# Patient Record
Sex: Female | Born: 1950
Health system: Southern US, Community
[De-identification: ages and names within clinical notes are randomized; demographics above are authoritative.]

## PROBLEM LIST (undated history)

## (undated) DIAGNOSIS — M170 Bilateral primary osteoarthritis of knee: Secondary | ICD-10-CM

## (undated) DIAGNOSIS — N200 Calculus of kidney: Secondary | ICD-10-CM

## (undated) DIAGNOSIS — Z8719 Personal history of other diseases of the digestive system: Secondary | ICD-10-CM

## (undated) DIAGNOSIS — F32A Depression, unspecified: Secondary | ICD-10-CM

## (undated) DIAGNOSIS — Z800 Family history of malignant neoplasm of digestive organs: Secondary | ICD-10-CM

## (undated) DIAGNOSIS — M1990 Unspecified osteoarthritis, unspecified site: Secondary | ICD-10-CM

## (undated) DIAGNOSIS — E785 Hyperlipidemia, unspecified: Secondary | ICD-10-CM

## (undated) DIAGNOSIS — M8580 Other specified disorders of bone density and structure, unspecified site: Secondary | ICD-10-CM

## (undated) DIAGNOSIS — M5136 Other intervertebral disc degeneration, lumbar region: Secondary | ICD-10-CM

## (undated) DIAGNOSIS — J309 Allergic rhinitis, unspecified: Secondary | ICD-10-CM

## (undated) DIAGNOSIS — Z8711 Personal history of peptic ulcer disease: Secondary | ICD-10-CM

## (undated) DIAGNOSIS — F329 Major depressive disorder, single episode, unspecified: Secondary | ICD-10-CM

## (undated) DIAGNOSIS — M5030 Other cervical disc degeneration, unspecified cervical region: Secondary | ICD-10-CM

## (undated) DIAGNOSIS — R7303 Prediabetes: Secondary | ICD-10-CM

## (undated) DIAGNOSIS — R569 Unspecified convulsions: Secondary | ICD-10-CM

## (undated) DIAGNOSIS — E559 Vitamin D deficiency, unspecified: Secondary | ICD-10-CM

## (undated) DIAGNOSIS — T7840XA Allergy, unspecified, initial encounter: Secondary | ICD-10-CM

## (undated) DIAGNOSIS — D126 Benign neoplasm of colon, unspecified: Secondary | ICD-10-CM

## (undated) DIAGNOSIS — Z808 Family history of malignant neoplasm of other organs or systems: Secondary | ICD-10-CM

## (undated) DIAGNOSIS — G43909 Migraine, unspecified, not intractable, without status migrainosus: Secondary | ICD-10-CM

## (undated) DIAGNOSIS — K3532 Acute appendicitis with perforation and localized peritonitis, without abscess: Secondary | ICD-10-CM

## (undated) DIAGNOSIS — F17200 Nicotine dependence, unspecified, uncomplicated: Secondary | ICD-10-CM

## (undated) DIAGNOSIS — K219 Gastro-esophageal reflux disease without esophagitis: Secondary | ICD-10-CM

## (undated) DIAGNOSIS — M51369 Other intervertebral disc degeneration, lumbar region without mention of lumbar back pain or lower extremity pain: Secondary | ICD-10-CM

## (undated) DIAGNOSIS — C3490 Malignant neoplasm of unspecified part of unspecified bronchus or lung: Secondary | ICD-10-CM

## (undated) DIAGNOSIS — I2510 Atherosclerotic heart disease of native coronary artery without angina pectoris: Secondary | ICD-10-CM

## (undated) DIAGNOSIS — J449 Chronic obstructive pulmonary disease, unspecified: Secondary | ICD-10-CM

## (undated) DIAGNOSIS — J189 Pneumonia, unspecified organism: Secondary | ICD-10-CM

## (undated) DIAGNOSIS — D649 Anemia, unspecified: Secondary | ICD-10-CM

## (undated) DIAGNOSIS — Z8489 Family history of other specified conditions: Secondary | ICD-10-CM

## (undated) DIAGNOSIS — J42 Unspecified chronic bronchitis: Secondary | ICD-10-CM

## (undated) DIAGNOSIS — Z803 Family history of malignant neoplasm of breast: Secondary | ICD-10-CM

## (undated) HISTORY — DX: Allergy, unspecified, initial encounter: T78.40XA

## (undated) HISTORY — PX: POLYPECTOMY: SHX149

## (undated) HISTORY — DX: Major depressive disorder, single episode, unspecified: F32.9

## (undated) HISTORY — DX: Allergic rhinitis, unspecified: J30.9

## (undated) HISTORY — DX: Benign neoplasm of colon, unspecified: D12.6

## (undated) HISTORY — DX: Family history of malignant neoplasm of breast: Z80.3

## (undated) HISTORY — DX: Hyperlipidemia, unspecified: E78.5

## (undated) HISTORY — DX: Unspecified osteoarthritis, unspecified site: M19.90

## (undated) HISTORY — DX: Family history of malignant neoplasm of other organs or systems: Z80.8

## (undated) HISTORY — DX: Anemia, unspecified: D64.9

## (undated) HISTORY — DX: Other cervical disc degeneration, unspecified cervical region: M50.30

## (undated) HISTORY — DX: Acute appendicitis with perforation and localized peritonitis, without abscess: K35.32

## (undated) HISTORY — PX: COLONOSCOPY: SHX174

## (undated) HISTORY — DX: Family history of malignant neoplasm of digestive organs: Z80.0

## (undated) HISTORY — DX: Other intervertebral disc degeneration, lumbar region without mention of lumbar back pain or lower extremity pain: M51.369

## (undated) HISTORY — DX: Chronic obstructive pulmonary disease, unspecified: J44.9

## (undated) HISTORY — DX: Other specified disorders of bone density and structure, unspecified site: M85.80

## (undated) HISTORY — DX: Nicotine dependence, unspecified, uncomplicated: F17.200

## (undated) HISTORY — DX: Vitamin D deficiency, unspecified: E55.9

## (undated) HISTORY — DX: Depression, unspecified: F32.A

## (undated) HISTORY — DX: Other intervertebral disc degeneration, lumbar region: M51.36

## (undated) HISTORY — DX: Prediabetes: R73.03

## (undated) HISTORY — PX: APPENDECTOMY: SHX54

---

## 1976-07-28 HISTORY — PX: VAGINAL HYSTERECTOMY: SUR661

## 1997-07-28 HISTORY — PX: SALPINGOOPHORECTOMY: SHX82

## 1999-05-07 ENCOUNTER — Other Ambulatory Visit: Admission: RE | Admit: 1999-05-07 | Discharge: 1999-05-07 | Payer: Self-pay | Admitting: *Deleted

## 2000-05-25 ENCOUNTER — Other Ambulatory Visit: Admission: RE | Admit: 2000-05-25 | Discharge: 2000-05-25 | Payer: Self-pay | Admitting: *Deleted

## 2001-05-31 ENCOUNTER — Other Ambulatory Visit: Admission: RE | Admit: 2001-05-31 | Discharge: 2001-05-31 | Payer: Self-pay | Admitting: *Deleted

## 2001-07-28 HISTORY — PX: SHOULDER ARTHROSCOPY: SHX128

## 2002-06-07 ENCOUNTER — Other Ambulatory Visit: Admission: RE | Admit: 2002-06-07 | Discharge: 2002-06-07 | Payer: Self-pay | Admitting: *Deleted

## 2003-06-06 ENCOUNTER — Other Ambulatory Visit: Admission: RE | Admit: 2003-06-06 | Discharge: 2003-06-06 | Payer: Self-pay | Admitting: *Deleted

## 2004-07-08 ENCOUNTER — Other Ambulatory Visit: Admission: RE | Admit: 2004-07-08 | Discharge: 2004-07-08 | Payer: Self-pay | Admitting: *Deleted

## 2004-11-05 ENCOUNTER — Ambulatory Visit: Payer: Self-pay | Admitting: Gastroenterology

## 2004-11-06 ENCOUNTER — Ambulatory Visit: Payer: Self-pay | Admitting: Gastroenterology

## 2005-08-20 ENCOUNTER — Other Ambulatory Visit: Admission: RE | Admit: 2005-08-20 | Discharge: 2005-08-20 | Payer: Self-pay | Admitting: *Deleted

## 2006-09-01 ENCOUNTER — Other Ambulatory Visit: Admission: RE | Admit: 2006-09-01 | Discharge: 2006-09-01 | Payer: Self-pay | Admitting: *Deleted

## 2007-11-01 ENCOUNTER — Ambulatory Visit (HOSPITAL_COMMUNITY): Admission: RE | Admit: 2007-11-01 | Discharge: 2007-11-01 | Payer: Self-pay | Admitting: Internal Medicine

## 2007-12-07 ENCOUNTER — Other Ambulatory Visit: Admission: RE | Admit: 2007-12-07 | Discharge: 2007-12-07 | Payer: Self-pay | Admitting: Gynecology

## 2008-10-31 ENCOUNTER — Encounter: Admission: RE | Admit: 2008-10-31 | Discharge: 2008-10-31 | Payer: Self-pay | Admitting: Internal Medicine

## 2009-10-03 ENCOUNTER — Encounter (INDEPENDENT_AMBULATORY_CARE_PROVIDER_SITE_OTHER): Payer: Self-pay | Admitting: *Deleted

## 2009-10-26 HISTORY — PX: TENDON REPAIR: SHX5111

## 2010-03-27 ENCOUNTER — Ambulatory Visit (HOSPITAL_COMMUNITY): Admission: RE | Admit: 2010-03-27 | Discharge: 2010-03-27 | Payer: Self-pay | Admitting: Internal Medicine

## 2010-06-26 ENCOUNTER — Ambulatory Visit (HOSPITAL_COMMUNITY)
Admission: RE | Admit: 2010-06-26 | Discharge: 2010-06-26 | Payer: Self-pay | Source: Home / Self Care | Admitting: Internal Medicine

## 2010-07-28 HISTORY — PX: SHOULDER ARTHROSCOPY W/ ROTATOR CUFF REPAIR: SHX2400

## 2010-08-27 NOTE — Letter (Signed)
Summary: Colonoscopy Date Change Letter  [facility]  [address] [place], [place] [address]   Phone: [phone]  Fax: [phone]      October 03, 2009 MRN: [id number]   [facility] [address] [place], [place]  [address]   Dear Ms. Raffel,   Previously you were recommended to have a repeat colonoscopy around this time. Your chart was recently reviewed by Dr. Sheryn Bison of [facility]. Follow up colonoscopy is now recommended in __. This revised recommendation is based on current, nationally recognized guidelines for colorectal cancer screening and polyp surveillance. These guidelines are endorsed by the American Cancer Society, [employer] on Colorectal Cancer as well as numerous other major medical organizations.  Please understand that our recommendation assumes that you do not have any new symptoms such as bleeding, a change in bowel habits, anemia, or significant abdominal discomfort. If you do have any concerning GI symptoms or want to discuss the guideline recommendations, please call to arrange an office visit at your earliest convenience. Otherwise we will keep you in our reminder system and contact you 1-2 months prior to the date listed above to schedule your next colonoscopy.  Thank you,   Vania Rea. Jarold Motto, M.D.  [facility] [phone]

## 2010-08-27 NOTE — Letter (Signed)
Summary: Colonoscopy Date Change Letter  [facility]  [address] [place], [place] [address]   Phone: [phone]  Fax: [phone]      October 03, 2009 MRN: [id number]   [facility] [address] [place], [place]  [address]   Dear [name],   Previously you were recommended to have a repeat colonoscopy around this time. Your chart was recently reviewed by Dr. Jarold Motto of [facility]. Follow up colonoscopy is now recommended in April 2016. This revised recommendation is based on current, nationally recognized guidelines for colorectal cancer screening and polyp surveillance. These guidelines are endorsed by the American Cancer Society, [employer] on Colorectal Cancer as well as numerous other major medical organizations.  Please understand that our recommendation assumes that you do not have any new symptoms such as bleeding, a change in bowel habits, anemia, or significant abdominal discomfort. If you do have any concerning GI symptoms or want to discuss the guideline recommendations, please call to arrange an office visit at your earliest convenience. Otherwise we will keep you in our reminder system and contact you 1-2 months prior to the date listed above to schedule your next colonoscopy.  Thank you,   Karen Barrera. Jarold Motto, M.D.  [facility] [phone]

## 2011-04-29 ENCOUNTER — Other Ambulatory Visit: Payer: Self-pay | Admitting: Physician Assistant

## 2012-04-20 ENCOUNTER — Ambulatory Visit (HOSPITAL_COMMUNITY)
Admission: RE | Admit: 2012-04-20 | Discharge: 2012-04-20 | Disposition: A | Discharge status: Home / Self Care | Payer: 59 | Source: Ambulatory Visit | Attending: Internal Medicine | Admitting: Internal Medicine

## 2012-04-20 ENCOUNTER — Other Ambulatory Visit (HOSPITAL_COMMUNITY): Payer: Self-pay | Admitting: Internal Medicine

## 2012-04-20 DIAGNOSIS — R059 Cough, unspecified: Secondary | ICD-10-CM

## 2012-04-20 DIAGNOSIS — R0989 Other specified symptoms and signs involving the circulatory and respiratory systems: Secondary | ICD-10-CM | POA: Insufficient documentation

## 2012-04-20 DIAGNOSIS — R05 Cough: Secondary | ICD-10-CM

## 2012-04-21 ENCOUNTER — Telehealth: Payer: Self-pay | Admitting: Gastroenterology

## 2012-04-21 NOTE — Telephone Encounter (Signed)
This call was about her husband, pt Karen Barrera, [phone].

## 2012-05-03 ENCOUNTER — Telehealth: Payer: Self-pay | Admitting: Gastroenterology

## 2012-05-03 NOTE — Telephone Encounter (Signed)
Patient had colonoscopy in 11/06/2004 had multiple hyperplastic plastic. She does have a family history of colon cancer, but it was in a great aunt and great uncle.  She has family history or colon polyps but not sure what type. Please review colon and path in your folder and advise proper recall date.

## 2012-05-05 NOTE — Telephone Encounter (Signed)
Colon now is in order

## 2012-05-05 NOTE — Telephone Encounter (Signed)
Please schedule colon per Dr. Jarold Motto.

## 2012-05-10 ENCOUNTER — Encounter: Payer: Self-pay | Admitting: Gastroenterology

## 2012-05-11 ENCOUNTER — Encounter: Payer: Self-pay | Admitting: Gastroenterology

## 2012-06-14 ENCOUNTER — Encounter: Payer: Self-pay | Admitting: Gastroenterology

## 2012-06-14 ENCOUNTER — Ambulatory Visit (AMBULATORY_SURGERY_CENTER): Payer: 59 | Admitting: *Deleted

## 2012-06-14 VITALS — Ht 64.75 in | Wt 159.0 lb

## 2012-06-14 DIAGNOSIS — Z1211 Encounter for screening for malignant neoplasm of colon: Secondary | ICD-10-CM

## 2012-06-14 MED ORDER — MOVIPREP 100 G PO SOLR: ORAL | Status: DC

## 2012-06-28 ENCOUNTER — Encounter: Payer: Self-pay | Admitting: Gastroenterology

## 2012-06-28 ENCOUNTER — Ambulatory Visit (AMBULATORY_SURGERY_CENTER): Payer: 59 | Admitting: Gastroenterology

## 2012-06-28 VITALS — BP 108/58 | HR 65 | Temp 97.0°F | Resp 17 | Ht 64.0 in | Wt 159.0 lb

## 2012-06-28 DIAGNOSIS — D126 Benign neoplasm of colon, unspecified: Secondary | ICD-10-CM

## 2012-06-28 DIAGNOSIS — Z1211 Encounter for screening for malignant neoplasm of colon: Secondary | ICD-10-CM

## 2012-06-28 DIAGNOSIS — Z800 Family history of malignant neoplasm of digestive organs: Secondary | ICD-10-CM

## 2012-06-28 LAB — HM COLONOSCOPY

## 2012-06-28 MED ORDER — SODIUM CHLORIDE 0.9 % IV SOLN: 500.0000 mL | INTRAVENOUS | Status: DC

## 2012-06-28 NOTE — Patient Instructions (Addendum)
Multiple polyps found and removed.  Sent to pathology.    YOU HAD AN ENDOSCOPIC PROCEDURE TODAY AT THE [facility]: Refer to the procedure report that was given to you for any specific questions about what was found during the examination.  If the procedure report does not answer your questions, please call your gastroenterologist to clarify.  If you requested that your care partner not be given the details of your procedure findings, then the procedure report has been included in a sealed envelope for you to review at your convenience later.  YOU SHOULD EXPECT: Some feelings of bloating in the abdomen. Passage of more gas than usual.  Walking can help get rid of the air that was put into your GI tract during the procedure and reduce the bloating. If you had a lower endoscopy (such as a colonoscopy or flexible sigmoidoscopy) you may notice spotting of blood in your stool or on the toilet paper. If you underwent a bowel prep for your procedure, then you may not have a normal bowel movement for a few days.  DIET: Your first meal following the procedure should be a light meal and then it is ok to progress to your normal diet.  A half-sandwich or bowl of soup is an example of a good first meal.  Heavy or fried foods are harder to digest and may make you feel nauseous or bloated.  Likewise meals heavy in dairy and vegetables can cause extra gas to form and this can also increase the bloating.  Drink plenty of fluids but you should avoid alcoholic beverages for 24 hours.  ACTIVITY: Your care partner should take you home directly after the procedure.  You should plan to take it easy, moving slowly for the rest of the day.  You can resume normal activity the day after the procedure however you should NOT DRIVE or use heavy machinery for 24 hours (because of the sedation medicines used during the test).    SYMPTOMS TO REPORT IMMEDIATELY: A gastroenterologist can be reached at any hour.  During normal  business hours, 8:30 AM to 5:00 PM Monday through Friday, call [phone].  After hours and on weekends, please call the GI answering service at [phone] who will take a message and have the physician on call contact you.   Following lower endoscopy (colonoscopy or flexible sigmoidoscopy):  Excessive amounts of blood in the stool  Significant tenderness or worsening of abdominal pains  Swelling of the abdomen that is new, acute  Fever of 100F or higher   FOLLOW UP: If any biopsies were taken you will be contacted by phone or by letter within the next 1-3 weeks.  Call your gastroenterologist if you have not heard about the biopsies in 3 weeks.  Our staff will call the home number listed on your records the next business day following your procedure to check on you and address any questions or concerns that you may have at that time regarding the information given to you following your procedure. This is a courtesy call and so if there is no answer at the home number and we have not heard from you through the emergency physician on call, we will assume that you have returned to your regular daily activities without incident.  SIGNATURES/CONFIDENTIALITY: You and/or your care partner have signed paperwork which will be entered into your electronic medical record.  These signatures attest to the fact that that the information above on your After Visit Summary has  been reviewed and is understood.  Full responsibility of the confidentiality of this discharge information lies with you and/or your care-partner.  

## 2012-06-28 NOTE — Op Note (Signed)
[facility] [address] [place], [address]   COLONOSCOPY PROCEDURE REPORT  PATIENT: Shareka, Casale  MR#: [id number] BIRTHDATE: 1950-11-12 , 60  yrs. old GENDER: Female ENDOSCOPIST: Mardella Layman, MD, [facility] REFERRED BY: PROCEDURE DATE:  06/28/2012 PROCEDURE:   Colonoscopy with snare polypectomy ASA CLASS:   Class II INDICATIONS:Patient's family history of colon cancer, distant relatives. MEDICATIONS: propofol (Diprivan) 250mg  IV  DESCRIPTION OF PROCEDURE:   After the risks and benefits and of the procedure were explained, informed consent was obtained.  A digital rectal exam revealed no abnormalities of the rectum.    The LB [id number]  endoscope was introduced through the anus and advanced to the cecum, which was identified by both the appendix and ileocecal valve .  The quality of the prep was adequate, using MoviPrep .  The instrument was then slowly withdrawn as the colon was fully examined.     COLON FINDINGS: Multiple smooth sessile polyps ranging between 3-2mm in size were found throughout the entire examined colon.  A polypectomy was performed using snare cautery.  The resection was complete and the polyp tissue was completely retrieved.   The colon mucosa was otherwise normal.Prominant perianal papillae noted in retroflexed view.           The scope was then withdrawn from the patient and the procedure completed.  COMPLICATIONS: There were no complications. ENDOSCOPIC IMPRESSION: 1.   Multiple sessile polyps ranging between 3-38mm in size were found throughout the entire examined colon; polypectomy was performed using snare cautery 2.   The colon mucosa was otherwise normal  RECOMMENDATIONS: If the polyp(s) removed today are proven to be adenomatous (pre-cancerous) polyps, you will need a colonoscopy in 3 years. Otherwise you should continue to follow colorectal cancer screening guidelines for "routine risk" patients with a  colonoscopy in 10 years.  You will receive a letter within 1-2 weeks with the results of your biopsy as well as final recommendations.  Please call my office if you have not received a letter after 3 weeks.   REPEAT EXAM:  cc:  _______________________________ eSignedMardella Layman, MD, [facility] 06/28/2012 10:32 AM     PATIENT NAME:  Khylah, Kendra MR#: [id number]

## 2012-06-28 NOTE — Progress Notes (Signed)
Patient did not experience any of the following events: a burn prior to discharge; a fall within the facility; wrong site/side/patient/procedure/implant event; or a hospital transfer or hospital admission upon discharge from the facility. (G8907) Patient did not have preoperative order for IV antibiotic SSI prophylaxis. (G8918)  

## 2012-06-29 ENCOUNTER — Telehealth: Payer: Self-pay | Admitting: *Deleted

## 2012-06-29 NOTE — Telephone Encounter (Signed)
Message left

## 2012-07-02 ENCOUNTER — Encounter: Payer: Self-pay | Admitting: Gastroenterology

## 2012-07-05 LAB — HM MAMMOGRAPHY: HM Mammogram: NORMAL

## 2013-03-30 ENCOUNTER — Other Ambulatory Visit: Payer: Self-pay | Admitting: Physician Assistant

## 2013-06-15 ENCOUNTER — Other Ambulatory Visit: Payer: Self-pay

## 2013-06-15 DIAGNOSIS — R0602 Shortness of breath: Secondary | ICD-10-CM

## 2013-06-15 MED ORDER — ALBUTEROL SULFATE HFA 108 (90 BASE) MCG/ACT IN AERS: 2.0000 | INHALATION_SPRAY | RESPIRATORY_TRACT | Status: DC | PRN

## 2013-06-16 ENCOUNTER — Other Ambulatory Visit: Payer: 59

## 2013-06-16 ENCOUNTER — Ambulatory Visit (INDEPENDENT_AMBULATORY_CARE_PROVIDER_SITE_OTHER): Payer: 59 | Admitting: Internal Medicine

## 2013-06-16 ENCOUNTER — Encounter: Payer: Self-pay | Admitting: Internal Medicine

## 2013-06-16 ENCOUNTER — Ambulatory Visit (INDEPENDENT_AMBULATORY_CARE_PROVIDER_SITE_OTHER)
Admission: RE | Admit: 2013-06-16 | Discharge: 2013-06-16 | Disposition: A | Discharge status: Home / Self Care | Payer: 59 | Source: Ambulatory Visit | Attending: Internal Medicine | Admitting: Internal Medicine

## 2013-06-16 VITALS — BP 126/76 | HR 86 | Ht 64.25 in | Wt 167.2 lb

## 2013-06-16 DIAGNOSIS — R05 Cough: Secondary | ICD-10-CM

## 2013-06-16 DIAGNOSIS — R053 Chronic cough: Secondary | ICD-10-CM

## 2013-06-16 DIAGNOSIS — J302 Other seasonal allergic rhinitis: Secondary | ICD-10-CM

## 2013-06-16 DIAGNOSIS — R059 Cough, unspecified: Secondary | ICD-10-CM

## 2013-06-16 DIAGNOSIS — J3089 Other allergic rhinitis: Secondary | ICD-10-CM

## 2013-06-16 DIAGNOSIS — J309 Allergic rhinitis, unspecified: Secondary | ICD-10-CM

## 2013-06-16 DIAGNOSIS — J209 Acute bronchitis, unspecified: Secondary | ICD-10-CM

## 2013-06-16 NOTE — Progress Notes (Signed)
06/16/13- 43 yoF smoker referred courtesy of Dr McKeown/  Loree Fee, PA for chronic allergies.  Pt c/o congestion, intermittent sometimes prod cough with clear/yellow mucus. She describes "allergy" as rhinitis and chest congestion each year around Christmas and Easter( winter and spring). History of seasonal allergic rhinitis with itching and some sneezing each spring and fall. Denies history of asthma. Needed to see her primary physician 4 times in September. Eyes have been watering, nose running, intermittent cough sometimes productive of white or yellow sputum. Occasional mild wheeze. History of deviated septum repair.  She says when she has tried quitting smoking before she gets mood changes and weight gain. Now averaging one half pack per day. History of pneumonia with pneumonia vaccine twice. Rescue inhaler has been some help if acutely ill. She denies reflux symptoms. Family history of uncle and grand daughter with asthma. She is married, retired from an office job with some travel. CXR 04/20/12 IMPRESSION:  Stable exam. No active cardiopulmonary disease.  Original Report Authenticated By: Danae Orleans, M.D.   Prior to Admission medications   Medication Sig Start Date End Date Taking? Authorizing Provider  albuterol (PROAIR HFA) 108 (90 BASE) MCG/ACT inhaler Inhale 2 puffs into the lungs every 4 (four) hours as needed for wheezing or shortness of breath. 06/15/13  Yes Loree Fee, MD  B Complex-C (SUPER B COMPLEX PO) Take by mouth daily.   Yes Historical Provider, MD  Calcium Carbonate-Vit D-Min (GNP CALCIUM 1200 PO) Take by mouth daily.   Yes Historical Provider, MD  Cholecalciferol (VITAMIN D-3) 5000 UNITS TABS Take by mouth daily.   Yes Historical Provider, MD  co-enzyme Q-10 30 MG capsule Take 30 mg by mouth daily. Takes 200 mg daily   Yes Historical Provider, MD  estradiol (VIVELLE-DOT) 0.1 MG/24HR Place 1 patch onto the skin.   Yes Historical Provider, MD  Magnesium 300 MG CAPS  Take by mouth daily.   Yes Historical Provider, MD  montelukast (SINGULAIR) 10 MG tablet Take 10 mg by mouth at bedtime.   Yes Historical Provider, MD  rosuvastatin (CRESTOR) 5 MG tablet Take 5 mg by mouth daily.   Yes Historical Provider, MD   Past Medical History  Diagnosis Date  . Allergy   . Arthritis   . Depression   . Hyperlipidemia   . Osteopenia    Past Surgical History  Procedure Laterality Date  . Shoulder surgery  2012    left; rotator cuff  . Elbow surgery  10/2009    left/torn tendon  . Shoulder surgery  2003    right; removal spurs  . Oophorectomy  1999    right  . Partial vaginal hysterectomy  1978   Family History  Problem Relation Age of Onset  . Stomach cancer Neg Hx   . Colon cancer Other 60  . Colon cancer Other 60   History   Social History  . Marital Status: Married    Spouse Name: N/A    Number of Children: N/A  . Years of Education: N/A   Occupational History  . Not on file.   Social History Main Topics  . Smoking status: Current Every Day Smoker -- 0.50 packs/day for 40 years    Types: Cigarettes  . Smokeless tobacco: Never Used     Comment: Pt quit smoking during pregnancies, also 3 yrs ago for 6 mos.  . Alcohol Use: No  . Drug Use: No  . Sexual Activity: Not on file   Other Topics Concern  .  Not on file   Social History Narrative  . No narrative on file   ROS-see HPI Constitutional:   No-   weight loss, night sweats, fevers, chills, fatigue, lassitude. HEENT:   No-  headaches, difficulty swallowing, tooth/dental problems, sore throat,       +sneezing,no- itching, ear ache, nasal congestion, post nasal drip,  CV:  No-   chest pain, orthopnea, PND, swelling in lower extremities, anasarca,  dizziness, palpitations Resp: No-   shortness of breath with exertion or at rest.              +productive cough,  +non-productive cough,  No- coughing up of blood.              No-   change in color of mucus.  No- wheezing.   Skin: No-   rash  or lesions. GI:  No-   heartburn, indigestion, abdominal pain, nausea, vomiting, diarrhea,                 change in bowel habits, loss of appetite GU: No-   dysuria, change in color of urine, no urgency or frequency.  No- flank pain. MS:  No-   joint pain or swelling.  No- decreased range of motion.  No- back pain. Neuro-     nothing unusual Psych:  No- change in mood or affect. No depression or anxiety.  No memory loss.  OBJ- Physical Exam General- Alert, Oriented, Affect-appropriate, Distress- none acute Skin- rash-none, lesions- none, excoriation- none Lymphadenopathy- none Head- atraumatic            Eyes- Gross vision intact, PERRLA, conjunctivae and secretions clear            Ears- Hearing, canals-normal            Nose- Clear, no-Septal dev, mucus, polyps, erosion, perforation             Throat- Mallampati II , mucosa clear , drainage- none, tonsils- atrophic Neck- flexible , trachea midline, no stridor , thyroid nl, carotid no bruit Chest - symmetrical excursion , unlabored           Heart/CV- RRR , no murmur , no gallop  , no rub, nl s1 s2                           - JVD- none , edema- none, stasis changes- none, varices- none           Lung- clear to P&A, wheeze- none, cough- none , dullness-none, rub- none           Chest wall-  Abd- tender-no, distended-no, bowel sounds-present, HSM- no Br/ Gen/ Rectal- Not done, not indicated Extrem- cyanosis- none, clubbing, none, atrophy- none, strength- nl Neuro- grossly intact to observation

## 2013-06-16 NOTE — Patient Instructions (Signed)
Order- CXR                                Dx chronic cough, tobacco user  Order- schedule PFT, 6 MWT  Order- Lab- Allergy Profile     Please get serious about stopping smoking

## 2013-06-17 LAB — ALLERGY FULL PROFILE
Allergen, D pternoyssinus,d7: 0.69 kU/L — ABNORMAL HIGH
Allergen,Goose feathers, e70: <0.1 kU/L
Alternaria Alternata: <0.1 kU/L
Aspergillus fumigatus, m3: <0.1 kU/L
Bahia Grass: <0.1 kU/L
Bermuda Grass: 0.54 kU/L — ABNORMAL HIGH
Box Elder IgE: <0.1 kU/L
Candida Albicans: <0.1 kU/L
Cat Dander: <0.1 kU/L
Common Ragweed: 0.1 kU/L — ABNORMAL HIGH
Curvularia lunata: <0.1 kU/L
D. farinae: 0.64 kU/L — ABNORMAL HIGH
Dog Dander: 0.23 kU/L — ABNORMAL HIGH
Elm IgE: <0.1 kU/L
Fescue: <0.1 kU/L
G005 Rye, Perennial: <0.1 kU/L
G009 Red Top: <0.1 kU/L
Goldenrod: <0.1 kU/L
Helminthosporium halodes: <0.1 kU/L
House Dust Hollister: 0.25 kU/L — ABNORMAL HIGH
IgE (Immunoglobulin E), Serum: 41.7 IU/mL (ref 0.0–180.0)
Lamb's Quarters: <0.1 kU/L
Oak: <0.1 kU/L
Plantain: <0.1 kU/L
Stemphylium Botryosum: <0.1 kU/L
Sycamore Tree: <0.1 kU/L
Timothy Grass: <0.1 kU/L

## 2013-07-03 DIAGNOSIS — J3089 Other allergic rhinitis: Secondary | ICD-10-CM | POA: Insufficient documentation

## 2013-07-03 DIAGNOSIS — J302 Other seasonal allergic rhinitis: Secondary | ICD-10-CM | POA: Insufficient documentation

## 2013-07-03 NOTE — Assessment & Plan Note (Signed)
Plan-allergy profile 

## 2013-07-03 NOTE — Assessment & Plan Note (Addendum)
Pattern sounds like recurrent acute bronchitis and rhinitis. There may be a mixture of infections and allergy triggers. Ongoing smoking  is clearly an aggravating factor. Plan-chest x-ray, PFT, allergy profile

## 2013-07-07 ENCOUNTER — Encounter: Payer: Self-pay | Admitting: Internal Medicine

## 2013-07-11 ENCOUNTER — Ambulatory Visit (INDEPENDENT_AMBULATORY_CARE_PROVIDER_SITE_OTHER): Payer: 59 | Admitting: Emergency Medicine

## 2013-07-11 ENCOUNTER — Encounter: Payer: Self-pay | Admitting: Emergency Medicine

## 2013-07-11 VITALS — BP 114/80 | HR 68 | Temp 98.2°F | Resp 16 | Ht 64.75 in | Wt 166.0 lb

## 2013-07-11 DIAGNOSIS — R7309 Other abnormal glucose: Secondary | ICD-10-CM | POA: Insufficient documentation

## 2013-07-11 DIAGNOSIS — T7840XA Allergy, unspecified, initial encounter: Secondary | ICD-10-CM | POA: Insufficient documentation

## 2013-07-11 DIAGNOSIS — F32A Depression, unspecified: Secondary | ICD-10-CM | POA: Insufficient documentation

## 2013-07-11 DIAGNOSIS — T7840XS Allergy, unspecified, sequela: Secondary | ICD-10-CM

## 2013-07-11 DIAGNOSIS — E785 Hyperlipidemia, unspecified: Secondary | ICD-10-CM | POA: Insufficient documentation

## 2013-07-11 DIAGNOSIS — E559 Vitamin D deficiency, unspecified: Secondary | ICD-10-CM | POA: Insufficient documentation

## 2013-07-11 DIAGNOSIS — F17200 Nicotine dependence, unspecified, uncomplicated: Secondary | ICD-10-CM

## 2013-07-11 DIAGNOSIS — Z87891 Personal history of nicotine dependence: Secondary | ICD-10-CM | POA: Insufficient documentation

## 2013-07-11 DIAGNOSIS — D649 Anemia, unspecified: Secondary | ICD-10-CM | POA: Insufficient documentation

## 2013-07-11 DIAGNOSIS — F329 Major depressive disorder, single episode, unspecified: Secondary | ICD-10-CM | POA: Insufficient documentation

## 2013-07-11 DIAGNOSIS — E782 Mixed hyperlipidemia: Secondary | ICD-10-CM

## 2013-07-11 DIAGNOSIS — IMO0002 Reserved for concepts with insufficient information to code with codable children: Secondary | ICD-10-CM | POA: Insufficient documentation

## 2013-07-11 DIAGNOSIS — R7303 Prediabetes: Secondary | ICD-10-CM

## 2013-07-11 LAB — CBC WITH DIFFERENTIAL/PLATELET
Basophils Absolute: 0.1 10*3/uL (ref 0.0–0.1)
Basophils Relative: 1 % (ref 0–1)
Eosinophils Absolute: 0.1 10*3/uL (ref 0.0–0.7)
Eosinophils Relative: 2 % (ref 0–5)
HCT: 42.4 % (ref 36.0–46.0)
Hemoglobin: 14.3 g/dL (ref 12.0–15.0)
Lymphocytes Relative: 29 % (ref 12–46)
Lymphs Abs: 2.1 10*3/uL (ref 0.7–4.0)
MCH: 30.4 pg (ref 26.0–34.0)
MCHC: 33.7 g/dL (ref 30.0–36.0)
MCV: 90.2 fL (ref 78.0–100.0)
Monocytes Absolute: 0.5 10*3/uL (ref 0.1–1.0)
Monocytes Relative: 7 % (ref 3–12)
Neutro Abs: 4.3 10*3/uL (ref 1.7–7.7)
Neutrophils Relative %: 61 % (ref 43–77)
Platelets: 222 10*3/uL (ref 150–400)
RBC: 4.7 MIL/uL (ref 3.87–5.11)
RDW: 14.2 % (ref 11.5–15.5)
WBC: 7 10*3/uL (ref 4.0–10.5)

## 2013-07-11 LAB — HEPATIC FUNCTION PANEL
ALT: 17 U/L (ref 0–35)
AST: 19 U/L (ref 0–37)
Albumin: 4.9 g/dL (ref 3.5–5.2)
Alkaline Phosphatase: 66 U/L (ref 39–117)
Bilirubin, Direct: 0.1 mg/dL (ref 0.0–0.3)
Indirect Bilirubin: 0.3 mg/dL (ref 0.0–0.9)
Total Bilirubin: 0.4 mg/dL (ref 0.3–1.2)
Total Protein: 7.2 g/dL (ref 6.0–8.3)

## 2013-07-11 LAB — LIPID PANEL
Cholesterol: 217 mg/dL — ABNORMAL HIGH (ref 0–200)
HDL: 84 mg/dL (ref >39)
LDL Cholesterol: 112 mg/dL — ABNORMAL HIGH (ref 0–99)
Total CHOL/HDL Ratio: 2.6 Ratio
Triglycerides: 103 mg/dL (ref <150)
VLDL: 21 mg/dL (ref 0–40)

## 2013-07-11 LAB — BASIC METABOLIC PANEL WITH GFR
BUN: 15 mg/dL (ref 6–23)
CO2: 25 mEq/L (ref 19–32)
Calcium: 9.6 mg/dL (ref 8.4–10.5)
Chloride: 104 mEq/L (ref 96–112)
Creat: 0.78 mg/dL (ref 0.50–1.10)
GFR, Est African American: >89 mL/min
GFR, Est Non African American: 82 mL/min
Glucose, Bld: 90 mg/dL (ref 70–99)
Potassium: 4.1 mEq/L (ref 3.5–5.3)
Sodium: 138 mEq/L (ref 135–145)

## 2013-07-11 LAB — HEMOGLOBIN A1C
Hgb A1c MFr Bld: 5.9 % — ABNORMAL HIGH (ref <5.7)
Mean Plasma Glucose: 123 mg/dL — ABNORMAL HIGH (ref <117)

## 2013-07-11 NOTE — Progress Notes (Signed)
Subjective:    Patient ID: Karen Barrera, female    DOB: 09/15/1950, 62 y.o.   MRN: [id number]  HPI Comments: 62 yo female presents for 3 month F/U for Cholesterol, Pre-Dm. She has not started exercising AD due to work schedule but notes retires again next week and will restart. She is not eating as healthy. She plans on restarting low fat diet after holidays. sHE HAS GAINED 5 # SINCE LAST OV. LAST LABS T 172 TG 179 H 74 L 62 A1C 6.2  She is being worked up by Dr Maple Hudson for recurrent allergy flares/ sinusitis and chronic cough with Tobacco HX. She notes some days she smokes less than others. She has not tried the helpful tips that have been given to her at past visits.  Hyperlipidemia  Anemia    Current Outpatient Prescriptions on File Prior to Visit  Medication Sig Dispense Refill  . albuterol (PROAIR HFA) 108 (90 BASE) MCG/ACT inhaler Inhale 2 puffs into the lungs every 4 (four) hours as needed for wheezing or shortness of breath.  8.5 g  1  . aspirin 81 MG tablet Take 81 mg by mouth daily.      . B Complex-C (SUPER B COMPLEX PO) Take by mouth daily.      . Calcium Carbonate-Vit D-Min (GNP CALCIUM 1200 PO) Take by mouth daily.      . Cholecalciferol (VITAMIN D-3) 5000 UNITS TABS Take by mouth daily.      Marland Kitchen co-enzyme Q-10 30 MG capsule Take 30 mg by mouth daily. Takes 200 mg daily      . estradiol (VIVELLE-DOT) 0.1 MG/24HR Place 1 patch onto the skin.      . Magnesium 300 MG CAPS Take by mouth daily.      . montelukast (SINGULAIR) 10 MG tablet Take 10 mg by mouth at bedtime.      . rosuvastatin (CRESTOR) 5 MG tablet Take 5 mg by mouth daily.      . Azelastine-Fluticasone (DYMISTA NA) Place into the nose.      . levocetirizine (XYZAL) 5 MG tablet Take 5 mg by mouth every evening.      . meloxicam (MOBIC) 15 MG tablet Take 15 mg by mouth daily.       No current facility-administered medications on file prior to visit.   ALLERGIES Avelox and Chantix  Past Medical History   Diagnosis Date  . Allergy   . Arthritis   . Depression   . Hyperlipidemia   . Osteopenia   . Anemia   . Pre-diabetes   . Vitamin D deficiency   . Tobacco dependence   . Colon polyp   . Allergic rhinitis   . DDD (degenerative disc disease)   . DDD (degenerative disc disease), cervical   . DDD (degenerative disc disease), lumbar      Review of Systems  HENT: Positive for postnasal drip.   Respiratory: Positive for cough.   All other systems reviewed and are negative.    BP 114/80  Pulse 68  Temp(Src) 98.2 F (36.8 C) (Temporal)  Resp 16  Wt 166 lb (75.297 kg)     Objective:   Physical Exam  Nursing note and vitals reviewed. Constitutional: She is oriented to person, place, and time. She appears well-developed and well-nourished. No distress.  HENT:  Head: Normocephalic and atraumatic.  Right Ear: External ear normal.  Left Ear: External ear normal.  Nose: Nose normal.  Mouth/Throat: Oropharynx is clear and moist.  Eyes: Conjunctivae and  EOM are normal.  Neck: Normal range of motion. Neck supple. No JVD present. No thyromegaly present.  Cardiovascular: Normal rate, regular rhythm, normal heart sounds and intact distal pulses.   Pulmonary/Chest: Effort normal.  Distant breath sounds bilaterally  Abdominal: Soft. She exhibits no distension and no mass. There is no tenderness. There is no rebound and no guarding.  Musculoskeletal: Normal range of motion. She exhibits no edema and no tenderness.  Lymphadenopathy:    She has no cervical adenopathy.  Neurological: She is alert and oriented to person, place, and time. No cranial nerve deficit.  Skin: Skin is warm and dry. No rash noted. No erythema. No pallor.  Psychiatric: She has a normal mood and affect. Her behavior is normal. Judgment and thought content normal.          Assessment & Plan:  1. Cholesterol /PreDM  F/u- Recheck Labs, Needs wt loss, better diet, increase cardio. 2. Recurrent Sinus infections/  Allergies- Continue workup with Dr Maple Hudson has Pulm testing scheduled 08/01/13

## 2013-07-11 NOTE — Patient Instructions (Signed)

## 2013-07-12 LAB — INSULIN, FASTING: Insulin fasting, serum: 13 u[IU]/mL (ref 3–28)

## 2013-07-13 ENCOUNTER — Telehealth: Payer: Self-pay | Admitting: *Deleted

## 2013-07-13 MED ORDER — ROSUVASTATIN CALCIUM 10 MG PO TABS: 10.0000 mg | ORAL_TABLET | Freq: Every day | ORAL | Status: DC

## 2013-07-13 NOTE — Telephone Encounter (Signed)
Message copied by Nicholaus Corolla A on Wed Jul 13, 2013  4:05 PM ------      Message from: [place], [place] R      Created: Wed Jul 13, 2013  6:34 AM       Cholesterol, A1c elevated need better/ healthier diet, cardio and weight loss. Continue vitamins and prescriptions same. If does not improve by next OV may need more medicine. Needs 3 month OV recheck       ------

## 2013-07-13 NOTE — Telephone Encounter (Signed)
Spoke with patient about recent lab results and instructions. 

## 2013-08-01 ENCOUNTER — Ambulatory Visit (INDEPENDENT_AMBULATORY_CARE_PROVIDER_SITE_OTHER): Payer: 59 | Admitting: Internal Medicine

## 2013-08-01 ENCOUNTER — Encounter: Payer: Self-pay | Admitting: Internal Medicine

## 2013-08-01 VITALS — BP 122/78 | HR 108 | Ht 64.25 in | Wt 170.0 lb

## 2013-08-01 DIAGNOSIS — J302 Other seasonal allergic rhinitis: Secondary | ICD-10-CM

## 2013-08-01 DIAGNOSIS — J209 Acute bronchitis, unspecified: Secondary | ICD-10-CM

## 2013-08-01 DIAGNOSIS — J3089 Other allergic rhinitis: Secondary | ICD-10-CM

## 2013-08-01 DIAGNOSIS — F17200 Nicotine dependence, unspecified, uncomplicated: Secondary | ICD-10-CM

## 2013-08-01 DIAGNOSIS — J309 Allergic rhinitis, unspecified: Secondary | ICD-10-CM

## 2013-08-01 NOTE — Patient Instructions (Signed)
Order- reschedule PFT and 6 MWT    Dx bronchitis  Ok to try with singulair- take it a week, skip it a week. See if you can tell any difference  We will watch how you do with this spring pollen season. Ok to take an otc antihistamine if needed, like claritin, allegra, zyrtec

## 2013-08-01 NOTE — Progress Notes (Signed)
06/16/13- 63 yoF smoker referred courtesy of Dr McKeown/  Kelby Aline, PA for chronic allergies.  Pt c/o congestion, intermittent sometimes prod cough with clear/yellow mucus. She describes "allergy" as rhinitis and chest congestion each year around Christmas and Easter( winter and spring). History of seasonal allergic rhinitis with itching and some sneezing each spring and fall. Denies history of asthma. Needed to see her primary physician 4 times in September. Eyes have been watering, nose running, intermittent cough sometimes productive of white or yellow sputum. Occasional mild wheeze. History of deviated septum repair.  She says when she has tried quitting smoking before she gets mood changes and weight gain. Now averaging one half pack per day. History of pneumonia with pneumonia vaccine twice. Rescue inhaler has been some help if acutely ill. She denies reflux symptoms. Family history of uncle and grand daughter with asthma. She is married, retired from an office job with some travel. CXR 04/20/12 IMPRESSION:  Stable exam. No active cardiopulmonary disease.  Original Report Authenticated By: Marlaine Hind, M.D.   08/01/13- 63 yo F smoker followed for allergic rhinitis, recurrent acute bronchitis, tobacco use. FOLLOWS FOR: has not had her 6MW test yet; review labs and cxr with patient; slight wheezing at times. Occasional wheeze lying down. Nasal airway has been clear. Expects spring and fall exacerbations Allergy Profile 06/16/13 Total IgE 41.7   Pos dust mite, dog, [place] grass, ragweed CXR 06/16/13 IMPRESSION:  No active cardiopulmonary disease.  Electronically Signed  By: Kathreen Devoid  On: 06/16/2013 16:17  ROS-see HPI Constitutional:   No-   weight loss, night sweats, fevers, chills, fatigue, lassitude. HEENT:   No-  headaches, difficulty swallowing, tooth/dental problems, sore throat,       +sneezing,no- itching, ear ache, nasal congestion, post nasal drip,  CV:  No-   chest  pain, orthopnea, PND, swelling in lower extremities, anasarca,  dizziness, palpitations Resp: No-   shortness of breath with exertion or at rest.              +productive cough,  No-non-productive cough,  No- coughing up of blood.              No-   change in color of mucus.  No- wheezing Skin: No-   rash or lesions. GI:  No-   heartburn, indigestion, abdominal pain, nausea, vomiting,  GU:  MS:  No-   joint pain or swelling.   Neuro-     nothing unusual Psych:  No- change in mood or affect. No depression or anxiety.  No memory loss.  OBJ- Physical Exam General- Alert, Oriented, Affect-appropriate, Distress- none acute.+odor of tobacco Skin- rash-none, lesions- none, excoriation- none Lymphadenopathy- none Head- atraumatic            Eyes- Gross vision intact, PERRLA, conjunctivae and secretions clear            Ears- Hearing, canals-normal            Nose- Clear, no-Septal dev, mucus, polyps, erosion, perforation             Throat- Mallampati II , mucosa clear , drainage- none, tonsils- atrophic Neck- flexible , trachea midline, no stridor , thyroid nl, carotid no bruit Chest - symmetrical excursion , unlabored           Heart/CV- RRR , no murmur , no gallop  , no rub, nl s1 s2                           -  JVD- none , edema- none, stasis changes- none, varices- none           Lung- clear to P&A, wheeze- none, cough- none , dullness-none, rub- none           Chest wall-  Abd-  Br/ Gen/ Rectal- Not done, not indicated Extrem- cyanosis- none, clubbing, none, atrophy- none, strength- nl Neuro- grossly intact to observation

## 2013-08-21 ENCOUNTER — Encounter: Payer: Self-pay | Admitting: Internal Medicine

## 2013-08-21 NOTE — Assessment & Plan Note (Signed)
Karen Barrera to continue Singulair and OTC antihistamine

## 2013-08-21 NOTE — Assessment & Plan Note (Signed)
Smoking cessation discussed 

## 2013-08-23 ENCOUNTER — Encounter: Payer: Self-pay | Admitting: Podiatry

## 2013-08-23 ENCOUNTER — Telehealth: Payer: Self-pay | Admitting: *Deleted

## 2013-08-23 ENCOUNTER — Ambulatory Visit (INDEPENDENT_AMBULATORY_CARE_PROVIDER_SITE_OTHER): Payer: 59 | Admitting: Podiatry

## 2013-08-23 VITALS — BP 120/70 | HR 76 | Resp 17 | Ht 64.0 in | Wt 170.0 lb

## 2013-08-23 DIAGNOSIS — L600 Ingrowing nail: Secondary | ICD-10-CM

## 2013-08-23 MED ORDER — NEOMYCIN-POLYMYXIN-HC 3.5-10000-1 OT SOLN: OTIC | Status: DC

## 2013-08-23 NOTE — Telephone Encounter (Signed)
Pt states the [facility] is out of Cortisporin drops, please call rx to [facility]  [place].  I told pt, [facility] would [facility].

## 2013-08-23 NOTE — Progress Notes (Signed)
Pt states the left 1st toenail has been painful for 6 days at the medial toenail border, but the right is tender also.  Left 1st toenail is encurvated and painful. She denies fever chills nausea vomiting muscle aches and pains.  Objective: Pulses remain palpable to the left foot. She is a sharp incurvated nail margin along the tibial border of the hallux left. There is mild erythema no granulation tissue no drainage or odor.  Assessment: Pain in limb secondary to ingrown nail paronychia hallux left sharp incurvated nail margin.  Plan: Chemical matrixectomy was performed today without complications. She will start soaking on a twice a day basis and Betadine and water. Both oral and written home-going instructions were provided today as well as a prescription for Cortisporin Otic I will followup with her in one week.

## 2013-08-23 NOTE — Progress Notes (Signed)
   Subjective:    Patient ID: Karen Barrera, female    DOB: 1950/08/09, 63 y.o.   MRN: [id number]  HPI    Review of Systems  All other systems reviewed and are negative.       Objective:   Physical Exam        Assessment & Plan:

## 2013-08-23 NOTE — Patient Instructions (Signed)

## 2013-08-23 NOTE — Telephone Encounter (Signed)
Entered in error

## 2013-08-30 ENCOUNTER — Ambulatory Visit (INDEPENDENT_AMBULATORY_CARE_PROVIDER_SITE_OTHER): Payer: 59 | Admitting: Podiatry

## 2013-08-30 ENCOUNTER — Encounter: Payer: Self-pay | Admitting: Podiatry

## 2013-08-30 VITALS — BP 102/62 | HR 82 | Resp 16

## 2013-08-30 DIAGNOSIS — Z9889 Other specified postprocedural states: Secondary | ICD-10-CM

## 2013-08-30 NOTE — Progress Notes (Signed)
She presents today for followup matrixectomy tibial border hallux left. She denies fever chills nausea vomiting muscle aches or pains states she's been soaking in Betadine warm water.  Objective. There is no erythema edema cellulitis drainage or odor.  Assessment: Well-healing surgical toe left.  Plan: Discontinue Betadine start with Epsom salts and water soaks covered in the day and leave open at night continued Cortisporin Otic. Continue to soak on a regular basis until no erythema no pain no drainage and a Band-Aid

## 2013-09-02 ENCOUNTER — Ambulatory Visit (INDEPENDENT_AMBULATORY_CARE_PROVIDER_SITE_OTHER): Payer: 59 | Admitting: Emergency Medicine

## 2013-09-02 ENCOUNTER — Encounter: Payer: Self-pay | Admitting: Emergency Medicine

## 2013-09-02 VITALS — BP 112/70 | HR 68 | Temp 98.2°F | Resp 18 | Ht 64.75 in | Wt 163.0 lb

## 2013-09-02 DIAGNOSIS — R05 Cough: Secondary | ICD-10-CM

## 2013-09-02 DIAGNOSIS — E782 Mixed hyperlipidemia: Secondary | ICD-10-CM

## 2013-09-02 DIAGNOSIS — R059 Cough, unspecified: Secondary | ICD-10-CM

## 2013-09-02 DIAGNOSIS — R6889 Other general symptoms and signs: Secondary | ICD-10-CM

## 2013-09-02 DIAGNOSIS — R7309 Other abnormal glucose: Secondary | ICD-10-CM

## 2013-09-02 LAB — BASIC METABOLIC PANEL WITH GFR
BUN: 14 mg/dL (ref 6–23)
CO2: 25 mEq/L (ref 19–32)
Calcium: 9.8 mg/dL (ref 8.4–10.5)
Chloride: 102 mEq/L (ref 96–112)
Creat: 0.79 mg/dL (ref 0.50–1.10)
GFR, Est African American: >89 mL/min
GFR, Est Non African American: 80 mL/min
Glucose, Bld: 94 mg/dL (ref 70–99)
Potassium: 4.2 mEq/L (ref 3.5–5.3)
Sodium: 137 mEq/L (ref 135–145)

## 2013-09-02 LAB — CBC WITH DIFFERENTIAL/PLATELET
Basophils Absolute: 0.1 10*3/uL (ref 0.0–0.1)
Basophils Relative: 1 % (ref 0–1)
Eosinophils Absolute: 0.2 10*3/uL (ref 0.0–0.7)
Eosinophils Relative: 2 % (ref 0–5)
HCT: 42.6 % (ref 36.0–46.0)
Hemoglobin: 14.8 g/dL (ref 12.0–15.0)
Lymphocytes Relative: 23 % (ref 12–46)
Lymphs Abs: 1.9 10*3/uL (ref 0.7–4.0)
MCH: 30.2 pg (ref 26.0–34.0)
MCHC: 34.7 g/dL (ref 30.0–36.0)
MCV: 86.9 fL (ref 78.0–100.0)
Monocytes Absolute: 0.7 10*3/uL (ref 0.1–1.0)
Monocytes Relative: 9 % (ref 3–12)
Neutro Abs: 5.7 10*3/uL (ref 1.7–7.7)
Neutrophils Relative %: 65 % (ref 43–77)
Platelets: 210 10*3/uL (ref 150–400)
RBC: 4.9 MIL/uL (ref 3.87–5.11)
RDW: 13.8 % (ref 11.5–15.5)
WBC: 8.6 10*3/uL (ref 4.0–10.5)

## 2013-09-02 LAB — HEPATIC FUNCTION PANEL
ALT: 14 U/L (ref 0–35)
AST: 17 U/L (ref 0–37)
Albumin: 4.8 g/dL (ref 3.5–5.2)
Alkaline Phosphatase: 70 U/L (ref 39–117)
Bilirubin, Direct: 0.1 mg/dL (ref 0.0–0.3)
Indirect Bilirubin: 0.4 mg/dL (ref 0.2–1.2)
Total Bilirubin: 0.5 mg/dL (ref 0.2–1.2)
Total Protein: 7.3 g/dL (ref 6.0–8.3)

## 2013-09-02 LAB — LIPID PANEL
Cholesterol: 205 mg/dL — ABNORMAL HIGH (ref 0–200)
HDL: 74 mg/dL (ref >39)
LDL Cholesterol: 109 mg/dL — ABNORMAL HIGH (ref 0–99)
Total CHOL/HDL Ratio: 2.8 Ratio
Triglycerides: 111 mg/dL (ref <150)
VLDL: 22 mg/dL (ref 0–40)

## 2013-09-02 LAB — HEMOGLOBIN A1C
Hgb A1c MFr Bld: 6.2 % — ABNORMAL HIGH (ref <5.7)
Mean Plasma Glucose: 131 mg/dL — ABNORMAL HIGH (ref <117)

## 2013-09-02 MED ORDER — MOXIFLOXACIN HCL 400 MG PO TABS: 400.0000 mg | ORAL_TABLET | Freq: Every day | ORAL | Status: AC

## 2013-09-02 MED ORDER — EPINEPHRINE 0.3 MG/0.3ML IJ SOAJ: 0.3000 mg | Freq: Once | INTRAMUSCULAR | Status: DC

## 2013-09-02 MED ORDER — CEFTRIAXONE SODIUM 1 G IJ SOLR
1.0000 g | Freq: Once | INTRAMUSCULAR | Status: AC
Start: 2013-09-02 — End: 2013-09-02
  Administered 2013-09-02: 1 g via INTRAMUSCULAR

## 2013-09-02 NOTE — Progress Notes (Signed)
Subjective:    Patient ID: Karen Barrera, female    DOB: 09-17-1950, 63 y.o.   MRN: [id number]  HPI Comments: 63 YO female presents for 3 month F/U for Cholesterol, Pre-Dm, D. deficient LAST LABS T 172 TG 179 L 62 A1C 6.2 LAST TUESDAY CORTISONE INJECTION BOTH KNEES. She has not been exercising or working routinely with knee pain. She is not eating healthy, nor has she d/c Tobacco AD.   She has been sick x 4 days with increasing symptoms. She has taken Mucinex/ OTC ALLERGY/ Nasal saline. She notes nasal production with sinus pressure. She thinks cough production is from sinus drainage. She has not been using NEB but occasionally uses inhalers when she feels she needs them.SHe has f/u pulmonology soon for further lung eval.  Cough  Sinusitis Associated symptoms include congestion, coughing and sinus pressure.   Current Outpatient Prescriptions on File Prior to Visit  Medication Sig Dispense Refill  . albuterol (PROAIR HFA) 108 (90 BASE) MCG/ACT inhaler Inhale 2 puffs into the lungs every 4 (four) hours as needed for wheezing or shortness of breath.  8.5 g  1  . aspirin 81 MG tablet Take 81 mg by mouth daily.      . B Complex-C (SUPER B COMPLEX PO) Take by mouth daily.      . Calcium Carbonate-Vit D-Min (GNP CALCIUM 1200 PO) Take by mouth daily.      . Cholecalciferol (VITAMIN D-3) 5000 UNITS TABS Take by mouth daily.      Marland Kitchen co-enzyme Q-10 30 MG capsule Take 30 mg by mouth daily. Takes 200 mg daily      . estradiol (VIVELLE-DOT) 0.1 MG/24HR Place 1 patch onto the skin.      . Magnesium 300 MG CAPS Take by mouth daily.      . montelukast (SINGULAIR) 10 MG tablet Take 10 mg by mouth at bedtime.      Marland Kitchen neomycin-polymyxin-hydrocortisone (CORTISPORIN) otic solution Apply one to two drops to toe after soaking twice daily.  10 mL  0  . rosuvastatin (CRESTOR) 10 MG tablet Take 1 tablet (10 mg total) by mouth daily. Patient takes 1/2  90 tablet  1  . Azelastine-Fluticasone (DYMISTA NA) Place into  the nose.       No current facility-administered medications on file prior to visit.   Allergies  Allergen Reactions  . Avelox [Moxifloxacin Hcl In Nacl] Other (See Comments)    aches  . Chantix [Varenicline]    Past Medical History  Diagnosis Date  . Allergy   . Arthritis   . Depression   . Hyperlipidemia   . Osteopenia   . Anemia   . Pre-diabetes   . Vitamin D deficiency   . Tobacco dependence   . Colon polyp   . Allergic rhinitis   . DDD (degenerative disc disease)   . DDD (degenerative disc disease), cervical   . DDD (degenerative disc disease), lumbar       Review of Systems  HENT: Positive for congestion and sinus pressure.   Respiratory: Positive for cough.   Musculoskeletal: Positive for arthralgias.  All other systems reviewed and are negative.  BP 112/70  Pulse 68  Temp(Src) 98.2 F (36.8 C) (Temporal)  Resp 18  Ht 5' 4.75" (1.645 m)  Wt 163 lb (73.936 kg)  BMI 27.32 kg/m2      Objective:   Physical Exam  Nursing note and vitals reviewed. Constitutional: She is oriented to person, place, and time. She appears well-developed  and well-nourished. No distress.  HENT:  Head: Normocephalic and atraumatic.  Right Ear: External ear normal.  Left Ear: External ear normal.  Nose: Nose normal.  Mouth/Throat: Oropharynx is clear and moist. No oropharyngeal exudate.  Frontal tenderness Maxillary tenderness Cloudy TM's bilaterally Mild post pharynx erythema  Eyes: Conjunctivae and EOM are normal.  Neck: Normal range of motion. Neck supple. No JVD present. No thyromegaly present.  Cardiovascular: Normal rate, regular rhythm, normal heart sounds and intact distal pulses.   Pulmonary/Chest: Effort normal.  Congested cough  Abdominal: Soft. Bowel sounds are normal. She exhibits no distension and no mass. There is no tenderness. There is no rebound and no guarding.  Musculoskeletal: Normal range of motion. She exhibits no edema and no tenderness.   Lymphadenopathy:    She has no cervical adenopathy.  Neurological: She is alert and oriented to person, place, and time. No cranial nerve deficit.  Skin: Skin is warm and dry. No rash noted. No erythema. No pallor.  Psychiatric: She has a normal mood and affect. Her behavior is normal. Judgment and thought content normal.          Assessment & Plan:  1.  3 month F/U for Cholesterol, Pre-Dm, D. Deficient. Needs healthy diet, cardio QD and obtain healthy weight. Check Labs, Check BP if >130/80 call office 2. Vaseline in each nostril/ Mucinex/ ROcephin 1 gm She wants to try Avelox if no relief with Rocephin despite previous concern with ACHES. Continue rest of RX AD,  Restart Allegra, Mucinex QD until d/c Tobacco, keep Pulm F/u

## 2013-09-02 NOTE — Patient Instructions (Addendum)
Cough, Adult  A cough is a reflex. It helps you clear your throat and airways. A cough can help heal your body. A cough can last 2 or 3 weeks (acute) or may last more than 8 weeks (chronic). Some common causes of a cough can include an infection, allergy, or a cold. HOME CARE  Only take medicine as told by your doctor.  If given, take your medicines (antibiotics) as told. Finish them even if you start to feel better.  Use a cold steam vaporizer or humidier in your home. This can help loosen thick spit (secretions).  Sleep so you are almost sitting up (semi-upright). Use pillows to do this. This helps reduce coughing.  Rest as needed.  Stop smoking if you smoke. GET HELP RIGHT AWAY IF:  You have yellowish-white fluid (pus) in your thick spit.  Your cough gets worse.  Your medicine does not reduce coughing, and you are losing sleep.  You cough up blood.  You have trouble breathing.  Your pain gets worse and medicine does not help.  You have a fever. MAKE SURE YOU:   Understand these instructions.  Will watch your condition.  Will get help right away if you are not doing well or get worse. Document Released: 03/27/2011 Document Revised: 10/06/2011 Document Reviewed: 03/27/2011 [employer] Patient Information 2014 [employer]. Allergic Rhinitis Allergic rhinitis is when the mucous membranes in the nose respond to allergens. Allergens are particles in the air that cause your body to have an allergic reaction. This causes you to release allergic antibodies. Through a chain of events, these eventually cause you to release histamine into the blood stream. Although meant to protect the body, it is this release of histamine that causes your discomfort, such as frequent sneezing, congestion, and an itchy, runny nose.  CAUSES  Seasonal allergic rhinitis (hay fever) is caused by pollen allergens that may come from grasses, trees, and weeds. Year-round allergic rhinitis (perennial  allergic rhinitis) is caused by allergens such as house dust mites, pet dander, and mold spores.  SYMPTOMS   Nasal stuffiness (congestion).  Itchy, runny nose with sneezing and tearing of the eyes. DIAGNOSIS  Your health care provider can help you determine the allergen or allergens that trigger your symptoms. If you and your health care provider are unable to determine the allergen, skin or blood testing may be used. TREATMENT  Allergic Rhinitis does not have a cure, but it can be controlled by:  Medicines and allergy shots (immunotherapy).  Avoiding the allergen. Hay fever may often be treated with antihistamines in pill or nasal spray forms. Antihistamines block the effects of histamine. There are over-the-counter medicines that may help with nasal congestion and swelling around the eyes. Check with your health care provider before taking or giving this medicine.  If avoiding the allergen or the medicine prescribed do not work, there are many new medicines your health care provider can prescribe. Stronger medicine may be used if initial measures are ineffective. Desensitizing injections can be used if medicine and avoidance does not work. Desensitization is when a patient is given ongoing shots until the body becomes less sensitive to the allergen. Make sure you follow up with your health care provider if problems continue. HOME CARE INSTRUCTIONS It is not possible to completely avoid allergens, but you can reduce your symptoms by taking steps to limit your exposure to them. It helps to know exactly what you are allergic to so that you can avoid your specific triggers. SEEK MEDICAL   CARE IF:   You have a fever.  You develop a cough that does not stop easily (persistent).  You have shortness of breath.  You start wheezing.  Symptoms interfere with normal daily activities. Document Released: 04/08/2001 Document Revised: 05/04/2013 Document Reviewed: 03/21/2013 [employer] Patient  Information 2014 [employer].  

## 2013-09-03 LAB — INSULIN, FASTING: Insulin fasting, serum: 11 u[IU]/mL (ref 3–28)

## 2013-10-31 ENCOUNTER — Ambulatory Visit (INDEPENDENT_AMBULATORY_CARE_PROVIDER_SITE_OTHER): Payer: 59 | Admitting: Internal Medicine

## 2013-10-31 ENCOUNTER — Encounter: Payer: Self-pay | Admitting: Internal Medicine

## 2013-10-31 VITALS — BP 100/64 | HR 84 | Ht 64.0 in | Wt 167.0 lb

## 2013-10-31 DIAGNOSIS — R0609 Other forms of dyspnea: Secondary | ICD-10-CM

## 2013-10-31 DIAGNOSIS — J209 Acute bronchitis, unspecified: Secondary | ICD-10-CM

## 2013-10-31 DIAGNOSIS — R0989 Other specified symptoms and signs involving the circulatory and respiratory systems: Secondary | ICD-10-CM

## 2013-10-31 DIAGNOSIS — J3089 Other allergic rhinitis: Secondary | ICD-10-CM

## 2013-10-31 DIAGNOSIS — J302 Other seasonal allergic rhinitis: Secondary | ICD-10-CM

## 2013-10-31 DIAGNOSIS — J309 Allergic rhinitis, unspecified: Secondary | ICD-10-CM

## 2013-10-31 DIAGNOSIS — F17200 Nicotine dependence, unspecified, uncomplicated: Secondary | ICD-10-CM

## 2013-10-31 DIAGNOSIS — R0600 Dyspnea, unspecified: Secondary | ICD-10-CM

## 2013-10-31 LAB — PULMONARY FUNCTION TEST
DL/VA % pred: 76 %
DL/VA: 3.67 ml/min/mmHg/L
DLCO unc % pred: 86 %
DLCO unc: 21.05 ml/min/mmHg
FEF 25-75 Post: 2.9 L/sec
FEF 25-75 Pre: 2.46 L/sec
FEF2575-%Change-Post: 18 %
FEF2575-%Pred-Post: 129 %
FEF2575-%Pred-Pre: 109 %
FEV1-%Change-Post: 3 %
FEV1-%Pred-Post: 123 %
FEV1-%Pred-Pre: 119 %
FEV1-Post: 3.08 L
FEV1-Pre: 2.97 L
FEV1FVC-%Change-Post: 3 %
FEV1FVC-%Pred-Pre: 97 %
FEV6-%Change-Post: 0 %
FEV6-%Pred-Post: 125 %
FEV6-%Pred-Pre: 124 %
FEV6-Post: 3.9 L
FEV6-Pre: 3.89 L
FEV6FVC-%Change-Post: 0 %
FEV6FVC-%Pred-Post: 103 %
FEV6FVC-%Pred-Pre: 103 %
FVC-%Change-Post: 0 %
FVC-%Pred-Post: 120 %
FVC-%Pred-Pre: 120 %
FVC-Post: 3.91 L
FVC-Pre: 3.91 L
Post FEV1/FVC ratio: 79 %
Post FEV6/FVC ratio: 100 %
Pre FEV1/FVC ratio: 76 %
Pre FEV6/FVC Ratio: 99 %
RV % pred: 87 %
RV: 1.77 L
TLC % pred: 113 %
TLC: 5.72 L

## 2013-10-31 NOTE — Progress Notes (Signed)
PFT done today. Kimmy Parish, CMA  

## 2013-10-31 NOTE — Patient Instructions (Signed)
Please try hard to stop the smoking, before it messes up those good lungs!  Sample Dymista nasal spray

## 2013-10-31 NOTE — Progress Notes (Signed)
06/16/13- 79 yoF smoker referred courtesy of Dr McKeown/  Kelby Aline, PA for chronic allergies.  Pt c/o congestion, intermittent sometimes prod cough with clear/yellow mucus. She describes "allergy" as rhinitis and chest congestion each year around Christmas and Easter( winter and spring). History of seasonal allergic rhinitis with itching and some sneezing each spring and fall. Denies history of asthma. Needed to see her primary physician 4 times in September. Eyes have been watering, nose running, intermittent cough sometimes productive of white or yellow sputum. Occasional mild wheeze. History of deviated septum repair.  She says when she has tried quitting smoking before she gets mood changes and weight gain. Now averaging one half pack per day. History of pneumonia with pneumonia vaccine twice. Rescue inhaler has been some help if acutely ill. She denies reflux symptoms. Family history of uncle and grand daughter with asthma. She is married, retired from an office job with some travel. CXR 04/20/12 IMPRESSION:  Stable exam. No active cardiopulmonary disease.  Original Report Authenticated By: Marlaine Hind, M.D.   08/01/13- 52 yo F smoker followed for allergic rhinitis, recurrent acute bronchitis, tobacco use. FOLLOWS FOR: has not had her 6MW test yet; review labs and cxr with patient; slight wheezing at times. Occasional wheeze lying down. Nasal airway has been clear. Expects spring and fall exacerbations Allergy Profile 06/16/13 Total IgE 41.7   Pos dust mite, dog, [place] grass, ragweed CXR 06/16/13 IMPRESSION:  No active cardiopulmonary disease.  Electronically Signed  By: Kathreen Devoid  On: 06/16/2013 16:17  10/31/13- 66 yo F smoker followed for allergic rhinitis, recurrent acute bronchitis, tobacco use FOLLOWS FOR: Breathing is unchanged since last OV. Having some issues with allergies due to the pollen. Blames pollen for nasal congestion, mild bitemporal headache and dry throat with  stuffy nose and postnasal drip. PFT 10/31/2013-within normal limits with no response to bronchodilator. FVC 3.91/120%, FEV1 3.08/123%, FEV1/FEC 0.79, FEF 25-75% 2.90/129%. TLC 113%, DLCO 86%. 6 minute walk test 10/31/2013-94%, 97%, 97%, 480 m. Normal oxygenation.  ROS-see HPI Constitutional:   No-   weight loss, night sweats, fevers, chills, fatigue, lassitude. HEENT:   +headaches, difficulty swallowing, tooth/dental problems, sore throat,       +sneezing,no- itching, ear ache, +nasal congestion, +post nasal drip,  CV:  No-   chest pain, orthopnea, PND, swelling in lower extremities, anasarca,  dizziness, palpitations Resp: No-   shortness of breath with exertion or at rest.              +productive cough,  No-non-productive cough,  No- coughing up of blood.              No-   change in color of mucus.  No- wheezing Skin: No-   rash or lesions. GI:  No-   heartburn, indigestion, abdominal pain, nausea, vomiting,  GU:  MS:  No-   joint pain or swelling.   Neuro-     nothing unusual Psych:  No- change in mood or affect. No depression or anxiety.  No memory loss.  OBJ- Physical Exam General- Alert, Oriented, Affect-appropriate, Distress- none acute.+odor of tobacco Skin- rash-none, lesions- none, excoriation- none Lymphadenopathy- none Head- atraumatic            Eyes- Gross vision intact, PERRLA, conjunctivae and secretions clear            Ears- Hearing, canals-normal            Nose- Clear, no-Septal dev, mucus, polyps, erosion, perforation  Throat- Mallampati II , mucosa clear , drainage- none, tonsils- atrophic Neck- flexible , trachea midline, no stridor , thyroid nl, carotid no bruit Chest - symmetrical excursion , unlabored           Heart/CV- RRR , no murmur , no gallop  , no rub, nl s1 s2                           - JVD- none , edema- none, stasis changes- none, varices- none           Lung- clear to P&A, wheeze- none, cough- none , dullness-none, rub- none            Chest wall-  Abd-  Br/ Gen/ Rectal- Not done, not indicated Extrem- cyanosis- none, clubbing, none, atrophy- none, strength- nl Neuro- grossly intact to observation

## 2013-11-27 NOTE — Assessment & Plan Note (Signed)
Exacerbation of seasonal rhinitis Plan-sample Dymista

## 2013-11-27 NOTE — Assessment & Plan Note (Signed)
counseled

## 2013-11-27 NOTE — Progress Notes (Signed)
Documentation for 6 minute walk test 

## 2013-11-27 NOTE — Assessment & Plan Note (Addendum)
PFT 10/31/2013-within normal limits with no response to bronchodilator. FVC 3.91/120%, FEV1 3.08/123%, FEV1/FEC 0.79, FEF 25-75% 2.90/129%. TLC 113%, DLCO 86%. 6 minute walk test 10/31/2013-94%, 97%, 97%, 480 m. Normal oxygenation. Currently resolved

## 2013-12-12 ENCOUNTER — Encounter: Payer: Self-pay | Admitting: Emergency Medicine

## 2013-12-12 ENCOUNTER — Ambulatory Visit (INDEPENDENT_AMBULATORY_CARE_PROVIDER_SITE_OTHER): Payer: 59 | Admitting: Emergency Medicine

## 2013-12-12 VITALS — BP 104/62 | HR 74 | Temp 98.0°F | Resp 18 | Ht 64.75 in | Wt 163.0 lb

## 2013-12-12 DIAGNOSIS — Z79899 Other long term (current) drug therapy: Secondary | ICD-10-CM

## 2013-12-12 DIAGNOSIS — R7309 Other abnormal glucose: Secondary | ICD-10-CM

## 2013-12-12 DIAGNOSIS — E782 Mixed hyperlipidemia: Secondary | ICD-10-CM

## 2013-12-12 LAB — CBC WITH DIFFERENTIAL/PLATELET
Basophils Absolute: 0.1 10*3/uL (ref 0.0–0.1)
Basophils Relative: 1 % (ref 0–1)
Eosinophils Absolute: 0.1 10*3/uL (ref 0.0–0.7)
Eosinophils Relative: 2 % (ref 0–5)
HCT: 42.5 % (ref 36.0–46.0)
Hemoglobin: 14.5 g/dL (ref 12.0–15.0)
Lymphocytes Relative: 27 % (ref 12–46)
Lymphs Abs: 1.9 10*3/uL (ref 0.7–4.0)
MCH: 30.5 pg (ref 26.0–34.0)
MCHC: 34.1 g/dL (ref 30.0–36.0)
MCV: 89.3 fL (ref 78.0–100.0)
Monocytes Absolute: 0.5 10*3/uL (ref 0.1–1.0)
Monocytes Relative: 7 % (ref 3–12)
Neutro Abs: 4.4 10*3/uL (ref 1.7–7.7)
Neutrophils Relative %: 63 % (ref 43–77)
Platelets: 222 10*3/uL (ref 150–400)
RBC: 4.76 MIL/uL (ref 3.87–5.11)
RDW: 13.7 % (ref 11.5–15.5)
WBC: 7 10*3/uL (ref 4.0–10.5)

## 2013-12-12 LAB — HEMOGLOBIN A1C
Hgb A1c MFr Bld: 6.1 % — ABNORMAL HIGH (ref <5.7)
Mean Plasma Glucose: 128 mg/dL — ABNORMAL HIGH (ref <117)

## 2013-12-12 NOTE — Progress Notes (Signed)
Subjective:    Patient ID: Karen Barrera, female    DOB: 08/16/1950, 63 y.o.   MRN: [id number]  HPI Comments: 63 yo WF cholesterol/ A1c f/u. She has not been exercising due to knee problems but will restart this week. She is several weeks post gel injections and has seen improvement and feels she can exercise again. She has not been eating as healthy as she would like but plans to improve diet as well. She has not d/c tobacco AD. She notes she will be having sinus surgery next week with Dr [name] for recurrent sinusitis.  CHOL         205   09/02/2013 HDL           74   09/02/2013 LDLCALC      109   09/02/2013 TRIG         111   09/02/2013 CHOLHDL      2.8   09/02/2013 ALT           14   09/02/2013 AST           17   09/02/2013 ALKPHOS       70   09/02/2013 BILITOT      0.5   09/02/2013 HGBA1C      6.2   09/02/2013  CREATININE     0.79   09/02/2013 BUN              14   09/02/2013 NA              137   09/02/2013 K               4.2   09/02/2013 CL              102   09/02/2013 CO2              25   09/02/2013 WBC      8.6   09/02/2013 HGB     14.8   09/02/2013 HCT     42.6   09/02/2013 MCV     86.9   09/02/2013 PLT      210   09/02/2013    Hyperlipidemia      Medication List       This list is accurate as of: 12/12/13 12:48 PM.  Always use your most recent med list.               albuterol 108 (90 BASE) MCG/ACT inhaler  Commonly known as:  PROAIR HFA  Inhale 2 puffs into the lungs every 4 (four) hours as needed for wheezing or shortness of breath.     aspirin 81 MG tablet  Take 81 mg by mouth daily.     co-enzyme Q-10 30 MG capsule  Take 30 mg by mouth daily. Takes 200 mg daily     EPINEPHrine 0.3 mg/0.3 mL Soaj injection  Commonly known as:  EPI-PEN  Inject 0.3 mLs (0.3 mg total) into the muscle once.     GNP CALCIUM 1200 PO  Take by mouth daily.     Magnesium 300 MG Caps  Take by mouth daily.     montelukast 10 MG tablet  Commonly known as:  SINGULAIR  Take 10 mg by mouth at bedtime.      rosuvastatin 10 MG tablet  Commonly known as:  CRESTOR  Take 5 mg by mouth daily.     SUPER B COMPLEX PO  Take by mouth daily.  Vitamin D-3 5000 UNITS Tabs  Take by mouth daily.       Allergies  Allergen Reactions  . Avelox [Moxifloxacin Hcl In Nacl] Other (See Comments)    aches  . Chantix [Varenicline]    Past Medical History  Diagnosis Date  . Allergy   . Arthritis   . Depression   . Hyperlipidemia   . Osteopenia   . Anemia   . Pre-diabetes   . Vitamin D deficiency   . Tobacco dependence   . Colon polyp   . Allergic rhinitis   . DDD (degenerative disc disease)   . DDD (degenerative disc disease), cervical   . DDD (degenerative disc disease), lumbar      Review of Systems  All other systems reviewed and are negative.  BP 104/62  Pulse 74  Temp(Src) 98 F (36.7 C) (Temporal)  Resp 18  Ht 5' 4.75" (1.645 m)  Wt 163 lb (73.936 kg)  BMI 27.32 kg/m2     Objective:   Physical Exam  Nursing note and vitals reviewed. Constitutional: She is oriented to person, place, and time. She appears well-developed and well-nourished. No distress.  HENT:  Head: Normocephalic and atraumatic.  Right Ear: External ear normal.  Left Ear: External ear normal.  Nose: Nose normal.  Mouth/Throat: Oropharynx is clear and moist.  Eyes: Conjunctivae and EOM are normal.  Neck: Normal range of motion. Neck supple. No JVD present. No thyromegaly present.  Cardiovascular: Normal rate, regular rhythm, normal heart sounds and intact distal pulses.   Pulmonary/Chest: Effort normal and breath sounds normal.  Abdominal: Soft. Bowel sounds are normal. She exhibits no distension. There is no tenderness.  Musculoskeletal: Normal range of motion. She exhibits no edema and no tenderness.  Lymphadenopathy:    She has no cervical adenopathy.  Neurological: She is alert and oriented to person, place, and time. No cranial nerve deficit.  Skin: Skin is warm and dry. No rash noted. No  erythema. No pallor.  Psychiatric: She has a normal mood and affect. Her behavior is normal. Judgment and thought content normal.          Assessment & Plan:  1. Elevated A1c/ Cholesterol- recheck labs, Need to eat healthier and exercise AD.  2. Tobacco Dep- advised cessation techniques and need for d/c to decrease Risk, especially with upcoming surgery

## 2013-12-13 LAB — BASIC METABOLIC PANEL WITH GFR
BUN: 11 mg/dL (ref 6–23)
CO2: 22 mEq/L (ref 19–32)
Calcium: 9.7 mg/dL (ref 8.4–10.5)
Chloride: 102 mEq/L (ref 96–112)
Creat: 0.72 mg/dL (ref 0.50–1.10)
GFR, Est African American: >89 mL/min
GFR, Est Non African American: >89 mL/min
Glucose, Bld: 93 mg/dL (ref 70–99)
Potassium: 3.9 mEq/L (ref 3.5–5.3)
Sodium: 138 mEq/L (ref 135–145)

## 2013-12-13 LAB — HEPATIC FUNCTION PANEL
ALT: 13 U/L (ref 0–35)
AST: 19 U/L (ref 0–37)
Albumin: 4.8 g/dL (ref 3.5–5.2)
Alkaline Phosphatase: 58 U/L (ref 39–117)
Bilirubin, Direct: 0.1 mg/dL (ref 0.0–0.3)
Indirect Bilirubin: 0.3 mg/dL (ref 0.2–1.2)
Total Bilirubin: 0.4 mg/dL (ref 0.2–1.2)
Total Protein: 7.2 g/dL (ref 6.0–8.3)

## 2013-12-13 LAB — LIPID PANEL
Cholesterol: 193 mg/dL (ref 0–200)
HDL: 78 mg/dL (ref >39)
LDL Cholesterol: 97 mg/dL (ref 0–99)
Total CHOL/HDL Ratio: 2.5 Ratio
Triglycerides: 89 mg/dL (ref <150)
VLDL: 18 mg/dL (ref 0–40)

## 2013-12-13 LAB — INSULIN, FASTING: Insulin fasting, serum: 11 u[IU]/mL (ref 3–28)

## 2013-12-14 ENCOUNTER — Ambulatory Visit: Payer: Self-pay | Admitting: Emergency Medicine

## 2013-12-26 HISTORY — PX: SINUS EXPLORATION: SHX5214

## 2014-01-13 ENCOUNTER — Other Ambulatory Visit: Payer: Self-pay | Admitting: Emergency Medicine

## 2014-02-03 ENCOUNTER — Other Ambulatory Visit: Payer: Self-pay | Admitting: Otolaryngology

## 2014-04-25 ENCOUNTER — Encounter: Payer: Self-pay | Admitting: Emergency Medicine

## 2014-05-03 ENCOUNTER — Encounter: Payer: Self-pay | Admitting: Physician Assistant

## 2014-05-09 ENCOUNTER — Encounter: Payer: Self-pay | Admitting: Emergency Medicine

## 2014-05-12 ENCOUNTER — Encounter: Payer: Self-pay | Admitting: Emergency Medicine

## 2014-05-12 ENCOUNTER — Ambulatory Visit (INDEPENDENT_AMBULATORY_CARE_PROVIDER_SITE_OTHER): Payer: 59 | Admitting: Emergency Medicine

## 2014-05-12 VITALS — BP 106/70 | HR 60 | Temp 98.0°F | Resp 16 | Wt 163.0 lb

## 2014-05-12 DIAGNOSIS — E782 Mixed hyperlipidemia: Secondary | ICD-10-CM

## 2014-05-12 DIAGNOSIS — I10 Essential (primary) hypertension: Secondary | ICD-10-CM

## 2014-05-12 DIAGNOSIS — Z0000 Encounter for general adult medical examination without abnormal findings: Secondary | ICD-10-CM

## 2014-05-12 DIAGNOSIS — E559 Vitamin D deficiency, unspecified: Secondary | ICD-10-CM

## 2014-05-12 DIAGNOSIS — Z1212 Encounter for screening for malignant neoplasm of rectum: Secondary | ICD-10-CM

## 2014-05-12 DIAGNOSIS — R739 Hyperglycemia, unspecified: Secondary | ICD-10-CM

## 2014-05-12 DIAGNOSIS — Z23 Encounter for immunization: Secondary | ICD-10-CM

## 2014-05-12 DIAGNOSIS — E538 Deficiency of other specified B group vitamins: Secondary | ICD-10-CM

## 2014-05-12 DIAGNOSIS — R5381 Other malaise: Secondary | ICD-10-CM

## 2014-05-12 LAB — CBC WITH DIFFERENTIAL/PLATELET
Basophils Absolute: 0.1 10*3/uL (ref 0.0–0.1)
Basophils Relative: 1 % (ref 0–1)
Eosinophils Absolute: 0.1 10*3/uL (ref 0.0–0.7)
Eosinophils Relative: 2 % (ref 0–5)
HCT: 41.9 % (ref 36.0–46.0)
Hemoglobin: 14.1 g/dL (ref 12.0–15.0)
Lymphocytes Relative: 24 % (ref 12–46)
Lymphs Abs: 1.6 10*3/uL (ref 0.7–4.0)
MCH: 30.2 pg (ref 26.0–34.0)
MCHC: 33.7 g/dL (ref 30.0–36.0)
MCV: 89.7 fL (ref 78.0–100.0)
Monocytes Absolute: 0.5 10*3/uL (ref 0.1–1.0)
Monocytes Relative: 8 % (ref 3–12)
Neutro Abs: 4.4 10*3/uL (ref 1.7–7.7)
Neutrophils Relative %: 65 % (ref 43–77)
Platelets: 198 10*3/uL (ref 150–400)
RBC: 4.67 MIL/uL (ref 3.87–5.11)
RDW: 13.8 % (ref 11.5–15.5)
WBC: 6.7 10*3/uL (ref 4.0–10.5)

## 2014-05-12 LAB — HEMOGLOBIN A1C
Hgb A1c MFr Bld: 6.1 % — ABNORMAL HIGH
Mean Plasma Glucose: 128 mg/dL — ABNORMAL HIGH

## 2014-05-12 NOTE — Patient Instructions (Signed)
H1N1 Influenza (swine flu) Vaccine injection What is this medicine? H1N1 INFLUENZA (SWINE FLU) VACCINE (H1N1 in floo EN zuh (swahyn floo) vak SEEN) is a vaccine to protect from an infection with the pandemic H1N1 flu, also known as the swine flu. The vaccine only helps protect you against this one strain of the flu. This vaccine does not help to the reduce the risk of getting other types of flu. You may also need to get the seasonal influenza virus vaccine. This medicine may be used for other purposes; ask your health care provider or pharmacist if you have questions. COMMON BRAND NAME(S): Influenza A (H1N1) 2009 Monovalent Vaccine What should I tell my health care provider before I take this medicine? They need to know if you have any of these conditions: -Guillain-Barre syndrome -immune system problems -an unusual or allergic reaction to influenza vaccine, eggs, neomycin, polymyxin, other medicines, foods, dyes or preservatives -pregnant or trying to get pregnant -breast-feeding How should I use this medicine? This vaccine is for injection into a muscle. It is given by a health care professional. A copy of Vaccine Information Statements will be given before each vaccination. Read this sheet carefully each time. The sheet may change frequently. Talk to your pediatrician regarding the use of this medicine in children. Special care may be needed. While this drug may be prescribed for children as young as 6 months for selected conditions, precautions do apply. Overdosage: If you think you've taken too much of this medicine contact a poison control center or emergency room at once. Overdosage: If you think you have taken too much of this medicine contact a poison control center or emergency room at once. NOTE: This medicine is only for you. Do not share this medicine with others. What if I miss a dose? If needed, keep appointments for follow-up (booster) doses as directed. It is important not to  miss your dose. Call your doctor or health care professional if you are unable to keep an appointment. What may interact with this medicine? -anakinra -medicines for organ transplant -medicines to treat cancer -other vaccines -rilonacept -steroid medicines like prednisone or cortisone -tumor necrosis factor (TNF) modifiers like adalimumab, etanercept, infliximab, golimumab, or certolizumab This list may not describe all possible interactions. Give your health care provider a list of all the medicines, herbs, non-prescription drugs, or dietary supplements you use. Also tell them if you smoke, drink alcohol, or use illegal drugs. Some items may interact with your medicine. What should I watch for while using this medicine? Report any side effects to your doctor right away. This vaccine lowers your risk of getting the pandemic H1N1 flu. You can get a milder H1N1 flu infection if you are around others with this flu. This flu vaccine will not protect against colds or other illnesses including other flu viruses. You may also need the seasonal influenza vaccine. What side effects may I notice from receiving this medicine? Side effects that you should report to your doctor or health care professional as soon as possible: -allergic reactions like skin rash, itching or hives, swelling of the face, lips, or tongue -breathing problems -muscle weakness -unusual drooping or paralysis of face Side effects that usually do not require medical attention (Report these to your doctor or health care professional if they continue or are bothersome.): -chills -cough -headache -muscle aches and pains -runny or stuffy nose -sore throat -stomach upset -tiredness This list may not describe all possible side effects. Call your doctor for medical advice about  side effects. You may report side effects to FDA at 1-800-FDA-1088. Where should I keep my medicine? This vaccine is only given in a clinic, pharmacy,  doctor's office, or other health care setting and will not be stored at home. NOTE: This sheet is a summary. It may not cover all possible information. If you have questions about this medicine, talk to your doctor, pharmacist, or health care provider.  2015, Elsevier/Gold Standard. (2008-06-13 16:49:51)

## 2014-05-12 NOTE — Progress Notes (Deleted)
Patient ID: Karen Barrera, female   DOB: 1951/03/16, 63 y.o.   MRN: [id number]

## 2014-05-12 NOTE — Progress Notes (Signed)
Subjective:    Patient ID: Karen Barrera, female    DOB: March 30, 1951, 63 y.o.   MRN: [id number]  HPI Comments: 63 yo WF CPE and presents for 3 month F/U for Cholesterol, Pre-Dm, D. Deficient. She has been exercising more routinely. She is trying to eat better. She has been a little stressed lately with family issues but seems to be managing ok. She is continuing to try to decrease tobacco and has completely retired from work again.  She had sinus surgery in June and has not had sinus infection since surgery. She had Synavisc in knees in April which has worked well but has repeat injection soon.   WBC             7.0   12/12/2013 HGB            14.5   12/12/2013 HCT            42.5   12/12/2013 PLT             222   12/12/2013 GLUCOSE          93   12/12/2013 CHOL            193   12/12/2013 TRIG             89   12/12/2013 HDL              78   12/12/2013 LDLCALC          97   12/12/2013 ALT              13   12/12/2013 AST              19   12/12/2013 NA              138   12/12/2013 K               3.9   12/12/2013 CL              102   12/12/2013 CREATININE     0.72   12/12/2013 BUN              11   12/12/2013 CO2              22   12/12/2013 HGBA1C          6.1   12/12/2013   Hyperlipidemia Pertinent negatives include no chest pain or shortness of breath.   Current Outpatient Prescriptions on File Prior to Visit  Medication Sig Dispense Refill  . aspirin 81 MG tablet Take 81 mg by mouth daily.      . B Complex-C (SUPER B COMPLEX PO) Take by mouth daily.      . Calcium Carbonate-Vit D-Min (GNP CALCIUM 1200 PO) Take by mouth daily.      . Cholecalciferol (VITAMIN D-3) 5000 UNITS TABS Take by mouth daily.      Marland Kitchen co-enzyme Q-10 30 MG capsule Take 30 mg by mouth daily. Takes 200 mg daily      . EPINEPHrine (EPI-PEN) 0.3 mg/0.3 mL SOAJ injection Inject 0.3 mLs (0.3 mg total) into the muscle once.  2 Device  2  . Magnesium 300 MG CAPS Take by mouth daily.      . montelukast (SINGULAIR) 10 MG  tablet Take 10 mg by mouth at bedtime.      Marland Kitchen PROAIR HFA 108 (90 BASE) MCG/ACT inhaler INHALE 2 PUFFS INTO THE LUNGS EVERY 4 HOURS AS  NEEDED FOR WHEEZING OR SHORTNESS OF BREATH.  8.5 each  1  . rosuvastatin (CRESTOR) 10 MG tablet Take 5 mg by mouth daily.       No current facility-administered medications on file prior to visit.   Allergies  Allergen Reactions  . Avelox [Moxifloxacin Hcl In Nacl] Other (See Comments)    aches  . Chantix [Varenicline]    Past Medical History  Diagnosis Date  . Allergy   . Arthritis   . Depression   . Hyperlipidemia   . Osteopenia   . Anemia   . Pre-diabetes   . Vitamin D deficiency   . Tobacco dependence   . Colon polyp   . Allergic rhinitis   . DDD (degenerative disc disease)   . DDD (degenerative disc disease), cervical   . DDD (degenerative disc disease), lumbar    Past Surgical History  Procedure Laterality Date  . Shoulder surgery  2012    left; rotator cuff  . Elbow surgery  10/2009    left/torn tendon  . Shoulder surgery  2003    right; removal spurs  . Oophorectomy  1999    right  . Partial vaginal hysterectomy  1978  . Sinus exploration  12/2013    [name]   History   Social History  . Marital Status: Married    Spouse Name: N/A    Number of Children: N/A  . Years of Education: N/A   Social History Main Topics  . Smoking status: Current Every Day Smoker -- 0.50 packs/day for 40 years    Types: Cigarettes  . Smokeless tobacco: Never Used     Comment: Pt quit smoking during pregnancies, also 3 yrs ago for 6 mos.  . Alcohol Use: No  . Drug Use: No  . Sexual Activity: None   Other Topics Concern  . None   Social History Narrative  . None   Family History  Problem Relation Age of Onset  . Stomach cancer Neg Hx   . Colon cancer Other 16  . Colon cancer Other 65  . Diabetes Mother   . Cancer Mother     skin  . Seizures Sister   . Cancer Maternal Aunt     breast  . Cancer Paternal Aunt     breast  . Cancer  Paternal Uncle     colon  . Stroke Maternal Grandmother   . Hyperlipidemia Maternal Grandfather   . Cancer Paternal Grandmother     breast  . Arthritis Paternal Grandmother     MAINTENANCE: Colonoscopy:2013 Polyp x 5 due 2016 Mammo:2015 WNL BMD:02/13/14 Stable Osteopenia Pap/ Pelvic:2015 @ Gyn WNL JIR:6789 WNL Dentist:Q 6 month  IMMUNIZATIONS: Tdap:02/2010 Pneumovax:2012 Zostavax:2013 Influenza:2015  Patient Care Team: [name], MD as PCP - General (Internal Medicine) Lavonna Monarch, MD as Consulting Physician (Dermatology) Troy Sine, MD as Consulting Physician (Cardiology) Thornell Sartorius, MD as Consulting Physician (Otolaryngology) Izora Gala, MD as Consulting Physician (Otolaryngology) Sable Feil, MD as Consulting Physician (Gastroenterology) Adam Phenix, MD as Consulting Physician (Gynecology) Jabier Mutton, [facility]) Luretha Rued, (Dentist)    Review of Systems  Respiratory: Negative for shortness of breath.   Cardiovascular: Negative for chest pain.  All other systems reviewed and are negative.  BP 106/70  Pulse 60  Temp(Src) 98 F (36.7 C) (Temporal)  Resp 16  Wt 163 lb (73.936 kg)     Objective:   Physical Exam  Nursing note and vitals reviewed. Constitutional: She is oriented to person, place, and time.  She appears well-developed and well-nourished. No distress.  HENT:  Head: Normocephalic and atraumatic.  Right Ear: External ear normal.  Left Ear: External ear normal.  Nose: Nose normal.  Mouth/Throat: Oropharynx is clear and moist.  Eyes: Conjunctivae and EOM are normal. Pupils are equal, round, and reactive to light. Right eye exhibits no discharge. Left eye exhibits no discharge. No scleral icterus.  Neck: Normal range of motion. Neck supple. No JVD present. No tracheal deviation present. No thyromegaly present.  Cardiovascular: Normal rate, regular rhythm, normal heart sounds and intact distal pulses.   Pulmonary/Chest: Effort normal and  breath sounds normal.  Abdominal: Soft. Bowel sounds are normal. She exhibits no distension and no mass. There is no tenderness. There is no rebound and no guarding.  Genitourinary:  Def gyn  Musculoskeletal: Normal range of motion. She exhibits no edema and no tenderness.  Lymphadenopathy:    She has no cervical adenopathy.  Neurological: She is alert and oriented to person, place, and time. She has normal reflexes. No cranial nerve deficit. She exhibits normal muscle tone. Coordination normal.  Skin: Skin is warm and dry. No rash noted. No erythema. No pallor.  Psychiatric: She has a normal mood and affect. Her behavior is normal. Judgment and thought content normal.     AORTA SCAN WNL EKG NSCSPT     Assessment & Plan:  1. CPE- Update screening labs/ History/ Immunizations/ Testing as needed. Advised healthy diet, QD exercise, increase H20 and continue RX/ Vitamins AD.   2. 3 month F/U for Cholesterol, Pre-Dm, D. Deficient. Needs healthy diet, cardio QD and obtain healthy weight. Check Labs, Check BP if >130/80 call office

## 2014-05-13 LAB — HEPATIC FUNCTION PANEL
ALT: 14 U/L (ref 0–35)
AST: 15 U/L (ref 0–37)
Albumin: 4.4 g/dL (ref 3.5–5.2)
Alkaline Phosphatase: 69 U/L (ref 39–117)
Bilirubin, Direct: 0.1 mg/dL (ref 0.0–0.3)
Indirect Bilirubin: 0.3 mg/dL (ref 0.2–1.2)
Total Bilirubin: 0.4 mg/dL (ref 0.2–1.2)
Total Protein: 6.7 g/dL (ref 6.0–8.3)

## 2014-05-13 LAB — URINALYSIS, ROUTINE W REFLEX MICROSCOPIC
Bilirubin Urine: NEGATIVE
Glucose, UA: NEGATIVE mg/dL
Hgb urine dipstick: NEGATIVE
Ketones, ur: NEGATIVE mg/dL
Leukocytes, UA: NEGATIVE
Nitrite: NEGATIVE
Protein, ur: NEGATIVE mg/dL
Specific Gravity, Urine: 1.007 (ref 1.005–1.030)
Urobilinogen, UA: 0.2 mg/dL (ref 0.0–1.0)
pH: 6 (ref 5.0–8.0)

## 2014-05-13 LAB — BASIC METABOLIC PANEL WITH GFR
BUN: 12 mg/dL (ref 6–23)
CO2: 26 mEq/L (ref 19–32)
Calcium: 9.5 mg/dL (ref 8.4–10.5)
Chloride: 102 mEq/L (ref 96–112)
Creat: 0.73 mg/dL (ref 0.50–1.10)
GFR, Est African American: >89 mL/min
GFR, Est Non African American: 89 mL/min
Glucose, Bld: 93 mg/dL (ref 70–99)
Potassium: 4.4 mEq/L (ref 3.5–5.3)
Sodium: 138 mEq/L (ref 135–145)

## 2014-05-13 LAB — MAGNESIUM: Magnesium: 2.1 mg/dL (ref 1.5–2.5)

## 2014-05-13 LAB — LIPID PANEL
Cholesterol: 151 mg/dL (ref 0–200)
HDL: 77 mg/dL (ref >39)
LDL Cholesterol: 58 mg/dL (ref 0–99)
Total CHOL/HDL Ratio: 2 Ratio
Triglycerides: 82 mg/dL (ref <150)
VLDL: 16 mg/dL (ref 0–40)

## 2014-05-13 LAB — INSULIN, FASTING: Insulin fasting, serum: 11.6 u[IU]/mL (ref 2.0–19.6)

## 2014-05-13 LAB — VITAMIN D 25 HYDROXY (VIT D DEFICIENCY, FRACTURES): Vit D, 25-Hydroxy: 65 ng/mL (ref 30–89)

## 2014-05-13 LAB — VITAMIN B12: Vitamin B-12: 639 pg/mL (ref 211–911)

## 2014-05-13 LAB — TSH: TSH: 1.634 u[IU]/mL (ref 0.350–4.500)

## 2014-05-15 ENCOUNTER — Encounter: Payer: Self-pay | Admitting: Emergency Medicine

## 2014-06-15 ENCOUNTER — Ambulatory Visit (INDEPENDENT_AMBULATORY_CARE_PROVIDER_SITE_OTHER): Payer: 59 | Admitting: Emergency Medicine

## 2014-06-15 ENCOUNTER — Encounter: Payer: Self-pay | Admitting: Emergency Medicine

## 2014-06-15 VITALS — BP 136/78 | HR 66 | Temp 98.0°F | Resp 16 | Ht 64.75 in | Wt 163.0 lb

## 2014-06-15 DIAGNOSIS — F17200 Nicotine dependence, unspecified, uncomplicated: Secondary | ICD-10-CM

## 2014-06-15 DIAGNOSIS — J320 Chronic maxillary sinusitis: Secondary | ICD-10-CM

## 2014-06-15 DIAGNOSIS — F411 Generalized anxiety disorder: Secondary | ICD-10-CM

## 2014-06-15 MED ORDER — PREDNISONE 10 MG PO TABS: ORAL_TABLET | ORAL | Status: DC

## 2014-06-15 MED ORDER — ALBUTEROL SULFATE HFA 108 (90 BASE) MCG/ACT IN AERS: INHALATION_SPRAY | RESPIRATORY_TRACT | Status: DC

## 2014-06-15 MED ORDER — DOXYCYCLINE HYCLATE 100 MG PO TABS: 100.0000 mg | ORAL_TABLET | Freq: Two times a day (BID) | ORAL | Status: DC

## 2014-06-15 MED ORDER — BUPROPION HCL ER (XL) 150 MG PO TB24: 150.0000 mg | ORAL_TABLET | Freq: Two times a day (BID) | ORAL | Status: DC

## 2014-06-15 NOTE — Patient Instructions (Signed)
Do not start wellbutrin until steroids/ Doxy are completed. Smoking Cessation, Tips for Success If you are ready to quit smoking, congratulations! You have chosen to help yourself be healthier. Cigarettes bring nicotine, tar, carbon monoxide, and other irritants into your body. Your lungs, heart, and blood vessels will be able to work better without these poisons. There are many different ways to quit smoking. Nicotine gum, nicotine patches, a nicotine inhaler, or nicotine nasal spray can help with physical craving. Hypnosis, support groups, and medicines help break the habit of smoking. WHAT THINGS CAN I DO TO MAKE QUITTING EASIER?  Here are some tips to help you quit for good:  Pick a date when you will quit smoking completely. Tell all of your friends and family about your plan to quit on that date.  Do not try to slowly cut down on the number of cigarettes you are smoking. Pick a quit date and quit smoking completely starting on that day.  Throw away all cigarettes.   Clean and remove all ashtrays from your home, work, and car.  On a card, write down your reasons for quitting. Carry the card with you and read it when you get the urge to smoke.  Cleanse your body of nicotine. Drink enough water and fluids to keep your urine clear or pale yellow. Do this after quitting to flush the nicotine from your body.  Learn to predict your moods. Do not let a bad situation be your excuse to have a cigarette. Some situations in your life might tempt you into wanting a cigarette.  Never have "just one" cigarette. It leads to wanting another and another. Remind yourself of your decision to quit.  Change habits associated with smoking. If you smoked while driving or when feeling stressed, try other activities to replace smoking. Stand up when drinking your coffee. Brush your teeth after eating. Sit in a different chair when you read the paper. Avoid alcohol while trying to quit, and try to drink fewer  caffeinated beverages. Alcohol and caffeine may urge you to smoke.  Avoid foods and drinks that can trigger a desire to smoke, such as sugary or spicy foods and alcohol.  Ask people who smoke not to smoke around you.  Have something planned to do right after eating or having a cup of coffee. For example, plan to take a walk or exercise.  Try a relaxation exercise to calm you down and decrease your stress. Remember, you may be tense and nervous for the first 2 weeks after you quit, but this will pass.  Find new activities to keep your hands busy. Play with a pen, coin, or rubber band. Doodle or draw things on paper.  Brush your teeth right after eating. This will help cut down on the craving for the taste of tobacco after meals. You can also try mouthwash.   Use oral substitutes in place of cigarettes. Try using lemon drops, carrots, cinnamon sticks, or chewing gum. Keep them handy so they are available when you have the urge to smoke.  When you have the urge to smoke, try deep breathing.  Designate your home as a nonsmoking area.  If you are a heavy smoker, ask your health care provider about a prescription for nicotine chewing gum. It can ease your withdrawal from nicotine.  Reward yourself. Set aside the cigarette money you save and buy yourself something nice.  Look for support from others. Join a support group or smoking cessation program. Ask someone at home  or at work to help you with your plan to quit smoking.  Always ask yourself, "Do I need this cigarette or is this just a reflex?" Tell yourself, "Today, I choose not to smoke," or "I do not want to smoke." You are reminding yourself of your decision to quit.  Do not replace cigarette smoking with electronic cigarettes (commonly called e-cigarettes). The safety of e-cigarettes is unknown, and some may contain harmful chemicals.  If you relapse, do not give up! Plan ahead and think about what you will do the next time you get  the urge to smoke. HOW WILL I FEEL WHEN I QUIT SMOKING? You may have symptoms of withdrawal because your body is used to nicotine (the addictive substance in cigarettes). You may crave cigarettes, be irritable, feel very hungry, cough often, get headaches, or have difficulty concentrating. The withdrawal symptoms are only temporary. They are strongest when you first quit but will go away within 10-14 days. When withdrawal symptoms occur, stay in control. Think about your reasons for quitting. Remind yourself that these are signs that your body is healing and getting used to being without cigarettes. Remember that withdrawal symptoms are easier to treat than the major diseases that smoking can cause.  Even after the withdrawal is over, expect periodic urges to smoke. However, these cravings are generally short lived and will go away whether you smoke or not. Do not smoke! WHAT RESOURCES ARE AVAILABLE TO HELP ME QUIT SMOKING? Your health care provider can direct you to community resources or hospitals for support, which may include:  Group support.  Education.  Hypnosis.  Therapy. Document Released: 04/11/2004 Document Revised: 11/28/2013 Document Reviewed: 12/30/2012 [facility] Patient Information 2015 [place], [place]. This information is not intended to replace advice given to you by your health care provider. Make sure you discuss any questions you have with your health care provider.

## 2014-06-15 NOTE — Progress Notes (Addendum)
Subjective:    Patient ID: Karen Barrera, female    DOB: [birth year], 63 y.o.   MRN: [id number]  HPI Comments: 63 yo WF has been having increased congestion over the last 2 weeks. She has been using inhaler more and has yellow green production. She is mildly SOB. She is still smoking.  She is concerned with increased stress and decreased memory. She wants to quit smoking. She is also concerned she may need ADD medicines.   Cough Associated symptoms include shortness of breath.  Shortness of Breath     Medication List       This list is accurate as of: 06/15/14  9:07 AM.  Always use your most recent med list.               aspirin 81 MG tablet  Take 81 mg by mouth daily.     co-enzyme Q-10 30 MG capsule  Take 30 mg by mouth daily. Takes 200 mg daily     EPINEPHrine 0.3 mg/0.3 mL Soaj injection  Commonly known as:  EPI-PEN  Inject 0.3 mLs (0.3 mg total) into the muscle once.     GNP CALCIUM 1200 PO  Take by mouth daily.     Magnesium 300 MG Caps  Take by mouth daily.     montelukast 10 MG tablet  Commonly known as:  SINGULAIR  Take 10 mg by mouth at bedtime.     PROAIR HFA 108 (90 BASE) MCG/ACT inhaler  Generic drug:  albuterol  INHALE 2 PUFFS INTO THE LUNGS EVERY 4 HOURS AS NEEDED FOR WHEEZING OR SHORTNESS OF BREATH.     rosuvastatin 10 MG tablet  Commonly known as:  CRESTOR  Take 5 mg by mouth daily.     SUPER B COMPLEX PO  Take by mouth daily.     Vitamin D-3 5000 UNITS Tabs  Take by mouth daily.       Allergies  Allergen Reactions  . Avelox [Moxifloxacin Hcl In Nacl] Other (See Comments)    aches  . Chantix [Varenicline]     Past Medical History  Diagnosis Date  . Allergy   . Arthritis   . Depression   . Hyperlipidemia   . Osteopenia   . Anemia   . Pre-diabetes   . Vitamin D deficiency   . Tobacco dependence   . Colon polyp   . Allergic rhinitis   . DDD (degenerative disc disease)   . DDD (degenerative disc disease), cervical   .  DDD (degenerative disc disease), lumbar       Review of Systems  Constitutional: Positive for fatigue.  HENT: Positive for congestion.   Respiratory: Positive for cough and shortness of breath.   Psychiatric/Behavioral: The patient is nervous/anxious.   All other systems reviewed and are negative. BP 136/78 mmHg  Pulse 66  Temp(Src) 98 F (36.7 C) (Temporal)  Resp 16  Ht 5' 4.75" (1.645 m)  Wt 163 lb (73.936 kg)  BMI 27.32 kg/m2  SpO2 98%      Objective:   Physical Exam  Constitutional: She is oriented to person, place, and time. She appears well-developed and well-nourished.  HENT:  Head: Normocephalic and atraumatic.  Right Ear: External ear normal.  Left Ear: External ear normal.  Nose: Nose normal.  Mouth/Throat: Oropharynx is clear and moist.  Eyes: Conjunctivae and EOM are normal.  Neck: Normal range of motion.  Cardiovascular: Normal rate, regular rhythm, normal heart sounds and intact distal pulses.   Pulmonary/Chest: Effort  normal.  Wheeze, ? rhonchi  Musculoskeletal: Normal range of motion.  Lymphadenopathy:    She has no cervical adenopathy.  Neurological: She is alert and oriented to person, place, and time.  Skin: Skin is warm and dry. No rash noted.  Psychiatric: She has a normal mood and affect. Judgment normal.  Nursing note and vitals reviewed.         Assessment & Plan:  1. Anxiety/ Tobacco dependence/ ? ADD vs anxiety- Start Wellbutrin 150 mg QD x 1 week then increase to BID,w/c if SX increase or ER, recommend counseling if symptoms continue. Discussed need for close monitoring with new RX and do not start until PRED/ DOXY completed.  2. Sinusitis/ ? Early pneumonia vs bronchitis- Take medicines AD, increase H2o and restart Mucinex.  Patient called back after visit saying she had stomach cramps and diarhea, advised probably viral- bland diet/ add probiotics increase clear fluids. ER if symptoms increase.

## 2014-07-05 ENCOUNTER — Other Ambulatory Visit (INDEPENDENT_AMBULATORY_CARE_PROVIDER_SITE_OTHER): Payer: 59

## 2014-07-05 DIAGNOSIS — Z1212 Encounter for screening for malignant neoplasm of rectum: Secondary | ICD-10-CM

## 2014-07-05 DIAGNOSIS — Z0000 Encounter for general adult medical examination without abnormal findings: Secondary | ICD-10-CM

## 2014-07-05 LAB — POC HEMOCCULT BLD/STL (HOME/3-CARD/SCREEN)
Card #2 Fecal Occult Blod, POC: NEGATIVE
Card #3 Fecal Occult Blood, POC: NEGATIVE
Fecal Occult Blood, POC: NEGATIVE

## 2014-07-31 ENCOUNTER — Other Ambulatory Visit: Payer: Self-pay | Admitting: *Deleted

## 2014-07-31 MED ORDER — MONTELUKAST SODIUM 10 MG PO TABS
10.0000 mg | ORAL_TABLET | Freq: Every day | ORAL | Status: DC
Start: 2014-07-31 — End: 2014-11-21

## 2014-10-07 ENCOUNTER — Other Ambulatory Visit: Payer: Self-pay | Admitting: Emergency Medicine

## 2014-10-30 ENCOUNTER — Ambulatory Visit (INDEPENDENT_AMBULATORY_CARE_PROVIDER_SITE_OTHER): Payer: 59 | Admitting: Physician Assistant

## 2014-10-30 ENCOUNTER — Other Ambulatory Visit (INDEPENDENT_AMBULATORY_CARE_PROVIDER_SITE_OTHER): Payer: 59

## 2014-10-30 ENCOUNTER — Encounter: Payer: Self-pay | Admitting: Physician Assistant

## 2014-10-30 ENCOUNTER — Telehealth: Payer: Self-pay | Admitting: Gastroenterology

## 2014-10-30 VITALS — BP 100/66 | HR 80 | Temp 99.1°F | Ht 64.0 in | Wt 164.0 lb

## 2014-10-30 DIAGNOSIS — R1032 Left lower quadrant pain: Secondary | ICD-10-CM

## 2014-10-30 LAB — CBC WITH DIFFERENTIAL/PLATELET
Basophils Absolute: 0.1 10*3/uL (ref 0.0–0.1)
Basophils Relative: 0.8 % (ref 0.0–3.0)
Eosinophils Absolute: 0.1 10*3/uL (ref 0.0–0.7)
Eosinophils Relative: 2 % (ref 0.0–5.0)
HCT: 38.2 % (ref 36.0–46.0)
Hemoglobin: 13.1 g/dL (ref 12.0–15.0)
Lymphocytes Relative: 19.7 % (ref 12.0–46.0)
Lymphs Abs: 1.3 10*3/uL (ref 0.7–4.0)
MCHC: 34.4 g/dL (ref 30.0–36.0)
MCV: 88.4 fl (ref 78.0–100.0)
Monocytes Absolute: 0.5 10*3/uL (ref 0.1–1.0)
Monocytes Relative: 7.9 % (ref 3.0–12.0)
Neutro Abs: 4.7 10*3/uL (ref 1.4–7.7)
Neutrophils Relative %: 69.6 % (ref 43.0–77.0)
Platelets: 241 10*3/uL (ref 150.0–400.0)
RBC: 4.32 Mil/uL (ref 3.87–5.11)
RDW: 13.7 % (ref 11.5–15.5)
WBC: 6.8 10*3/uL (ref 4.0–10.5)

## 2014-10-30 LAB — BASIC METABOLIC PANEL
BUN: 13 mg/dL (ref 6–23)
CO2: 30 mEq/L (ref 19–32)
Calcium: 9.4 mg/dL (ref 8.4–10.5)
Chloride: 100 mEq/L (ref 96–112)
Creatinine, Ser: 0.74 mg/dL (ref 0.40–1.20)
GFR: 84.18 mL/min (ref >60.00)
Glucose, Bld: 138 mg/dL — ABNORMAL HIGH (ref 70–99)
Potassium: 3.2 mEq/L — ABNORMAL LOW (ref 3.5–5.1)
Sodium: 137 mEq/L (ref 135–145)

## 2014-10-30 LAB — SEDIMENTATION RATE: Sed Rate: 66 mm/hr — ABNORMAL HIGH (ref 0–22)

## 2014-10-30 MED ORDER — AMOXICILLIN-POT CLAVULANATE 600-42.9 MG/5ML PO SUSR: ORAL | Status: DC

## 2014-10-30 NOTE — Progress Notes (Signed)
Patient ID: Karen Barrera, female   DOB: 02/05/1951, 64 y.o.   MRN: [id number]     History of Present Illness: Karen Barrera is a 64 year old female formerly followed by Dr. Sharlett Iles. She underwent a colonoscopy in December 2013 at which time multiple sessile polyps ranging between 3-5 mm in size were found throughout the entire colon, polypectomy was performed using snare cautery. She was advised to have surveillance in 3 years which would be December 2016. She has been feeling well and moving her bowels on a daily basis until last week when she became constipated. She had a bowel movement on Tuesday and on Wednesday began to develop left lower quadrant abdominal pain. The pain persisted on Thursday. Friday she used to enemas and 3 stool softeners and had an explosive bowel movement Saturday morning followed by 2 other small watery bowel movements. Sunday she had several bowel movements of small nuggets. She has no bright red blood per rectum or melena. She continues to have left lower quadrant and suprapubic pain that she states is a 7 or 8  on a scale of 1-10. She has been having chills but no fever. She has no nausea or vomiting. There is no radiation of the pain. The pain is not exacerbated with movement. She has no urinary symptoms.   Past Medical History  Diagnosis Date  . Allergy   . Arthritis   . Depression   . Hyperlipidemia   . Osteopenia   . Anemia   . Pre-diabetes   . Vitamin D deficiency   . Tobacco dependence   . Colon polyp   . Allergic rhinitis   . DDD (degenerative disc disease)   . DDD (degenerative disc disease), cervical   . DDD (degenerative disc disease), lumbar     Past Surgical History  Procedure Laterality Date  . Shoulder surgery  2012    left; rotator cuff  . Elbow surgery  10/2009    left/torn tendon  . Shoulder surgery  2003    right; removal spurs  . Oophorectomy  1999    right  . Partial vaginal hysterectomy  1978  . Sinus exploration  12/2013   [name]   Family History  Problem Relation Age of Onset  . Stomach cancer Neg Hx   . Colon cancer Other 27  . Colon cancer Other 46  . Diabetes Mother   . Cancer Mother     skin  . Seizures Sister   . Cancer Maternal Aunt     breast  . Cancer Paternal Aunt     breast  . Cancer Paternal Uncle     colon  . Stroke Maternal Grandmother   . Hyperlipidemia Maternal Grandfather   . Cancer Paternal Grandmother     breast  . Arthritis Paternal Grandmother    History  Substance Use Topics  . Smoking status: Current Every Day Smoker -- 0.50 packs/day for 40 years    Types: Cigarettes  . Smokeless tobacco: Never Used     Comment: Pt quit smoking during pregnancies, also 3 yrs ago for 6 mos.  . Alcohol Use: No   Current Outpatient Prescriptions  Medication Sig Dispense Refill  . albuterol (PROAIR HFA) 108 (90 BASE) MCG/ACT inhaler INHALE 2 PUFFS INTO THE LUNGS EVERY 4 HOURS AS NEEDED FOR WHEEZING OR SHORTNESS OF BREATH. 18 g 1  . aspirin 81 MG tablet Take 81 mg by mouth daily.    . B Complex-C (SUPER B COMPLEX PO) Take by mouth daily.    Marland Kitchen  buPROPion (WELLBUTRIN XL) 150 MG 24 hr tablet Take 1 tablet (150 mg total) by mouth 2 (two) times daily. Start by taking 1 tablet daily x 1 week. Then increase to twice daily. 60 tablet 2  . Cholecalciferol (VITAMIN D-3) 5000 UNITS TABS Take by mouth daily.    Marland Kitchen co-enzyme Q-10 30 MG capsule Take 30 mg by mouth daily. Takes 200 mg daily    . CRESTOR 10 MG tablet TAKE 1 TABLET BY MOUTH EVERY DAY 30 tablet 0  . diphenhydrAMINE (BENADRYL) 25 MG tablet Take 25 mg by mouth every 6 (six) hours as needed.    Marland Kitchen EPINEPHrine (EPI-PEN) 0.3 mg/0.3 mL SOAJ injection Inject 0.3 mLs (0.3 mg total) into the muscle once. 2 Device 2  . Magnesium 300 MG CAPS Take by mouth daily.    . Melatonin 5 MG CAPS Take 1 capsule by mouth.    . montelukast (SINGULAIR) 10 MG tablet Take 1 tablet (10 mg total) by mouth at bedtime. 90 tablet 1  . Probiotic Product (PROBIOTIC DAILY  PO) Take 1 tablet by mouth daily.    Marland Kitchen amoxicillin-clavulanate (AUGMENTIN ES-600) 600-42.9 MG/5ML suspension Take 7.83ml twice a day for 7 days 125 mL 0   No current facility-administered medications for this visit.   Allergies  Allergen Reactions  . Avelox [Moxifloxacin Hcl In Nacl] Other (See Comments)    aches  . Chantix [Varenicline]       Review of Systems: Gen: Denies any fever, chills, sweats, anorexia, fatigue, weakness, malaise, weight loss, and sleep disorder CV: Denies chest pain, angina, palpitations, syncope, orthopnea, PND, peripheral edema, and claudication. Resp: Denies dyspnea at rest, dyspnea with exercise, cough, sputum, wheezing, coughing up blood, and pleurisy. GI: Denies vomiting blood, jaundice, and fecal incontinence.   Denies dysphagia or odynophagia. GU : Denies urinary burning, blood in urine, urinary frequency, urinary hesitancy, nocturnal urination, and urinary incontinence. MS: Denies joint pain, limitation of movement, and swelling, stiffness, low back pain, extremity pain. Denies muscle weakness, cramps, atrophy.  Derm: Denies rash, itching, dry skin, hives, moles, warts, or unhealing ulcers.  Psych: Denies depression, anxiety, memory loss, suicidal ideation, hallucinations, paranoia, and confusion. Heme: Denies bruising, bleeding, and enlarged lymph nodes. Neuro:  Denies any headaches, dizziness, paresthesia Endo:  Denies any problems with DM, thyroid, adrenal    Endoscopies: See history of present illness   Physical Exam: General: Pleasant, well developed ,female in no acute distress Head: Normocephalic and atraumatic Eyes:  sclerae anicteric, conjunctiva pink  Ears: Normal auditory acuity Lungs: Clear throughout to auscultation Heart: Regular rate and rhythm Abdomen: Soft, non distended, tender LLQ and suprapubic area with guarding but nio rebound, no CVAT,No masses, no hepatomegaly. Normal bowel sounds Musculoskeletal: Symmetrical with no  gross deformities  Extremities: No edema  Neurological: Alert oriented x 4, grossly nonfocal Psychological:  Alert and cooperative. Normal mood and affect  Assessment and Recommendations:  64 year old female with a five-day history of left lower quadrant and suprapubic pain that started during an episode of constipation here for evaluation. Patient has a low-grade fever and rates her pain for 7 or 8 on a scale of 1-10. She is noted to be tender on exam. He CBC will be obtained as well he CT of the abdomen and pelvis to evaluate for diverticulitis. She has been started on Augmentin suspension 600/42.9 per 5 ML 7.5 ML twice a day for 7 days. To adhere to a low residue diet. She should contact us in 2-3 days if she has  had no improvement in her symptoms.       Karen Barrera, Deloris Ping 10/30/2014,

## 2014-10-30 NOTE — Patient Instructions (Signed)
Your physician has requested that you go to the basement for the following lab work before leaving today:  CBC, BMET, ESR    You have been scheduled for a CT scan of the abdomen and pelvis at [facility] ([address]---this is in the same building as [facility]).   You are scheduled on 10/31/2014 at 9:45am. You should arrive 15 minutes prior to your appointment time for registration. Please follow the written instructions below on the day of your exam:  WARNING: IF YOU ARE ALLERGIC TO IODINE/X-RAY DYE, PLEASE NOTIFY RADIOLOGY IMMEDIATELY AT [phone]! YOU WILL BE GIVEN A 13 HOUR PREMEDICATION PREP.  1) Do not eat or drink anything after 5:45am (4 hours prior to your test) 2) You have been given 2 bottles of oral contrast to drink. The solution may taste better if refrigerated, but do NOT add ice or any other liquid to this solution. Shake well before drinking.    Drink 1 bottle of contrast @ 7:45am (2 hours prior to your exam)  Drink 1 bottle of contrast @ 8:45am (1 hour prior to your exam)  You may take any medications as prescribed with a small amount of water except for the following: Metformin, Glucophage, Glucovance, Avandamet, Riomet, Fortamet, Actoplus Met, Janumet, Glumetza or Metaglip. The above medications must be held the day of the exam AND 48 hours after the exam.  The purpose of you drinking the oral contrast is to aid in the visualization of your intestinal tract. The contrast solution may cause some diarrhea. Before your exam is started, you will be given a small amount of fluid to drink. Depending on your  individual set of symptoms, you may also receive an intravenous injection of x-ray contrast/dye. Plan on being at [facility] for 30 minutes or long, depending on the type of exam you are having performed.  If you have any questions regarding your exam or if you need to reschedule, you may call the CT department at [phone] between the  hours of 8:00 am and 5:00 pm, Monday-Friday.   We have sent the following medications to your pharmacy for you to pick up at your convenience:  Augmentin  You have been given a copy of a low residue diet  Take 2 tablespoons of Milk of Magnesia at bedtime tonight.   ________________________________________________________________________

## 2014-10-30 NOTE — Telephone Encounter (Signed)
Spoke with patient and she states she started having abdominal pain and cramping last Thursday. She was constipated. On Friday, she did 2 enemas and 2 stool softeners. She had a bowel movement Saturday AM. She continues to have abdominal pain and cramping. States it feels like my stomach is falling out when I stand. Scheduled with Cecille Rubin Hvozdovic, PA-C today at 2:45 PM.

## 2014-10-31 ENCOUNTER — Inpatient Hospital Stay: Admission: RE | Admit: 2014-10-31 | Payer: 59 | Source: Ambulatory Visit

## 2014-10-31 ENCOUNTER — Inpatient Hospital Stay (HOSPITAL_COMMUNITY)
Admission: EM | Admit: 2014-10-31 | Discharge: 2014-11-03 | DRG: 340 | Disposition: A | Discharge status: Home / Self Care | Payer: 59 | Attending: Surgery | Admitting: Surgery

## 2014-10-31 ENCOUNTER — Encounter (HOSPITAL_COMMUNITY): Admission: EM | Disposition: A | Discharge status: Home / Self Care | Payer: Self-pay | Source: Home / Self Care

## 2014-10-31 ENCOUNTER — Telehealth: Payer: Self-pay | Admitting: *Deleted

## 2014-10-31 ENCOUNTER — Encounter (HOSPITAL_COMMUNITY): Payer: Self-pay | Admitting: Emergency Medicine

## 2014-10-31 ENCOUNTER — Emergency Department (HOSPITAL_COMMUNITY): Payer: 59

## 2014-10-31 ENCOUNTER — Other Ambulatory Visit (HOSPITAL_COMMUNITY): Payer: Self-pay

## 2014-10-31 ENCOUNTER — Emergency Department (HOSPITAL_COMMUNITY): Payer: 59 | Admitting: Certified Registered"

## 2014-10-31 ENCOUNTER — Ambulatory Visit (INDEPENDENT_AMBULATORY_CARE_PROVIDER_SITE_OTHER)
Admission: RE | Admit: 2014-10-31 | Discharge: 2014-10-31 | Disposition: A | Discharge status: Home / Self Care | Payer: 59 | Source: Ambulatory Visit | Attending: Physician Assistant | Admitting: Physician Assistant

## 2014-10-31 DIAGNOSIS — Z90710 Acquired absence of both cervix and uterus: Secondary | ICD-10-CM | POA: Diagnosis not present

## 2014-10-31 DIAGNOSIS — F17210 Nicotine dependence, cigarettes, uncomplicated: Secondary | ICD-10-CM | POA: Diagnosis present

## 2014-10-31 DIAGNOSIS — R1032 Left lower quadrant pain: Secondary | ICD-10-CM

## 2014-10-31 DIAGNOSIS — R109 Unspecified abdominal pain: Secondary | ICD-10-CM | POA: Diagnosis present

## 2014-10-31 DIAGNOSIS — K353 Acute appendicitis with localized peritonitis: Principal | ICD-10-CM | POA: Diagnosis present

## 2014-10-31 DIAGNOSIS — Z800 Family history of malignant neoplasm of digestive organs: Secondary | ICD-10-CM | POA: Diagnosis not present

## 2014-10-31 DIAGNOSIS — Z888 Allergy status to other drugs, medicaments and biological substances status: Secondary | ICD-10-CM

## 2014-10-31 DIAGNOSIS — Z823 Family history of stroke: Secondary | ICD-10-CM | POA: Diagnosis not present

## 2014-10-31 DIAGNOSIS — Z833 Family history of diabetes mellitus: Secondary | ICD-10-CM | POA: Diagnosis not present

## 2014-10-31 DIAGNOSIS — Z79899 Other long term (current) drug therapy: Secondary | ICD-10-CM

## 2014-10-31 DIAGNOSIS — K3532 Acute appendicitis with perforation and localized peritonitis, without abscess: Secondary | ICD-10-CM | POA: Diagnosis present

## 2014-10-31 DIAGNOSIS — K3580 Unspecified acute appendicitis: Secondary | ICD-10-CM | POA: Diagnosis present

## 2014-10-31 DIAGNOSIS — Z7982 Long term (current) use of aspirin: Secondary | ICD-10-CM | POA: Diagnosis not present

## 2014-10-31 DIAGNOSIS — E785 Hyperlipidemia, unspecified: Secondary | ICD-10-CM | POA: Diagnosis present

## 2014-10-31 DIAGNOSIS — Z01818 Encounter for other preprocedural examination: Secondary | ICD-10-CM

## 2014-10-31 DIAGNOSIS — M8580 Other specified disorders of bone density and structure, unspecified site: Secondary | ICD-10-CM | POA: Diagnosis present

## 2014-10-31 DIAGNOSIS — R630 Anorexia: Secondary | ICD-10-CM | POA: Diagnosis present

## 2014-10-31 DIAGNOSIS — Z86010 Personal history of colonic polyps: Secondary | ICD-10-CM | POA: Diagnosis not present

## 2014-10-31 HISTORY — DX: Pneumonia, unspecified organism: J18.9

## 2014-10-31 HISTORY — DX: Acute appendicitis with perforation, localized peritonitis, and gangrene, without abscess: K35.32

## 2014-10-31 HISTORY — DX: Unspecified convulsions: R56.9

## 2014-10-31 HISTORY — DX: Personal history of other diseases of the digestive system: Z87.19

## 2014-10-31 HISTORY — DX: Family history of other specified conditions: Z84.89

## 2014-10-31 HISTORY — DX: Unspecified chronic bronchitis: J42

## 2014-10-31 HISTORY — DX: Personal history of peptic ulcer disease: Z87.11

## 2014-10-31 HISTORY — DX: Calculus of kidney: N20.0

## 2014-10-31 HISTORY — DX: Bilateral primary osteoarthritis of knee: M17.0

## 2014-10-31 HISTORY — DX: Migraine, unspecified, not intractable, without status migrainosus: G43.909

## 2014-10-31 HISTORY — PX: LAPAROSCOPIC APPENDECTOMY: SHX408

## 2014-10-31 HISTORY — DX: Gastro-esophageal reflux disease without esophagitis: K21.9

## 2014-10-31 LAB — BASIC METABOLIC PANEL
Anion gap: 10 (ref 5–15)
BUN: 8 mg/dL (ref 6–23)
CO2: 25 mmol/L (ref 19–32)
Calcium: 8.4 mg/dL (ref 8.4–10.5)
Chloride: 104 mmol/L (ref 96–112)
Creatinine, Ser: 0.63 mg/dL (ref 0.50–1.10)
GFR calc Af Amer: >90 mL/min (ref >90)
GFR calc non Af Amer: >90 mL/min (ref >90)
Glucose, Bld: 99 mg/dL (ref 70–99)
Potassium: 3.8 mmol/L (ref 3.5–5.1)
Sodium: 139 mmol/L (ref 135–145)

## 2014-10-31 LAB — CBC WITH DIFFERENTIAL/PLATELET
Basophils Absolute: 0 10*3/uL (ref 0.0–0.1)
Basophils Relative: 0 % (ref 0–1)
Eosinophils Absolute: 0.1 10*3/uL (ref 0.0–0.7)
Eosinophils Relative: 2 % (ref 0–5)
HCT: 36.9 % (ref 36.0–46.0)
Hemoglobin: 12.3 g/dL (ref 12.0–15.0)
Lymphocytes Relative: 16 % (ref 12–46)
Lymphs Abs: 0.9 10*3/uL (ref 0.7–4.0)
MCH: 29.6 pg (ref 26.0–34.0)
MCHC: 33.3 g/dL (ref 30.0–36.0)
MCV: 88.7 fL (ref 78.0–100.0)
Monocytes Absolute: 0.3 10*3/uL (ref 0.1–1.0)
Monocytes Relative: 6 % (ref 3–12)
Neutro Abs: 4.2 10*3/uL (ref 1.7–7.7)
Neutrophils Relative %: 76 % (ref 43–77)
Platelets: 208 10*3/uL (ref 150–400)
RBC: 4.16 MIL/uL (ref 3.87–5.11)
RDW: 13.3 % (ref 11.5–15.5)
WBC: 5.6 10*3/uL (ref 4.0–10.5)

## 2014-10-31 LAB — URINALYSIS, ROUTINE W REFLEX MICROSCOPIC
Bilirubin Urine: NEGATIVE
Glucose, UA: NEGATIVE mg/dL
Hgb urine dipstick: NEGATIVE
Ketones, ur: NEGATIVE mg/dL
Leukocytes, UA: NEGATIVE
Nitrite: NEGATIVE
Protein, ur: NEGATIVE mg/dL
Specific Gravity, Urine: 1.014 (ref 1.005–1.030)
Urobilinogen, UA: 0.2 mg/dL (ref 0.0–1.0)
pH: 7 (ref 5.0–8.0)

## 2014-10-31 SURGERY — APPENDECTOMY, LAPAROSCOPIC
Anesthesia: General | Site: Abdomen

## 2014-10-31 MED ORDER — ONDANSETRON HCL 4 MG/2ML IJ SOLN
INTRAMUSCULAR | Status: DC | PRN
  Administered 2014-10-31: 4 mg via INTRAVENOUS

## 2014-10-31 MED ORDER — SUCCINYLCHOLINE CHLORIDE 20 MG/ML IJ SOLN
INTRAMUSCULAR | Status: DC | PRN
  Administered 2014-10-31: 100 mg via INTRAVENOUS

## 2014-10-31 MED ORDER — HYDROMORPHONE HCL 1 MG/ML IJ SOLN
0.2500 mg | INTRAMUSCULAR | Status: DC | PRN
  Administered 2014-10-31 (×2): 0.5 mg via INTRAVENOUS

## 2014-10-31 MED ORDER — 0.9 % SODIUM CHLORIDE (POUR BTL) OPTIME
TOPICAL | Status: DC | PRN
  Administered 2014-10-31: 1000 mL

## 2014-10-31 MED ORDER — ARTIFICIAL TEARS OP OINT
TOPICAL_OINTMENT | OPHTHALMIC | Status: DC | PRN
  Administered 2014-10-31: 1 via OPHTHALMIC

## 2014-10-31 MED ORDER — ONDANSETRON HCL 4 MG/2ML IJ SOLN
4.0000 mg | Freq: Once | INTRAMUSCULAR | Status: AC
  Administered 2014-10-31: 4 mg via INTRAVENOUS
  Filled 2014-10-31: qty 2

## 2014-10-31 MED ORDER — SODIUM CHLORIDE 0.9 % IV BOLUS (SEPSIS)
1000.0000 mL | Freq: Once | INTRAVENOUS | Status: AC
  Administered 2014-10-31: 1000 mL via INTRAVENOUS

## 2014-10-31 MED ORDER — ONDANSETRON HCL 4 MG PO TABS
4.0000 mg | ORAL_TABLET | Freq: Four times a day (QID) | ORAL | Status: DC | PRN
  Administered 2014-11-01 – 2014-11-02 (×2): 4 mg via ORAL
  Filled 2014-10-31 (×2): qty 1

## 2014-10-31 MED ORDER — IBUPROFEN 600 MG PO TABS: 600.0000 mg | ORAL_TABLET | Freq: Three times a day (TID) | ORAL | Status: DC | PRN

## 2014-10-31 MED ORDER — FENTANYL CITRATE 0.05 MG/ML IJ SOLN
INTRAMUSCULAR | Status: DC | PRN
  Administered 2014-10-31 (×3): 50 ug via INTRAVENOUS
  Administered 2014-10-31: 100 ug via INTRAVENOUS

## 2014-10-31 MED ORDER — FENTANYL CITRATE 0.05 MG/ML IJ SOLN
INTRAMUSCULAR | Status: AC
  Filled 2014-10-31: qty 5

## 2014-10-31 MED ORDER — BUPIVACAINE HCL (PF) 0.25 % IJ SOLN
INTRAMUSCULAR | Status: AC
  Filled 2014-10-31: qty 30

## 2014-10-31 MED ORDER — MIDAZOLAM HCL 5 MG/5ML IJ SOLN
INTRAMUSCULAR | Status: DC | PRN
  Administered 2014-10-31: 2 mg via INTRAVENOUS

## 2014-10-31 MED ORDER — MIDAZOLAM HCL 2 MG/2ML IJ SOLN
INTRAMUSCULAR | Status: AC
  Filled 2014-10-31: qty 2

## 2014-10-31 MED ORDER — ROCURONIUM BROMIDE 100 MG/10ML IV SOLN
INTRAVENOUS | Status: DC | PRN
  Administered 2014-10-31: 10 mg via INTRAVENOUS
  Administered 2014-10-31: 5 mg via INTRAVENOUS
  Administered 2014-10-31: 10 mg via INTRAVENOUS
  Administered 2014-10-31: 5 mg via INTRAVENOUS

## 2014-10-31 MED ORDER — PROPOFOL 10 MG/ML IV BOLUS
INTRAVENOUS | Status: DC | PRN
  Administered 2014-10-31: 40 mg via INTRAVENOUS
  Administered 2014-10-31: 160 mg via INTRAVENOUS

## 2014-10-31 MED ORDER — SODIUM CHLORIDE 0.9 % IR SOLN
Status: DC | PRN
  Administered 2014-10-31 (×2): 1000 mL

## 2014-10-31 MED ORDER — ENOXAPARIN SODIUM 40 MG/0.4ML ~~LOC~~ SOLN
40.0000 mg | SUBCUTANEOUS | Status: DC
  Administered 2014-11-01 – 2014-11-02 (×2): 40 mg via SUBCUTANEOUS
  Filled 2014-10-31 (×2): qty 0.4

## 2014-10-31 MED ORDER — ONDANSETRON HCL 4 MG/2ML IJ SOLN
4.0000 mg | Freq: Four times a day (QID) | INTRAMUSCULAR | Status: DC | PRN
  Administered 2014-11-02: 4 mg via INTRAVENOUS
  Filled 2014-10-31: qty 2

## 2014-10-31 MED ORDER — METRONIDAZOLE IN NACL 5-0.79 MG/ML-% IV SOLN
500.0000 mg | Freq: Three times a day (TID) | INTRAVENOUS | Status: DC
  Administered 2014-10-31 – 2014-11-02 (×5): 500 mg via INTRAVENOUS
  Filled 2014-10-31 (×8): qty 100

## 2014-10-31 MED ORDER — MORPHINE SULFATE 4 MG/ML IJ SOLN
4.0000 mg | Freq: Once | INTRAMUSCULAR | Status: AC
  Administered 2014-10-31: 4 mg via INTRAVENOUS
  Filled 2014-10-31: qty 1

## 2014-10-31 MED ORDER — ONDANSETRON HCL 4 MG/2ML IJ SOLN
INTRAMUSCULAR | Status: AC
  Filled 2014-10-31: qty 2

## 2014-10-31 MED ORDER — MORPHINE SULFATE 2 MG/ML IJ SOLN: 1.0000 mg | INTRAMUSCULAR | Status: DC | PRN

## 2014-10-31 MED ORDER — DEXTROSE 5 % IV SOLN
2.0000 g | INTRAVENOUS | Status: DC
  Administered 2014-10-31 – 2014-11-01 (×2): 2 g via INTRAVENOUS
  Filled 2014-10-31 (×3): qty 2

## 2014-10-31 MED ORDER — PROMETHAZINE HCL 25 MG/ML IJ SOLN: 6.2500 mg | INTRAMUSCULAR | Status: DC | PRN

## 2014-10-31 MED ORDER — IOHEXOL 300 MG/ML  SOLN
100.0000 mL | Freq: Once | INTRAMUSCULAR | Status: AC | PRN
  Administered 2014-10-31: 100 mL via INTRAVENOUS

## 2014-10-31 MED ORDER — KCL IN DEXTROSE-NACL 20-5-0.45 MEQ/L-%-% IV SOLN
INTRAVENOUS | Status: DC
  Administered 2014-10-31: 20:00:00 via INTRAVENOUS
  Filled 2014-10-31 (×4): qty 1000

## 2014-10-31 MED ORDER — GLYCOPYRROLATE 0.2 MG/ML IJ SOLN
INTRAMUSCULAR | Status: DC | PRN
  Administered 2014-10-31: 0.4 mg via INTRAVENOUS

## 2014-10-31 MED ORDER — OXYCODONE-ACETAMINOPHEN 5-325 MG PO TABS
1.0000 | ORAL_TABLET | ORAL | Status: DC | PRN
  Administered 2014-10-31 – 2014-11-02 (×6): 2 via ORAL
  Administered 2014-11-02: 1 via ORAL
  Administered 2014-11-02: 2 via ORAL
  Filled 2014-10-31 (×8): qty 2

## 2014-10-31 MED ORDER — HYDROMORPHONE HCL 1 MG/ML IJ SOLN
INTRAMUSCULAR | Status: AC
  Filled 2014-10-31: qty 1

## 2014-10-31 MED ORDER — NEOSTIGMINE METHYLSULFATE 10 MG/10ML IV SOLN
INTRAVENOUS | Status: DC | PRN
  Administered 2014-10-31: 3 mg via INTRAVENOUS

## 2014-10-31 MED ORDER — LACTATED RINGERS IV SOLN
INTRAVENOUS | Status: DC | PRN
  Administered 2014-10-31 (×2): via INTRAVENOUS

## 2014-10-31 MED ORDER — SCOPOLAMINE 1 MG/3DAYS TD PT72
MEDICATED_PATCH | TRANSDERMAL | Status: DC | PRN
  Administered 2014-10-31: 1 via TRANSDERMAL

## 2014-10-31 MED ORDER — LIDOCAINE HCL (CARDIAC) 20 MG/ML IV SOLN
INTRAVENOUS | Status: DC | PRN
  Administered 2014-10-31: 60 mg via INTRAVENOUS

## 2014-10-31 MED ORDER — BUPIVACAINE HCL (PF) 0.25 % IJ SOLN
INTRAMUSCULAR | Status: DC | PRN
  Administered 2014-10-31: 30 mL

## 2014-10-31 MED ORDER — LACTATED RINGERS IV SOLN
INTRAVENOUS | Status: DC
  Administered 2014-10-31: 14:00:00 via INTRAVENOUS

## 2014-10-31 SURGICAL SUPPLY — 49 items
APPLIER CLIP ROT 10 11.4 M/L (STAPLE)
BLADE SURG 15 STRL LF DISP TIS (BLADE) ×1 IMPLANT
BLADE SURG 15 STRL SS (BLADE) ×1
BLADE SURG ROTATE 9660 (MISCELLANEOUS) IMPLANT
CANISTER SUCTION 2500CC (MISCELLANEOUS) ×2 IMPLANT
CHLORAPREP W/TINT 26ML (MISCELLANEOUS) ×2 IMPLANT
CLIP APPLIE ROT 10 11.4 M/L (STAPLE) IMPLANT
COVER SURGICAL LIGHT HANDLE (MISCELLANEOUS) ×2 IMPLANT
CUTTER FLEX LINEAR 45M (STAPLE) ×2 IMPLANT
DRAPE LAPAROSCOPIC ABDOMINAL (DRAPES) ×2 IMPLANT
ELECT REM PT RETURN 9FT ADLT (ELECTROSURGICAL) ×2
ELECTRODE REM PT RTRN 9FT ADLT (ELECTROSURGICAL) ×1 IMPLANT
ENDOLOOP SUT PDS II  0 18 (SUTURE)
ENDOLOOP SUT PDS II 0 18 (SUTURE) IMPLANT
GLOVE BIOGEL PI IND STRL 6.5 (GLOVE) ×1 IMPLANT
GLOVE BIOGEL PI IND STRL 8 (GLOVE) ×2 IMPLANT
GLOVE BIOGEL PI INDICATOR 6.5 (GLOVE) ×1
GLOVE BIOGEL PI INDICATOR 8 (GLOVE) ×2
GLOVE SURG SIGNA 7.5 PF LTX (GLOVE) ×2 IMPLANT
GLOVE SURG SS PI 6.5 STRL IVOR (GLOVE) ×2 IMPLANT
GLOVE SURG SS PI 7.0 STRL IVOR (GLOVE) ×2 IMPLANT
GOWN STRL REUS W/ TWL LRG LVL3 (GOWN DISPOSABLE) ×2 IMPLANT
GOWN STRL REUS W/ TWL XL LVL3 (GOWN DISPOSABLE) ×1 IMPLANT
GOWN STRL REUS W/TWL LRG LVL3 (GOWN DISPOSABLE) ×2
GOWN STRL REUS W/TWL XL LVL3 (GOWN DISPOSABLE) ×1
KIT BASIN OR (CUSTOM PROCEDURE TRAY) ×2 IMPLANT
KIT ROOM TURNOVER OR (KITS) ×2 IMPLANT
LIQUID BAND (GAUZE/BANDAGES/DRESSINGS) ×2 IMPLANT
NS IRRIG 1000ML POUR BTL (IV SOLUTION) ×2 IMPLANT
PAD ARMBOARD 7.5X6 YLW CONV (MISCELLANEOUS) ×4 IMPLANT
POUCH SPECIMEN RETRIEVAL 10MM (ENDOMECHANICALS) ×2 IMPLANT
RELOAD 45 VASCULAR/THIN (ENDOMECHANICALS) IMPLANT
RELOAD STAPLE TA45 3.5 REG BLU (ENDOMECHANICALS) ×2 IMPLANT
SCALPEL HARMONIC ACE (MISCELLANEOUS) ×2 IMPLANT
SET IRRIG TUBING LAPAROSCOPIC (IRRIGATION / IRRIGATOR) ×2 IMPLANT
SLEEVE ENDOPATH XCEL 5M (ENDOMECHANICALS) ×2 IMPLANT
SPECIMEN JAR SMALL (MISCELLANEOUS) ×2 IMPLANT
SUT MON AB 5-0 PS2 18 (SUTURE) ×2 IMPLANT
SUT VIC AB 3-0 SH 27 (SUTURE) ×1
SUT VIC AB 3-0 SH 27X BRD (SUTURE) ×1 IMPLANT
SUT VICRYL 0 UR6 27IN ABS (SUTURE) IMPLANT
TOWEL OR 17X24 6PK STRL BLUE (TOWEL DISPOSABLE) ×2 IMPLANT
TOWEL OR 17X26 10 PK STRL BLUE (TOWEL DISPOSABLE) ×2 IMPLANT
TRAY FOLEY CATH 16FR SILVER (SET/KITS/TRAYS/PACK) ×2 IMPLANT
TRAY LAPAROSCOPIC (CUSTOM PROCEDURE TRAY) ×2 IMPLANT
TROCAR XCEL BLUNT TIP 100MML (ENDOMECHANICALS) ×2 IMPLANT
TROCAR XCEL NON-BLD 11X100MML (ENDOMECHANICALS) ×2 IMPLANT
TROCAR XCEL NON-BLD 5MMX100MML (ENDOMECHANICALS) ×2 IMPLANT
TUBING INSUFFLATION (TUBING) ×2 IMPLANT

## 2014-10-31 NOTE — Anesthesia Postprocedure Evaluation (Signed)
  Anesthesia Post-op Note  Patient: Karen Barrera  Procedure(s) Performed: Procedure(s): APPENDECTOMY LAPAROSCOPIC (N/A)  Patient Location: PACU  Anesthesia Type:General  Level of Consciousness: awake  Airway and Oxygen Therapy: Patient Spontanous Breathing  Post-op Pain: mild  Post-op Assessment: Post-op Vital signs reviewed  Post-op Vital Signs: Reviewed  Last Vitals:  Filed Vitals:   10/31/14 1615  BP: 119/68  Pulse: 63  Temp:   Resp: 11    Complications: No apparent anesthesia complications

## 2014-10-31 NOTE — Anesthesia Preprocedure Evaluation (Signed)
Anesthesia Evaluation  Patient identified by MRN, date of birth, ID band Patient awake    Reviewed: Allergy & Precautions, NPO status , Patient's Chart, lab work & pertinent test results  Airway Mallampati: II  TM Distance: >3 FB Neck ROM: Full    Dental   Pulmonary shortness of breath, Current Smoker,  breath sounds clear to auscultation        Cardiovascular negative cardio ROS  Rhythm:Regular Rate:Normal     Neuro/Psych    GI/Hepatic GI history noted. CE   Endo/Other    Renal/GU      Musculoskeletal   Abdominal   Peds  Hematology   Anesthesia Other Findings   Reproductive/Obstetrics                             Anesthesia Physical Anesthesia Plan  ASA: II  Anesthesia Plan: General   Post-op Pain Management:    Induction: Intravenous  Airway Management Planned: Oral ETT  Additional Equipment:   Intra-op Plan:   Post-operative Plan: Extubation in OR  Informed Consent: I have reviewed the patients History and Physical, chart, labs and discussed the procedure including the risks, benefits and alternatives for the proposed anesthesia with the patient or authorized representative who has indicated his/her understanding and acceptance.   Dental advisory given  Plan Discussed with: CRNA  Anesthesia Plan Comments:         Anesthesia Quick Evaluation

## 2014-10-31 NOTE — Op Note (Signed)
Re:   Karen Barrera DOB:   06/17/1951 MRN:   [id number]                   FACILITY:  [facility]  DATE OF PROCEDURE: 10/31/2014                              OPERATIVE REPORT  PREOPERATIVE DIAGNOSIS:  Appendicitis  POSTOPERATIVE DIAGNOSIS:  Acute supurative appendicitis with focal perforation.  (photos are in Epic)  PROCEDURE:  Laparoscopic appendectomy.  SURGEON:  Fenton Malling. Lucia Gaskins, MD  ASSISTANT:  No first assistant.  ANESTHESIA:  General endotracheal.  Anesthesiologist: Finis Bud, MD CRNA: Gaylene Brooks, CRNA; Gean Maidens, CRNA  ASA:  2E  ESTIMATED BLOOD LOSS:  Minimal.  DRAINS: none   SPECIMEN:   Appendix  COUNTS CORRECT:  YES  INDICATIONS FOR PROCEDURE: Karen Barrera is a 64 y.o. (DOB: 05-05-1951) white  female whose primary care doctor is MCKEOWN,WILLIAM Charma Mocarski, MD and comes to the OR for an appendectomy.   I discussed with the patient, the indications and potential complications of appendiceal surgery.  The potential complications include, but are not limited to, bleeding, open surgery, bowel resection, and the possibility of another diagnosis.  OPERATIVE NOTE:  The patient underwent a general endotracheal anesthetic as supervised by Anesthesiologist: Finis Bud, MD CRNA: Gaylene Brooks, CRNA; Gean Maidens, CRNA, General, in room #1 at [facility].  The patient was rocephin/flagyl prior to the beginning of the procedure and the abdomen was prepped with ChloraPrep.  The patient had a foley catheter placed at the beginning of the procedure.  A time-out was held and surgical checklist run.  An infraumbilical incision was made with sharp dissection carried down to the abdominal cavity.  An 12 mm Hasson trocar was inserted through the infraumbilical incision and into the peritoneal cavity.  A 0 degree 10 mm laparoscope was inserted through a 12 mm Hasson trocar and the Hasson trocar secured with a 0 Vicryl suture.  I placed a 5 mm trocar in the right  upper quadrant and 11 mm torcar in left lower quadrant and did abdominal exploration.   I placed an additional right lower quadrant port for exposure.  The right and left lobes of liver unremarkable.  Stomach was unremarkable.  The pelvic organs were unremarkable.  I saw no other intra-abdominal abnormality.  The patient had appendicitis with the appendix located at the right pelvic brim.  The small bowel was stuck to the right pelvic sidewall and the appendix was tucked in under this.  I had to mobilize the terminal ileum off the lateral side wall of the pelvis to get to the appendix.    I started by dividing the base of the appendix.  I then used a blue load 45 mm Ethicon Endo-GIA stapler and fired this across the base of the appendix. Then I could get to the mesentery of the appendix.  The mesentery of the appendix was divided with a Harmonic scalpel. I placed the appendix in EndoCatch bag and delivered the bag through the umbilical incision.  I irrigated the abdomen with 2,000 cc of saline.  After irrigating the abdomen, I then removed the trocars, in turn.  The umbilical port fascia was closed with 0 Vicryl suture.   I closed the skin each site with a 5-0 Monocryl suture and painted the wounds with Dermabond.  I then injected a total of  30 mL of 0.25% Marcaine at the incisions.  Sponge and needle count were correct at the end of the case.  The foley catheter was removed in the OR.  The patient was transferred to the recovery room in good condition.  The patient tolerated the procedure well and it depends on the patient's post op clinical course as to when the patient could be discharged.   Karen Overall, MD, [facility] [place] [facility] Pager: [phone] Office phone:  [phone]

## 2014-10-31 NOTE — ED Provider Notes (Signed)
CSN: [id number]     Arrival date & time 10/31/14  1037 History   First MD Initiated Contact with Patient 10/31/14 1131     Chief Complaint  Patient presents with  . Abdominal Pain     (Consider location/radiation/quality/duration/timing/severity/associated sxs/prior Treatment) HPI Karen Barrera is a 64 year old female with past medical history of colon polyps, anemia, osteopenia, hyperlipidemia who presents the ER after being sent by her gastroenterology office for a confirmed appendicitis by CT scan. Patient states she began having generalized abdominal pain last Thursday, 6 days ago. She states the pain persisted, and she attempted using stool softeners and enemas because she thought she was constipated. She states these provided no relief of her pain. She states yesterday, Monday she went to her gastroenterology office and saw the PA at Dr. Ardis Hughs office. She states she was worked up there, and was scheduled for an outpatient CT abdomen pelvis this morning. After her imaging was done this morning, she was called back by Dr. Ardis Hughs office and notified that there was a confirmed appendicitis. They recommended she go to the emergency room for further evaluation. Patient denies having any associated nausea, vomiting, diarrhea, dysuria, fever.  Past Medical History  Diagnosis Date  . Allergy   . Arthritis   . Depression   . Hyperlipidemia   . Osteopenia   . Anemia   . Pre-diabetes   . Vitamin D deficiency   . Tobacco dependence   . Colon polyp   . Allergic rhinitis   . DDD (degenerative disc disease)   . DDD (degenerative disc disease), cervical   . DDD (degenerative disc disease), lumbar    Past Surgical History  Procedure Laterality Date  . Shoulder surgery  2012    left; rotator cuff  . Elbow surgery  10/2009    left/torn tendon  . Shoulder surgery  2003    right; removal spurs  . Oophorectomy  1999    right  . Partial vaginal hysterectomy  1978  . Sinus exploration  12/2013     [name]  . Abdominal hysterectomy     Family History  Problem Relation Age of Onset  . Stomach cancer Neg Hx   . Colon cancer Other 68  . Colon cancer Other 14  . Diabetes Mother   . Cancer Mother     skin  . Seizures Sister   . Cancer Maternal Aunt     breast  . Cancer Paternal Aunt     breast  . Cancer Paternal Uncle     colon  . Stroke Maternal Grandmother   . Hyperlipidemia Maternal Grandfather   . Cancer Paternal Grandmother     breast  . Arthritis Paternal Grandmother    History  Substance Use Topics  . Smoking status: Current Every Day Smoker -- 0.50 packs/day for 40 years    Types: Cigarettes  . Smokeless tobacco: Never Used     Comment: Pt quit smoking during pregnancies, also 3 yrs ago for 6 mos.  . Alcohol Use: No   OB History    No data available     Review of Systems  Constitutional: Negative for fever.  HENT: Negative for trouble swallowing.   Eyes: Negative for visual disturbance.  Respiratory: Negative for shortness of breath.   Cardiovascular: Negative for chest pain.  Gastrointestinal: Positive for abdominal pain. Negative for nausea and vomiting.  Genitourinary: Negative for dysuria.  Musculoskeletal: Negative for neck pain.  Skin: Negative for rash.  Neurological: Negative for dizziness, weakness  and numbness.  Psychiatric/Behavioral: Negative.       Allergies  Avelox and Chantix  Home Medications   Prior to Admission medications   Medication Sig Start Date End Date Taking? Authorizing Provider  albuterol (PROAIR HFA) 108 (90 BASE) MCG/ACT inhaler INHALE 2 PUFFS INTO THE LUNGS EVERY 4 HOURS AS NEEDED FOR WHEEZING OR SHORTNESS OF BREATH. 06/15/14  Yes Melissa Smith, PA-C  aspirin 81 MG tablet Take 81 mg by mouth daily.   Yes Historical Provider, MD  B Complex-C (SUPER B COMPLEX PO) Take by mouth daily.   Yes Historical Provider, MD  Cholecalciferol (VITAMIN D-3) 5000 UNITS TABS Take by mouth daily.   Yes Historical Provider, MD   Coenzyme Q10 (COQ10) 200 MG CAPS Take 200 mg by mouth daily.   Yes Historical Provider, MD  CRESTOR 10 MG tablet TAKE 1 TABLET BY MOUTH EVERY DAY 10/08/14  Yes [name], MD  diphenhydrAMINE (BENADRYL) 25 MG tablet Take 25 mg by mouth every 6 (six) hours as needed.   Yes Historical Provider, MD  Magnesium 300 MG CAPS Take by mouth daily.   Yes Historical Provider, MD  Melatonin 5 MG CAPS Take 2 capsules by mouth at bedtime.    Yes Historical Provider, MD  montelukast (SINGULAIR) 10 MG tablet Take 1 tablet (10 mg total) by mouth at bedtime. 07/31/14  Yes [name], MD  amoxicillin-clavulanate (AUGMENTIN ES-600) 600-42.9 MG/5ML suspension Take 7.77ml twice a day for 7 days Patient not taking: Reported on 10/31/2014 10/30/14   Lori P Hvozdovic, PA-C  buPROPion (WELLBUTRIN XL) 150 MG 24 hr tablet Take 1 tablet (150 mg total) by mouth 2 (two) times daily. Start by taking 1 tablet daily x 1 week. Then increase to twice daily. Patient not taking: Reported on 10/31/2014 06/15/14 06/15/15  Kelby Aline, PA-C  co-enzyme Q-10 30 MG capsule Take 30 mg by mouth daily. Takes 200 mg daily    Historical Provider, MD  EPINEPHrine (EPI-PEN) 0.3 mg/0.3 mL SOAJ injection Inject 0.3 mLs (0.3 mg total) into the muscle once. 09/02/13   Kelby Aline, PA-C  Probiotic Product (PROBIOTIC DAILY PO) Take 1 tablet by mouth daily.    Historical Provider, MD   BP 107/73 mmHg  Pulse 64  Temp(Src) 97.9 F (36.6 C) (Oral)  Resp 16  Ht 5' 4.25" (1.632 m)  Wt 164 lb (74.39 kg)  BMI 27.93 kg/m2  SpO2 99% Physical Exam  Constitutional: She is oriented to person, place, and time. She appears well-developed and well-nourished. No distress.  HENT:  Head: Normocephalic and atraumatic.  Mouth/Throat: Oropharynx is clear and moist. No oropharyngeal exudate.  Eyes: Right eye exhibits no discharge. Left eye exhibits no discharge. No scleral icterus.  Neck: Normal range of motion.  Cardiovascular: Normal rate, regular rhythm, S1  normal, S2 normal, normal heart sounds and normal pulses.   No murmur heard. Pulmonary/Chest: Effort normal and breath sounds normal. No accessory muscle usage. No tachypnea. No respiratory distress.  Abdominal: Soft. There is tenderness in the periumbilical area. There is tenderness at McBurney's point. There is no rigidity, no guarding and negative Murphy's sign.  Musculoskeletal: Normal range of motion. She exhibits no edema or tenderness.  Neurological: She is alert and oriented to person, place, and time. She has normal strength. No cranial nerve deficit or sensory deficit. Coordination normal. GCS eye subscore is 4. GCS verbal subscore is 5. GCS motor subscore is 6.  Skin: Skin is warm and dry. No rash noted. She is not diaphoretic.  Psychiatric: She has a normal mood and affect.  Nursing note and vitals reviewed.   ED Course  Procedures (including critical care time) Labs Review Labs Reviewed  CBC WITH DIFFERENTIAL/PLATELET  BASIC METABOLIC PANEL  URINALYSIS, ROUTINE W REFLEX MICROSCOPIC    Imaging Review Ct Abdomen Pelvis W Contrast  10/31/2014   CLINICAL DATA:  Persistent LEFT lower quadrant pain, tenderness and constipation for 6 days, family history of colon cancer, evaluate for diverticulitis, past history of smoking and hyperlipidemia  EXAM: CT ABDOMEN AND PELVIS WITH CONTRAST  TECHNIQUE: Multidetector CT imaging of the abdomen and pelvis was performed using the standard protocol following bolus administration of intravenous contrast. Sagittal and coronal MPR images reconstructed from axial data set.  CONTRAST:  126mL OMNIPAQUE IOHEXOL 300 MG/ML SOLN IV. Dilute oral contrast.  COMPARISON:  None.  FINDINGS: Lung bases clear.  Cyst superiorly and posteriorly in RIGHT lobe liver 2.6 x 1.9 cm image 9.  14 x 9 mm nodule LEFT adrenal gland demonstrating marked washout on delayed images compatible with adrenal adenoma.  Liver, spleen, pancreas, kidneys, and adrenal glands otherwise normal.   Unremarkable bladder and LEFT ureter.  RIGHT ureter appears mildly dilated extending to an area of inflammatory changes in the RIGHT pelvis, with wall thickening of segments of the distal sigmoid colon and in adjacent small bowel loop.  Proximal appendix is normal in caliber with distal appendix enlarged and extending into the area of inflammatory changes in the RIGHT pelvis.  Findings are suspicious for distal appendicitis.  No definite evidence of abscess or perforation though with the degree of inflammatory changes present not excluded.  Remaining bowel loops unremarkable.  No mass, adenopathy, free air, or hernia.  Bones demineralized with degenerative disc disease changes at L5-S1 and scattered endplate concavities and lumbar spine.  IMPRESSION: Enlargement and wall thickening of the distal appendix with surrounding inflammatory changes in the RIGHT pelvis high suspicious for distal appendicitis.  No definite evidence of abscess or perforation though not excluded due to degree of inflammation identified.  Associated wall thickening of a small bowel loop and distal sigmoid colon as well as mild dilatation of proximal to mid RIGHT ureter, all likely related to inflammatory changes in RIGHT pelvis.  Hepatic cyst.  LEFT adrenal adenoma.  Findings called to [place] at [place] on 10/31/2014 at 0950 hours.   Electronically Signed   By: Lavonia Dana M.D.   On: 10/31/2014 09:54     EKG Interpretation None      MDM   Final diagnoses:  Pre-op evaluation    Pt here with appendicitis diagnosed by outpatient CT scan earlier today. Patient's gastroenterology office recommended patient be seen in the ED. Patient is well-appearing, in no acute distress on exam. Patient has generalized abdominal pain. CT results reviewed, patient's labs will be redrawn here today. I spoke with general surgery who agreed to see patient in consultation.  Patient seen and evaluated by general surgery who recommended follow-up  with appendectomy and admission to surgery.  The patient appears reasonably stabilized for admission considering the current resources, flow, and capabilities available in the ED at this time, and I doubt any other [facility] requiring further screening and/or treatment in the ED prior to admission.   BP 107/73 mmHg  Pulse 64  Temp(Src) 97.9 F (36.6 C) (Oral)  Resp 16  Ht 5' 4.25" (1.632 m)  Wt 164 lb (74.39 kg)  BMI 27.93 kg/m2  SpO2 99%  Signed,  Dahlia Bailiff, PA-C 1:49 PM  Patient seen and discussed with Dr. Charlesetta Shanks, M.D.    Dahlia Bailiff, PA-C 10/31/14 [place], MD 10/31/14 [phone]

## 2014-10-31 NOTE — Progress Notes (Signed)
ADMITTED TO 6N10 FROM pacu, A/OX4, GAIT STEADY, ORIENTED TO ROOM AND SURROUNDINGS

## 2014-10-31 NOTE — H&P (Signed)
Karen Barrera May 19, 1951  [id number].   Primary Care MD: Dr. Kyra Leyland Chief Complaint/Reason for Consult: acute appendicitis HPI: this is a 64 yo white female who has a family hx of colon cancer and gets cscopes q3 years with a history of polyps, but not malignancies or diverticular disease.  She began having abdominal pain in the LLQ and mid abdomen last  Thursday.  She became somewhat anorexic.  She has had only 2 good meals since last Thursday.  She has been drinking a lot of water.  No nausea or vomiting, but diarrhea, only because she gave herself and enema and took laxatives prior to her scan today per GI's recommendations.  Due to persistent pain, she saw Cecille Rubin with [facility] yesterday.  There was a question of diverticulitis, and so she was sent for a CT scan today.  Once she got home, she was called to come back to the ED for acute appendicitis.  We have been asked to see her for further recommendations.  Her CT scan does show an appendix deep in her pelvis and somewhat posterior.  The distal aspect is inflamed causing secondary inflammation of her sigmoid colon and her right ureter.  ROS : Please see HPI, otherwise all other systems are negative  Family History  Problem Relation Age of Onset  . Stomach cancer Neg Hx   . Colon cancer Other 78  . Colon cancer Other 33  . Diabetes Mother   . Cancer Mother     skin  . Seizures Sister   . Cancer Maternal Aunt     breast  . Cancer Paternal Aunt     breast  . Cancer Paternal Uncle     colon  . Stroke Maternal Grandmother   . Hyperlipidemia Maternal Grandfather   . Cancer Paternal Grandmother     breast  . Arthritis Paternal Grandmother     Past Medical History  Diagnosis Date  . Allergy   . Arthritis   . Depression   . Hyperlipidemia   . Osteopenia   . Anemia   . Pre-diabetes   . Vitamin D deficiency   . Tobacco dependence   . Colon polyp   . Allergic rhinitis   . DDD (degenerative disc disease)   . DDD  (degenerative disc disease), cervical   . DDD (degenerative disc disease), lumbar     Past Surgical History  Procedure Laterality Date  . Shoulder surgery  2012    left; rotator cuff  . Elbow surgery  10/2009    left/torn tendon  . Shoulder surgery  2003    right; removal spurs  . Oophorectomy  1999    right  . Partial vaginal hysterectomy  1978  . Sinus exploration  12/2013    [name]  . Abdominal hysterectomy      Social History:  reports that she has been smoking Cigarettes.  She has a 20 pack-year smoking history. She has never used smokeless tobacco. She reports that she does not drink alcohol or use illicit drugs.  Allergies:  Allergies  Allergen Reactions  . Avelox [Moxifloxacin Hcl In Nacl] Other (See Comments)    aches  . Chantix [Varenicline]      (Not in a hospital admission)  Blood pressure 102/63, pulse 72, temperature 97.9 F (36.6 C), temperature source Oral, resp. rate 16, height 5' 4.25" (1.632 m), weight 74.39 kg (164 lb), SpO2 98 %. Physical Exam: General: pleasant, WD, WN white female who is laying in bed in NAD  HEENT: head is normocephalic, atraumatic.  Sclera are noninjected.  PERRL.  Ears and nose without any masses or lesions.  Mouth is pink and moist Heart: regular, rate, and rhythm.  Normal s1,s2. No obvious murmurs, gallops, or rubs noted.  Palpable radial and pedal pulses bilaterally Lungs: CTAB, no wheezes, rhonchi, or rales noted.  Respiratory effort nonlabored Abd: soft, greatest tenderness is in the LLQ, not very tender in RLQ, ND, +BS, no masses, hernias, or organomegaly, pfannenstiel incision noted MS: all 4 extremities are symmetrical with no cyanosis, clubbing, or edema. Skin: warm and dry with no masses, lesions, or rashes Psych: A&Ox3 with an appropriate affect.    Results for orders placed or performed during the hospital encounter of 10/31/14 (from the past 48 hour(s))  Urinalysis, Routine w reflex microscopic     Status: None    Collection Time: 10/31/14 12:01 PM  Result Value Ref Range   Color, Urine YELLOW YELLOW   APPearance CLEAR CLEAR   Specific Gravity, Urine 1.014 1.005 - 1.030   pH 7.0 5.0 - 8.0   Glucose, UA NEGATIVE NEGATIVE mg/dL   Hgb urine dipstick NEGATIVE NEGATIVE   Bilirubin Urine NEGATIVE NEGATIVE   Ketones, ur NEGATIVE NEGATIVE mg/dL   Protein, ur NEGATIVE NEGATIVE mg/dL   Urobilinogen, UA 0.2 0.0 - 1.0 mg/dL   Nitrite NEGATIVE NEGATIVE   Leukocytes, UA NEGATIVE NEGATIVE    Comment: MICROSCOPIC NOT DONE ON URINES WITH NEGATIVE PROTEIN, BLOOD, LEUKOCYTES, NITRITE, OR GLUCOSE <1000 mg/dL.   Ct Abdomen Pelvis W Contrast  10/31/2014   CLINICAL DATA:  Persistent LEFT lower quadrant pain, tenderness and constipation for 6 days, family history of colon cancer, evaluate for diverticulitis, past history of smoking and hyperlipidemia  EXAM: CT ABDOMEN AND PELVIS WITH CONTRAST  TECHNIQUE: Multidetector CT imaging of the abdomen and pelvis was performed using the standard protocol following bolus administration of intravenous contrast. Sagittal and coronal MPR images reconstructed from axial data set.  CONTRAST:  130mL OMNIPAQUE IOHEXOL 300 MG/ML SOLN IV. Dilute oral contrast.  COMPARISON:  None.  FINDINGS: Lung bases clear.  Cyst superiorly and posteriorly in RIGHT lobe liver 2.6 x 1.9 cm image 9.  14 x 9 mm nodule LEFT adrenal gland demonstrating marked washout on delayed images compatible with adrenal adenoma.  Liver, spleen, pancreas, kidneys, and adrenal glands otherwise normal.  Unremarkable bladder and LEFT ureter.  RIGHT ureter appears mildly dilated extending to an area of inflammatory changes in the RIGHT pelvis, with wall thickening of segments of the distal sigmoid colon and in adjacent small bowel loop.  Proximal appendix is normal in caliber with distal appendix enlarged and extending into the area of inflammatory changes in the RIGHT pelvis.  Findings are suspicious for distal appendicitis.  No definite  evidence of abscess or perforation though with the degree of inflammatory changes present not excluded.  Remaining bowel loops unremarkable.  No mass, adenopathy, free air, or hernia.  Bones demineralized with degenerative disc disease changes at L5-S1 and scattered endplate concavities and lumbar spine.  IMPRESSION: Enlargement and wall thickening of the distal appendix with surrounding inflammatory changes in the RIGHT pelvis high suspicious for distal appendicitis.  No definite evidence of abscess or perforation though not excluded due to degree of inflammation identified.  Associated wall thickening of a small bowel loop and distal sigmoid colon as well as mild dilatation of proximal to mid RIGHT ureter, all likely related to inflammatory changes in RIGHT pelvis.  Hepatic cyst.  LEFT adrenal adenoma.  Findings called to [place] at [place] on 10/31/2014 at 0950 hours.   Electronically Signed   By: Lavonia Dana M.D.   On: 10/31/2014 09:54       Assessment/Plan 1. Acute appendicitis, ? Perforation given duration of symptoms -will admit, IVFs, IV abx therapy with Rocephin and Flagyl -to OR today for lap possible open appy -the procedure including risks and complications, and expected outcomes were d/w the patient and her husband.  She understands and is agreeable to proceed.  Risks and complications include, but are not limited to bleeding, infection, post op abscess, open procedure, ileocecectomy, anastomotic leak, post op drain placement, repeat operation, etc.  She understands and is agreeable. -labs, EKG, adn PCXR are currently pending.  WBC was normal yesterday along with Cr.  K was low at 3.2. 2. Tobacco abuse 3. Seasonal allergies  OSBORNE,KELLY E 10/31/2014, 12:55 PM Pager: [phone]  Somewhat unusual story, but appears to have appendicitis.  She has had a prior right oophorectomy about 30 years ago.  Her husband is at the bedside.  I discussed with the patient the indications and  risks of appendiceal surgery.  The primary risks of appendiceal surgery include, but are not limited to, bleeding, infection, bowel surgery, and open surgery.  There is also the risk that the patient may have continued symptoms after surgery.  However, the likelihood of improvement in symptoms and return to the patient's normal status is good. We discussed the typical post-operative recovery course. I tried to answer the patient's questions.  Alphonsa Overall, MD, [facility] Pager: [phone] Office phone:  [phone]

## 2014-10-31 NOTE — ED Notes (Signed)
Pt has been c/o lower abd pain since Thursday and went to see GI doctor this morning. They did a CT scan that showed appendicitis.

## 2014-10-31 NOTE — Anesthesia Procedure Notes (Signed)
Procedure Name: Intubation Date/Time: 10/31/2014 2:02 PM Performed by: Gaylene Brooks Pre-anesthesia Checklist: Patient being monitored, Suction available, Emergency Drugs available, Patient identified and Timeout performed Patient Re-evaluated:Patient Re-evaluated prior to inductionOxygen Delivery Method: Circle system utilized Preoxygenation: Pre-oxygenation with 100% oxygen Intubation Type: IV induction, Rapid sequence and Cricoid Pressure applied Laryngoscope Size: Miller and 2 Grade View: Grade I Tube type: Oral Tube size: 7.0 mm Number of attempts: 1 Airway Equipment and Method: Stylet Placement Confirmation: ETT inserted through vocal cords under direct vision,  breath sounds checked- equal and bilateral,  positive ETCO2 and CO2 detector Secured at: 22 cm Tube secured with: Tape Dental Injury: Teeth and Oropharynx as per pre-operative assessment

## 2014-10-31 NOTE — Progress Notes (Signed)
i agree with the above note, plan 

## 2014-10-31 NOTE — Transfer of Care (Signed)
Immediate Anesthesia Transfer of Care Note  Patient: Karen Barrera  Procedure(s) Performed: Procedure(s): APPENDECTOMY LAPAROSCOPIC (N/A)  Patient Location: PACU  Anesthesia Type:General  Level of Consciousness: awake and alert   Airway & Oxygen Therapy: Patient Spontanous Breathing and Patient connected to nasal cannula oxygen  Post-op Assessment: Report given to RN and Post -op Vital signs reviewed and stable  Post vital signs: Reviewed and stable  Last Vitals:  Filed Vitals:   10/31/14 1215  BP: 107/73  Pulse: 64  Temp:   Resp:     Complications: No apparent anesthesia complications

## 2014-10-31 NOTE — Telephone Encounter (Signed)
Received a call from Dr. Nita Sickle with CT report on paitent. Patient has distal appendicitis and inflammation changes in right side. Dr. Ardis Hughs aware and he wants patient to go to ED. Patient notified and she will go to ED.

## 2014-11-01 ENCOUNTER — Ambulatory Visit: Payer: 59 | Admitting: Internal Medicine

## 2014-11-01 ENCOUNTER — Encounter (HOSPITAL_COMMUNITY): Payer: Self-pay | Admitting: Surgery

## 2014-11-01 LAB — CBC
HCT: 34.4 % — ABNORMAL LOW (ref 36.0–46.0)
Hemoglobin: 11.5 g/dL — ABNORMAL LOW (ref 12.0–15.0)
MCH: 30.1 pg (ref 26.0–34.0)
MCHC: 33.4 g/dL (ref 30.0–36.0)
MCV: 90.1 fL (ref 78.0–100.0)
Platelets: 212 10*3/uL (ref 150–400)
RBC: 3.82 MIL/uL — ABNORMAL LOW (ref 3.87–5.11)
RDW: 13.6 % (ref 11.5–15.5)
WBC: 6.9 10*3/uL (ref 4.0–10.5)

## 2014-11-01 MED ORDER — WHITE PETROLATUM GEL
Status: AC
  Administered 2014-11-01: 14:00:00
  Filled 2014-11-01: qty 1

## 2014-11-01 NOTE — Progress Notes (Addendum)
[facility] Progress Note  1 Day Post-Op  Subjective: Pt feels pretty good.  Tolerating clears, no N/V.  Wants to eat more solid food.  Hasn't ambulated much yet.  No flatus yet or BM.  Objective: Vital signs in last 24 hours: Temp:  [97.4 F (36.3 C)-98.6 F (37 C)] 97.4 F (36.3 C) (04/06 0545) Pulse Rate:  [51-80] 51 (04/06 0545) Resp:  [11-37] 18 (04/06 0545) BP: (84-137)/(43-96) 94/49 mmHg (04/06 0545) SpO2:  [95 %-100 %] 95 % (04/06 0545) Weight:  [74.163 kg (163 lb 8 oz)-74.39 kg (164 lb)] 74.163 kg (163 lb 8 oz) (04/05 1802) Last BM Date: 10/30/14  Intake/Output from previous day: 04/05 0701 - 04/06 0700 In: 1240 [P.O.:240; I.V.:1000] Out: 1725 [Urine:1700; Blood:25] Intake/Output this shift:    PE: Gen:  Alert, NAD, pleasant Abd: Soft, NT/ND, +BS, no HSM, incisions C/D/I with dermabond in place   Lab Results:   Recent Labs  10/30/14 1618 10/31/14 1300  WBC 6.8 5.6  HGB 13.1 12.3  HCT 38.2 36.9  PLT 241.0 208   BMET  Recent Labs  10/30/14 1618 10/31/14 1300  NA 137 139  K 3.2* 3.8  CL 100 104  CO2 30 25  GLUCOSE 138* 99  BUN 13 8  CREATININE 0.74 0.63  CALCIUM 9.4 8.4   PT/INR No results for input(s): LABPROT, INR in the last 72 hours. CMP     Component Value Date/Time   NA 139 10/31/2014 1300   K 3.8 10/31/2014 1300   CL 104 10/31/2014 1300   CO2 25 10/31/2014 1300   GLUCOSE 99 10/31/2014 1300   BUN 8 10/31/2014 1300   CREATININE 0.63 10/31/2014 1300   CREATININE 0.73 05/12/2014 0919   CALCIUM 8.4 10/31/2014 1300   PROT 6.7 05/12/2014 0919   ALBUMIN 4.4 05/12/2014 0919   AST 15 05/12/2014 0919   ALT 14 05/12/2014 0919   ALKPHOS 69 05/12/2014 0919   BILITOT 0.4 05/12/2014 0919   GFRNONAA >90 10/31/2014 1300   GFRNONAA 89 05/12/2014 0919   GFRAA >90 10/31/2014 1300   GFRAA >89 05/12/2014 0919   Lipase  No results found for: LIPASE     Studies/Results: Ct Abdomen Pelvis W Contrast  10/31/2014   CLINICAL DATA:   Persistent LEFT lower quadrant pain, tenderness and constipation for 6 days, family history of colon cancer, evaluate for diverticulitis, past history of smoking and hyperlipidemia  EXAM: CT ABDOMEN AND PELVIS WITH CONTRAST  TECHNIQUE: Multidetector CT imaging of the abdomen and pelvis was performed using the standard protocol following bolus administration of intravenous contrast. Sagittal and coronal MPR images reconstructed from axial data set.  CONTRAST:  152mL OMNIPAQUE IOHEXOL 300 MG/ML SOLN IV. Dilute oral contrast.  COMPARISON:  None.  FINDINGS: Lung bases clear.  Cyst superiorly and posteriorly in RIGHT lobe liver 2.6 x 1.9 cm image 9.  14 x 9 mm nodule LEFT adrenal gland demonstrating marked washout on delayed images compatible with adrenal adenoma.  Liver, spleen, pancreas, kidneys, and adrenal glands otherwise normal.  Unremarkable bladder and LEFT ureter.  RIGHT ureter appears mildly dilated extending to an area of inflammatory changes in the RIGHT pelvis, with wall thickening of segments of the distal sigmoid colon and in adjacent small bowel loop.  Proximal appendix is normal in caliber with distal appendix enlarged and extending into the area of inflammatory changes in the RIGHT pelvis.  Findings are suspicious for distal appendicitis.  No definite evidence of abscess or perforation though with the  degree of inflammatory changes present not excluded.  Remaining bowel loops unremarkable.  No mass, adenopathy, free air, or hernia.  Bones demineralized with degenerative disc disease changes at L5-S1 and scattered endplate concavities and lumbar spine.  IMPRESSION: Enlargement and wall thickening of the distal appendix with surrounding inflammatory changes in the RIGHT pelvis high suspicious for distal appendicitis.  No definite evidence of abscess or perforation though not excluded due to degree of inflammation identified.  Associated wall thickening of a small bowel loop and distal sigmoid colon as  well as mild dilatation of proximal to mid RIGHT ureter, all likely related to inflammatory changes in RIGHT pelvis.  Hepatic cyst.  LEFT adrenal adenoma.  Findings called to [facility] at [place] on 10/31/2014 at 0950 hours.   Electronically Signed   By: Lavonia Dana M.D.   On: 10/31/2014 09:54   Dg Chest Port 1 View  10/31/2014   CLINICAL DATA:  Preoperative appendectomy  EXAM: PORTABLE CHEST - 1 VIEW  COMPARISON:  June 16, 2013  FINDINGS: Lungs are clear. Heart size and pulmonary vascularity are normal. No adenopathy. There is evidence of old trauma involving the lateral right clavicle.  IMPRESSION: No edema or consolidation.   Electronically Signed   By: Lowella Grip III M.D.   On: 10/31/2014 13:56    Anti-infectives: Anti-infectives    Start     Dose/Rate Route Frequency Ordered Stop   10/31/14 1300  cefTRIAXone (ROCEPHIN) 2 g in dextrose 5 % 50 mL IVPB     2 g 100 mL/hr over 30 Minutes Intravenous Every 24 hours 10/31/14 1253     10/31/14 1300  metroNIDAZOLE (FLAGYL) IVPB 500 mg     500 mg 100 mL/hr over 60 Minutes Intravenous Every 8 hours 10/31/14 1253         Assessment/Plan Acute suppurative appendicitis with focal perforation POD #1 s/p lap appy -CBC now -IVF, pain control, antiemetics, IV antibiotics (rocephin/flagyl Day #2) -Ambulate and IS -SCD's and lovenox -Tolerating clears, advance diet -Plan on d/c tomorrow     LOS: 1 day    DORT, MEGAN 11/01/2014, 9:55 AM Pager: [phone]  Agree with above. More sore LLQ.  She has not walked much yet.  To keep until tomorrow.  Alphonsa Overall, MD, [facility] Pager: [phone] Office phone:  [phone]

## 2014-11-02 MED ORDER — AMOXICILLIN-POT CLAVULANATE 875-125 MG PO TABS
1.0000 | ORAL_TABLET | Freq: Two times a day (BID) | ORAL | Status: DC
  Administered 2014-11-02 – 2014-11-03 (×3): 1 via ORAL
  Filled 2014-11-02 (×3): qty 1

## 2014-11-02 MED ORDER — BISACODYL 10 MG RE SUPP
10.0000 mg | Freq: Once | RECTAL | Status: AC
Start: 2014-11-02 — End: 2014-11-02
  Administered 2014-11-02: 10 mg via RECTAL
  Filled 2014-11-02: qty 1

## 2014-11-02 NOTE — Progress Notes (Signed)
Patient ID: Karen Barrera, female   DOB: 04/02/1951, 64 y.o.   MRN: [id number] 2 Days Post-Op  Subjective: Pt feels well today and wants to go home, but has not passed any flatus or had a BM.  Tolerating her diet, but has a decreased appetite as expected.  Belching.  Objective: Vital signs in last 24 hours: Temp:  [97.5 F (36.4 C)-98.6 F (37 C)] 97.5 F (36.4 C) (04/07 0953) Pulse Rate:  [62-90] 62 (04/07 0953) Resp:  [16-20] 16 (04/07 0953) BP: (93-123)/(44-62) 97/55 mmHg (04/07 0953) SpO2:  [96 %-100 %] 96 % (04/07 0953) Last BM Date: 10/31/14  Intake/Output from previous day: 04/06 0701 - 04/07 0700 In: 720 [P.O.:720] Out: 1700 [Urine:1700] Intake/Output this shift:    PE: Abd: soft, some bloating, hypoactive BS, appropriately tender, all 4 incisions are c/d/i with dermabond  Lab Results:   Recent Labs  10/31/14 1300 11/01/14 1035  WBC 5.6 6.9  HGB 12.3 11.5*  HCT 36.9 34.4*  PLT 208 212   BMET  Recent Labs  10/30/14 1618 10/31/14 1300  NA 137 139  K 3.2* 3.8  CL 100 104  CO2 30 25  GLUCOSE 138* 99  BUN 13 8  CREATININE 0.74 0.63  CALCIUM 9.4 8.4   PT/INR No results for input(s): LABPROT, INR in the last 72 hours. CMP     Component Value Date/Time   NA 139 10/31/2014 1300   K 3.8 10/31/2014 1300   CL 104 10/31/2014 1300   CO2 25 10/31/2014 1300   GLUCOSE 99 10/31/2014 1300   BUN 8 10/31/2014 1300   CREATININE 0.63 10/31/2014 1300   CREATININE 0.73 05/12/2014 0919   CALCIUM 8.4 10/31/2014 1300   PROT 6.7 05/12/2014 0919   ALBUMIN 4.4 05/12/2014 0919   AST 15 05/12/2014 0919   ALT 14 05/12/2014 0919   ALKPHOS 69 05/12/2014 0919   BILITOT 0.4 05/12/2014 0919   GFRNONAA >90 10/31/2014 1300   GFRNONAA 89 05/12/2014 0919   GFRAA >90 10/31/2014 1300   GFRAA >89 05/12/2014 0919   Lipase  No results found for: LIPASE     Studies/Results: Dg Chest Port 1 View  10/31/2014   CLINICAL DATA:  Preoperative appendectomy  EXAM: PORTABLE CHEST -  1 VIEW  COMPARISON:  June 16, 2013  FINDINGS: Lungs are clear. Heart size and pulmonary vascularity are normal. No adenopathy. There is evidence of old trauma involving the lateral right clavicle.  IMPRESSION: No edema or consolidation.   Electronically Signed   By: Lowella Grip III M.D.   On: 10/31/2014 13:56    Anti-infectives: Anti-infectives    Start     Dose/Rate Route Frequency Ordered Stop   10/31/14 1300  cefTRIAXone (ROCEPHIN) 2 g in dextrose 5 % 50 mL IVPB     2 g 100 mL/hr over 30 Minutes Intravenous Every 24 hours 10/31/14 1253     10/31/14 1300  metroNIDAZOLE (FLAGYL) IVPB 500 mg     500 mg 100 mL/hr over 60 Minutes Intravenous Every 8 hours 10/31/14 1253         Assessment/Plan   Acute suppurative appendicitis with focal perforation POD #2 s/p lap appy  -CBC now  (WBC normal)  -IVF, pain control, antiemetics, IV antibiotics (rocephin/flagyl Day #3/7), will switch to oral augmentin -Ambulate and IS -SCD's and lovenox -cont regular diet, but has not had any bowel function.  Will give a dulcolax supp today.  If she has bowel function, will plan for home tomorrow.  LOS: 2 days    OSBORNE,KELLY E 11/02/2014, 11:49 AM Pager: [phone]  Agree with above. WBC - 6,900 - 11/02/2014 Had small BM, though she is still somewhat distended. Husband in room.  Will keep until tomorrow.  Alphonsa Overall, MD, [facility] Pager: [phone] Office phone:  [phone]

## 2014-11-03 MED ORDER — OXYCODONE-ACETAMINOPHEN 5-325 MG PO TABS: 1.0000 | ORAL_TABLET | Freq: Three times a day (TID) | ORAL | Status: DC | PRN

## 2014-11-03 MED ORDER — IBUPROFEN 600 MG PO TABS: 600.0000 mg | ORAL_TABLET | Freq: Three times a day (TID) | ORAL | Status: DC | PRN

## 2014-11-03 MED ORDER — AMOXICILLIN-POT CLAVULANATE 875-125 MG PO TABS: 1.0000 | ORAL_TABLET | Freq: Two times a day (BID) | ORAL | Status: DC

## 2014-11-03 NOTE — Discharge Summary (Signed)
Physician Discharge Summary  Patient ID: Karen Barrera MRN: [id number] DOB/AGE: 1951-01-18 64 y.o.  Admit date: 10/31/2014 Discharge date: 11/03/2014  Admitting Diagnosis: Acute appendicitis   Discharge Diagnosis Patient Active Problem List   Diagnosis Date Noted  . Acute appendicitis 10/31/2014  . Acute perforated appendicitis 10/31/2014  . Dyspnea 10/31/2013  . Depression   . Hyperlipidemia   . Anemia   . Pre-diabetes   . Vitamin D deficiency   . Tobacco dependence   . DDD (degenerative disc disease)   . Acute bronchitis 07/03/2013  . Seasonal and perennial allergic rhinitis 07/03/2013    Consultants none  Imaging: No results found.  Procedures Laparoscopic appendectomy---Dr. Lucia Gaskins 10/31/14  Hospital Course:  Karen Barrera presented to [facility] with acute appendicitis following an outpatient CT.   Patient was admitted and underwent procedure listed above.  Tolerated procedure well and was transferred to the floor.    She was continued on iv antibiotics for suppurative appendicitis with focal tenderness.  Diet was advanced as tolerated.  On POD#2, the patient was voiding well, tolerating diet, ambulating well, pain well controlled, vital signs stable, incisions c/d/i and felt stable for discharge home with days of augmentin.  Medication risks, benefits and therapeutic alternatives were reviewed with the patient.  She verbalizes understanding.  Patient will follow up in our office in 3 weeks and knows to call with questions or concerns.  Physical Exam: General:  Alert, NAD, pleasant, comfortable Abd:  Soft, ND, mild tenderness, incisions C/D/I    Medication List    STOP taking these medications        amoxicillin-clavulanate 600-42.9 MG/5ML suspension  Commonly known as:  AUGMENTIN ES-600  Replaced by:  amoxicillin-clavulanate 875-125 MG per tablet      TAKE these medications        albuterol 108 (90 BASE) MCG/ACT inhaler  Commonly known as:  PROAIR HFA  INHALE  2 PUFFS INTO THE LUNGS EVERY 4 HOURS AS NEEDED FOR WHEEZING OR SHORTNESS OF BREATH.     amoxicillin-clavulanate 875-125 MG per tablet  Commonly known as:  AUGMENTIN  Take 1 tablet by mouth every 12 (twelve) hours.     aspirin 81 MG tablet  Take 81 mg by mouth daily.     buPROPion 150 MG 24 hr tablet  Commonly known as:  WELLBUTRIN XL  Take 1 tablet (150 mg total) by mouth 2 (two) times daily. Start by taking 1 tablet daily x 1 week. Then increase to twice daily.     CoQ10 200 MG Caps  Take 200 mg by mouth daily.     co-enzyme Q-10 30 MG capsule  Take 30 mg by mouth daily. Takes 200 mg daily     CRESTOR 10 MG tablet  Generic drug:  rosuvastatin  TAKE 1 TABLET BY MOUTH EVERY DAY     diphenhydrAMINE 25 MG tablet  Commonly known as:  BENADRYL  Take 25 mg by mouth every 6 (six) hours as needed.     EPINEPHrine 0.3 mg/0.3 mL Soaj injection  Commonly known as:  EPI-PEN  Inject 0.3 mLs (0.3 mg total) into the muscle once.     ibuprofen 600 MG tablet  Commonly known as:  ADVIL,MOTRIN  Take 1 tablet (600 mg total) by mouth every 8 (eight) hours as needed (mild pain).     Magnesium 300 MG Caps  Take by mouth daily.     Melatonin 5 MG Caps  Take 2 capsules by mouth at bedtime.     montelukast  10 MG tablet  Commonly known as:  SINGULAIR  Take 1 tablet (10 mg total) by mouth at bedtime.     oxyCODONE-acetaminophen 5-325 MG per tablet  Commonly known as:  PERCOCET/ROXICET  Take 1-2 tablets by mouth every 8 (eight) hours as needed for moderate pain.     PROBIOTIC DAILY PO  Take 1 tablet by mouth daily.     SUPER B COMPLEX PO  Take by mouth daily.     Vitamin D-3 5000 UNITS Tabs  Take by mouth daily.             Follow-up Information    Follow up with [facility] On 11/21/2014.   Specialty:  General Surgery   Why:  [facility], 3:30pm, arrive no later than 3:00pm for paperwork   Contact information:   [address] [place]  [address] [phone]       Signed: Erby Pian, [facility] [phone]  11/03/2014, 11:43 AM  Agree with above.  Alphonsa Overall, MD, [facility] Pager: [phone] Office phone:  [phone]

## 2014-11-03 NOTE — Progress Notes (Signed)
Patient discharged home in stable condition. Verbalizes understanding of all discharge instructions, including home medications and follow up appointments. 

## 2014-11-03 NOTE — Discharge Instructions (Signed)
[facility] ______[facility] °LAPAROSCOPIC SURGERY: POST OP INSTRUCTIONS °Always review your discharge instruction sheet given to you by the facility where your surgery was performed. °IF YOU HAVE DISABILITY OR FAMILY LEAVE FORMS, YOU MUST BRING THEM TO THE OFFICE FOR PROCESSING.   °DO NOT GIVE THEM TO YOUR DOCTOR. ° °1. A prescription for pain medication may be given to you upon discharge.  Take your pain medication as prescribed, if needed.  If narcotic pain medicine is not needed, then you may take acetaminophen (Tylenol) or ibuprofen (Advil) as needed. °2. Take your usually prescribed medications unless otherwise directed. °3. If you need a refill on your pain medication, please contact your pharmacy.  They will contact our office to request authorization. Prescriptions will not be filled after 5pm or on week-ends. °4. You should follow a light diet the first few days after arrival home, such as soup and crackers, etc.  Be sure to include lots of fluids daily. °5. Most patients will experience some swelling and bruising in the area of the incisions.  Ice packs will help.  Swelling and bruising can take several days to resolve.  °6. It is common to experience some constipation if taking pain medication after surgery.  Increasing fluid intake and taking a stool softener (such as Colace) will usually help or prevent this problem from occurring.  A mild laxative (Milk of Magnesia or Miralax) should be taken according to package instructions if there are no bowel movements after 48 hours. °7. Unless discharge instructions indicate otherwise, you may remove your bandages 24-48 hours after surgery, and you may shower at that time.  You may have steri-strips (small skin tapes) in place directly over the incision.  These strips should be left on the skin for 7-10 days.  If your surgeon used skin glue on the incision, you may shower in 24 hours.  The glue will flake off over the next 2-3 weeks.  Any sutures or  staples will be removed at the office during your follow-up visit. °8. ACTIVITIES:  You may resume regular (light) daily activities beginning the next day--such as daily self-care, walking, climbing stairs--gradually increasing activities as tolerated.  You may have sexual intercourse when it is comfortable.  Refrain from any heavy lifting or straining until approved by your doctor. °a. You may drive when you are no longer taking prescription pain medication, you can comfortably wear a seatbelt, and you can safely maneuver your car and apply brakes. °b. RETURN TO WORK:  __________________________________________________________ °9. You should see your doctor in the office for a follow-up appointment approximately 2-3 weeks after your surgery.  Make sure that you call for this appointment within a day or two after you arrive home to insure a convenient appointment time. °10. OTHER INSTRUCTIONS: __________________________________________________________________________________________________________________________ __________________________________________________________________________________________________________________________ °WHEN TO CALL YOUR DOCTOR: °1. Fever over 101.0 °2. Inability to urinate °3. Continued bleeding from incision. °4. Increased pain, redness, or drainage from the incision. °5. Increasing abdominal pain ° °The clinic staff is available to answer your questions during regular business hours.  Please don’t hesitate to call and ask to speak to one of the nurses for clinical concerns.  If you have a medical emergency, go to the nearest emergency room or call 911.  A surgeon from [facility] is always on call at the hospital. °[address], [address], [place], [place]  [address] ? [address] [address], [place], [place]   [address] °[phone] ? [phone] ? FAX [phone] °Web site:   [email or web address] °

## 2014-11-21 ENCOUNTER — Ambulatory Visit (INDEPENDENT_AMBULATORY_CARE_PROVIDER_SITE_OTHER): Payer: 59 | Admitting: Physician Assistant

## 2014-11-21 ENCOUNTER — Encounter: Payer: Self-pay | Admitting: Physician Assistant

## 2014-11-21 VITALS — BP 120/78 | HR 80 | Temp 97.7°F | Resp 16 | Ht 64.75 in | Wt 160.0 lb

## 2014-11-21 DIAGNOSIS — E785 Hyperlipidemia, unspecified: Secondary | ICD-10-CM

## 2014-11-21 DIAGNOSIS — F17200 Nicotine dependence, unspecified, uncomplicated: Secondary | ICD-10-CM

## 2014-11-21 DIAGNOSIS — Z79899 Other long term (current) drug therapy: Secondary | ICD-10-CM | POA: Insufficient documentation

## 2014-11-21 DIAGNOSIS — E559 Vitamin D deficiency, unspecified: Secondary | ICD-10-CM

## 2014-11-21 DIAGNOSIS — R7303 Prediabetes: Secondary | ICD-10-CM

## 2014-11-21 DIAGNOSIS — R7309 Other abnormal glucose: Secondary | ICD-10-CM

## 2014-11-21 LAB — CBC WITH DIFFERENTIAL/PLATELET
Basophils Absolute: 0.1 10*3/uL (ref 0.0–0.1)
Basophils Relative: 1 % (ref 0–1)
Eosinophils Absolute: 0.1 10*3/uL (ref 0.0–0.7)
Eosinophils Relative: 2 % (ref 0–5)
HCT: 42.4 % (ref 36.0–46.0)
Hemoglobin: 14.3 g/dL (ref 12.0–15.0)
Lymphocytes Relative: 25 % (ref 12–46)
Lymphs Abs: 1.7 10*3/uL (ref 0.7–4.0)
MCH: 30.2 pg (ref 26.0–34.0)
MCHC: 33.7 g/dL (ref 30.0–36.0)
MCV: 89.5 fL (ref 78.0–100.0)
MPV: 10.1 fL (ref 8.6–12.4)
Monocytes Absolute: 0.5 10*3/uL (ref 0.1–1.0)
Monocytes Relative: 8 % (ref 3–12)
Neutro Abs: 4.2 10*3/uL (ref 1.7–7.7)
Neutrophils Relative %: 64 % (ref 43–77)
Platelets: 236 10*3/uL (ref 150–400)
RBC: 4.74 MIL/uL (ref 3.87–5.11)
RDW: 14.5 % (ref 11.5–15.5)
WBC: 6.6 10*3/uL (ref 4.0–10.5)

## 2014-11-21 LAB — HEMOGLOBIN A1C
Hgb A1c MFr Bld: 6.2 % — ABNORMAL HIGH (ref <5.7)
Mean Plasma Glucose: 131 mg/dL — ABNORMAL HIGH (ref <117)

## 2014-11-21 MED ORDER — ALBUTEROL SULFATE HFA 108 (90 BASE) MCG/ACT IN AERS: 2.0000 | INHALATION_SPRAY | Freq: Four times a day (QID) | RESPIRATORY_TRACT | Status: DC | PRN

## 2014-11-21 MED ORDER — ROSUVASTATIN CALCIUM 10 MG PO TABS: 10.0000 mg | ORAL_TABLET | Freq: Every day | ORAL | Status: DC

## 2014-11-21 MED ORDER — MONTELUKAST SODIUM 10 MG PO TABS: 10.0000 mg | ORAL_TABLET | Freq: Every day | ORAL | Status: DC

## 2014-11-21 NOTE — Patient Instructions (Addendum)
Before you even begin to attack a weight-loss plan, it pays to remember this: You are not fat. You have fat. Losing weight isn't about blame or shame; it's simply another achievement to accomplish. Dieting is like any other skill-you have to buckle down and work at it. As long as you act in a smart, reasonable way, you'll ultimately get where you want to be. Here are some weight loss pearls for you.  1. It's Not a Diet. It's a Lifestyle Thinking of a diet as something you're on and suffering through only for the short term doesn't work. To shed weight and keep it off, you need to make permanent changes to the way you eat. It's OK to indulge occasionally, of course, but if you cut calories temporarily and then revert to your old way of eating, you'll gain back the weight quicker than you can say yo-yo. Use it to lose it. Research shows that one of the best predictors of long-term weight loss is how many pounds you drop in the first month. For that reason, nutritionists often suggest being stricter for the first two weeks of your new eating strategy to build momentum. Cut out added sugar and alcohol and avoid unrefined carbs. After that, figure out how you can reincorporate them in a way that's healthy and maintainable.  2. There's a Right Way to Exercise Working out burns calories and fat and boosts your metabolism by building muscle. But those trying to lose weight are notorious for overestimating the number of calories they burn and underestimating the amount they take in. Unfortunately, your system is biologically programmed to hold on to extra pounds and that means when you start exercising, your body senses the deficit and ramps up its hunger signals. If you're not diligent, you'll eat everything you burn and then some. Use it to lose it. Cardio gets all the exercise glory, but strength and interval training are the real heroes. They help you build lean muscle, which in turn increases your metabolism and  calorie-burning ability 3. Don't Overreact to Mild Hunger Some people have a hard time losing weight because of hunger anxiety. To them, being hungry is bad-something to be avoided at all costs-so they carry snacks with them and eat when they don't need to. Others eat because they're stressed out or bored. While you never want to get to the point of being ravenous (that's when bingeing is likely to happen), a hunger pang, a craving, or the fact that it's 3:00 p.m. should not send you racing for the vending machine or obsessing about the energy bar in your purse. Ideally, you should put off eating until your stomach is growling and it's difficult to concentrate.  Use it to lose it. When you feel the urge to eat, use the HALT method. Ask yourself, Am I really hungry? Or am I angry or anxious, lonely or bored, or tired? If you're still not certain, try the apple test. If you're truly hungry, an apple should seem delicious; if it doesn't, something else is going on. Or you can try drinking water and making yourself busy, if you are still hungry try a healthy snack.  4. Not All Calories Are Created Equal The mechanics of weight loss are pretty simple: Take in fewer calories than you use for energy. But the kind of food you eat makes all the difference. Processed food that's high in saturated fat and refined starch or sugar can cause inflammation that disrupts the hormone signals that tell  your brain you're full. The result: You eat a lot more.  Use it to lose it. Clean up your diet. Swap in whole, unprocessed foods, including vegetables, lean protein, and healthy fats that will fill you up and give you the biggest nutritional bang for your calorie buck. In a few weeks, as your brain starts receiving regular hunger and fullness signals once again, you'll notice that you feel less hungry overall and naturally start cutting back on the amount you eat.  5. Protein, Produce, and Plant-Based Fats Are Your Weight-Loss  [place] Here's why eating the three Ps regularly will help you drop pounds. Protein fills you up. You need it to build lean muscle, which keeps your metabolism humming so that you can torch more fat. People in a weight-loss program who ate double the recommended daily allowance for protein (about 110 grams for a 150-pound woman) lost 70 percent of their weight from fat, while people who ate the RDA lost only about 40 percent, one study found. Produce is packed with filling fiber. "It's very difficult to consume too many calories if you're eating a lot of vegetables. Example: Three cups of broccoli is a lot of food, yet only 93 calories. (Fruit is another story. It can be easy to overeat and can contain a lot of calories from sugar, so be sure to monitor your intake.) Plant-based fats like olive oil and those in avocados and nuts are healthy and extra satiating.  Use it to lose it. Aim to incorporate each of the three Ps into every meal and snack. People who eat protein throughout the day are able to keep weight off, according to a study in the American Journal of Clinical Nutrition. In addition to meat, poultry and seafood, good sources are beans, lentils, eggs, tofu, and yogurt. As for fat, keep portion sizes in check by measuring out salad dressing, oil, and nut butters (shoot for one to two tablespoons). Finally, eat veggies or a little fruit at every meal. People who did that consumed 308 fewer calories but didn't feel any hungrier than when they didn't eat more produce.  7. How You Eat Is As Important As What You Eat In order for your brain to register that you're full, you need to focus on what you're eating. Sit down whenever you eat, preferably at a table. Turn off the TV or computer, put down your phone, and look at your food. Smell it. Chew slowly, and don't put another bite on your fork until you swallow. When women ate lunch this attentively, they consumed 30 percent less when snacking later than  those who listened to an audiobook at lunchtime, according to a study in the British Journal of Nutrition. 8. Weighing Yourself Really Works The scale provides the best evidence about whether your efforts are paying off. Seeing the numbers tick up or down or stagnate is motivation to keep going-or to rethink your approach. A 2015 study at [employer] found that daily weigh-ins helped people lose more weight, keep it off, and maintain that loss, even after two years. Use it to lose it. Step on the scale at the same time every day for the best results. If your weight shoots up several pounds from one weigh-in to the next, don't freak out. Eating a lot of salt the night before or having your period is the likely culprit. The number should return to normal in a day or two. It's a steady climb that you need to do something about.   9. Too Much Stress and Too Little Sleep Are Your Enemies When you're tired and frazzled, your body cranks up the production of cortisol, the stress hormone that can cause carb cravings. Not getting enough sleep also boosts your levels of ghrelin, a hormone associated with hunger, while suppressing leptin, a hormone that signals fullness and satiety. People on a diet who slept only five and a half hours a night for two weeks lost 55 percent less fat and were hungrier than those who slept eight and a half hours, according to a study in the [place]. Use it to lose it. Prioritize sleep, aiming for seven hours or more a night, which research shows helps lower stress. And make sure you're getting quality zzz's. If a snoring spouse or a fidgety cat wakes you up frequently throughout the night, you may end up getting the equivalent of just four hours of sleep, according to a study from [facility]. Keep pets out of the bedroom, and use a white-noise app to drown out snoring. 10. You Will Hit a plateau-And You Can Bust Through It As you slim down, your  body releases much less leptin, the fullness hormone.  If you're not strength training, start right now. Building muscle can raise your metabolism to help you overcome a plateau. To keep your body challenged and burning calories, incorporate new moves and more intense intervals into your workouts or add another sweat session to your weekly routine. Alternatively, cut an extra 100 calories or so a day from your diet. Now that you've lost weight, your body simply doesn't need as much fuel.   Ways to cut 100 calories  1. Eat your eggs with hot sauce OR salsa instead of cheese.  Eggs are great for breakfast, but many people consider eggs and cheese to be BFFs. Instead of cheese-1 oz. of cheddar has 114 calories-top your eggs with hot sauce, which contains no calories and helps with satiety and metabolism. Salsa is also a great option!!  2. Top your toast, waffles or pancakes with mashed berries instead of jelly or syrup. Half a cup of berries-fresh, frozen or thawed-has about 40 calories, compared with 2 tbsp. of maple syrup or jelly, which both have about 100 calories. The berries will also give you a good punch of fiber, which helps keep you full and satisfied and won't spike blood sugar quickly like the jelly or syrup. 3. Swap the non-fat latte for black coffee with a splash of half-and-half. Contrary to its name, that non-fat latte has 130 calories and a startling 19g of carbohydrates per 16 oz. serving. Replacing that 'light' drinkable dessert with a black coffee with a splash of half-and-half saves you more than 100 calories per 16 oz. serving. 4. Sprinkle salads with freeze-dried raspberries instead of dried cranberries. If you want a sweet addition to your nutritious salad, stay away from dried cranberries. They have a whopping 130 calories per  cup and 30g carbohydrates. Instead, sprinkle freeze-dried raspberries guilt-free and save more than 100 calories per  cup serving, adding 3g of belly-filling  fiber. 5. Go for mustard in place of mayo on your sandwich. Mustard can add really nice flavor to any sandwich, and there are tons of varieties, from spicy to honey. A serving of mayo is 95 calories, versus 10 calories in a serving of mustard. 6. Choose a DIY salad dressing instead of the store-bought kind. Mix Dijon or whole grain mustard with low-fat Kefir or red wine vinegar  and garlic. 7. Use hummus as a spread instead of a dip. Use hummus as a spread on a high-fiber cracker or tortilla with a sandwich and save on calories without sacrificing taste. 8. Pick just one salad "accessory." Salad isn't automatically a calorie winner. It's easy to over-accessorize with toppings. Instead of topping your salad with nuts, avocado and cranberries (all three will clock in at 313 calories), just pick one. The next day, choose a different accessory, which will also keep your salad interesting. You don't wear all your jewelry every day, right? 9. Ditch the white pasta in favor of spaghetti squash. One cup of cooked spaghetti squash has about 40 calories, compared with traditional spaghetti, which comes with more than 200. Spaghetti squash is also nutrient-dense. It's a good source of fiber and Vitamins A and C, and it can be eaten just like you would eat pasta-with a great tomato sauce and [place] meatballs or with pesto, tofu and spinach, for example. 10. Dress up your chili, soups and stews with non-fat [place] yogurt instead of sour cream. Just a 'dollop' of sour cream can set you back 115 calories and a whopping 12g of fat-seven of which are of the artery-clogging variety. Added bonus: [place] yogurt is packed with muscle-building protein, calcium and B Vitamins. 11. Mash cauliflower instead of mashed potatoes. One cup of traditional mashed potatoes-in all their creamy goodness-has more than 200 calories, compared to mashed cauliflower, which you can typically eat for less than 100 calories per 1 cup serving.  Cauliflower is a great source of the antioxidant indole-3-carbinol (I3C), which may help reduce the risk of some cancers, like breast cancer. 12. Ditch the ice cream sundae in favor of a [place] yogurt parfait. Instead of a cup of ice cream or fro-yo for dessert, try 1 cup of nonfat Greek yogurt topped with fresh berries and a sprinkle of cacao nibs. Both toppings are packed with antioxidants, which can help reduce cellular inflammation and oxidative damage. And the comparison is a no-brainer: One cup of ice cream has about 275 calories; one cup of frozen yogurt has about 230; and a cup of Greek yogurt has just 130, plus twice the protein, so you're less likely to return to the freezer for a second helping. 13. Put olive oil in a spray container instead of using it directly from the bottle. Each tablespoon of olive oil is 120 calories and 15g of fat. Use a mister instead of pouring it straight into the pan or onto a salad. This allows for portion control and will save you more than 100 calories. 14. When baking, substitute canned pumpkin for butter or oil. Canned pumpkin-not pumpkin pie mix-is loaded with Vitamin A, which is important for skin and eye health, as well as immunity. And the comparisons are pretty crazy:  cup of canned pumpkin has about 40 calories, compared to butter or oil, which has more than 800 calories. Yes, 800 calories. Applesauce and mashed banana can also serve as good substitutions for butter or oil, usually in a 1:1 ratio. 15. Top casseroles with high-fiber cereal instead of breadcrumbs. Breadcrumbs are typically made with white bread, while breakfast cereals contain 5-9g of fiber per serving. Not only will you save more than 150 calories per  cup serving, the swap will also keep you more full and you'll get a metabolism boost from the added fiber. 16. Snack on pistachios instead of macadamia nuts. Believe it or not, you get the same amount of calories from 35  pistachios (100  calories) as you would from only five macadamia nuts. 17. Chow down on kale chips rather than potato chips. This is my favorite 'don't knock it 'till you try it' swap. Kale chips are so easy to make at home, and you can spice them up with a little grated parmesan or chili powder. Plus, they're a mere fraction of the calories of potato chips, but with the same crunch factor we crave so often. 18. Add seltzer and some fruit slices to your cocktail instead of soda or fruit juice. One cup of soda or fruit juice can pack on as much as 140 calories. Instead, use seltzer and fruit slices. The fruit provides valuable phytochemicals, such as flavonoids and anthocyanins, which help to combat cancer and stave off the aging process.  .Smoking Cessation, Tips for Success If you are ready to quit smoking, congratulations! You have chosen to help yourself be healthier. Cigarettes bring nicotine, tar, carbon monoxide, and other irritants into your body. Your lungs, heart, and blood vessels will be able to work better without these poisons. There are many different ways to quit smoking. Nicotine gum, nicotine patches, a nicotine inhaler, or nicotine nasal spray can help with physical craving. Hypnosis, support groups, and medicines help break the habit of smoking. WHAT THINGS CAN I DO TO MAKE QUITTING EASIER?  Here are some tips to help you quit for good:  Pick a date when you will quit smoking completely. Tell all of your friends and family about your plan to quit on that date.  Do not try to slowly cut down on the number of cigarettes you are smoking. Pick a quit date and quit smoking completely starting on that day.  Throw away all cigarettes.   Clean and remove all ashtrays from your home, work, and car.  On a card, write down your reasons for quitting. Carry the card with you and read it when you get the urge to smoke.  Cleanse your body of nicotine. Drink enough water and fluids to keep your urine clear  or pale yellow. Do this after quitting to flush the nicotine from your body.  Learn to predict your moods. Do not let a bad situation be your excuse to have a cigarette. Some situations in your life might tempt you into wanting a cigarette.  Never have "just one" cigarette. It leads to wanting another and another. Remind yourself of your decision to quit.  Change habits associated with smoking. If you smoked while driving or when feeling stressed, try other activities to replace smoking. Stand up when drinking your coffee. Brush your teeth after eating. Sit in a different chair when you read the paper. Avoid alcohol while trying to quit, and try to drink fewer caffeinated beverages. Alcohol and caffeine may urge you to smoke.  Avoid foods and drinks that can trigger a desire to smoke, such as sugary or spicy foods and alcohol.  Ask people who smoke not to smoke around you.  Have something planned to do right after eating or having a cup of coffee. For example, plan to take a walk or exercise.  Try a relaxation exercise to calm you down and decrease your stress. Remember, you may be tense and nervous for the first 2 weeks after you quit, but this will pass.  Find new activities to keep your hands busy. Play with a pen, coin, or rubber band. Doodle or draw things on paper.  Brush your teeth right after eating. This will help  cut down on the craving for the taste of tobacco after meals. You can also try mouthwash.   Use oral substitutes in place of cigarettes. Try using lemon drops, carrots, cinnamon sticks, or chewing gum. Keep them handy so they are available when you have the urge to smoke.  When you have the urge to smoke, try deep breathing.  Designate your home as a nonsmoking area.  If you are a heavy smoker, ask your health care provider about a prescription for nicotine chewing gum. It can ease your withdrawal from nicotine.  Reward yourself. Set aside the cigarette money you save  and buy yourself something nice.  Look for support from others. Join a support group or smoking cessation program. Ask someone at home or at work to help you with your plan to quit smoking.  Always ask yourself, "Do I need this cigarette or is this just a reflex?" Tell yourself, "Today, I choose not to smoke," or "I do not want to smoke." You are reminding yourself of your decision to quit.  Do not replace cigarette smoking with electronic cigarettes (commonly called e-cigarettes). The safety of e-cigarettes is unknown, and some may contain harmful chemicals.  If you relapse, do not give up! Plan ahead and think about what you will do the next time you get the urge to smoke. HOW WILL I FEEL WHEN I QUIT SMOKING? You may have symptoms of withdrawal because your body is used to nicotine (the addictive substance in cigarettes). You may crave cigarettes, be irritable, feel very hungry, cough often, get headaches, or have difficulty concentrating. The withdrawal symptoms are only temporary. They are strongest when you first quit but will go away within 10-14 days. When withdrawal symptoms occur, stay in control. Think about your reasons for quitting. Remind yourself that these are signs that your body is healing and getting used to being without cigarettes. Remember that withdrawal symptoms are easier to treat than the major diseases that smoking can cause.  Even after the withdrawal is over, expect periodic urges to smoke. However, these cravings are generally short lived and will go away whether you smoke or not. Do not smoke! WHAT RESOURCES ARE AVAILABLE TO HELP ME QUIT SMOKING? Your health care provider can direct you to community resources or hospitals for support, which may include:  Group support.  Education.  Hypnosis.  Therapy. Document Released: 04/11/2004 Document Revised: 11/28/2013 Document Reviewed: 12/30/2012 [facility] Patient Information 2015 [place], [place]. This information is not  intended to replace advice given to you by your health care provider. Make sure you discuss any questions you have with your health care provider.  Can try melatonin '5mg'$ -15 mg at night for sleep, can also do benadryl 25-'50mg'$  at night for sleep.  If this does not help we can try prescription medication.  Also here is some information about good sleep hygiene.   Insomnia Insomnia is frequent trouble falling and/or staying asleep. Insomnia can be a long term problem or a short term problem. Both are common. Insomnia can be a short term problem when the wakefulness is related to a certain stress or worry. Long term insomnia is often related to ongoing stress during waking hours and/or poor sleeping habits. Overtime, sleep deprivation itself can make the problem worse. Every little thing feels more severe because you are overtired and your ability to cope is decreased. CAUSES   Stress, anxiety, and depression.  Poor sleeping habits.  Distractions such as TV in the bedroom.  Naps close to  bedtime.  Engaging in emotionally charged conversations before bed.  Technical reading before sleep.  Alcohol and other sedatives. They may make the problem worse. They can hurt normal sleep patterns and normal dream activity.  Stimulants such as caffeine for several hours prior to bedtime.  Pain syndromes and shortness of breath can cause insomnia.  Exercise late at night.  Changing time zones may cause sleeping problems (jet lag). It is sometimes helpful to have someone observe your sleeping patterns. They should look for periods of not breathing during the night (sleep apnea). They should also look to see how long those periods last. If you live alone or observers are uncertain, you can also be observed at a sleep clinic where your sleep patterns will be professionally monitored. Sleep apnea requires a checkup and treatment. Give your caregivers your medical history. Give your caregivers observations  your family has made about your sleep.  SYMPTOMS   Not feeling rested in the morning.  Anxiety and restlessness at bedtime.  Difficulty falling and staying asleep. TREATMENT   Your caregiver may prescribe treatment for an underlying medical disorders. Your caregiver can give advice or help if you are using alcohol or other drugs for self-medication. Treatment of underlying problems will usually eliminate insomnia problems.  Medications can be prescribed for short time use. They are generally not recommended for lengthy use.  Over-the-counter sleep medicines are not recommended for lengthy use. They can be habit forming.  You can promote easier sleeping by making lifestyle changes such as:  Using relaxation techniques that help with breathing and reduce muscle tension.  Exercising earlier in the day.  Changing your diet and the time of your last meal. No night time snacks.  Establish a regular time to go to bed.  Counseling can help with stressful problems and worry.  Soothing music and white noise may be helpful if there are background noises you cannot remove.  Stop tedious detailed work at least one hour before bedtime. HOME CARE INSTRUCTIONS   Keep a diary. Inform your caregiver about your progress. This includes any medication side effects. See your caregiver regularly. Take note of:  Times when you are asleep.  Times when you are awake during the night.  The quality of your sleep.  How you feel the next day. This information will help your caregiver care for you.  Get out of bed if you are still awake after 15 minutes. Read or do some quiet activity. Keep the lights down. Wait until you feel sleepy and go back to bed.  Keep regular sleeping and waking hours. Avoid naps.  Exercise regularly.  Avoid distractions at bedtime. Distractions include watching television or engaging in any intense or detailed activity like attempting to balance the household  checkbook.  Develop a bedtime ritual. Keep a familiar routine of bathing, brushing your teeth, climbing into bed at the same time each night, listening to soothing music. Routines increase the success of falling to sleep faster.  Use relaxation techniques. This can be using breathing and muscle tension release routines. It can also include visualizing peaceful scenes. You can also help control troubling or intruding thoughts by keeping your mind occupied with boring or repetitive thoughts like the old concept of counting sheep. You can make it more creative like imagining planting one beautiful flower after another in your backyard garden.  During your day, work to eliminate stress. When this is not possible use some of the previous suggestions to help reduce the anxiety that  accompanies stressful situations. MAKE SURE YOU:   Understand these instructions.  Will watch your condition.  Will get help right away if you are not doing well or get worse. Document Released: 07/11/2000 Document Revised: 10/06/2011 Document Reviewed: 08/11/2007 [facility] Patient Information 2015 [place], [place]. This information is not intended to replace advice given to you by your health care provider. Make sure you discuss any questions you have with your health care provider. \

## 2014-11-21 NOTE — Progress Notes (Signed)
Assessment and Plan:  1. Hypertension -Continue medication, monitor blood pressure at home. Continue DASH diet.  Reminder to go to the ER if any CP, SOB, nausea, dizziness, severe HA, changes vision/speech, left arm numbness and tingling and jaw pain.  2. Cholesterol -Continue diet and exercise. Check cholesterol.   3. Prediabetes  -Continue diet and exercise. Check A1C  4. Vitamin D Def - check level and continue medications.   5. Smoking cessation-   instruction/counseling given, counseled patient on the dangers of tobacco use, advised patient to stop smoking, and reviewed strategies to maximize success, patient not ready to quit at this time.   6. Anxiety/ADD? Try the wellbutrin and will discucss next OV   Continue diet and meds as discussed. Further disposition pending results of labs. Over 30 minutes of exam, counseling, chart review, and critical decision making was performed  HPI 64 y.o. female  presents for 3 month follow up on hypertension, cholesterol, prediabetes, and vitamin D deficiency.   Her blood pressure has been controlled at home, today their BP is BP: 120/78 mmHg  She does not workout. She denies chest pain, shortness of breath, dizziness.  She is on cholesterol medication, crestor '10mg'$  1/2 QD and denies myalgias. Her cholesterol is at goal. The cholesterol last visit was:   Lab Results  Component Value Date   CHOL 151 05/12/2014   HDL 77 05/12/2014   LDLCALC 58 05/12/2014   TRIG 82 05/12/2014   CHOLHDL 2.0 05/12/2014   She has been working on diet and exercise for prediabetes, and denies paresthesia of the feet, polydipsia, polyuria and visual disturbances. Last A1C in the office was:  Lab Results  Component Value Date   HGBA1C 6.1* 05/12/2014  Patient is on Vitamin D supplement.   Lab Results  Component Value Date   VD25OH 65 05/12/2014  3 weeks s/p appendectomy for perforated appendix, she follows up with Dr. Lucia Gaskins today, had IV ABX and was  discharged on Augmentin but she states she did not have to take it. She has some pain occ with movement. Has urinary frequency but has been drinking a lot.  No diarrhea, constipation, nausea, fever, chills. She is on probiotic, has not needed the oxycodone.  Has some anxiety, trouble with memory. Is not on wellbutrin right now.  Still smoking, states some issues/stress with her family and not ready to quit.  Current Medications:  Current Outpatient Prescriptions on File Prior to Visit  Medication Sig Dispense Refill  . aspirin 81 MG tablet Take 81 mg by mouth daily.    . B Complex-C (SUPER B COMPLEX PO) Take by mouth daily.    Marland Kitchen buPROPion (WELLBUTRIN XL) 150 MG 24 hr tablet Take 1 tablet (150 mg total) by mouth 2 (two) times daily. Start by taking 1 tablet daily x 1 week. Then increase to twice daily. 60 tablet 2  . Cholecalciferol (VITAMIN D-3) 5000 UNITS TABS Take by mouth daily.    . Coenzyme Q10 (COQ10) 200 MG CAPS Take 200 mg by mouth daily.    . CRESTOR 10 MG tablet TAKE 1 TABLET BY MOUTH EVERY DAY 30 tablet 0  . diphenhydrAMINE (BENADRYL) 25 MG tablet Take 25 mg by mouth every 6 (six) hours as needed.    Marland Kitchen EPINEPHrine (EPI-PEN) 0.3 mg/0.3 mL SOAJ injection Inject 0.3 mLs (0.3 mg total) into the muscle once. 2 Device 2  . ibuprofen (ADVIL,MOTRIN) 600 MG tablet Take 1 tablet (600 mg total) by mouth every 8 (eight) hours as  needed (mild pain). 30 tablet 0  . Magnesium 300 MG CAPS Take by mouth daily.    . Melatonin 5 MG CAPS Take 2 capsules by mouth at bedtime.     . montelukast (SINGULAIR) 10 MG tablet Take 1 tablet (10 mg total) by mouth at bedtime. 90 tablet 1  . oxyCODONE-acetaminophen (PERCOCET/ROXICET) 5-325 MG per tablet Take 1-2 tablets by mouth every 8 (eight) hours as needed for moderate pain. 30 tablet 0  . Probiotic Product (PROBIOTIC DAILY PO) Take 1 tablet by mouth daily.     No current facility-administered medications on file prior to visit.   Medical History:  Past  Medical History  Diagnosis Date  . Allergy   . Hyperlipidemia     "borderline; RX is preventative" (10/31/2014)  . Osteopenia   . Anemia   . Pre-diabetes   . Vitamin D deficiency   . Tobacco dependence   . Colon polyp   . Allergic rhinitis   . Kidney stones     "they passed"  . Family history of adverse reaction to anesthesia     "mother is hard to wake up; a little goes a long way w/her"  . Pneumonia 2003 X 1  . Chronic bronchitis     "got it q yr til I had my nose OR" (10/31/2014)  . GERD (gastroesophageal reflux disease)   . History of stomach ulcers   . Migraine     "used to get them alot; don't get them anymore" (10/31/2014)  . Seizures ~ 1962 X 1    "from an abscessed wisdom tooth"  . Arthritis     "fingers" (10/31/2014)  . Osteoarthritis of both knees   . DDD (degenerative disc disease), cervical   . DDD (degenerative disc disease), lumbar   . Depression     "short term when my sister passed away unexpectedly"   Allergies:  Allergies  Allergen Reactions  . Avelox [Moxifloxacin Hcl In Nacl] Other (See Comments)    aches  . Chantix [Varenicline]      Review of Systems:  Review of Systems  Constitutional: Negative.   HENT: Negative.   Eyes: Negative.   Respiratory: Negative.   Cardiovascular: Negative.   Gastrointestinal: Positive for abdominal pain (occ with certain movements. ). Negative for heartburn, nausea, vomiting, diarrhea, constipation, blood in stool and melena.  Genitourinary: Negative.   Musculoskeletal: Negative.   Skin: Negative.   Neurological: Negative.   Endo/Heme/Allergies: Negative.   Psychiatric/Behavioral: Positive for memory loss. Negative for depression, suicidal ideas, hallucinations and substance abuse. The patient is nervous/anxious and has insomnia.     Family history- Review and unchanged Social history- Review and unchanged Physical Exam: BP 120/78 mmHg  Pulse 80  Temp(Src) 97.7 F (36.5 C)  Resp 16  Ht 5' 4.75" (1.645 m)  Wt  160 lb (72.576 kg)  BMI 26.82 kg/m2 Wt Readings from Last 3 Encounters:  11/21/14 160 lb (72.576 kg)  10/31/14 163 lb 8 oz (74.163 kg)  10/30/14 164 lb (74.39 kg)   General Appearance: Well nourished, in no apparent distress. Eyes: PERRLA, EOMs, conjunctiva no swelling or erythema Sinuses: No Frontal/maxillary tenderness ENT/Mouth: Ext aud canals clear, TMs without erythema, bulging. No erythema, swelling, or exudate on post pharynx.  Tonsils not swollen or erythematous. Hearing normal.  Neck: Supple, thyroid normal.  Respiratory: Respiratory effort normal, BS equal bilaterally without rales, rhonchi, wheezing or stridor.  Cardio: RRR with no MRGs. Brisk peripheral pulses without edema.  Abdomen: Soft, + BS, well  healing lap scars without signs of infection, Slight tenderness RLQ without guarding/rebound, hernias, masses. Lymphatics: Non tender without lymphadenopathy.  Musculoskeletal: Full ROM, 5/5 strength, Normal gait Skin: Warm, dry without rashes, lesions, ecchymosis.  Neuro: Cranial nerves intact. Normal muscle tone, no cerebellar symptoms. Psych: Awake and oriented X 3, normal affect, Insight and Judgment appropriate.    Vicie Mutters, PA-C 10:53 AM [facility]

## 2014-11-22 LAB — BASIC METABOLIC PANEL WITH GFR
BUN: 10 mg/dL (ref 6–23)
CO2: 26 mEq/L (ref 19–32)
Calcium: 9.8 mg/dL (ref 8.4–10.5)
Chloride: 102 mEq/L (ref 96–112)
Creat: 0.78 mg/dL (ref 0.50–1.10)
GFR, Est African American: >89 mL/min
GFR, Est Non African American: 81 mL/min
Glucose, Bld: 93 mg/dL (ref 70–99)
Potassium: 4.4 mEq/L (ref 3.5–5.3)
Sodium: 137 mEq/L (ref 135–145)

## 2014-11-22 LAB — HEPATIC FUNCTION PANEL
ALT: 16 U/L (ref 0–35)
AST: 16 U/L (ref 0–37)
Albumin: 4.3 g/dL (ref 3.5–5.2)
Alkaline Phosphatase: 67 U/L (ref 39–117)
Bilirubin, Direct: 0.1 mg/dL (ref 0.0–0.3)
Indirect Bilirubin: 0.4 mg/dL (ref 0.2–1.2)
Total Bilirubin: 0.5 mg/dL (ref 0.2–1.2)
Total Protein: 7 g/dL (ref 6.0–8.3)

## 2014-11-22 LAB — LIPID PANEL
Cholesterol: 163 mg/dL (ref 0–200)
HDL: 79 mg/dL (ref >=46)
LDL Cholesterol: 70 mg/dL (ref 0–99)
Total CHOL/HDL Ratio: 2.1 Ratio
Triglycerides: 72 mg/dL (ref <150)
VLDL: 14 mg/dL (ref 0–40)

## 2014-11-22 LAB — TSH: TSH: 1.054 u[IU]/mL (ref 0.350–4.500)

## 2014-11-22 LAB — MAGNESIUM: Magnesium: 2 mg/dL (ref 1.5–2.5)

## 2014-11-22 LAB — VITAMIN D 25 HYDROXY (VIT D DEFICIENCY, FRACTURES): Vit D, 25-Hydroxy: 48 ng/mL (ref 30–100)

## 2014-11-22 LAB — INSULIN, FASTING: Insulin fasting, serum: 8.4 u[IU]/mL (ref 2.0–19.6)

## 2015-01-02 ENCOUNTER — Ambulatory Visit: Payer: Self-pay | Admitting: Internal Medicine

## 2015-01-08 ENCOUNTER — Telehealth: Payer: Self-pay | Admitting: Physician Assistant

## 2015-01-08 NOTE — Telephone Encounter (Signed)
Spoke with patient and she reports rectal pain off and on for 3 weeks. Sometimes occurs when walking and sometimes when sitting. Due for Colonoscopy later this year. Scheduled with Nicoletta Ba, PA on 01/23/15 at 2:00 PM.

## 2015-01-23 ENCOUNTER — Ambulatory Visit (INDEPENDENT_AMBULATORY_CARE_PROVIDER_SITE_OTHER): Payer: 59 | Admitting: Physician Assistant

## 2015-01-23 ENCOUNTER — Encounter: Payer: Self-pay | Admitting: Physician Assistant

## 2015-01-23 VITALS — BP 122/78 | HR 84 | Ht 64.75 in | Wt 154.1 lb

## 2015-01-23 DIAGNOSIS — Z9889 Other specified postprocedural states: Secondary | ICD-10-CM

## 2015-01-23 DIAGNOSIS — K6289 Other specified diseases of anus and rectum: Secondary | ICD-10-CM

## 2015-01-23 DIAGNOSIS — Z86010 Personal history of colonic polyps: Secondary | ICD-10-CM | POA: Diagnosis not present

## 2015-01-23 DIAGNOSIS — Z9049 Acquired absence of other specified parts of digestive tract: Secondary | ICD-10-CM | POA: Insufficient documentation

## 2015-01-23 MED ORDER — HYDROCORTISONE ACETATE 25 MG RE SUPP: RECTAL | Status: DC

## 2015-01-23 MED ORDER — POLYETHYLENE GLYCOL 3350 17 GM/SCOOP PO POWD: 1.0000 | Freq: Every day | ORAL | Status: DC

## 2015-01-23 NOTE — Patient Instructions (Addendum)
We sent a prescription for the generic Miralax ( Polyethylene Glycol)  to [facility] [address] Also Anusol [facility] Supposotories.    You have been scheduled for a colonoscopy. Please follow written instructions given to you at your visit today.  Please pick up your prep supplies at the pharmacy within the next 1-3 days. If you use inhalers (even only as needed), please bring them with you on the day of your procedure. Your physician has requested that you go to www.startemmi.com and enter the access code given to you at your visit today. This web site gives a general overview about your procedure. However, you should still follow specific instructions given to you by our office regarding your preparation for the procedure.

## 2015-01-23 NOTE — Progress Notes (Signed)
Patient ID: Karen Barrera, female   DOB: 1951-04-08, 64 y.o.   MRN: [id number]   Subjective:    Patient ID: Karen Barrera, female    DOB: [birth year], 64 y.o.   MRN: [id number]  HPI Karen Barrera is a pleasant 64 year old white female former patient of Dr. Buel Ream who wishes to establish with Dr. Carlean Purl. She has family history of colon cancer in an aunt and uncle on the maternal side of family and history of colon polyps in her mother and herself. Patient last had colonoscopy in December 2013 was found to have multiple sessile polyps measuring 3-5 mm. These were removed and 2 were tubular adenomas 2 were hyperplastic polyps. She was recommended for 3 year interval follow-up. She was also noted to have prominent. Anal papillae Patient was seen here earlier this year with abdominal pain and ultimately underwent a laparoscopic appendectomy in April 2016. She says she has done well since her surgery. She started noticing internal rectal pain about a month ago and says she has had pain intermittent ever since. She describes it as a pressure or achy type sensation..Not associated with any bleeding. She has not had any changes in her bowel habits or abdominal pain. Defecation does not aggravate her symptoms.  Review of Systems Pertinent positive and negative review of systems were noted in the above HPI section.  All other review of systems was otherwise negative.  Outpatient Encounter Prescriptions as of 01/23/2015  Medication Sig  . albuterol (PROVENTIL HFA;VENTOLIN HFA) 108 (90 BASE) MCG/ACT inhaler Inhale 2 puffs into the lungs every 6 (six) hours as needed for wheezing or shortness of breath.  Marland Kitchen aspirin 81 MG tablet Take 81 mg by mouth daily.  . B Complex-C (SUPER B COMPLEX PO) Take by mouth daily.  . Cholecalciferol (VITAMIN D-3) 5000 UNITS TABS Take by mouth daily.  . Coenzyme Q10 (COQ10) 200 MG CAPS Take 200 mg by mouth daily.  . diphenhydrAMINE (BENADRYL) 25 MG tablet Take 25 mg by mouth every 6  (six) hours as needed.  Marland Kitchen EPINEPHrine (EPI-PEN) 0.3 mg/0.3 mL SOAJ injection Inject 0.3 mLs (0.3 mg total) into the muscle once.  . Magnesium 300 MG CAPS Take by mouth daily.  . Melatonin 5 MG CAPS Take 2 capsules by mouth at bedtime.   . montelukast (SINGULAIR) 10 MG tablet Take 1 tablet (10 mg total) by mouth at bedtime.  . Probiotic Product (PROBIOTIC DAILY PO) Take 1 tablet by mouth daily.  . rosuvastatin (CRESTOR) 10 MG tablet Take 1 tablet (10 mg total) by mouth daily.  . [DISCONTINUED] buPROPion (WELLBUTRIN XL) 150 MG 24 hr tablet Take 1 tablet (150 mg total) by mouth 2 (two) times daily. Start by taking 1 tablet daily x 1 week. Then increase to twice daily.  . [DISCONTINUED] ibuprofen (ADVIL,MOTRIN) 600 MG tablet Take 1 tablet (600 mg total) by mouth every 8 (eight) hours as needed (mild pain).   No facility-administered encounter medications on file as of 01/23/2015.   Allergies  Allergen Reactions  . Avelox [Moxifloxacin Hcl In Nacl] Other (See Comments)    aches  . Chantix [Varenicline]    Patient Active Problem List   Diagnosis Date Noted  . Hx of adenomatous colonic polyps 01/23/2015  . S/P laparoscopic appendectomy 01/23/2015  . Medication management 11/21/2014  . Dyspnea 10/31/2013  . Depression   . Hyperlipidemia   . Anemia   . Pre-diabetes   . Vitamin D deficiency   . Tobacco dependence   . DDD (degenerative disc disease)   .  Seasonal and perennial allergic rhinitis 07/03/2013   History   Social History  . Marital Status: Married    Spouse Name: N/A  . Number of Children: N/A  . Years of Education: N/A   Occupational History  . Retired    Social History Main Topics  . Smoking status: Current Every Day Smoker -- 0.50 packs/day for 25 years    Types: Cigarettes  . Smokeless tobacco: Never Used     Comment: Pt quit smoking during pregnancies, also 3 yrs ago for 6 mos; "and several years @ times"  . Alcohol Use: No  . Drug Use: No  . Sexual Activity: Not  Currently   Other Topics Concern  . Not on file   Social History Narrative    Ms. Arrellano's family history includes Arthritis in her paternal grandmother; Cancer in her maternal aunt, mother, paternal aunt, paternal grandmother, and paternal uncle; Colon cancer (age of onset: 41) in her other and other; Diabetes in her mother; Hyperlipidemia in her maternal grandfather; Seizures in her sister; Stroke in her maternal grandmother. There is no history of Stomach cancer.      Objective:    Filed Vitals:   01/23/15 1352  BP: 122/78  Pulse: 84    Physical Exam well-developed white female in no acute distress, pleasant blood pressure 122/78 pulse 84 height 5 foot 4 weight 154. HEENT; nontraumatic normocephalic EOMI PERRLA sclerae anicteric, Supple; no JVD, Cardiovascular; regular rate and rhythm with S1-S2 no murmur or gallop, Pulmonary; clear bilaterally, Abdomen; soft nontender nondistended bowel sounds are active there is no palpable mass or hepatosplenomegaly incisional ports from appendectomy well-healed, Rectal; exam no external lesion noted, on anoscopy she does have one internal hemorrhoid and a prominent papilla a non-tender to digital exam and no palpable mass or lesion, Extremities; no clubbing cyanosis or edema skin warm and dry, Psych ;mood and affect appropriate       Assessment & Plan:   #1 64 yo female with one month hx of internal rectal pain- probably secondary to internal hemorrhoid though not edematous on exam  #2 hx of adenomatous colon polyps- last colon 2013 #3 family hx of colon cancer-maternal ant and maternal uncle #4 s/p appendectomy 10/2014  Plan; start Anusol hc supp  qhs x 2 weeks then prn  Schedule for colonoscopy with Dr Carlean Purl -procedure discussed in detail with pt and she  is agreeable to proceed   Alfredia Ferguson PA-C 01/23/2015   Cc: [name], MD

## 2015-01-24 NOTE — Progress Notes (Signed)
Agree with Ms. Esterwood's assessment and plan. Carl E. Gessner, MD, FACG   

## 2015-02-16 ENCOUNTER — Ambulatory Visit (AMBULATORY_SURGERY_CENTER): Payer: 59 | Admitting: Internal Medicine

## 2015-02-16 ENCOUNTER — Encounter: Payer: Self-pay | Admitting: Internal Medicine

## 2015-02-16 VITALS — BP 108/66 | HR 62 | Temp 98.3°F | Resp 17 | Ht 64.5 in | Wt 154.0 lb

## 2015-02-16 DIAGNOSIS — D123 Benign neoplasm of transverse colon: Secondary | ICD-10-CM

## 2015-02-16 DIAGNOSIS — Z86010 Personal history of colonic polyps: Secondary | ICD-10-CM | POA: Diagnosis not present

## 2015-02-16 DIAGNOSIS — D124 Benign neoplasm of descending colon: Secondary | ICD-10-CM

## 2015-02-16 DIAGNOSIS — Z800 Family history of malignant neoplasm of digestive organs: Secondary | ICD-10-CM

## 2015-02-16 MED ORDER — SODIUM CHLORIDE 0.9 % IV SOLN: 500.0000 mL | INTRAVENOUS | Status: DC

## 2015-02-16 MED ORDER — HYDROCORTISONE ACETATE 25 MG RE SUPP: RECTAL | Status: DC

## 2015-02-16 NOTE — Progress Notes (Signed)
To recovery, report to Scott, RN, VSS 

## 2015-02-16 NOTE — Op Note (Signed)
[facility]  [address] [place], [address]   COLONOSCOPY PROCEDURE REPORT  PATIENT: Karen Barrera, Karen Barrera  MR#: [id number] BIRTHDATE: [birth year] , 63  yrs. old GENDER: female ENDOSCOPIST: Gatha Mayer, MD, [facility] PROCEDURE DATE:  02/16/2015 PROCEDURE:   Colonoscopy, surveillance and Colonoscopy with snare polypectomy First Screening Colonoscopy - Avg.  risk and is 50 yrs.  old or older - No.  Prior Negative Screening - Now for repeat screening. N/A  History of Adenoma - Now for follow-up colonoscopy & has been > or = to 3 yrs.  Yes hx of adenoma.  Has been 3 or more years since last colonoscopy.  Polyps removed today? Yes ASA CLASS:   Class II INDICATIONS:Surveillance due to prior colonic neoplasia and PH Colon Adenoma. MEDICATIONS: Propofol 200 mg IV  DESCRIPTION OF PROCEDURE:   After the risks benefits and alternatives of the procedure were thoroughly explained, informed consent was obtained.  The digital rectal exam revealed no abnormalities of the rectum.   The LB [id number]  endoscope was introduced through the anus and advanced to the cecum, which was identified by both the appendix and ileocecal valve. No adverse events experienced.   The quality of the prep was excellent. (MiraLax was used)  The instrument was then slowly withdrawn as the colon was fully examined. Estimated blood loss is zero unless otherwise noted in this procedure report.  COLON FINDINGS: Four sessile polyps ranging from 2 to 69m in size were found in the descending colon and distal transverse colon. Polypectomies were performed with a cold snare.  The resection was complete, the polyp tissue was completely retrieved and sent to histology.   Small internal hemorrhoids were found.   Small hypertrophic anal papilla was found in the anal canal.   The examination was otherwise normal.  Retroflexed views revealed internal hemorrhoids. The time to cecum = 3.6 Withdrawal time = 14.5    The scope was withdrawn and the procedure completed. COMPLICATIONS: There were no immediate complications.  ENDOSCOPIC IMPRESSION: 1.   Four sessile polyps ranging from 2 to 497min size were found in the descending colon and distal transverse colon; polypectomies were performed with a cold snare 2.   Small internal hemorrhoids 3.   Small hypertrophic anal papilla in the anal canal 4.   The examination was otherwise normal  RECOMMENDATIONS: 1.  Timing of repeat colonoscopy will be determined by pathology findings. 2.  Refill hydrocortisone suppositories see me prn if not helping  eSigned:  CaGatha MayerMD, FABon Secours Rapp[facility]annock [facility]7/22/2016 8:38 AM   cc: The Patient   PATIENT NAME:  [name], PryceR#: [id number]

## 2015-02-16 NOTE — Patient Instructions (Addendum)
I found and removed 4 small polyps that look benign. You do have small hemorrhoids also.  I will let you know pathology results and when to have another routine colonoscopy by mail.  I have prescribed some more suppositories for the hemorrhoids. If they continue to bother you please come back to see me.  I appreciate the opportunity to care for you. Gatha Mayer, MD, FACG YOU HAD AN ENDOSCOPIC PROCEDURE TODAY AT [facility]:   Refer to the procedure report that was given to you for any specific questions about what was found during the examination.  If the procedure report does not answer your questions, please call your gastroenterologist to clarify.  If you requested that your care partner not be given the details of your procedure findings, then the procedure report has been included in a sealed envelope for you to review at your convenience later.  YOU SHOULD EXPECT: Some feelings of bloating in the abdomen. Passage of more gas than usual.  Walking can help get rid of the air that was put into your GI tract during the procedure and reduce the bloating. If you had a lower endoscopy (such as a colonoscopy or flexible sigmoidoscopy) you may notice spotting of blood in your stool or on the toilet paper. If you underwent a bowel prep for your procedure, you may not have a normal bowel movement for a few days.  Please Note:  You might notice some irritation and congestion in your nose or some drainage.  This is from the oxygen used during your procedure.  There is no need for concern and it should clear up in a day or so.  SYMPTOMS TO REPORT IMMEDIATELY:   Following lower endoscopy (colonoscopy or flexible sigmoidoscopy):  Excessive amounts of blood in the stool  Significant tenderness or worsening of abdominal pains  Swelling of the abdomen that is new, acute  Fever of 100F or higher  For urgent or emergent issues, a gastroenterologist can be reached at any hour  by calling [phone].   DIET: Your first meal following the procedure should be a small meal and then it is ok to progress to your normal diet. Heavy or fried foods are harder to digest and may make you feel nauseous or bloated.  Likewise, meals heavy in dairy and vegetables can increase bloating.  Drink plenty of fluids but you should avoid alcoholic beverages for 24 hours.  ACTIVITY:  You should plan to take it easy for the rest of today and you should NOT DRIVE or use heavy machinery until tomorrow (because of the sedation medicines used during the test).    FOLLOW UP: Our staff will call the number listed on your records the next business day following your procedure to check on you and address any questions or concerns that you may have regarding the information given to you following your procedure. If we do not reach you, we will leave a message.  However, if you are feeling well and you are not experiencing any problems, there is no need to return our call.  We will assume that you have returned to your regular daily activities without incident.  If any biopsies were taken you will be contacted by phone or by letter within the next 1-3 weeks.  Please call us at [phone] if you have not heard about the biopsies in 3 weeks.    SIGNATURES/CONFIDENTIALITY: You and/or your care partner have signed paperwork which will be entered  into your electronic medical record.  These signatures attest to the fact that that the information above on your After Visit Summary has been reviewed and is understood.  Full responsibility of the confidentiality of this discharge information lies with you and/or your care-partner.  Polyp and hemorrhoid information given.

## 2015-02-16 NOTE — Progress Notes (Signed)
Called to room to assist during endoscopic procedure.  Patient ID and intended procedure confirmed with present staff. Received instructions for my participation in the procedure from the performing physician.  

## 2015-02-19 ENCOUNTER — Other Ambulatory Visit: Payer: Self-pay | Admitting: Gynecology

## 2015-02-19 ENCOUNTER — Telehealth: Payer: Self-pay | Admitting: *Deleted

## 2015-02-19 NOTE — Telephone Encounter (Signed)
  Follow up Call-  Call back number 02/16/2015 06/28/2012  Post procedure Call Back phone  # [phone]  Permission to leave phone message Yes Yes     Patient questions:  Do you have a fever, pain , or abdominal swelling? No. Pain Score  0 *  Have you tolerated food without any problems? Yes.    Have you been able to return to your normal activities? Yes.    Do you have any questions about your discharge instructions: Diet   No. Medications  No. Follow up visit  No.  Do you have questions or concerns about your Care? No.  Actions: * If pain score is 4 or above: No action needed, pain <4.

## 2015-02-20 ENCOUNTER — Encounter: Payer: Self-pay | Admitting: Internal Medicine

## 2015-02-20 DIAGNOSIS — Z86010 Personal history of colonic polyps: Secondary | ICD-10-CM

## 2015-02-20 LAB — CYTOLOGY - PAP

## 2015-02-20 NOTE — Progress Notes (Signed)
Quick Note:  2 diminutive adenomas - repet colon 2021 ______

## 2015-02-22 LAB — HM PAP SMEAR

## 2015-03-12 ENCOUNTER — Ambulatory Visit: Payer: Self-pay | Admitting: Internal Medicine

## 2015-03-14 ENCOUNTER — Other Ambulatory Visit: Payer: Self-pay | Admitting: Physician Assistant

## 2015-03-23 ENCOUNTER — Ambulatory Visit (HOSPITAL_COMMUNITY)
Admission: RE | Admit: 2015-03-23 | Discharge: 2015-03-23 | Disposition: A | Discharge status: Home / Self Care | Payer: 59 | Source: Ambulatory Visit | Attending: Physician Assistant | Admitting: Physician Assistant

## 2015-03-23 ENCOUNTER — Ambulatory Visit (INDEPENDENT_AMBULATORY_CARE_PROVIDER_SITE_OTHER): Payer: 59 | Admitting: Physician Assistant

## 2015-03-23 ENCOUNTER — Encounter: Payer: Self-pay | Admitting: Physician Assistant

## 2015-03-23 VITALS — BP 116/68 | HR 80 | Temp 97.5°F | Resp 16 | Ht 64.75 in | Wt 160.0 lb

## 2015-03-23 DIAGNOSIS — R05 Cough: Secondary | ICD-10-CM | POA: Insufficient documentation

## 2015-03-23 DIAGNOSIS — F17200 Nicotine dependence, unspecified, uncomplicated: Secondary | ICD-10-CM | POA: Insufficient documentation

## 2015-03-23 DIAGNOSIS — R079 Chest pain, unspecified: Secondary | ICD-10-CM | POA: Insufficient documentation

## 2015-03-23 DIAGNOSIS — M940 Chondrocostal junction syndrome [Tietze]: Secondary | ICD-10-CM | POA: Diagnosis not present

## 2015-03-23 DIAGNOSIS — R059 Cough, unspecified: Secondary | ICD-10-CM

## 2015-03-23 MED ORDER — PREDNISONE 20 MG PO TABS: ORAL_TABLET | ORAL | Status: DC

## 2015-03-23 MED ORDER — BACLOFEN 10 MG PO TABS: 10.0000 mg | ORAL_TABLET | Freq: Every day | ORAL | Status: DC

## 2015-03-23 MED ORDER — TRAMADOL HCL 50 MG PO TABS: 50.0000 mg | ORAL_TABLET | Freq: Four times a day (QID) | ORAL | Status: DC | PRN

## 2015-03-23 NOTE — Progress Notes (Signed)
Subjective:    Patient ID: Karen Barrera, female    DOB: Dec 04, 1950, 64 y.o.   MRN: [id number]  HPI 64 y.o. smoking female with history of preDM, chol, anemia pesents with chest pain/cough. She was leaning to pick something up and pushed her chest onto a cough frame on Saturday, She went to urgent care Saturday, given mobic and flexeril. It was getting better until this AM at 530, start to have sharp substernal pain that radiates to her back and radiates across chest with some SOB, worse with deep breath and reaching for things, very mechanical. This AM has had yellow mucus. Denies palpitations, bruising/rash.    Blood pressure 116/68, pulse 80, temperature 97.5 F (36.4 C), resp. rate 16, height 5' 4.75" (1.645 m), weight 160 lb (72.576 kg).  Current Outpatient Prescriptions on File Prior to Visit  Medication Sig Dispense Refill  . albuterol (PROVENTIL HFA;VENTOLIN HFA) 108 (90 BASE) MCG/ACT inhaler Inhale 2 puffs into the lungs every 6 (six) hours as needed for wheezing or shortness of breath. 1 Inhaler 2  . aspirin 81 MG tablet Take 81 mg by mouth daily.    . B Complex-C (SUPER B COMPLEX PO) Take by mouth daily.    . Cholecalciferol (VITAMIN D-3) 5000 UNITS TABS Take by mouth daily.    . Coenzyme Q10 (COQ10) 200 MG CAPS Take 200 mg by mouth daily.    . diphenhydrAMINE (BENADRYL) 25 MG tablet Take 25 mg by mouth every 6 (six) hours as needed.    Marland Kitchen EPINEPHrine (EPI-PEN) 0.3 mg/0.3 mL SOAJ injection Inject 0.3 mLs (0.3 mg total) into the muscle once. 2 Device 2  . hydrocortisone (ANUSOL-HC) 25 MG suppository Use 1 suppository at bedtime for 2 weeks then as needed. 24 suppository 2  . Magnesium 300 MG CAPS Take by mouth daily.    . Melatonin 5 MG CAPS Take 2 capsules by mouth at bedtime.     . montelukast (SINGULAIR) 10 MG tablet Take 1 tablet (10 mg total) by mouth at bedtime. 90 tablet 1  . Probiotic Product (PROBIOTIC DAILY PO) Take 1 tablet by mouth daily.    . rosuvastatin (CRESTOR)  10 MG tablet TAKE 1 TABLET (10 MG TOTAL) BY MOUTH DAILY. 30 tablet 3   No current facility-administered medications on file prior to visit.   Past Medical History  Diagnosis Date  . Allergy   . Hyperlipidemia     "borderline; RX is preventative" (10/31/2014)  . Osteopenia   . Anemia   . Pre-diabetes   . Vitamin D deficiency   . Tobacco dependence   . Colon polyp   . Allergic rhinitis   . Kidney stones     "they passed"  . Family history of adverse reaction to anesthesia     "mother is hard to wake up; a little goes a long way w/her"  . Pneumonia 2003 X 1  . Chronic bronchitis     "got it q yr til I had my nose OR" (10/31/2014)  . GERD (gastroesophageal reflux disease)   . History of stomach ulcers   . Migraine     "used to get them alot; don't get them anymore" (10/31/2014)  . Seizures ~ 1962 X 1    "from an abscessed wisdom tooth"  . Arthritis     "fingers" (10/31/2014)  . Osteoarthritis of both knees   . DDD (degenerative disc disease), cervical   . DDD (degenerative disc disease), lumbar   . Depression     "  short term when my sister passed away unexpectedly"  . Acute perforated appendicitis 10/31/2014    Review of Systems  Constitutional: Negative for fever, chills, activity change and fatigue.  HENT: Negative.   Eyes: Negative.   Respiratory: Positive for cough and wheezing. Negative for apnea, choking, chest tightness, shortness of breath and stridor.   Cardiovascular: Positive for chest pain. Negative for palpitations and leg swelling.  Gastrointestinal: Negative.   Genitourinary: Negative.   Musculoskeletal: Negative.   Neurological: Negative.        Objective:   Physical Exam  Constitutional: She is oriented to person, place, and time. She appears well-developed and well-nourished. No distress.  Cardiovascular: Normal rate, regular rhythm and normal heart sounds.   No murmur heard. Pulmonary/Chest: Effort normal and breath sounds normal. She has no wheezes.   Musculoskeletal:  + chest wall tenderness to palpation, very mechanical pain with change in position.   Neurological: She is alert and oriented to person, place, and time. No cranial nerve deficit.  Skin: Skin is warm and dry. No rash noted.  No ecchymosis.        Assessment & Plan:  Atypical chest pain, patient very anxious, some yellow mucus with cough  Will get CXR, EKG rule out fracture, infection, and pericarditis.  Likely costochondritis will give prednisone, baclofen, tramadol.  If worse over weekend go to ER.   COPD/cough, smoker Advised to quit smoking Will get neubulizer with albuterol Get CXR

## 2015-03-23 NOTE — Patient Instructions (Signed)
Costochondritis Costochondritis, sometimes called Tietze syndrome, is a swelling and irritation (inflammation) of the tissue (cartilage) that connects your ribs with your breastbone (sternum). It causes pain in the chest and rib area. Costochondritis usually goes away on its own over time. It can take up to 6 weeks or longer to get better, especially if you are unable to limit your activities. CAUSES  Some cases of costochondritis have no known cause. Possible causes include:  Injury (trauma).  Exercise or activity such as lifting.  Severe coughing. SIGNS AND SYMPTOMS  Pain and tenderness in the chest and rib area.  Pain that gets worse when coughing or taking deep breaths.  Pain that gets worse with specific movements. DIAGNOSIS  Your health care provider will do a physical exam and ask about your symptoms. Chest X-rays or other tests may be done to rule out other problems. TREATMENT  Costochondritis usually goes away on its own over time. Your health care provider may prescribe medicine to help relieve pain. HOME CARE INSTRUCTIONS   Avoid exhausting physical activity. Try not to strain your ribs during normal activity. This would include any activities using chest, abdominal, and side muscles, especially if heavy weights are used.  Apply ice to the affected area for the first 2 days after the pain begins.  Put ice in a plastic bag.  Place a towel between your skin and the bag.  Leave the ice on for 20 minutes, 2-3 times a day.  Only take over-the-counter or prescription medicines as directed by your health care provider. SEEK MEDICAL CARE IF:  You have redness or swelling at the rib joints. These are signs of infection.  Your pain does not go away despite rest or medicine. SEEK IMMEDIATE MEDICAL CARE IF:   Your pain increases or you are very uncomfortable.  You have shortness of breath or difficulty breathing.  You cough up blood.  You have worse chest pains,  sweating, or vomiting.  You have a fever or persistent symptoms for more than 2-3 days.  You have a fever and your symptoms suddenly get worse. MAKE SURE YOU:   Understand these instructions.  Will watch your condition.  Will get help right away if you are not doing well or get worse. Document Released: 04/23/2005 Document Revised: 05/04/2013 Document Reviewed: 02/15/2013 ExitCare Patient Information 2015 [employer]. This information is not intended to replace advice given to you by your health care provider. Make sure you discuss any questions you have with your health care provider.  

## 2015-03-28 ENCOUNTER — Other Ambulatory Visit: Payer: Self-pay | Admitting: Physician Assistant

## 2015-03-28 MED ORDER — GABAPENTIN 300 MG PO CAPS: ORAL_CAPSULE | ORAL | Status: DC

## 2015-03-28 MED ORDER — PREDNISONE 20 MG PO TABS: ORAL_TABLET | ORAL | Status: AC

## 2015-05-14 ENCOUNTER — Encounter: Payer: Self-pay | Admitting: Emergency Medicine

## 2015-05-14 ENCOUNTER — Encounter: Payer: Self-pay | Admitting: Internal Medicine

## 2015-05-14 ENCOUNTER — Ambulatory Visit (INDEPENDENT_AMBULATORY_CARE_PROVIDER_SITE_OTHER): Payer: 59 | Admitting: Internal Medicine

## 2015-05-14 VITALS — BP 116/64 | HR 58 | Temp 98.0°F | Resp 16 | Ht 65.0 in | Wt 156.0 lb

## 2015-05-14 DIAGNOSIS — Z136 Encounter for screening for cardiovascular disorders: Secondary | ICD-10-CM

## 2015-05-14 DIAGNOSIS — Z1389 Encounter for screening for other disorder: Secondary | ICD-10-CM | POA: Diagnosis not present

## 2015-05-14 DIAGNOSIS — Z789 Other specified health status: Secondary | ICD-10-CM | POA: Diagnosis not present

## 2015-05-14 DIAGNOSIS — Z130 Encounter for screening for diseases of the blood and blood-forming organs and certain disorders involving the immune mechanism: Secondary | ICD-10-CM

## 2015-05-14 DIAGNOSIS — E559 Vitamin D deficiency, unspecified: Secondary | ICD-10-CM

## 2015-05-14 DIAGNOSIS — Z1331 Encounter for screening for depression: Secondary | ICD-10-CM

## 2015-05-14 DIAGNOSIS — E785 Hyperlipidemia, unspecified: Secondary | ICD-10-CM

## 2015-05-14 DIAGNOSIS — R7303 Prediabetes: Secondary | ICD-10-CM

## 2015-05-14 DIAGNOSIS — F17200 Nicotine dependence, unspecified, uncomplicated: Secondary | ICD-10-CM

## 2015-05-14 DIAGNOSIS — Z1212 Encounter for screening for malignant neoplasm of rectum: Secondary | ICD-10-CM

## 2015-05-14 DIAGNOSIS — Z23 Encounter for immunization: Secondary | ICD-10-CM

## 2015-05-14 DIAGNOSIS — Z1329 Encounter for screening for other suspected endocrine disorder: Secondary | ICD-10-CM

## 2015-05-14 DIAGNOSIS — Z0000 Encounter for general adult medical examination without abnormal findings: Secondary | ICD-10-CM

## 2015-05-14 DIAGNOSIS — Z79899 Other long term (current) drug therapy: Secondary | ICD-10-CM

## 2015-05-14 DIAGNOSIS — Z9181 History of falling: Secondary | ICD-10-CM

## 2015-05-14 LAB — LIPID PANEL
Cholesterol: 177 mg/dL (ref 125–200)
HDL: 84 mg/dL (ref >=46)
LDL Cholesterol: 81 mg/dL (ref <130)
Total CHOL/HDL Ratio: 2.1 Ratio (ref <=5.0)
Triglycerides: 60 mg/dL (ref <150)
VLDL: 12 mg/dL (ref <30)

## 2015-05-14 LAB — CBC WITH DIFFERENTIAL/PLATELET
Basophils Absolute: 0.1 10*3/uL (ref 0.0–0.1)
Basophils Relative: 1 % (ref 0–1)
Eosinophils Absolute: 0.1 10*3/uL (ref 0.0–0.7)
Eosinophils Relative: 1 % (ref 0–5)
HCT: 43.3 % (ref 36.0–46.0)
Hemoglobin: 14.2 g/dL (ref 12.0–15.0)
Lymphocytes Relative: 18 % (ref 12–46)
Lymphs Abs: 1.6 10*3/uL (ref 0.7–4.0)
MCH: 30.5 pg (ref 26.0–34.0)
MCHC: 32.8 g/dL (ref 30.0–36.0)
MCV: 92.9 fL (ref 78.0–100.0)
MPV: 10 fL (ref 8.6–12.4)
Monocytes Absolute: 0.4 10*3/uL (ref 0.1–1.0)
Monocytes Relative: 5 % (ref 3–12)
Neutro Abs: 6.7 10*3/uL (ref 1.7–7.7)
Neutrophils Relative %: 75 % (ref 43–77)
Platelets: 214 10*3/uL (ref 150–400)
RBC: 4.66 MIL/uL (ref 3.87–5.11)
RDW: 13.6 % (ref 11.5–15.5)
WBC: 8.9 10*3/uL (ref 4.0–10.5)

## 2015-05-14 LAB — MAGNESIUM: Magnesium: 2.3 mg/dL (ref 1.5–2.5)

## 2015-05-14 LAB — IRON AND TIBC
%SAT: 23 % (ref 11–50)
Iron: 87 ug/dL (ref 45–160)
TIBC: 372 ug/dL (ref 250–450)
UIBC: 285 ug/dL (ref 125–400)

## 2015-05-14 LAB — BASIC METABOLIC PANEL WITH GFR
BUN: 11 mg/dL (ref 7–25)
CO2: 25 mmol/L (ref 20–31)
Calcium: 9.2 mg/dL (ref 8.6–10.4)
Chloride: 103 mmol/L (ref 98–110)
Creat: 0.76 mg/dL (ref 0.50–0.99)
GFR, Est African American: >89 mL/min (ref >=60)
GFR, Est Non African American: 84 mL/min (ref >=60)
Glucose, Bld: 83 mg/dL (ref 65–99)
Potassium: 4.2 mmol/L (ref 3.5–5.3)
Sodium: 138 mmol/L (ref 135–146)

## 2015-05-14 LAB — HEPATIC FUNCTION PANEL
ALT: 14 U/L (ref 6–29)
AST: 18 U/L (ref 10–35)
Albumin: 4.6 g/dL (ref 3.6–5.1)
Alkaline Phosphatase: 69 U/L (ref 33–130)
Bilirubin, Direct: 0.1 mg/dL (ref <=0.2)
Indirect Bilirubin: 0.4 mg/dL (ref 0.2–1.2)
Total Bilirubin: 0.5 mg/dL (ref 0.2–1.2)
Total Protein: 6.9 g/dL (ref 6.1–8.1)

## 2015-05-14 LAB — HEMOGLOBIN A1C
Hgb A1c MFr Bld: 5.9 % — ABNORMAL HIGH (ref <5.7)
Mean Plasma Glucose: 123 mg/dL — ABNORMAL HIGH (ref <117)

## 2015-05-14 MED ORDER — ALBUTEROL SULFATE HFA 108 (90 BASE) MCG/ACT IN AERS: 2.0000 | INHALATION_SPRAY | Freq: Four times a day (QID) | RESPIRATORY_TRACT | Status: DC | PRN

## 2015-05-14 MED ORDER — SERTRALINE HCL 50 MG PO TABS: ORAL_TABLET | ORAL | Status: DC

## 2015-05-14 MED ORDER — ROSUVASTATIN CALCIUM 10 MG PO TABS: ORAL_TABLET | ORAL | Status: DC

## 2015-05-14 MED ORDER — MONTELUKAST SODIUM 10 MG PO TABS: 10.0000 mg | ORAL_TABLET | Freq: Every day | ORAL | Status: DC

## 2015-05-14 NOTE — Progress Notes (Signed)
Patient ID: Karen Barrera, female   DOB: 03/14/1951, 64 y.o.   MRN: [id number]  Complete Physical  Assessment and Plan: 1. Hyperlipidemia -cont crestor - Lipid panel  2. Pre-diabetes  - Hemoglobin A1c - Insulin, random  3. Vitamin D deficiency -cont supplement - Vit D  25 hydroxy (rtn osteoporosis monitoring)  4. Medication management  - CBC with Differential/Platelet - BASIC METABOLIC PANEL WITH GFR - Hepatic function panel - Magnesium  5. Screening for cardiovascular condition  - EKG 12-Lead - [place], RETROPERITNL ABD,  LTD  6. Screening for thyroid disorder  - TSH  7. Screening, deficiency anemia, iron  - Iron and TIBC - Vitamin B12  8. Screening for rectal cancer  - POC Hemoccult Bld/Stl (3-Cd Home Screen); Future  9. Screening for hematuria or proteinuria  - Urinalysis, Routine w reflex microscopic (not at [facility]) - Microalbumin / creatinine urine ratio  10. Tobacco dependence -stop smoking -start zoloft    Discussed med's effects and SE's. Screening labs and tests as requested with regular follow-up as recommended.  HPI  64 y.o. female  presents for a complete physical.  Her blood pressure has been controlled at home, today their BP is BP: 116/64 mmHg.  She does workout. She denies chest pain, shortness of breath, dizziness.   She is on cholesterol medication and denies myalgias. Her cholesterol is at goal. The cholesterol last visit was:  Lab Results  Component Value Date   CHOL 163 11/21/2014   HDL 79 11/21/2014   LDLCALC 70 11/21/2014   TRIG 72 11/21/2014   CHOLHDL 2.1 11/21/2014  .  She has been working on diet and exercise for prediabetes, she is on bASA, she is not on ACE/ARB and denies foot ulcerations, hyperglycemia, hypoglycemia , increased appetite, nausea, paresthesia of the feet, polydipsia, polyuria, visual disturbances, vomiting and weight loss. Last A1C in the office was:  Lab Results  Component Value Date   HGBA1C 6.2*  11/21/2014    Patient is on Vitamin D supplement.   Lab Results  Component Value Date   VD25OH 48 11/21/2014     Patient reports that she has been having a lot of hot flashes and she is miserable.  She reports that it happens day and night and she reports that she cannot tolerate them any more.    She is seeing Obgyn.  She is status post hysterectomy.  She is getting regular mammograms.    She reports that she is due to have colonoscopy done in 3 years.  She reports that they told her 5 years and she wants to do 3 years instead since she had 5 polyps.     Current Medications:  Current Outpatient Prescriptions on File Prior to Visit  Medication Sig Dispense Refill  . albuterol (PROVENTIL HFA;VENTOLIN HFA) 108 (90 BASE) MCG/ACT inhaler Inhale 2 puffs into the lungs every 6 (six) hours as needed for wheezing or shortness of breath. 1 Inhaler 2  . aspirin 81 MG tablet Take 81 mg by mouth daily.    . B Complex-C (SUPER B COMPLEX PO) Take by mouth daily.    . Cholecalciferol (VITAMIN D-3) 5000 UNITS TABS Take by mouth daily.    . Coenzyme Q10 (COQ10) 200 MG CAPS Take 200 mg by mouth daily.    . diphenhydrAMINE (BENADRYL) 25 MG tablet Take 25 mg by mouth every 6 (six) hours as needed.    Marland Kitchen EPINEPHrine (EPI-PEN) 0.3 mg/0.3 mL SOAJ injection Inject 0.3 mLs (0.3 mg total) into the  muscle once. 2 Device 2  . Magnesium 300 MG CAPS Take by mouth daily.    . Melatonin 5 MG CAPS Take 2 capsules by mouth at bedtime.     . montelukast (SINGULAIR) 10 MG tablet Take 1 tablet (10 mg total) by mouth at bedtime. 90 tablet 1  . Probiotic Product (PROBIOTIC DAILY PO) Take 1 tablet by mouth daily.    . rosuvastatin (CRESTOR) 10 MG tablet TAKE 1 TABLET (10 MG TOTAL) BY MOUTH DAILY. 30 tablet 3   No current facility-administered medications on file prior to visit.    Health Maintenance:   Immunization History  Administered Date(s) Administered  . Influenza Split 05/16/2013, 05/12/2014  . Pneumococcal  Polysaccharide-23 06/14/2011  . Tdap 03/27/2010  . Zoster 04/14/2012    Tetanus: 2011 Flu vaccine: Today Zostavax:2013 MGM: Done by Obgyn DEXA: Done by Obgyn Colonoscopy:2016 Last Dental Exam: Dr. Eliezer Bottom Last Eye Exam: Due to see them   Patient Care Team: [name], MD as PCP - General (Internal Medicine) Lavonna Monarch, MD as Consulting Physician (Dermatology) Troy Sine, MD as Consulting Physician (Cardiology) Thornell Sartorius, MD as Consulting Physician (Otolaryngology) Izora Gala, MD as Consulting Physician (Otolaryngology) Sable Feil, MD as Consulting Physician (Gastroenterology) Delila Pereyra, MD as Consulting Physician (Gynecology) Gatha Mayer, MD as Consulting Physician (Gastroenterology)  Allergies:  Allergies  Allergen Reactions  . Avelox [Moxifloxacin Hcl In Nacl] Other (See Comments)    aches  . Chantix [Varenicline]     Makes throat swell up    Medical History:  Past Medical History  Diagnosis Date  . Allergy   . Hyperlipidemia     "borderline; RX is preventative" (10/31/2014)  . Osteopenia   . Anemia   . Pre-diabetes   . Vitamin D deficiency   . Tobacco dependence   . Colon polyp   . Allergic rhinitis   . Kidney stones     "they passed"  . Family history of adverse reaction to anesthesia     "mother is hard to wake up; a little goes a long way w/her"  . Pneumonia 2003 X 1  . Chronic bronchitis ([place])     "got it q yr til I had my nose OR" (10/31/2014)  . GERD (gastroesophageal reflux disease)   . History of stomach ulcers   . Migraine     "used to get them alot; don't get them anymore" (10/31/2014)  . Seizures ([place]) ~ 1962 X 1    "from an abscessed wisdom tooth"  . Arthritis     "fingers" (10/31/2014)  . Osteoarthritis of both knees   . DDD (degenerative disc disease), cervical   . DDD (degenerative disc disease), lumbar   . Depression     "short term when my sister passed away unexpectedly"  . Acute perforated appendicitis  10/31/2014    Surgical History:  Past Surgical History  Procedure Laterality Date  . Shoulder arthroscopy w/ rotator cuff repair Left 2012  . Tendon repair Left 10/2009    torn tendon @ elbow  . Shoulder arthroscopy Right 2003    removal spurs  . Salpingoophorectomy Right 1999    "w/grapefruit-sized polyp"  . Sinus exploration  12/2013    [name]  . Vaginal hysterectomy  1978  . Laparoscopic appendectomy N/A 10/31/2014    Procedure: APPENDECTOMY LAPAROSCOPIC;  Surgeon: Alphonsa Overall, MD;  Location: [facility];  Service: General;  Laterality: N/A;  . Appendectomy    . Colonoscopy      Family History:  Family History  Problem Relation Age of Onset  . Stomach cancer Neg Hx   . Esophageal cancer Neg Hx   . Rectal cancer Neg Hx   . Colon cancer Other 46  . Colon cancer Other 79  . Diabetes Mother   . Cancer Mother     skin  . Seizures Sister   . Cancer Maternal Aunt     breast  . Cancer Paternal Aunt     breast  . Cancer Paternal Uncle     colon  . Stroke Maternal Grandmother   . Hyperlipidemia Maternal Grandfather   . Cancer Paternal Grandmother     breast  . Arthritis Paternal Grandmother     Social History:  Social History  Substance Use Topics  . Smoking status: Current Every Day Smoker -- 0.50 packs/day for 25 years    Types: Cigarettes  . Smokeless tobacco: Never Used     Comment: Pt quit smoking during pregnancies, also 3 yrs ago for 6 mos; "and several years @ times"  . Alcohol Use: No    Review of Systems: Review of Systems  Constitutional: Negative for fever, chills and diaphoresis.  HENT: Negative for congestion, ear pain and sore throat.   Eyes: Negative.   Respiratory: Negative for cough, shortness of breath and wheezing.   Cardiovascular: Negative for chest pain, palpitations and leg swelling.  Gastrointestinal: Negative for heartburn, diarrhea, constipation, blood in stool and melena.  Skin: Negative.   Neurological: Negative for headaches.     Physical Exam: Estimated body mass index is 25.96 kg/(m^2) as calculated from the following:   Height as of this encounter: '5\' 5"'$  (1.651 m).   Weight as of this encounter: 156 lb (70.761 kg). BP 116/64 mmHg  Pulse 58  Temp(Src) 98 F (36.7 C) (Temporal)  Resp 16  Ht '5\' 5"'$  (1.651 m)  Wt 156 lb (70.761 kg)  BMI 25.96 kg/m2  General Appearance: Well nourished well developed, in no apparent distress.  Eyes: PERRLA, EOMs, conjunctiva no swelling or erythema ENT/Mouth: Ear canals normal without obstruction, swelling, erythema, or discharge.  TMs normal bilaterally with no erythema, bulging, retraction, or loss of landmark.  Oropharynx moist and clear with no exudate, erythema, or swelling.   Neck: Supple, thyroid normal. No bruits.  No cervical adenopathy Respiratory: Respiratory effort normal, Breath sounds clear A&P without wheeze, rhonchi, rales.   Cardio: RRR without murmurs, rubs or gallops. Brisk peripheral pulses without edema.  Chest: symmetric, with normal excursions Breasts: Symmetric, without lumps, nipple discharge, retractions.  Abdomen: Soft, nontender, no guarding, rebound, hernias, masses, or organomegaly.  Lymphatics: Non tender without lymphadenopathy.  Genitourinary:  Musculoskeletal: Full ROM all peripheral extremities,5/5 strength, and normal gait.  Skin: Warm, dry without rashes, lesions, ecchymosis. Neuro: Awake and oriented X 3, Cranial nerves intact, reflexes equal bilaterally. Normal muscle tone, no cerebellar symptoms. Sensation intact.  Psych:  normal affect, Insight and Judgment appropriate.   EKG: WNL no changes.  AORTA SCAN: WNL   Over 40 minutes of exam, counseling, chart review and critical decision making was performed  Loma Sousa Forcucci 10:18 AM [facility]

## 2015-05-14 NOTE — Patient Instructions (Signed)
Sertraline tablets What is this medicine? SERTRALINE (SER tra leen) is used to treat depression. It may also be used to treat obsessive compulsive disorder, panic disorder, post-trauma stress, premenstrual dysphoric disorder (PMDD) or social anxiety. This medicine may be used for other purposes; ask your health care provider or pharmacist if you have questions. What should I tell my health care provider before I take this medicine? They need to know if you have any of these conditions: -bipolar disorder or a family history of bipolar disorder -diabetes -glaucoma -heart disease -high blood pressure -history of irregular heartbeat -history of low levels of calcium, magnesium, or potassium in the blood -if you often drink alcohol -liver disease -receiving electroconvulsive therapy -seizures -suicidal thoughts, plans, or attempt; a previous suicide attempt by you or a family member -thyroid disease -an unusual or allergic reaction to sertraline, other medicines, foods, dyes, or preservatives -pregnant or trying to get pregnant -breast-feeding How should I use this medicine? Take this medicine by mouth with a glass of water. Follow the directions on the prescription label. You can take it with or without food. Take your medicine at regular intervals. Do not take your medicine more often than directed. Do not stop taking this medicine suddenly except upon the advice of your doctor. Stopping this medicine too quickly may cause serious side effects or your condition may worsen. A special MedGuide will be given to you by the pharmacist with each prescription and refill. Be sure to read this information carefully each time. Talk to your pediatrician regarding the use of this medicine in children. While this drug may be prescribed for children as young as 7 years for selected conditions, precautions do apply. Overdosage: If you think you have taken too much of this medicine contact a poison control  center or emergency room at once. NOTE: This medicine is only for you. Do not share this medicine with others. What if I miss a dose? If you miss a dose, take it as soon as you can. If it is almost time for your next dose, take only that dose. Do not take double or extra doses. What may interact with this medicine? Do not take this medicine with any of the following medications: -certain medicines for fungal infections like fluconazole, itraconazole, ketoconazole, posaconazole, voriconazole -cisapride -disulfiram -dofetilide -linezolid -MAOIs like Carbex, Eldepryl, Marplan, Nardil, and Parnate -metronidazole -methylene blue (injected into a vein) -pimozide -thioridazine -ziprasidone This medicine may also interact with the following medications: -alcohol -aspirin and aspirin-like medicines -certain medicines for depression, anxiety, or psychotic disturbances -certain medicines for irregular heart beat like flecainide, propafenone -certain medicines for migraine headaches like almotriptan, eletriptan, frovatriptan, naratriptan, rizatriptan, sumatriptan, zolmitriptan -certain medicines for sleep -certain medicines for seizures like carbamazepine, valproic acid, phenytoin -certain medicines that treat or prevent blood clots like warfarin, enoxaparin, dalteparin -cimetidine -digoxin -diuretics -fentanyl -furazolidone -isoniazid -lithium -NSAIDs, medicines for pain and inflammation, like ibuprofen or naproxen -other medicines that prolong the QT interval (cause an abnormal heart rhythm) -procarbazine -rasagiline -supplements like St. John's wort, kava kava, valerian -tolbutamide -tramadol -tryptophan This list may not describe all possible interactions. Give your health care provider a list of all the medicines, herbs, non-prescription drugs, or dietary supplements you use. Also tell them if you smoke, drink alcohol, or use illegal drugs. Some items may interact with your  medicine. What should I watch for while using this medicine? Tell your doctor if your symptoms do not get better or if they get worse. Visit your doctor  or health care professional for regular checks on your progress. Because it may take several weeks to see the full effects of this medicine, it is important to continue your treatment as prescribed by your doctor. Patients and their families should watch out for new or worsening thoughts of suicide or depression. Also watch out for sudden changes in feelings such as feeling anxious, agitated, panicky, irritable, hostile, aggressive, impulsive, severely restless, overly excited and hyperactive, or not being able to sleep. If this happens, especially at the beginning of treatment or after a change in dose, call your health care professional. Dennis Bast may get drowsy or dizzy. Do not drive, use machinery, or do anything that needs mental alertness until you know how this medicine affects you. Do not stand or sit up quickly, especially if you are an older patient. This reduces the risk of dizzy or fainting spells. Alcohol may interfere with the effect of this medicine. Avoid alcoholic drinks. Your mouth may get dry. Chewing sugarless gum or sucking hard candy, and drinking plenty of water may help. Contact your doctor if the problem does not go away or is severe. What side effects may I notice from receiving this medicine? Side effects that you should report to your doctor or health care professional as soon as possible: -allergic reactions like skin rash, itching or hives, swelling of the face, lips, or tongue -black or bloody stools, blood in the urine or vomit -fast, irregular heartbeat -feeling faint or lightheaded, falls -hallucination, loss of contact with reality -seizures -suicidal thoughts or other mood changes -unusual bleeding or bruising -unusually weak or tired -vomiting Side effects that usually do not require medical attention (report to your  doctor or health care professional if they continue or are bothersome): -change in appetite -change in sex drive or performance -diarrhea -increased sweating -indigestion, nausea -tremors This list may not describe all possible side effects. Call your doctor for medical advice about side effects. You may report side effects to FDA at 1-800-FDA-1088. Where should I keep my medicine? Keep out of the reach of children. Store at room temperature between 15 and 30 degrees C (59 and 86 degrees F). Throw away any unused medicine after the expiration date. NOTE: This sheet is a summary. It may not cover all possible information. If you have questions about this medicine, talk to your doctor, pharmacist, or health care provider.    2016, [employer] Standard. (2013-02-08 12:57:35)

## 2015-05-15 LAB — URINALYSIS, ROUTINE W REFLEX MICROSCOPIC
Bilirubin Urine: NEGATIVE
Glucose, UA: NEGATIVE
Hgb urine dipstick: NEGATIVE
Ketones, ur: NEGATIVE
Leukocytes, UA: NEGATIVE
Nitrite: NEGATIVE
Protein, ur: NEGATIVE
Specific Gravity, Urine: 1.005 (ref 1.001–1.035)
pH: 6 (ref 5.0–8.0)

## 2015-05-15 LAB — VITAMIN B12: Vitamin B-12: 710 pg/mL (ref 211–911)

## 2015-05-15 LAB — MICROALBUMIN / CREATININE URINE RATIO
Creatinine, Urine: 15 mg/dL — ABNORMAL LOW (ref 20–320)
Microalb, Ur: <0.2 mg/dL

## 2015-05-15 LAB — INSULIN, RANDOM: Insulin: 4.8 u[IU]/mL (ref 2.0–19.6)

## 2015-05-15 LAB — VITAMIN D 25 HYDROXY (VIT D DEFICIENCY, FRACTURES): Vit D, 25-Hydroxy: 47 ng/mL (ref 30–100)

## 2015-05-15 LAB — TSH: TSH: 1.217 u[IU]/mL (ref 0.350–4.500)

## 2015-06-06 ENCOUNTER — Encounter: Payer: Self-pay | Admitting: Physician Assistant

## 2015-06-06 ENCOUNTER — Ambulatory Visit (INDEPENDENT_AMBULATORY_CARE_PROVIDER_SITE_OTHER): Payer: 59 | Admitting: Physician Assistant

## 2015-06-06 VITALS — BP 110/70 | HR 63 | Temp 97.9°F | Resp 16 | Ht 65.0 in | Wt 156.0 lb

## 2015-06-06 DIAGNOSIS — J320 Chronic maxillary sinusitis: Secondary | ICD-10-CM | POA: Diagnosis not present

## 2015-06-06 MED ORDER — PREDNISONE 20 MG PO TABS: ORAL_TABLET | ORAL | Status: DC

## 2015-06-06 MED ORDER — MOXIFLOXACIN HCL 400 MG PO TABS: 400.0000 mg | ORAL_TABLET | Freq: Every day | ORAL | Status: AC

## 2015-06-06 MED ORDER — PROMETHAZINE-CODEINE 6.25-10 MG/5ML PO SYRP
5.0000 mL | ORAL_SOLUTION | Freq: Four times a day (QID) | ORAL | Status: DC | PRN
Start: 2015-06-06 — End: 2015-06-25

## 2015-06-06 NOTE — Patient Instructions (Signed)
Sinusitis can be uncomfortable. People with sinusitis have congestion with yellow/green/gray discharge, sinus pain/pressure, pain around the eyes. Sinus infections almost ALWAYS stem from a viral infection and antibiotics don't work against a virus. Even when bacteria is responsible, the infections usually clear up on their own in a week or so.   PLEASE TRY TO DO OVER THE COUNTER TREATMENT AND PREDNISONE FOR 5-7 DAYS AND IF YOU ARE NOT GETTING BETTER OR GETTING WORSE THEN YOU CAN START ON AN ANTIBIOTIC GIVEN.  Can take the prednisone AT NIGHT WITH DINNER, it take 8-12 hours to start working so it will NOT affect your sleeping if you take it at night with your food!! Take two pills the first night and 1 or two pill the second night and then 1 pill the other nights.   Risk of antibiotic use: About 1 in 4 people who take antibiotics have side effects including stomach problems, dizziness, or rashes. Those problems clear up soon after stopping the drugs, but in rare cases antibiotics can cause severe allergic reaction. Over use of antibiotics also encourages the growth of bacteria that can't be controlled easily with drugs. That makes you more vunerable to antibiotic-resistant infections and undermines the benefits of antibiotics for others.   Waste of Money: Antibiotics often aren't very expensive, but any money spent on unnecessary drugs is money down the drain.   When are antibiotics needed? Only when symptoms last longer than a week.  Start to improve but then worsen again  -It can take up to 2 weeks to feel better.   -If you do not get better in 7-10 days (Have fever, facial pain, dental pain and swelling), then please call the office and it is now appropriate to start an antibiotic.   -Please take Tylenol or Ibuprofen for pain. -Acetaminiphen 325mg orally every 4-6 hours for pain.  Max: 10 per day -Ibuprofen 200mg orally every 6-8 hours for pain.  Take with food to avoid ulcers.   Max 10 per  day  Please pick one of the over the counter allergy medications below and take it once daily for allergies.  Claritin or loratadine cheapest but likely the weakest  Zyrtec or certizine at night because it can make you sleepy The strongest is allegra or fexafinadine  Cheapest at [facility], [employer], [employer]  -While drinking fluids, pinch and hold nose close and swallow.  This will help open up your eustachian tubes to drain the fluid behind your ear drums. -Try steam showers to open your nasal passages.   Drink lots of water to stay hydrated and to thin mucous.  Flonase/Nasonex is to help the inflammation.  Take 2 sprays in each nostril at bedtime.  Make sure you spray towards the outside of each nostril towards the outer corner of your eye, hold nose close and tilt head back.  This will help the medication get into your sinuses.  If you do not like this medication, then use saline nasal sprays same directions as above for Flonase. Stop the medication right away if you get blurring of your vision or nose bleeds.  Sinusitis Sinusitis is redness, soreness, and inflammation of the paranasal sinuses. Paranasal sinuses are air pockets within the bones of your face (beneath the eyes, the middle of the forehead, or above the eyes). In healthy paranasal sinuses, mucus is able to drain out, and air is able to circulate through them by way of your nose. However, when your paranasal sinuses are inflamed, mucus and air can   become trapped. This can allow bacteria and other germs to grow and cause infection. Sinusitis can develop quickly and last only a short time (acute) or continue over a long period (chronic). Sinusitis that lasts for more than 12 weeks is considered chronic.  CAUSES  Causes of sinusitis include: Allergies. Structural abnormalities, such as displacement of the cartilage that separates your nostrils (deviated septum), which can decrease the air flow through your nose and sinuses and affect sinus  drainage. Functional abnormalities, such as when the small hairs (cilia) that line your sinuses and help remove mucus do not work properly or are not present. SIGNS AND SYMPTOMS  Symptoms of acute and chronic sinusitis are the same. The primary symptoms are pain and pressure around the affected sinuses. Other symptoms include: Upper toothache. Earache. Headache. Bad breath. Decreased sense of smell and taste. A cough, which worsens when you are lying flat. Fatigue. Fever. Thick drainage from your nose, which often is green and may contain pus (purulent). Swelling and warmth over the affected sinuses. DIAGNOSIS  Your health care provider will perform a physical exam. During the exam, your health care provider may: Look in your nose for signs of abnormal growths in your nostrils (nasal polyps).  Tap over the affected sinus to check for signs of infection. View the inside of your sinuses (endoscopy) using an imaging device that has a light attached (endoscope). If your health care provider suspects that you have chronic sinusitis, one or more of the following tests may be recommended: Allergy tests. Nasal culture. A sample of mucus is taken from your nose, sent to a lab, and screened for bacteria. Nasal cytology. A sample of mucus is taken from your nose and examined by your health care provider to determine if your sinusitis is related to an allergy. TREATMENT  Most cases of acute sinusitis are related to a viral infection and will resolve on their own within 10 days. Sometimes medicines are prescribed to help relieve symptoms (pain medicine, decongestants, nasal steroid sprays, or saline sprays).  However, for sinusitis related to a bacterial infection, your health care provider will prescribe antibiotic medicines. These are medicines that will help kill the bacteria causing the infection.  Rarely, sinusitis is caused by a fungal infection. In theses cases, your health care provider will  prescribe antifungal medicine. For some cases of chronic sinusitis, surgery is needed. Generally, these are cases in which sinusitis recurs more than 3 times per year, despite other treatments. HOME CARE INSTRUCTIONS  Drink plenty of water. Water helps thin the mucus so your sinuses can drain more easily. Use a humidifier. Inhale steam 3 to 4 times a day (for example, sit in the bathroom with the shower running). Apply a warm, moist washcloth to your face 3 to 4 times a day, or as directed by your health care provider. Use saline nasal sprays to help moisten and clean your sinuses. Take medicines only as directed by your health care provider. If you were prescribed either an antibiotic or antifungal medicine, finish it all even if you start to feel better. SEEK IMMEDIATE MEDICAL CARE IF: You have increasing pain or severe headaches. You have nausea, vomiting, or drowsiness. You have swelling around your face. You have vision problems. You have a stiff neck. You have difficulty breathing. MAKE SURE YOU:  Understand these instructions. Will watch your condition. Will get help right away if you are not doing well or get worse. Document Released: 07/14/2005 Document Revised: 11/28/2013 Document Reviewed: 07/29/2011 Exit[employer]   Patient Information 2015 [employer]. This information is not intended to replace advice given to you by your health care provider. Make sure you discuss any questions you have with your health care provider.   

## 2015-06-06 NOTE — Progress Notes (Signed)
Subjective:    Patient ID: Karen Barrera, female    DOB: 20-Aug-1950, 64 y.o.   MRN: [id number]  HPI 64 y.o. smoking WF with history of COPD, allergies with sinus pain x 3 days. Has had left sided sinus pain, nasal bleed, teeth pain, sore throat, no fever or chills. Has been on allergy pill.   Blood pressure 110/70, pulse 63, temperature 97.9 F (36.6 C), temperature source Temporal, resp. rate 16, height '5\' 5"'$  (1.651 m), weight 156 lb (70.761 kg), SpO2 96 %.  Current Outpatient Prescriptions on File Prior to Visit  Medication Sig Dispense Refill  . albuterol (PROVENTIL HFA;VENTOLIN HFA) 108 (90 BASE) MCG/ACT inhaler Inhale 2 puffs into the lungs every 6 (six) hours as needed for wheezing or shortness of breath. 3 Inhaler 2  . aspirin 81 MG tablet Take 81 mg by mouth daily.    . B Complex-C (SUPER B COMPLEX PO) Take by mouth daily.    . Cholecalciferol (VITAMIN D-3) 5000 UNITS TABS Take by mouth daily.    . Coenzyme Q10 (COQ10) 200 MG CAPS Take 200 mg by mouth daily.    . diphenhydrAMINE (BENADRYL) 25 MG tablet Take 25 mg by mouth every 6 (six) hours as needed.    Marland Kitchen EPINEPHrine (EPI-PEN) 0.3 mg/0.3 mL SOAJ injection Inject 0.3 mLs (0.3 mg total) into the muscle once. 2 Device 2  . Magnesium 300 MG CAPS Take by mouth daily.    . Melatonin 5 MG CAPS Take 2 capsules by mouth at bedtime.     . montelukast (SINGULAIR) 10 MG tablet Take 1 tablet (10 mg total) by mouth at bedtime. 90 tablet 1  . Probiotic Product (PROBIOTIC DAILY PO) Take 1 tablet by mouth daily.    . rosuvastatin (CRESTOR) 10 MG tablet TAKE 1 TABLET (10 MG TOTAL) BY MOUTH DAILY. 90 tablet 3  . sertraline (ZOLOFT) 50 MG tablet Please take 1/2 tablet daily with food x 1 week.  If no side effects increase to 1 tablet daily. 90 tablet 0   No current facility-administered medications on file prior to visit.   Past Medical History  Diagnosis Date  . Allergy   . Hyperlipidemia     "borderline; RX is preventative" (10/31/2014)  .  Osteopenia   . Anemia   . Pre-diabetes   . Vitamin D deficiency   . Tobacco dependence   . Colon polyp   . Allergic rhinitis   . Kidney stones     "they passed"  . Family history of adverse reaction to anesthesia     "mother is hard to wake up; a little goes a long way w/her"  . Pneumonia 2003 X 1  . Chronic bronchitis ([place])     "got it q yr til I had my nose OR" (10/31/2014)  . GERD (gastroesophageal reflux disease)   . History of stomach ulcers   . Migraine     "used to get them alot; don't get them anymore" (10/31/2014)  . Seizures ([place]) ~ 1962 X 1    "from an abscessed wisdom tooth"  . Arthritis     "fingers" (10/31/2014)  . Osteoarthritis of both knees   . DDD (degenerative disc disease), cervical   . DDD (degenerative disc disease), lumbar   . Depression     "short term when my sister passed away unexpectedly"  . Acute perforated appendicitis 10/31/2014   Review of Systems  Constitutional: Negative for chills and diaphoresis.  HENT: Positive for congestion, postnasal drip, sinus pressure  and sneezing. Negative for ear pain and sore throat.   Respiratory: Positive for cough. Negative for chest tightness, shortness of breath and wheezing.   Cardiovascular: Negative.   Gastrointestinal: Negative.   Genitourinary: Negative.   Musculoskeletal: Negative for neck pain.  Neurological: Positive for headaches.       Objective:   Physical Exam  Constitutional: She appears well-developed and well-nourished.  HENT:  Head: Normocephalic and atraumatic.  Right Ear: External ear normal.  Nose: Right sinus exhibits maxillary sinus tenderness. Right sinus exhibits no frontal sinus tenderness. Left sinus exhibits maxillary sinus tenderness. Left sinus exhibits no frontal sinus tenderness.  Eyes: Conjunctivae and EOM are normal.  Neck: Normal range of motion. Neck supple.  Cardiovascular: Normal rate, regular rhythm, normal heart sounds and intact distal pulses.   Pulmonary/Chest: Effort  normal and breath sounds normal. No respiratory distress. She has no wheezes.  Abdominal: Soft. Bowel sounds are normal.  Lymphadenopathy:    She has cervical adenopathy.  Skin: Skin is warm and dry.      Assessment & Plan:  1. Chronic maxillary sinusitis Will hold the avelox and take if she is not getting better, increase fluids, rest, cont allergy pill - moxifloxacin (AVELOX) 400 MG tablet; Take 1 tablet (400 mg total) by mouth daily.  Dispense: 10 tablet; Refill: 0 - predniSONE (DELTASONE) 20 MG tablet; 2 tablets daily for 3 days, 1 tablet daily for 4 days.  Dispense: 10 tablet; Refill: 0 - promethazine-codeine (PHENERGAN WITH CODEINE) 6.25-10 MG/5ML syrup; Take 5 mLs by mouth every 6 (six) hours as needed for cough. Max: 49m per day  Dispense: 240 mL; Refill: 0

## 2015-06-13 ENCOUNTER — Ambulatory Visit (INDEPENDENT_AMBULATORY_CARE_PROVIDER_SITE_OTHER): Payer: 59 | Admitting: Internal Medicine

## 2015-06-13 ENCOUNTER — Encounter: Payer: Self-pay | Admitting: Internal Medicine

## 2015-06-13 VITALS — BP 124/80 | HR 66 | Ht 64.0 in | Wt 159.2 lb

## 2015-06-13 DIAGNOSIS — Z720 Tobacco use: Secondary | ICD-10-CM

## 2015-06-13 DIAGNOSIS — F17200 Nicotine dependence, unspecified, uncomplicated: Secondary | ICD-10-CM | POA: Diagnosis not present

## 2015-06-13 DIAGNOSIS — R0600 Dyspnea, unspecified: Secondary | ICD-10-CM | POA: Diagnosis not present

## 2015-06-13 DIAGNOSIS — J0100 Acute maxillary sinusitis, unspecified: Secondary | ICD-10-CM

## 2015-06-13 NOTE — Patient Instructions (Signed)
Office spirometry-  Tobacco user  Finish the Avelox as directed  Please call as needed

## 2015-06-13 NOTE — Progress Notes (Signed)
06/16/13- 50 yoF smoker referred courtesy of Dr McKeown/  Kelby Aline, PA for chronic allergies.  Pt c/o congestion, intermittent sometimes prod cough with clear/yellow mucus. She describes "allergy" as rhinitis and chest congestion each year around Christmas and Easter( winter and spring). History of seasonal allergic rhinitis with itching and some sneezing each spring and fall. Denies history of asthma. Needed to see her primary physician 4 times in September. Eyes have been watering, nose running, intermittent cough sometimes productive of white or yellow sputum. Occasional mild wheeze. History of deviated septum repair.  She says when she has tried quitting smoking before she gets mood changes and weight gain. Now averaging one half pack per day. History of pneumonia with pneumonia vaccine twice. Rescue inhaler has been some help if acutely ill. She denies reflux symptoms. Family history of uncle and grand daughter with asthma. She is married, retired from an office job with some travel. CXR 04/20/12 IMPRESSION:  Stable exam. No active cardiopulmonary disease.  Original Report Authenticated By: Marlaine Hind, M.D.   08/01/13- 21 yo F smoker followed for allergic rhinitis, recurrent acute bronchitis, tobacco use. FOLLOWS FOR: has not had her 6MW test yet; review labs and cxr with patient; slight wheezing at times. Occasional wheeze lying down. Nasal airway has been clear. Expects spring and fall exacerbations Allergy Profile 06/16/13 Total IgE 41.7   Pos dust mite, dog, [place] grass, ragweed CXR 06/16/13 IMPRESSION:  No active cardiopulmonary disease.  Electronically Signed  By: Kathreen Devoid  On: 06/16/2013 16:17  10/31/13- 69 yo F smoker followed for allergic rhinitis, recurrent acute bronchitis, tobacco use FOLLOWS FOR: Breathing is unchanged since last OV. Having some issues with allergies due to the pollen. Blames pollen for nasal congestion, mild bitemporal headache and dry throat with  stuffy nose and postnasal drip. PFT 10/31/2013-within normal limits with no response to bronchodilator. FVC 3.91/120%, FEV1 3.08/123%, FEV1/FEC 0.79, FEF 25-75% 2.90/129%. TLC 113%, DLCO 86%. 6 minute walk test 10/31/2013-94%, 97%, 97%, 480 m. Normal oxygenation.  06/13/15- 64 year old female smoker followed for allergic rhinitis, recurrent acute bronchitis, tobacco use FOLLOWS FOR: Currently being treated for sinus infection-started Avelox this morning and completed round of prednisone yesterday; also taking allegra and Flonase as well. Pt denies any trouble with her breathing at this time.  Chest feels clear. Office Spirometry 06/13/2015-within normal limits. FVC 3.55/117%, FEV1 2.64/111%, FEV1/FVC 0.74 CXR 03/23/15 IMPRESSION: 1. Hyperinflation a mild bronchitic changes. 2. No focal acute pulmonary abnormality. Electronically Signed  By: Nolon Nations M.D.  On: 03/23/2015 12:46  ROS-see HPI Constitutional:   No-   weight loss, night sweats, fevers, chills, fatigue, lassitude. HEENT:   +headaches, difficulty swallowing, tooth/dental problems, sore throat,       sneezing,no- itching, ear ache, +nasal congestion, +post nasal drip,  CV:  No-   chest pain, orthopnea, PND, swelling in lower extremities, anasarca,  dizziness, palpitations Resp: No-   shortness of breath with exertion or at rest.             productive cough,  No-non-productive cough,  No- coughing up of blood.              No-   change in color of mucus.  No- wheezing Skin: No-   rash or lesions. GI:  No-   heartburn, indigestion, abdominal pain, nausea, vomiting,  GU:  MS:  No-   joint pain or swelling.   Neuro-     nothing unusual Psych:  No- change in mood or affect. No  depression or anxiety.  No memory loss.  OBJ- Physical Exam General- Alert, Oriented, Affect-appropriate, Distress- none acute.+odor of tobacco Skin- rash-none, lesions- none, excoriation- none Lymphadenopathy- none Head- atraumatic             Eyes- Gross vision intact, PERRLA, conjunctivae and secretions clear            Ears- Hearing, canals-normal            Nose- Clear, no-Septal dev, mucus, polyps, erosion, perforation             Throat- Mallampati II , mucosa clear , drainage- none, tonsils- atrophic Neck- flexible , trachea midline, no stridor , thyroid nl, carotid no bruit Chest - symmetrical excursion , unlabored           Heart/CV- RRR , no murmur , no gallop  , no rub, nl s1 s2                           - JVD- none , edema- none, stasis changes- none, varices- none           Lung- clear to P&A, wheeze + slight, cough- none , dullness-none, rub- none           Chest wall-  Abd-  Br/ Gen/ Rectal- Not done, not indicated Extrem- cyanosis- none, clubbing, none, atrophy- none, strength- nl Neuro- grossly intact to observation

## 2015-06-19 ENCOUNTER — Encounter: Payer: Self-pay | Admitting: Internal Medicine

## 2015-06-19 DIAGNOSIS — J0100 Acute maxillary sinusitis, unspecified: Secondary | ICD-10-CM | POA: Insufficient documentation

## 2015-06-19 NOTE — Assessment & Plan Note (Signed)
Treated by primary physician with Avelox and prednisone. Successful therapy anticipated. Plan-finish prescribed meds. Quit smoking.

## 2015-06-19 NOTE — Assessment & Plan Note (Signed)
PFTs fortunate remained good but I emphasized importance of real smoking cessation effort.

## 2015-06-19 NOTE — Assessment & Plan Note (Signed)
PFTs are normal but I hear trace wheeze and chest x-ray looks overinflated with mild bronchitic change. This is consistent with smoking history. Plan-emphasis on smoking cessation

## 2015-06-25 ENCOUNTER — Ambulatory Visit (INDEPENDENT_AMBULATORY_CARE_PROVIDER_SITE_OTHER): Payer: 59 | Admitting: Internal Medicine

## 2015-06-25 ENCOUNTER — Encounter: Payer: Self-pay | Admitting: Internal Medicine

## 2015-06-25 VITALS — BP 106/64 | HR 78 | Temp 98.2°F | Resp 18 | Ht 65.0 in | Wt 158.0 lb

## 2015-06-25 DIAGNOSIS — N951 Menopausal and female climacteric states: Secondary | ICD-10-CM

## 2015-06-25 DIAGNOSIS — F411 Generalized anxiety disorder: Secondary | ICD-10-CM | POA: Diagnosis not present

## 2015-06-25 MED ORDER — ALPRAZOLAM 0.25 MG PO TABS: 0.2500 mg | ORAL_TABLET | Freq: Two times a day (BID) | ORAL | Status: DC | PRN

## 2015-06-25 MED ORDER — SERTRALINE HCL 100 MG PO TABS: 100.0000 mg | ORAL_TABLET | Freq: Every day | ORAL | Status: DC

## 2015-06-25 NOTE — Patient Instructions (Signed)
Alprazolam tablets What is this medicine? ALPRAZOLAM (al PRAY zoe lam) is a benzodiazepine. It is used to treat anxiety and panic attacks. This medicine may be used for other purposes; ask your health care provider or pharmacist if you have questions. What should I tell my health care provider before I take this medicine? They need to know if you have any of these conditions: -an alcohol or drug abuse problem -bipolar disorder, depression, psychosis or other mental health conditions -glaucoma -kidney or liver disease -lung or breathing disease -myasthenia gravis -Parkinson's disease -porphyria -seizures or a history of seizures -suicidal thoughts -an unusual or allergic reaction to alprazolam, other benzodiazepines, foods, dyes, or preservatives -pregnant or trying to get pregnant -breast-feeding How should I use this medicine? Take this medicine by mouth with a glass of water. Follow the directions on the prescription label. Take your medicine at regular intervals. Do not take it more often than directed. If you have been taking this medicine regularly for some time, do not suddenly stop taking it. You must gradually reduce the dose or you may get severe side effects. Ask your doctor or health care professional for advice. Even after you stop taking this medicine it can still affect your body for several days. Talk to your pediatrician regarding the use of this medicine in children. Special care may be needed. Overdosage: If you think you have taken too much of this medicine contact a poison control center or emergency room at once. NOTE: This medicine is only for you. Do not share this medicine with others. What if I miss a dose? If you miss a dose, take it as soon as you can. If it is almost time for your next dose, take only that dose. Do not take double or extra doses. What may interact with this medicine? Do not take this medicine with any of the following medications: -certain  medicines for HIV infection or AIDS -ketoconazole -itraconazole This medicine may also interact with the following medications: -birth control pills -certain macrolide antibiotics like clarithromycin, erythromycin, troleandomycin -cimetidine -cyclosporine -ergotamine -grapefruit juice -herbal or dietary supplements like kava kava, melatonin, dehydroepiandrosterone, DHEA, St. John's Wort or valerian -imatinib, STI-571 -isoniazid -levodopa -medicines for depression, anxiety, or psychotic disturbances -prescription pain medicines -rifampin, rifapentine, or rifabutin -some medicines for blood pressure or heart problems -some medicines for seizures like carbamazepine, oxcarbazepine, phenobarbital, phenytoin, primidone This list may not describe all possible interactions. Give your health care provider a list of all the medicines, herbs, non-prescription drugs, or dietary supplements you use. Also tell them if you smoke, drink alcohol, or use illegal drugs. Some items may interact with your medicine. What should I watch for while using this medicine? Visit your doctor or health care professional for regular checks on your progress. Your body can become dependent on this medicine. Ask your doctor or health care professional if you still need to take it. You may get drowsy or dizzy. Do not drive, use machinery, or do anything that needs mental alertness until you know how this medicine affects you. To reduce the risk of dizzy and fainting spells, do not stand or sit up quickly, especially if you are an older patient. Alcohol may increase dizziness and drowsiness. Avoid alcoholic drinks. Do not treat yourself for coughs, colds or allergies without asking your doctor or health care professional for advice. Some ingredients can increase possible side effects. What side effects may I notice from receiving this medicine? Side effects that you should report to your  doctor or health care professional as  soon as possible: -allergic reactions like skin rash, itching or hives, swelling of the face, lips, or tongue -confusion, forgetfulness -depression -difficulty sleeping -difficulty speaking -feeling faint or lightheaded, falls -mood changes, excitability or aggressive behavior -muscle cramps -trouble passing urine or change in the amount of urine -unusually weak or tired Side effects that usually do not require medical attention (report to your doctor or health care professional if they continue or are bothersome): -change in sex drive or performance -changes in appetite This list may not describe all possible side effects. Call your doctor for medical advice about side effects. You may report side effects to FDA at 1-800-FDA-1088. Where should I keep my medicine? Keep out of the reach of children. This medicine can be abused. Keep your medicine in a safe place to protect it from theft. Do not share this medicine with anyone. Selling or giving away this medicine is dangerous and against the law. Store at room temperature between 20 and 25 degrees C (68 and 77 degrees F). This medicine may cause accidental overdose and death if taken by other adults, children, or pets. Mix any unused medicine with a substance like cat litter or coffee grounds. Then throw the medicine away in a sealed container like a sealed bag or a coffee can with a lid. Do not use the medicine after the expiration date. NOTE: This sheet is a summary. It may not cover all possible information. If you have questions about this medicine, talk to your doctor, pharmacist, or health care provider.    2016, [employer] Standard. (2014-04-04 14:51:36)

## 2015-06-25 NOTE — Progress Notes (Signed)
Patient ID: Karen Barrera, female   DOB: 1950-10-13, 64 y.o.   MRN: [id number]  Assessment and Plan:   1. Hot flash, menopausal -increase zoloft to 100 mg  2. Generalized anxiety disorder -increase zoloft to 100 mg -xanax BID prn     HPI 64 y.o.female presents for 1.5 month follow up of anxiety, hot flashes after starting zoloft . Patient reports that they have been doing well, but she reports that she is still having a lot of hot flashes and she is still anxious. She doesn't feel much different female is taking their medication.  They are not having difficulty with their medications.  They report no adverse reactions.    Past Medical History  Diagnosis Date  . Allergy   . Hyperlipidemia     "borderline; RX is preventative" (10/31/2014)  . Osteopenia   . Anemia   . Pre-diabetes   . Vitamin D deficiency   . Tobacco dependence   . Colon polyp   . Allergic rhinitis   . Kidney stones     "they passed"  . Family history of adverse reaction to anesthesia     "mother is hard to wake up; a little goes a long way w/her"  . Pneumonia 2003 X 1  . Chronic bronchitis ([place])     "got it q yr til I had my nose OR" (10/31/2014)  . GERD (gastroesophageal reflux disease)   . History of stomach ulcers   . Migraine     "used to get them alot; don't get them anymore" (10/31/2014)  . Seizures ([place]) ~ 1962 X 1    "from an abscessed wisdom tooth"  . Arthritis     "fingers" (10/31/2014)  . Osteoarthritis of both knees   . DDD (degenerative disc disease), cervical   . DDD (degenerative disc disease), lumbar   . Depression     "short term when my sister passed away unexpectedly"  . Acute perforated appendicitis 10/31/2014     Allergies  Allergen Reactions  . Chantix [Varenicline]     Makes throat swell up      Current Outpatient Prescriptions on File Prior to Visit  Medication Sig Dispense Refill  . albuterol (PROVENTIL HFA;VENTOLIN HFA) 108 (90 BASE) MCG/ACT inhaler Inhale 2 puffs into the  lungs every 6 (six) hours as needed for wheezing or shortness of breath. 3 Inhaler 2  . aspirin 81 MG tablet Take 81 mg by mouth daily.    . B Complex-C (SUPER B COMPLEX PO) Take by mouth daily.    . Cholecalciferol (VITAMIN D-3) 5000 UNITS TABS Take by mouth daily.    . Coenzyme Q10 (COQ10) 200 MG CAPS Take 200 mg by mouth daily.    . diphenhydrAMINE (BENADRYL) 25 MG tablet Take 25 mg by mouth every 6 (six) hours as needed.    Marland Kitchen EPINEPHrine (EPI-PEN) 0.3 mg/0.3 mL SOAJ injection Inject 0.3 mLs (0.3 mg total) into the muscle once. 2 Device 2  . fexofenadine (ALLEGRA) 180 MG tablet Take 180 mg by mouth daily.    . fluticasone (FLONASE) 50 MCG/ACT nasal spray Place 1 spray into both nostrils daily.    . Magnesium 300 MG CAPS Take by mouth daily.    . Melatonin 5 MG CAPS Take 2 capsules by mouth at bedtime.     . montelukast (SINGULAIR) 10 MG tablet Take 1 tablet (10 mg total) by mouth at bedtime. 90 tablet 1  . Probiotic Product (PROBIOTIC DAILY PO) Take 1 tablet by mouth daily.    Marland Kitchen  rosuvastatin (CRESTOR) 10 MG tablet TAKE 1 TABLET (10 MG TOTAL) BY MOUTH DAILY. 90 tablet 3  . sertraline (ZOLOFT) 50 MG tablet Please take 1/2 tablet daily with food x 1 week.  If no side effects increase to 1 tablet daily. 90 tablet 0   No current facility-administered medications on file prior to visit.    ROS: all negative except above.   Physical Exam: Filed Weights   06/25/15 1137  Weight: 158 lb (71.668 kg)   BP 106/64 mmHg  Pulse 78  Temp(Src) 98.2 F (36.8 C) (Temporal)  Resp 18  Ht '5\' 5"'$  (1.651 m)  Wt 158 lb (71.668 kg)  BMI 26.29 kg/m2 General Appearance: Well developed well nourished, non-toxic appearing in no apparent distress. Eyes: PERRLA, EOMs, conjunctiva w/ no swelling or erythema or discharge Sinuses: No Frontal/maxillary tenderness ENT/Mouth: Ear canals clear without swelling or erythema.  TM's normal bilaterally with no retractions, bulging, or loss of landmarks.   Neck: Supple,  thyroid normal, no notable JVD  Respiratory: Respiratory effort normal, Clear breath sounds anteriorly and posteriorly bilaterally without rales, rhonchi, wheezing or stridor. No retractions or accessory muscle usage. Cardio: RRR with no MRGs.   Abdomen: Soft, + BS.  Non tender, no guarding, rebound, hernias, masses.  Musculoskeletal: Full ROM, 5/5 strength, normal gait.  Skin: Warm, dry without rashes  Neuro: Awake and oriented X 3, Cranial nerves intact. Normal muscle tone, no cerebellar symptoms. Sensation intact.  Psych: normal affect, Insight and Judgment appropriate.     Starlyn Skeans, PA-C 11:47 AM [facility]

## 2015-09-05 ENCOUNTER — Ambulatory Visit (INDEPENDENT_AMBULATORY_CARE_PROVIDER_SITE_OTHER): Payer: 59 | Admitting: Physician Assistant

## 2015-09-05 ENCOUNTER — Encounter: Payer: Self-pay | Admitting: Physician Assistant

## 2015-09-05 VITALS — BP 100/70 | HR 80 | Temp 98.1°F | Resp 16 | Ht 65.0 in | Wt 164.4 lb

## 2015-09-05 DIAGNOSIS — R05 Cough: Secondary | ICD-10-CM

## 2015-09-05 DIAGNOSIS — R059 Cough, unspecified: Secondary | ICD-10-CM

## 2015-09-05 MED ORDER — PREDNISONE 20 MG PO TABS: ORAL_TABLET | ORAL | Status: AC

## 2015-09-05 MED ORDER — AZELASTINE HCL 0.1 % NA SOLN: 2.0000 | Freq: Two times a day (BID) | NASAL | Status: DC

## 2015-09-05 MED ORDER — AZITHROMYCIN 250 MG PO TABS: ORAL_TABLET | ORAL | Status: AC

## 2015-09-05 NOTE — Progress Notes (Signed)
   Subjective:    Patient ID: Karen Barrera, female    DOB: [birth year], 65 y.o.   MRN: [id number]  HPI 65 y.o. WF has had PND x 2 weeks, then woke up yesterday with ears popping, clicking, hoarseness, dry cough. Denies SOB, fever, chills.   Blood pressure 100/70, pulse 80, temperature 98.1 F (36.7 C), resp. rate 16, height '5\' 5"'$  (1.651 m), weight 164 lb 6.4 oz (74.571 kg), SpO2 98 %.  Review of Systems  Constitutional: Negative for chills and diaphoresis.  HENT: Positive for congestion, postnasal drip, sinus pressure and sneezing. Negative for ear pain and sore throat.   Respiratory: Positive for cough. Negative for chest tightness, shortness of breath and wheezing.   Cardiovascular: Negative.   Gastrointestinal: Negative.   Genitourinary: Negative.   Musculoskeletal: Negative for neck pain.  Neurological: Negative for headaches.       Objective:   Physical Exam  Constitutional: She is oriented to person, place, and time. She appears well-developed and well-nourished.  HENT:  Right Ear: Hearing and external ear normal. No mastoid tenderness. Tympanic membrane is injected. Tympanic membrane is not perforated, not erythematous, not retracted and not bulging. A middle ear effusion is present.  Left Ear: Hearing and external ear normal. No mastoid tenderness. Tympanic membrane is injected. Tympanic membrane is not perforated, not erythematous, not retracted and not bulging. A middle ear effusion is present.  Nose: Right sinus exhibits maxillary sinus tenderness. Left sinus exhibits maxillary sinus tenderness.  Mouth/Throat: Uvula is midline, oropharynx is clear and moist and mucous membranes are normal.  Eyes: Conjunctivae and EOM are normal. Pupils are equal, round, and reactive to light.  Neck: Neck supple.  Cardiovascular: Normal rate and regular rhythm.   Pulmonary/Chest: Effort normal and breath sounds normal. No respiratory distress. She has no wheezes.  Abdominal: Soft. Bowel  sounds are normal.  Musculoskeletal: Normal range of motion.  Lymphadenopathy:    She has no cervical adenopathy.  Neurological: She is alert and oriented to person, place, and time.  Skin: Skin is warm and dry.       Assessment & Plan:  1. Cough Will hold the zpak and take if she is not getting better, increase fluids, rest, cont allergy pill - predniSONE (DELTASONE) 20 MG tablet; 2 tablets daily for 3 days, 1 tablet daily for 4 days.  Dispense: 10 tablet; Refill: 0 - azithromycin (ZITHROMAX) 250 MG tablet; Take 2 tablets (500 mg) on  Day 1,  followed by 1 tablet (250 mg) once daily on Days 2 through 5.  Dispense: 6 each; Refill: 1 - azelastine (ASTELIN) 0.1 % nasal spray; Place 2 sprays into both nostrils 2 (two) times daily. Use in each nostril as directed  Dispense: 30 mL; Refill: 2

## 2015-09-05 NOTE — Patient Instructions (Signed)
Tumeric with black pepper extract is a great natural antiinflammatory that helps with arthritis and aches and pain. Can get from [employer] or any health food store. Need to take at least '800mg'$  twice a day with food.   HOW TO TREAT VIRAL COUGH AND COLD SYMPTOMS:  -Symptoms usually last at least 1 week with the worst symptoms being around day 4.  - colds usually start with a sore throat and end with a cough, and the cough can take 2 weeks to get better.  -No antibiotics are needed for colds, flu, sore throats, cough, bronchitis UNLESS symptoms are longer than 7 days OR if you are getting better then get drastically worse.  -There are a lot of combination medications (Dayquil, Nyquil, Vicks 44, tyelnol cold and sinus, ETC). Please look at the ingredients on the back so that you are treating the correct symptoms and not doubling up on medications/ingredients.    Medicines you can use  Nasal congestion  - pseudoephedrine (Sudafed)- behind the counter, do not use if you have high blood pressure, medicine that have -D in them.  - phenylephrine (Sudafed PE) -Dextormethorphan + chlorpheniramine (Coridcidin HBP)- okay if you have high blood pressure -Oxymetazoline (Afrin) nasal spray- LIMIT to 3 days -Saline nasal spray -Neti pot (used distilled or bottled water)  Ear pain/congestion  -pseudoephedrine (sudafed) - Nasonex/flonase nasal spray  Fever  -Acetaminophen (Tyelnol) -Ibuprofen (Advil, motrin, aleve)  Sore Throat  -Acetaminophen (Tyelnol) -Ibuprofen (Advil, motrin, aleve) -Drink a lot of water -Gargle with salt water - Rest your voice (don't talk) -Throat sprays -Cough drops  Body Aches  -Acetaminophen (Tyelnol) -Ibuprofen (Advil, motrin, aleve)  Headache  -Acetaminophen (Tyelnol) -Ibuprofen (Advil, motrin, aleve) - Exedrin, Exedrin Migraine  Allergy symptoms (cough, sneeze, runny nose, itchy eyes) -Claritin or loratadine cheapest but likely the weakest  -Zyrtec or certizine  at night because it can make you sleepy -The strongest is allegra or fexafinadine  Cheapest at [employer], [employer], [employer]  Cough  -Dextromethorphan (Delsym)- medicine that has DM in it -Guafenesin (Mucinex/Robitussin) - cough drops - drink lots of water  Chest Congestion  -Guafenesin (Mucinex/Robitussin)  Red Itchy Eyes  - Naphcon-A  Upset Stomach  - Bland diet (nothing spicy, greasy, fried, and high acid foods like tomatoes, oranges, berries) -OKAY- cereal, bread, soup, crackers, rice -Eat smaller more frequent meals -reduce caffeine, no alcohol -Loperamide (Imodium-AD) if diarrhea -Prevacid for heart burn  General health when sick  -Hydration -wash your hands frequently -keep surfaces clean -change pillow cases and sheets often -Get fresh air but do not exercise strenuously -Vitamin D, double up on it - Vitamin C -Zinc

## 2015-09-14 ENCOUNTER — Other Ambulatory Visit: Payer: Self-pay | Admitting: Physician Assistant

## 2015-09-18 ENCOUNTER — Ambulatory Visit (INDEPENDENT_AMBULATORY_CARE_PROVIDER_SITE_OTHER): Payer: 59 | Admitting: Internal Medicine

## 2015-09-18 ENCOUNTER — Encounter: Payer: Self-pay | Admitting: Internal Medicine

## 2015-09-18 VITALS — BP 122/64 | HR 78 | Temp 98.4°F | Resp 16 | Ht 65.0 in | Wt 160.0 lb

## 2015-09-18 DIAGNOSIS — B009 Herpesviral infection, unspecified: Secondary | ICD-10-CM

## 2015-09-18 MED ORDER — VALACYCLOVIR HCL 1 G PO TABS: 1000.0000 mg | ORAL_TABLET | Freq: Three times a day (TID) | ORAL | Status: AC

## 2015-09-18 MED ORDER — ACYCLOVIR 5 % EX OINT
TOPICAL_OINTMENT | CUTANEOUS | Status: DC
Start: 2015-09-18 — End: 2016-01-04

## 2015-09-18 NOTE — Patient Instructions (Signed)
Cold Sore A cold sore (fever blister) is a skin infection caused by the herpes simplex virus (HSV-1). HSV-1 is closely related to the virus that causes genital herpes (HSV-2), but they are not the same even though both viruses can cause oral and genital infections. Cold sores are small, fluid-filled sores inside of the mouth or on the lips, gums, nose, chin, cheeks, or fingers.  The herpes simplex virus can be easily passed (contagious) to other people through close personal contact, such as kissing or sharing personal items. The virus can also spread to other parts of the body, such as the eyes or genitals. Cold sores are contagious until the sores crust over completely. They often heal within 2 weeks.  Once a person is infected, the herpes simplex virus remains permanently in the body. Therefore, there is no cure for cold sores, and they often recur when a person is tired, stressed, sick, or gets too much sun. Additional factors that can cause a recurrence include hormone changes in menstruation or pregnancy, certain drugs, and cold weather.  CAUSES  Cold sores are caused by the herpes simplex virus. The virus is spread from person to person through close contact, such as through kissing, touching the affected area, or sharing personal items such as lip balm, razors, or eating utensils.  SYMPTOMS  The first infection may not cause symptoms. If symptoms develop, the symptoms often go through different stages. Here is how a cold sore develops:   Tingling, itching, or burning is felt 1-2 days before the outbreak.   Fluid-filled blisters appear on the lips, inside the mouth, nose, or on the cheeks.   The blisters start to ooze clear fluid.   The blisters dry up and a yellow crust appears in its place.   The crust falls off.  Symptoms depend on whether it is the initial outbreak or a recurrence. Some other symptoms with the first outbreak may include:   Fever.   Sore throat.   Headache.    Muscle aches.   Swollen neck glands.  DIAGNOSIS  A diagnosis is often made based on your symptoms and looking at the sores. Sometimes, a sore may be swabbed and then examined in the lab to make a final diagnosis. If the sores are not present, blood tests can find the herpes simplex virus.  TREATMENT  There is no cure for cold sores and no vaccine for the herpes simplex virus. Within 2 weeks, most cold sores go away on their own without treatment. Medicines cannot make the infection go away, but medicine can help relieve some of the pain associated with the sores, can work to stop the virus from multiplying, and can also shorten healing time. Medicine may be in the form of creams, gels, pills, or a shot.  HOME CARE INSTRUCTIONS   Only take over-the-counter or prescription medicines for pain, discomfort, or fever as directed by your caregiver. Do not use aspirin.   Use a cotton-tip swab to apply creams or gels to your sores.   Do not touch the sores or pick the scabs. Wash your hands often. Do not touch your eyes without washing your hands first.   Avoid kissing, oral sex, and sharing personal items until sores heal.   Apply an ice pack on your sores for 10-15 minutes to ease any discomfort.   Avoid hot, cold, or salty foods because they may hurt your mouth. Eat a soft, bland diet to avoid irritating the sores. Use a straw to drink  if you have pain when drinking out of a glass.   Keep sores clean and dry to prevent an infection of other tissues.   Avoid the sun and limit stress if these things trigger outbreaks. If sun causes cold sores, apply sunscreen on the lips before being out in the sun.  SEEK MEDICAL CARE IF:   You have a fever or persistent symptoms for more than 2-3 days.   You have a fever and your symptoms suddenly get worse.   You have pus, not clear fluid, coming from the sores.   You have redness that is spreading.   You have pain or irritation in your  eye.   You get sores on your genitals.   Your sores do not heal within 2 weeks.   You have a weakened immune system.   You have frequent recurrences of cold sores.  MAKE SURE YOU:   Understand these instructions.  Will watch your condition.  Will get help right away if you are not doing well or get worse.   This information is not intended to replace advice given to you by your health care provider. Make sure you discuss any questions you have with your health care provider.   Document Released: 07/11/2000 Document Revised: 08/04/2014 Document Reviewed: 11/26/2011 Elsevier Interactive Patient Education [employer].

## 2015-09-18 NOTE — Progress Notes (Signed)
   Subjective:    Patient ID: Karen Barrera, female    DOB: 1951/06/12, 65 y.o.   MRN: [id number]  HPI  Patient presents to the office for evaluation of rash in the nose and on the face since Friday (5 days).  She reports that the sore are itchy and blisters.  She reports that she has tried not to scratch.  She has had recent URI.  She does have a history of fever blisters in the past.  She usually get these when she has URI.    Review of Systems  Constitutional: Negative for fever, chills and fatigue.  HENT: Positive for congestion, postnasal drip, rhinorrhea, sinus pressure and sore throat. Negative for mouth sores, nosebleeds, trouble swallowing and voice change.   Respiratory: Negative for chest tightness, shortness of breath and wheezing.   Cardiovascular: Negative.        Objective:   Physical Exam  Constitutional: She is oriented to person, place, and time. She appears well-developed and well-nourished. No distress.  HENT:  Head: Normocephalic.  Mouth/Throat: Oropharynx is clear and moist. No oropharyngeal exudate.  vessicles noted around the opening of the nares and also along the vermillion border of the left lateral lip and a single vessicle to the right cheek.    Eyes: Conjunctivae are normal. No scleral icterus.  Neck: Normal range of motion. Neck supple. No JVD present. No thyromegaly present.  Cardiovascular: Normal rate, regular rhythm, normal heart sounds and intact distal pulses.  Exam reveals no gallop and no friction rub.   No murmur heard. Pulmonary/Chest: Effort normal and breath sounds normal. No respiratory distress. She has no wheezes. She has no rales. She exhibits no tenderness.  Musculoskeletal: Normal range of motion.  Lymphadenopathy:    She has no cervical adenopathy.  Neurological: She is alert and oriented to person, place, and time.  Skin: Skin is warm and dry. She is not diaphoretic.  Psychiatric: She has a normal mood and affect. Her behavior is  normal. Judgment and thought content normal.  Nursing note and vitals reviewed.   Filed Vitals:   09/18/15 1433  BP: 122/64  Pulse: 78  Temp: 98.4 F (36.9 C)  Resp: 16        Assessment & Plan:    1. HSV-1 (herpes simplex virus 1) infection -likely secondary to recent URI -history of HSV noted - valACYclovir (VALTREX) 1000 MG tablet; Take 1 tablet (1,000 mg total) by mouth 3 (three) times daily.  Dispense: 21 tablet; Refill: 0 - acyclovir ointment (ZOVIRAX) 5 %; Apply thin layer to affected area  Dispense: 15 g; Refill: 0

## 2015-09-20 ENCOUNTER — Other Ambulatory Visit: Payer: Self-pay | Admitting: Internal Medicine

## 2015-11-15 ENCOUNTER — Ambulatory Visit (INDEPENDENT_AMBULATORY_CARE_PROVIDER_SITE_OTHER): Payer: 59 | Admitting: Physician Assistant

## 2015-11-15 ENCOUNTER — Encounter: Payer: Self-pay | Admitting: Physician Assistant

## 2015-11-15 VITALS — Ht 65.0 in

## 2015-11-15 DIAGNOSIS — J441 Chronic obstructive pulmonary disease with (acute) exacerbation: Secondary | ICD-10-CM | POA: Diagnosis not present

## 2015-11-15 MED ORDER — FLUTICASONE FUROATE-VILANTEROL 100-25 MCG/INH IN AEPB: 1.0000 | INHALATION_SPRAY | Freq: Every day | RESPIRATORY_TRACT | Status: DC

## 2015-11-15 MED ORDER — PREDNISONE 20 MG PO TABS: ORAL_TABLET | ORAL | Status: DC

## 2015-11-15 MED ORDER — LEVOFLOXACIN 500 MG PO TABS: 500.0000 mg | ORAL_TABLET | Freq: Every day | ORAL | Status: DC

## 2015-11-15 MED ORDER — PROMETHAZINE-CODEINE 6.25-10 MG/5ML PO SYRP: 5.0000 mL | ORAL_SOLUTION | Freq: Four times a day (QID) | ORAL | Status: DC | PRN

## 2015-11-15 NOTE — Patient Instructions (Signed)
Can do samples steroid inhaler if do not tolerate oral steroids, do 1 puff twice a day and wash mouth out afterwards to avoid yeast.   Please pick one of the over the counter allergy medications below and take it once daily for allergies.  Claritin or loratadine cheapest but likely the weakest  Zyrtec or certizine at night because it can make you sleepy The strongest is allegra or fexafinadine  Cheapest at [employer], [employer], [employer]  Chronic Obstructive Pulmonary Disease Chronic obstructive pulmonary disease (COPD) is a common lung condition in which airflow from the lungs is limited. COPD is a general term that can be used to describe many different lung problems that limit airflow, including both chronic bronchitis and emphysema. If you have COPD, your lung function will probably never return to normal, but there are measures you can take to improve lung function and make yourself feel better. CAUSES   Smoking (common).  Exposure to secondhand smoke.  Genetic problems.  Chronic inflammatory lung diseases or recurrent infections. SYMPTOMS  Shortness of breath, especially with physical activity.  Deep, persistent (chronic) cough with a large amount of thick mucus.  Wheezing.  Rapid breaths (tachypnea).  Gray or bluish discoloration (cyanosis) of the skin, especially in your fingers, toes, or lips.  Fatigue.  Weight loss.  Frequent infections or episodes when breathing symptoms become much worse (exacerbations).  Chest tightness. DIAGNOSIS Your health care provider will take a medical history and perform a physical examination to diagnose COPD. Additional tests for COPD may include:  Lung (pulmonary) function tests.  Chest X-ray.  CT scan.  Blood tests. TREATMENT  Treatment for COPD may include:  Inhaler and nebulizer medicines. These help manage the symptoms of COPD and make your breathing more comfortable.  Supplemental oxygen. Supplemental oxygen is only helpful if  you have a low oxygen level in your blood.  Exercise and physical activity. These are beneficial for nearly all people with COPD.  Lung surgery or transplant.  Nutrition therapy to gain weight, if you are underweight.  Pulmonary rehabilitation. This may involve working with a team of health care providers and specialists, such as respiratory, occupational, and physical therapists. HOME CARE INSTRUCTIONS  Take all medicines (inhaled or pills) as directed by your health care provider.  Avoid over-the-counter medicines or cough syrups that dry up your airway (such as antihistamines) and slow down the elimination of secretions unless instructed otherwise by your health care provider.  If you are a smoker, the most important thing that you can do is stop smoking. Continuing to smoke will cause further lung damage and breathing trouble. Ask your health care provider for help with quitting smoking. He or she can direct you to community resources or hospitals that provide support.  Avoid exposure to irritants such as smoke, chemicals, and fumes that aggravate your breathing.  Use oxygen therapy and pulmonary rehabilitation if directed by your health care provider. If you require home oxygen therapy, ask your health care provider whether you should purchase a pulse oximeter to measure your oxygen level at home.  Avoid contact with individuals who have a contagious illness.  Avoid extreme temperature and humidity changes.  Eat healthy foods. Eating smaller, more frequent meals and resting before meals may help you maintain your strength.  Stay active, but balance activity with periods of rest. Exercise and physical activity will help you maintain your ability to do things you want to do.  Preventing infection and hospitalization is very important when you have COPD.  Make sure to receive all the vaccines your health care provider recommends, especially the pneumococcal and influenza vaccines. Ask  your health care provider whether you need a pneumonia vaccine.  Learn and use relaxation techniques to manage stress.  Learn and use controlled breathing techniques as directed by your health care provider. Controlled breathing techniques include:  Pursed lip breathing. Start by breathing in (inhaling) through your nose for 1 second. Then, purse your lips as if you were going to whistle and breathe out (exhale) through the pursed lips for 2 seconds.  Diaphragmatic breathing. Start by putting one hand on your abdomen just above your waist. Inhale slowly through your nose. The hand on your abdomen should move out. Then purse your lips and exhale slowly. You should be able to feel the hand on your abdomen moving in as you exhale.  Learn and use controlled coughing to clear mucus from your lungs. Controlled coughing is a series of short, progressive coughs. The steps of controlled coughing are: 1. Lean your head slightly forward. 2. Breathe in deeply using diaphragmatic breathing. 3. Try to hold your breath for 3 seconds. 4. Keep your mouth slightly open while coughing twice. 5. Spit any mucus out into a tissue. 6. Rest and repeat the steps once or twice as needed. SEEK MEDICAL CARE IF:  You are coughing up more mucus than usual.  There is a change in the color or thickness of your mucus.  Your breathing is more labored than usual.  Your breathing is faster than usual. SEEK IMMEDIATE MEDICAL CARE IF:  You have shortness of breath while you are resting.  You have shortness of breath that prevents you from:  Being able to talk.  Performing your usual physical activities.  You have chest pain lasting longer than 5 minutes.  Your skin color is more cyanotic than usual.  You measure low oxygen saturations for longer than 5 minutes with a pulse oximeter. MAKE SURE YOU:  Understand these instructions.  Will watch your condition.  Will get help right away if you are not doing well  or get worse.   This information is not intended to replace advice given to you by your health care provider. Make sure you discuss any questions you have with your health care provider.   Document Released: 04/23/2005 Document Revised: 08/04/2014 Document Reviewed: 03/10/2013 Elsevier Interactive Patient Education [employer].

## 2015-11-15 NOTE — Progress Notes (Signed)
   Subjective:    Patient ID: Karen Barrera, female    DOB: 01/10/1951, 65 y.o.   MRN: [id number]  Cough This is a recurrent problem. The current episode started 1 to 4 weeks ago. The problem has been gradually worsening. The problem occurs every few minutes. The cough is non-productive. Associated symptoms include ear pain, nasal congestion and postnasal drip. Pertinent negatives include no chills, fever, headaches, sore throat, shortness of breath or wheezing.   65 y.o. WF has had cough x 2 weeks, occ productive at times, wheezing. Denies  fever, chills. Has not smoked in several days.   Height '5\' 5"'$  (1.651 m).  Review of Systems  Constitutional: Negative for fever, chills and diaphoresis.  HENT: Positive for congestion, ear pain, postnasal drip, sinus pressure and sneezing. Negative for sore throat.   Respiratory: Positive for cough. Negative for chest tightness, shortness of breath and wheezing.   Cardiovascular: Negative.   Gastrointestinal: Negative.   Genitourinary: Negative.   Musculoskeletal: Negative for neck pain.  Neurological: Negative for headaches.       Objective:   Physical Exam  Constitutional: She is oriented to person, place, and time. She appears well-developed and well-nourished.  HENT:  Right Ear: Hearing and external ear normal. No mastoid tenderness. Tympanic membrane is injected. Tympanic membrane is not perforated, not erythematous, not retracted and not bulging. A middle ear effusion is present.  Left Ear: Hearing and external ear normal. No mastoid tenderness. Tympanic membrane is injected. Tympanic membrane is not perforated, not erythematous, not retracted and not bulging. A middle ear effusion is present.  Nose: Right sinus exhibits maxillary sinus tenderness. Left sinus exhibits maxillary sinus tenderness.  Mouth/Throat: Uvula is midline, oropharynx is clear and moist and mucous membranes are normal.  Eyes: Conjunctivae and EOM are normal. Pupils are  equal, round, and reactive to light.  Neck: Neck supple.  Cardiovascular: Normal rate and regular rhythm.   Pulmonary/Chest: Effort normal. No respiratory distress. She has wheezes (worse left lower lung base).  Abdominal: Soft. Bowel sounds are normal.  Musculoskeletal: Normal range of motion.  Lymphadenopathy:    She has no cervical adenopathy.  Neurological: She is alert and oriented to person, place, and time.  Skin: Skin is warm and dry.       Assessment & Plan:  1. Cough  increase fluids, rest, cont allergy pill, if not better need CXR/go to ER - predniSONE (DELTASONE) 20 MG tablet; 2 tablets daily for 3 days, 1 tablet daily for 4 days.  Dispense: 10 tablet; Refill: 0 - Levaquin '500mg'$  #10 - Breo samples given and RX given for COPD  2. Smoking cessation  Discussed, going to try to quit.

## 2015-12-28 ENCOUNTER — Other Ambulatory Visit: Payer: Self-pay | Admitting: Internal Medicine

## 2016-01-04 ENCOUNTER — Encounter (HOSPITAL_BASED_OUTPATIENT_CLINIC_OR_DEPARTMENT_OTHER): Payer: Self-pay | Admitting: *Deleted

## 2016-01-04 ENCOUNTER — Other Ambulatory Visit: Payer: Self-pay | Admitting: Orthopedic Surgery

## 2016-01-11 ENCOUNTER — Ambulatory Visit (HOSPITAL_BASED_OUTPATIENT_CLINIC_OR_DEPARTMENT_OTHER): Payer: 59 | Admitting: Certified Registered"

## 2016-01-11 ENCOUNTER — Encounter (HOSPITAL_BASED_OUTPATIENT_CLINIC_OR_DEPARTMENT_OTHER)
Admission: RE | Disposition: A | Discharge status: Home / Self Care | Payer: Self-pay | Source: Ambulatory Visit | Attending: Orthopedic Surgery

## 2016-01-11 ENCOUNTER — Ambulatory Visit (HOSPITAL_BASED_OUTPATIENT_CLINIC_OR_DEPARTMENT_OTHER)
Admission: RE | Admit: 2016-01-11 | Discharge: 2016-01-11 | Disposition: A | Discharge status: Home / Self Care | Payer: 59 | Source: Ambulatory Visit | Attending: Orthopedic Surgery | Admitting: Orthopedic Surgery

## 2016-01-11 ENCOUNTER — Encounter (HOSPITAL_BASED_OUTPATIENT_CLINIC_OR_DEPARTMENT_OTHER): Payer: Self-pay | Admitting: Anesthesiology

## 2016-01-11 DIAGNOSIS — F17210 Nicotine dependence, cigarettes, uncomplicated: Secondary | ICD-10-CM | POA: Diagnosis not present

## 2016-01-11 DIAGNOSIS — J449 Chronic obstructive pulmonary disease, unspecified: Secondary | ICD-10-CM | POA: Diagnosis not present

## 2016-01-11 DIAGNOSIS — R229 Localized swelling, mass and lump, unspecified: Secondary | ICD-10-CM | POA: Diagnosis present

## 2016-01-11 DIAGNOSIS — M1990 Unspecified osteoarthritis, unspecified site: Secondary | ICD-10-CM | POA: Insufficient documentation

## 2016-01-11 DIAGNOSIS — Z7982 Long term (current) use of aspirin: Secondary | ICD-10-CM | POA: Diagnosis not present

## 2016-01-11 DIAGNOSIS — K219 Gastro-esophageal reflux disease without esophagitis: Secondary | ICD-10-CM | POA: Insufficient documentation

## 2016-01-11 DIAGNOSIS — E785 Hyperlipidemia, unspecified: Secondary | ICD-10-CM | POA: Diagnosis not present

## 2016-01-11 DIAGNOSIS — D367 Benign neoplasm of other specified sites: Secondary | ICD-10-CM | POA: Diagnosis not present

## 2016-01-11 HISTORY — PX: ARTHROTOMY: SHX5728

## 2016-01-11 HISTORY — PX: CYST EXCISION: SHX5701

## 2016-01-11 SURGERY — CYST REMOVAL
Anesthesia: Monitor Anesthesia Care | Site: Finger | Laterality: Right

## 2016-01-11 MED ORDER — ONDANSETRON HCL 4 MG/2ML IJ SOLN
INTRAMUSCULAR | Status: AC
  Filled 2016-01-11: qty 2

## 2016-01-11 MED ORDER — DEXAMETHASONE SODIUM PHOSPHATE 4 MG/ML IJ SOLN
INTRAMUSCULAR | Status: DC | PRN
  Administered 2016-01-11: 10 mg via INTRAVENOUS

## 2016-01-11 MED ORDER — SCOPOLAMINE 1 MG/3DAYS TD PT72: 1.0000 | MEDICATED_PATCH | Freq: Once | TRANSDERMAL | Status: DC | PRN

## 2016-01-11 MED ORDER — LIDOCAINE 2% (20 MG/ML) 5 ML SYRINGE
INTRAMUSCULAR | Status: AC
  Filled 2016-01-11: qty 5

## 2016-01-11 MED ORDER — FENTANYL CITRATE (PF) 100 MCG/2ML IJ SOLN: 25.0000 ug | INTRAMUSCULAR | Status: DC | PRN

## 2016-01-11 MED ORDER — CEFAZOLIN SODIUM-DEXTROSE 2-4 GM/100ML-% IV SOLN
INTRAVENOUS | Status: AC
  Filled 2016-01-11: qty 100

## 2016-01-11 MED ORDER — BUPIVACAINE HCL (PF) 0.25 % IJ SOLN
INTRAMUSCULAR | Status: AC
  Filled 2016-01-11: qty 30

## 2016-01-11 MED ORDER — DEXAMETHASONE SODIUM PHOSPHATE 10 MG/ML IJ SOLN
INTRAMUSCULAR | Status: AC
  Filled 2016-01-11: qty 1

## 2016-01-11 MED ORDER — PROPOFOL 10 MG/ML IV BOLUS
INTRAVENOUS | Status: DC | PRN
  Administered 2016-01-11: 200 mg via INTRAVENOUS

## 2016-01-11 MED ORDER — LIDOCAINE HCL (CARDIAC) 20 MG/ML IV SOLN
INTRAVENOUS | Status: DC | PRN
  Administered 2016-01-11: 30 mg via INTRAVENOUS

## 2016-01-11 MED ORDER — FENTANYL CITRATE (PF) 100 MCG/2ML IJ SOLN
INTRAMUSCULAR | Status: AC
  Filled 2016-01-11: qty 2

## 2016-01-11 MED ORDER — BUPIVACAINE HCL (PF) 0.25 % IJ SOLN
INTRAMUSCULAR | Status: DC | PRN
  Administered 2016-01-11: 10 mL

## 2016-01-11 MED ORDER — OXYCODONE HCL 5 MG/5ML PO SOLN: 5.0000 mg | Freq: Once | ORAL | Status: DC | PRN

## 2016-01-11 MED ORDER — FENTANYL CITRATE (PF) 100 MCG/2ML IJ SOLN
INTRAMUSCULAR | Status: DC | PRN
  Administered 2016-01-11: 100 ug via INTRAVENOUS

## 2016-01-11 MED ORDER — HYDROCODONE-ACETAMINOPHEN 5-325 MG PO TABS: ORAL_TABLET | ORAL | Status: DC

## 2016-01-11 MED ORDER — ONDANSETRON HCL 4 MG/2ML IJ SOLN
INTRAMUSCULAR | Status: DC | PRN
  Administered 2016-01-11: 4 mg via INTRAVENOUS

## 2016-01-11 MED ORDER — GLYCOPYRROLATE 0.2 MG/ML IJ SOLN: 0.2000 mg | Freq: Once | INTRAMUSCULAR | Status: DC | PRN

## 2016-01-11 MED ORDER — CEFAZOLIN SODIUM-DEXTROSE 2-4 GM/100ML-% IV SOLN
2.0000 g | INTRAVENOUS | Status: AC
  Administered 2016-01-11: 2 g via INTRAVENOUS

## 2016-01-11 MED ORDER — ACETAMINOPHEN 325 MG PO TABS: 325.0000 mg | ORAL_TABLET | ORAL | Status: DC | PRN

## 2016-01-11 MED ORDER — MIDAZOLAM HCL 2 MG/2ML IJ SOLN
INTRAMUSCULAR | Status: AC
  Filled 2016-01-11: qty 2

## 2016-01-11 MED ORDER — FENTANYL CITRATE (PF) 100 MCG/2ML IJ SOLN: 50.0000 ug | INTRAMUSCULAR | Status: DC | PRN

## 2016-01-11 MED ORDER — LACTATED RINGERS IV SOLN
INTRAVENOUS | Status: DC
  Administered 2016-01-11 (×2): via INTRAVENOUS

## 2016-01-11 MED ORDER — MIDAZOLAM HCL 2 MG/2ML IJ SOLN: 1.0000 mg | INTRAMUSCULAR | Status: DC | PRN

## 2016-01-11 MED ORDER — MIDAZOLAM HCL 5 MG/5ML IJ SOLN
INTRAMUSCULAR | Status: DC | PRN
  Administered 2016-01-11: 2 mg via INTRAVENOUS

## 2016-01-11 MED ORDER — PROPOFOL 10 MG/ML IV BOLUS
INTRAVENOUS | Status: AC
  Filled 2016-01-11: qty 40

## 2016-01-11 MED ORDER — OXYCODONE HCL 5 MG PO TABS: 5.0000 mg | ORAL_TABLET | Freq: Once | ORAL | Status: DC | PRN

## 2016-01-11 MED ORDER — CHLORHEXIDINE GLUCONATE 4 % EX LIQD: 60.0000 mL | Freq: Once | CUTANEOUS | Status: DC

## 2016-01-11 MED ORDER — ACETAMINOPHEN 160 MG/5ML PO SOLN
325.0000 mg | ORAL | Status: DC | PRN
Start: 2016-01-11 — End: 2016-01-11

## 2016-01-11 SURGICAL SUPPLY — 49 items
BANDAGE ACE 3X5.8 VEL STRL LF (GAUZE/BANDAGES/DRESSINGS) IMPLANT
BANDAGE COBAN STERILE 2 (GAUZE/BANDAGES/DRESSINGS) IMPLANT
BENZOIN TINCTURE PRP APPL 2/3 (GAUZE/BANDAGES/DRESSINGS) IMPLANT
BLADE MINI RND TIP GREEN BEAV (BLADE) IMPLANT
BLADE SURG 15 STRL LF DISP TIS (BLADE) ×2 IMPLANT
BLADE SURG 15 STRL SS (BLADE) ×2
BNDG COHESIVE 1X5 TAN STRL LF (GAUZE/BANDAGES/DRESSINGS) ×2 IMPLANT
BNDG CONFORM 2 STRL LF (GAUZE/BANDAGES/DRESSINGS) IMPLANT
BNDG ELASTIC 2X5.8 VLCR STR LF (GAUZE/BANDAGES/DRESSINGS) IMPLANT
BNDG ESMARK 4X9 LF (GAUZE/BANDAGES/DRESSINGS) ×2 IMPLANT
BNDG GAUZE 1X2.1 STRL (MISCELLANEOUS) IMPLANT
BNDG GAUZE ELAST 4 BULKY (GAUZE/BANDAGES/DRESSINGS) IMPLANT
BNDG PLASTER X FAST 3X3 WHT LF (CAST SUPPLIES) IMPLANT
CHLORAPREP W/TINT 26ML (MISCELLANEOUS) ×2 IMPLANT
CORDS BIPOLAR (ELECTRODE) ×2 IMPLANT
COVER BACK TABLE 60X90IN (DRAPES) ×2 IMPLANT
COVER MAYO STAND STRL (DRAPES) ×2 IMPLANT
CUFF TOURNIQUET SINGLE 18IN (TOURNIQUET CUFF) ×2 IMPLANT
DRAPE EXTREMITY T 121X128X90 (DRAPE) ×2 IMPLANT
DRAPE SURG 17X23 STRL (DRAPES) ×2 IMPLANT
GAUZE SPONGE 4X4 12PLY STRL (GAUZE/BANDAGES/DRESSINGS) ×2 IMPLANT
GAUZE XEROFORM 1X8 LF (GAUZE/BANDAGES/DRESSINGS) ×2 IMPLANT
GLOVE BIO SURGEON STRL SZ 6.5 (GLOVE) ×2 IMPLANT
GLOVE BIO SURGEON STRL SZ7.5 (GLOVE) ×2 IMPLANT
GLOVE BIOGEL PI IND STRL 7.0 (GLOVE) ×1 IMPLANT
GLOVE BIOGEL PI IND STRL 8 (GLOVE) ×1 IMPLANT
GLOVE BIOGEL PI INDICATOR 7.0 (GLOVE) ×1
GLOVE BIOGEL PI INDICATOR 8 (GLOVE) ×1
GLOVE EXAM NITRILE EXT CUFF MD (GLOVE) ×2 IMPLANT
GOWN STRL REUS W/ TWL LRG LVL3 (GOWN DISPOSABLE) ×1 IMPLANT
GOWN STRL REUS W/TWL LRG LVL3 (GOWN DISPOSABLE) ×1
NEEDLE HYPO 25X1 1.5 SAFETY (NEEDLE) ×2 IMPLANT
NS IRRIG 1000ML POUR BTL (IV SOLUTION) ×2 IMPLANT
PACK BASIN DAY SURGERY FS (CUSTOM PROCEDURE TRAY) ×2 IMPLANT
PAD CAST 3X4 CTTN HI CHSV (CAST SUPPLIES) IMPLANT
PAD CAST 4YDX4 CTTN HI CHSV (CAST SUPPLIES) IMPLANT
PADDING CAST ABS 4INX4YD NS (CAST SUPPLIES)
PADDING CAST ABS COTTON 4X4 ST (CAST SUPPLIES) IMPLANT
PADDING CAST COTTON 3X4 STRL (CAST SUPPLIES)
PADDING CAST COTTON 4X4 STRL (CAST SUPPLIES)
SPLINT FINGER 3.25 911903 (SOFTGOODS) ×2 IMPLANT
STOCKINETTE 4X48 STRL (DRAPES) ×2 IMPLANT
STRIP CLOSURE SKIN 1/2X4 (GAUZE/BANDAGES/DRESSINGS) IMPLANT
SUT ETHILON 3 0 PS 1 (SUTURE) IMPLANT
SUT ETHILON 4 0 PS 2 18 (SUTURE) ×2 IMPLANT
SYR BULB 3OZ (MISCELLANEOUS) ×2 IMPLANT
SYR CONTROL 10ML LL (SYRINGE) ×2 IMPLANT
TOWEL OR 17X24 6PK STRL BLUE (TOWEL DISPOSABLE) ×4 IMPLANT
UNDERPAD 30X30 (UNDERPADS AND DIAPERS) ×2 IMPLANT

## 2016-01-11 NOTE — Anesthesia Preprocedure Evaluation (Signed)
Anesthesia Evaluation  Patient identified by MRN, date of birth, ID band Patient awake    Reviewed: Allergy & Precautions, NPO status , Patient's Chart, lab work & pertinent test results  History of Anesthesia Complications Negative for: history of anesthetic complications  Airway Mallampati: II  TM Distance: >3 FB Neck ROM: Full    Dental  (+) Teeth Intact   Pulmonary shortness of breath, neg sleep apnea, COPD,  COPD inhaler, neg recent URI, Current Smoker, neg PE   breath sounds clear to auscultation       Cardiovascular negative cardio ROS   Rhythm:Regular     Neuro/Psych  Headaches, neg Seizures PSYCHIATRIC DISORDERS Depression    GI/Hepatic Neg liver ROS, GERD  Medicated and Controlled,  Endo/Other  negative endocrine ROS  Renal/GU Renal disease     Musculoskeletal  (+) Arthritis ,   Abdominal   Peds  Hematology   Anesthesia Other Findings   Reproductive/Obstetrics                             Anesthesia Physical Anesthesia Plan  ASA: II  Anesthesia Plan: MAC and Bier Block   Post-op Pain Management:    Induction: Intravenous  Airway Management Planned: Natural Airway, Nasal Cannula and Simple Face Mask  Additional Equipment: None  Intra-op Plan:   Post-operative Plan:   Informed Consent: I have reviewed the patients History and Physical, chart, labs and discussed the procedure including the risks, benefits and alternatives for the proposed anesthesia with the patient or authorized representative who has indicated his/her understanding and acceptance.   Dental advisory given  Plan Discussed with: CRNA and Surgeon  Anesthesia Plan Comments:         Anesthesia Quick Evaluation

## 2016-01-11 NOTE — Anesthesia Postprocedure Evaluation (Signed)
Anesthesia Post Note  Patient: Karen Barrera  Procedure(s) Performed: Procedure(s) (LRB): EXCISION MUCOID CYST (Right) DISTAL INTERPHALANGEAL JOINT ARTHROTOMY RIGHT INDEX FINGER (Right)  Patient location during evaluation: PACU Anesthesia Type: General Level of consciousness: awake Pain management: pain level controlled Vital Signs Assessment: post-procedure vital signs reviewed and stable Respiratory status: spontaneous breathing Cardiovascular status: stable Postop Assessment: no signs of nausea or vomiting Anesthetic complications: no    Last Vitals:  Filed Vitals:   01/11/16 1530 01/11/16 1548  BP: 122/71 120/72  Pulse: 70 76  Temp:  36.4 C  Resp: 15 16    Last Pain:  Filed Vitals:   01/11/16 1559  PainSc: 0-No pain                 Omauri Boeve

## 2016-01-11 NOTE — H&P (Signed)
Karen Barrera is an 65 y.o. female.   Chief Complaint: right index mucoid cyst HPI: 66 yo rhd female with mass on right index finger.  It is bothersome to her.  She wishes to have it removed and the dip joint debrided to try to prevent recurrence.  Allergies:  Allergies  Allergen Reactions  . Chantix [Varenicline] Anaphylaxis    Makes throat swell up    Past Medical History  Diagnosis Date  . Allergy   . Hyperlipidemia     "borderline; RX is preventative" (10/31/2014)  . Osteopenia   . Anemia   . Pre-diabetes   . Vitamin D deficiency   . Tobacco dependence   . Colon polyp   . Allergic rhinitis   . Kidney stones     "they passed"  . Family history of adverse reaction to anesthesia     "mother is hard to wake up; a little goes a long way w/her"  . Pneumonia 2003 X 1  . Chronic bronchitis ([place])     "got it q yr til I had my nose OR" (10/31/2014)  . GERD (gastroesophageal reflux disease)   . History of stomach ulcers   . Migraine     "used to get them alot; don't get them anymore" (10/31/2014)  . Seizures ([place]) ~ 1962 X 1    "from an abscessed wisdom tooth"  . Arthritis     "fingers" (10/31/2014)  . Osteoarthritis of both knees   . DDD (degenerative disc disease), cervical   . DDD (degenerative disc disease), lumbar   . Depression     "short term when my sister passed away unexpectedly"  . Acute perforated appendicitis 10/31/2014    Past Surgical History  Procedure Laterality Date  . Shoulder arthroscopy w/ rotator cuff repair Left 2012  . Tendon repair Left 10/2009    torn tendon @ elbow  . Shoulder arthroscopy Right 2003    removal spurs  . Salpingoophorectomy Right 1999    "w/grapefruit-sized polyp"  . Sinus exploration  12/2013    Karen Barrera  . Vaginal hysterectomy  1978  . Laparoscopic appendectomy N/A 10/31/2014    Procedure: APPENDECTOMY LAPAROSCOPIC;  Surgeon: Alphonsa Overall, MD;  Location: [facility];  Service: General;  Laterality: N/A;  . Appendectomy    . Colonoscopy       Family History: Family History  Problem Relation Age of Onset  . Stomach cancer Neg Hx   . Esophageal cancer Neg Hx   . Rectal cancer Neg Hx   . Colon cancer Other 59  . Colon cancer Other 54  . Diabetes Mother   . Cancer Mother     skin  . Seizures Sister   . Cancer Maternal Aunt     breast  . Cancer Paternal Aunt     breast  . Cancer Paternal Uncle     colon  . Stroke Maternal Grandmother   . Hyperlipidemia Maternal Grandfather   . Cancer Paternal Grandmother     breast  . Arthritis Paternal Grandmother     Social History:   reports that she has been smoking Cigarettes.  She has a 25 pack-year smoking history. She has never used smokeless tobacco. She reports that she does not drink alcohol or use illicit drugs.  Medications: Medications Prior to Admission  Medication Sig Dispense Refill  . albuterol (PROVENTIL HFA;VENTOLIN HFA) 108 (90 BASE) MCG/ACT inhaler Inhale 2 puffs into the lungs every 6 (six) hours as needed for wheezing or shortness of breath. 3  Inhaler 2  . ALPRAZolam (XANAX) 0.25 MG tablet Take 1 tablet (0.25 mg total) by mouth 2 (two) times daily as needed for anxiety. 60 tablet 0  . aspirin 81 MG tablet Take 81 mg by mouth daily.    . B Complex-C (SUPER B COMPLEX PO) Take by mouth daily.    . Cholecalciferol (VITAMIN D-3) 5000 UNITS TABS Take by mouth daily.    . Coenzyme Q10 (COQ10) 200 MG CAPS Take 200 mg by mouth daily.    . fexofenadine (ALLEGRA) 180 MG tablet Take 180 mg by mouth daily.    . fluticasone furoate-vilanterol (BREO ELLIPTA) 100-25 MCG/INH AEPB Inhale 1 puff into the lungs daily. Rinse mouth with water after each use 1 each 5  . Magnesium 300 MG CAPS Take by mouth daily.    . Melatonin 5 MG CAPS Take 2 capsules by mouth at bedtime.     . montelukast (SINGULAIR) 10 MG tablet Take 1 tablet (10 mg total) by mouth at bedtime. 90 tablet 1  . Probiotic Product (PROBIOTIC DAILY PO) Take 1 tablet by mouth daily.    . rosuvastatin (CRESTOR) 10  MG tablet TAKE 1 TABLET (10 MG TOTAL) BY MOUTH DAILY. 90 tablet 3  . sertraline (ZOLOFT) 100 MG tablet TAKE 1 TABLET (100 MG TOTAL) BY MOUTH DAILY. 90 tablet 0  . diphenhydrAMINE (BENADRYL) 25 MG tablet Take 25 mg by mouth every 6 (six) hours as needed.      No results found for this or any previous visit (from the past 48 hour(s)).  No results found.   A comprehensive review of systems was negative.  Blood pressure 116/73, pulse 79, temperature 97.9 F (36.6 C), temperature source Oral, resp. rate 16, height 5' 4.25" (1.632 m), weight 71.215 kg (157 lb), SpO2 100 %.  General appearance: alert, cooperative and appears stated age Head: Normocephalic, without obvious abnormality, atraumatic Neck: supple, symmetrical, trachea midline Resp: clear to auscultation bilaterally Cardio: regular rate and rhythm GI: non-tender Extremities: Intact sensation and capillary refill all digits.  +epl/fpl/io.  No wounds.  Pulses: 2+ and symmetric Skin: Skin color, texture, turgor normal. 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  Non operative and operative treatment options were discussed with the patient and patient wishes to proceed with operative treatment. We also discussed the possible need for a rotation flap for skin coverage.  Risks, benefits, and alternatives of surgery were discussed and the patient agrees with the plan of care.   Karen Barrera R 01/11/2016, 1:57 PM

## 2016-01-11 NOTE — Discharge Instructions (Addendum)

## 2016-01-11 NOTE — Op Note (Signed)
[id number] 

## 2016-01-11 NOTE — Anesthesia Procedure Notes (Signed)
Procedure Name: LMA Insertion Date/Time: 01/11/2016 2:13 PM Performed by: Toula Moos L Pre-anesthesia Checklist: Patient identified, Emergency Drugs available, Suction available, Patient being monitored and Timeout performed Patient Re-evaluated:Patient Re-evaluated prior to inductionOxygen Delivery Method: Circle system utilized Preoxygenation: Pre-oxygenation with 100% oxygen Intubation Type: IV induction Ventilation: Mask ventilation without difficulty LMA: LMA inserted LMA Size: 4.0 Number of attempts: 1 Airway Equipment and Method: Bite block Placement Confirmation: positive ETCO2 Tube secured with: Tape Dental Injury: Teeth and Oropharynx as per pre-operative assessment

## 2016-01-11 NOTE — Transfer of Care (Signed)
Immediate Anesthesia Transfer of Care Note  Patient: Karen Barrera  Procedure(s) Performed: Procedure(s): EXCISION MUCOID CYST (Right) DISTAL INTERPHALANGEAL JOINT ARTHROTOMY RIGHT INDEX FINGER (Right)  Patient Location: PACU  Anesthesia Type:General  Level of Consciousness: sedated  Airway & Oxygen Therapy: Patient Spontanous Breathing and Patient connected to face mask oxygen  Post-op Assessment: Report given to RN and Post -op Vital signs reviewed and stable  Post vital signs: Reviewed and stable  Last Vitals:  Filed Vitals:   01/11/16 1239 01/11/16 1254  BP: 116/76 116/73  Pulse: 79 79  Temp: 36.6 C 36.6 C  Resp: 18 16    Last Pain: There were no vitals filed for this visit.       Complications: No apparent anesthesia complications

## 2016-01-11 NOTE — Brief Op Note (Signed)
01/11/2016  2:42 PM  PATIENT:  Karen Barrera  65 y.o. female  PRE-OPERATIVE DIAGNOSIS:  MUCOID CYST RIGHT INDEX FINGER   POST-OPERATIVE DIAGNOSIS:  MUCOID CYST RIGHT INDEX FINGER   PROCEDURE:  Procedure(s): EXCISION MUCOID CYST WITH (Right) DISTAL INTERPHALANGEAL JOINT ARTHROTOMY RIGHT INDEX FINGER POSSIBLE ROTATION FLAP  (Right)  SURGEON:  Surgeon(s) and Role:    * Leanora Cover, MD - Primary  PHYSICIAN ASSISTANT:   ASSISTANTS: none   ANESTHESIA:   general  EBL:  Total I/O In: 1000 [I.V.:1000] Out: -   BLOOD ADMINISTERED:none  DRAINS: none   LOCAL MEDICATIONS USED:  MARCAINE     SPECIMEN:  Source of Specimen:  right index finger  DISPOSITION OF SPECIMEN:  PATHOLOGY  COUNTS:  YES  TOURNIQUET:   Total Tourniquet Time Documented: Upper Arm (Right) - 17 minutes Total: Upper Arm (Right) - 17 minutes   DICTATION: .Other Dictation: Dictation Number [id number]  PLAN OF CARE: Discharge to home after PACU  PATIENT DISPOSITION:  PACU - hemodynamically stable.

## 2016-01-12 NOTE — Op Note (Signed)
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  PROCEDURES:   1. Right index finger excision of mucoid cyst 2. Debridement of distal interphalangeal joint.  SURGEON:  Leanora Cover, MD  ASSISTANT:  None.  ANESTHESIA:  General.  IV FLUIDS:  Per Anesthesia flow sheet.  ESTIMATED BLOOD LOSS:  Minimal.  COMPLICATIONS:  None.  SPECIMENS:  Mucoid cyst to Pathology.  TOURNIQUET TIME:  17 minutes.  DISPOSITION:  Stable to PACU.  INDICATIONS:  Karen Barrera is a 65 year old female who has had mass on her right index finger.  It is bothersome to her.  She wishes to have it excised and DIP joint debrided to try and prevent recurrence.  Risks, benefits and alternatives of the surgery were discussed including the risk of blood loss; infection; damage to nerves, vessels, tendons, ligaments, bone; failure of surgery; need for additional surgery; complications with wound healing; continued pain; recurrence of the cyst.  She voiced understanding of these risks and elected to proceed.  OPERATIVE COURSE:  After being identified preoperatively by myself, the patient and I agreed upon the procedure and site of procedure.  Surgical site was marked.  The risks, benefits, and alternatives of surgery were reviewed and she wished to proceed.  Surgical consent had been signed. She was given IV Ancef as preoperative antibiotic prophylaxis.  She was transferred to the operating room and placed on the operating room table in supine position with  the right upper extremity on an armboard. General anesthesia was induced by Anesthesiology.  Right upper extremity was prepped and draped in normal sterile orthopedic fashion and surgical pause was performed between the surgeons, anesthesia and operating room staff, and all were in agreement as to the patient, procedure and site of procedure.  Tourniquet at the proximal aspect of the extremity was inflated to 250 mmHg after exsanguination of the limb with an Esmarch bandage.  Hockey stick shaped incision was made on right index finger at the level of the DIP joint and carried into the subcutaneous tissues by spreading technique.  The cyst was identified.  It was excised and sent to Pathology for examination.  The DIP joint was entered underneath the extensor tendon and debrided.  Small amount of bone was removed, the dorsal aspect of the radial condyle of the middle phalanx, which had pushed the tendon ulnarly.  The wound was then copiously irrigated with sterile saline.  It was closed with 4-0 nylon in a horizontal mattress fashion.  A digital block had been performed with 10 mL of 0.25% plain Marcaine to aid in postoperative analgesia.  It was then dressed with sterile Xeroform, 4x4s, and wrapped lightly with a Coban  dressing. Tourniquet was deflated at 17 minutes.  Fingertips were pink with brisk capillary refill after deflation of the tourniquet.  Operative drapes were broken down and the patient was awoken from anesthesia safely.  She was transferred back to the stretcher and taken to the PACU in stable condition.  I will see her back in the office in 1 week for postoperative followup.  I will give her Norco 5/325, 1-2 p.o. q.6 hours p.r.n. pain, dispensed #20.     Leanora Cover, MD     KK/MEDQ  D:  01/11/2016  T:  01/12/2016  Job:  [id number]

## 2016-01-14 ENCOUNTER — Encounter (HOSPITAL_BASED_OUTPATIENT_CLINIC_OR_DEPARTMENT_OTHER): Payer: Self-pay | Admitting: Orthopedic Surgery

## 2016-01-20 ENCOUNTER — Other Ambulatory Visit: Payer: Self-pay | Admitting: Internal Medicine

## 2016-02-21 ENCOUNTER — Other Ambulatory Visit: Payer: Self-pay | Admitting: Internal Medicine

## 2016-03-04 LAB — HM DEXA SCAN

## 2016-03-28 ENCOUNTER — Other Ambulatory Visit: Payer: Self-pay | Admitting: Internal Medicine

## 2016-05-13 ENCOUNTER — Encounter: Payer: Self-pay | Admitting: Internal Medicine

## 2016-05-13 ENCOUNTER — Ambulatory Visit (INDEPENDENT_AMBULATORY_CARE_PROVIDER_SITE_OTHER): Payer: 59 | Admitting: Internal Medicine

## 2016-05-13 VITALS — BP 132/74 | HR 83 | Temp 97.9°F | Resp 14 | Ht 64.5 in | Wt 163.4 lb

## 2016-05-13 DIAGNOSIS — Z79899 Other long term (current) drug therapy: Secondary | ICD-10-CM

## 2016-05-13 DIAGNOSIS — E782 Mixed hyperlipidemia: Secondary | ICD-10-CM

## 2016-05-13 DIAGNOSIS — Z1389 Encounter for screening for other disorder: Secondary | ICD-10-CM

## 2016-05-13 DIAGNOSIS — J441 Chronic obstructive pulmonary disease with (acute) exacerbation: Secondary | ICD-10-CM

## 2016-05-13 DIAGNOSIS — R7303 Prediabetes: Secondary | ICD-10-CM

## 2016-05-13 DIAGNOSIS — E559 Vitamin D deficiency, unspecified: Secondary | ICD-10-CM

## 2016-05-13 DIAGNOSIS — Z136 Encounter for screening for cardiovascular disorders: Secondary | ICD-10-CM

## 2016-05-13 DIAGNOSIS — Z23 Encounter for immunization: Secondary | ICD-10-CM

## 2016-05-13 DIAGNOSIS — Z1329 Encounter for screening for other suspected endocrine disorder: Secondary | ICD-10-CM

## 2016-05-13 DIAGNOSIS — Z0001 Encounter for general adult medical examination with abnormal findings: Secondary | ICD-10-CM

## 2016-05-13 DIAGNOSIS — Z130 Encounter for screening for diseases of the blood and blood-forming organs and certain disorders involving the immune mechanism: Secondary | ICD-10-CM

## 2016-05-13 LAB — CBC WITH DIFFERENTIAL/PLATELET
Basophils Absolute: 82 cells/uL (ref 0–200)
Basophils Relative: 1 %
Eosinophils Absolute: 164 cells/uL (ref 15–500)
Eosinophils Relative: 2 %
HCT: 44.6 % (ref 35.0–45.0)
Hemoglobin: 14.6 g/dL (ref 11.7–15.5)
Lymphocytes Relative: 21 %
Lymphs Abs: 1722 cells/uL (ref 850–3900)
MCH: 30.4 pg (ref 27.0–33.0)
MCHC: 32.7 g/dL (ref 32.0–36.0)
MCV: 92.9 fL (ref 80.0–100.0)
MPV: 9.2 fL (ref 7.5–12.5)
Monocytes Absolute: 574 cells/uL (ref 200–950)
Monocytes Relative: 7 %
Neutro Abs: 5658 cells/uL (ref 1500–7800)
Neutrophils Relative %: 69 %
Platelets: 232 10*3/uL (ref 140–400)
RBC: 4.8 MIL/uL (ref 3.80–5.10)
RDW: 13.5 % (ref 11.0–15.0)
WBC: 8.2 10*3/uL (ref 3.8–10.8)

## 2016-05-13 LAB — URINALYSIS, ROUTINE W REFLEX MICROSCOPIC
Bilirubin Urine: NEGATIVE
Glucose, UA: NEGATIVE
Hgb urine dipstick: NEGATIVE
Ketones, ur: NEGATIVE
Leukocytes, UA: NEGATIVE
Nitrite: NEGATIVE
Protein, ur: NEGATIVE
Specific Gravity, Urine: 1.016 (ref 1.001–1.035)
pH: 5.5 (ref 5.0–8.0)

## 2016-05-13 LAB — TSH: TSH: 1.1 mIU/L

## 2016-05-13 LAB — HEMOGLOBIN A1C
Hgb A1c MFr Bld: 5.4 % (ref <5.7)
Mean Plasma Glucose: 108 mg/dL

## 2016-05-13 LAB — VITAMIN B12: Vitamin B-12: 689 pg/mL (ref 200–1100)

## 2016-05-13 MED ORDER — FLUTICASONE FUROATE-VILANTEROL 100-25 MCG/INH IN AEPB: 1.0000 | INHALATION_SPRAY | Freq: Every day | RESPIRATORY_TRACT | 5 refills | Status: DC

## 2016-05-13 NOTE — Progress Notes (Signed)
Complete Physical  Assessment and Plan:   1. Needs flu shot  - Flu vaccine 6-53mopreservative free IM  2. COPD with asthma component -well controlled on breo - fluticasone furoate-vilanterol (BREO ELLIPTA) 100-25 MCG/INH AEPB; Inhale 1 puff into the lungs daily. Rinse mouth with water after each use  Dispense: 1 each; Refill: 5  3. Mixed hyperlipidemia -cont crestor -refill sent - Lipid panel  4. Pre-diabetes -cont diet and exercise - Hemoglobin A1c - Insulin, random  5. Vitamin D deficiency -cont Vit D supplement - VITAMIN D 25 Hydroxy (Vit-D Deficiency, Fractures)  6. Medication management -cont biyearly lab monitoring  7. Encounter for general adult medical examination with abnormal findings  - CBC with Differential/Platelet - BASIC METABOLIC PANEL WITH GFR - Hepatic function panel - Magnesium  8. Screening for deficiency anemia  - Iron and TIBC - Vitamin B12  9. Screening for hematuria or proteinuria  - Urinalysis, [facility] Micro[facility]in / creatinine urine ratio  10. Screening for cardiovascular condition  - EKG 12-Lead  11. Screening for thyroid disorder  - TSH   Discussed med's effects and SE's. Screening labs and tests as requested with regular follow-up as recommended.  HPI  65y.o. female  presents for a complete physical.  Her blood pressure has been controlled at home, today their BP is BP: 132/74.  She does not workout. She denies chest pain, shortness of breath, dizziness. She reports that she is constantly moving and busy, but she has gained some weight recently.    She is on cholesterol medication and denies myalgias. Her cholesterol is at goal. The cholesterol last visit was:  Lab Results  Component Value Date   CHOL 177 05/14/2015   HDL 84 05/14/2015   LDLCALC 81 05/14/2015   TRIG 60 05/14/2015   CHOLHDL 2.1 05/14/2015  .  She has been working on diet and exercise for prediabetes, she is not  on bASA, she is not on ACE/ARB and denies foot ulcerations, hyperglycemia, hypoglycemia , increased appetite, nausea, paresthesia of the feet, polydipsia, polyuria, visual disturbances, vomiting and weight loss. Last A1C in the office was:  Lab Results  Component Value Date   HGBA1C 5.9 (H) 05/14/2015    Patient is on Vitamin D supplement.   Lab Results  Component Value Date   VD25OH 47 05/14/2015     She reports that she is getting her skin checked yearly with dermatology.  No issues.    No changes in family history.  Colonoscopy in 2016 had several polyps and hemorrhoids.  Will be due again in 3-5 years due to polyps and 2 first degree relatives in family history.  No complaints today.    Current Medications:  Current Outpatient Prescriptions on File Prior to Visit  Medication Sig Dispense Refill  . aspirin 81 MG tablet Take 81 mg by mouth daily.    . B Complex-C (SUPER B COMPLEX PO) Take by mouth daily.    . Cholecalciferol (VITAMIN D-3) 5000 UNITS TABS Take by mouth daily.    . Coenzyme Q10 (COQ10) 200 MG CAPS Take 200 mg by mouth daily.    . diphenhydrAMINE (BENADRYL) 25 MG tablet Take 25 mg by mouth every 6 (six) hours as needed.    . fluticasone furoate-vilanterol (BREO ELLIPTA) 100-25 MCG/INH AEPB Inhale 1 puff into the lungs daily. Rinse mouth with water after each use 1 each 5  . Magnesium 300 MG CAPS Take by mouth daily.    .Marland Kitchen  Melatonin 5 MG CAPS Take 2 capsules by mouth at bedtime.     . montelukast (SINGULAIR) 10 MG tablet TAKE 1 TABLET BY MOUTH AT BEDTIME 90 tablet 1  . PROAIR HFA 108 (90 Base) MCG/ACT inhaler INHALE 2 PUFFS INTO THE LUNGS EVERY 6 (SIX) HOURS AS NEEDED FOR WHEEZING OR SHORTNESS OF BREATH. 25.5 Inhaler 2  . Probiotic Product (PROBIOTIC DAILY PO) Take 1 tablet by mouth daily.    . rosuvastatin (CRESTOR) 10 MG tablet TAKE 1 TABLET (10 MG TOTAL) BY MOUTH DAILY. 90 tablet 3  . sertraline (ZOLOFT) 100 MG tablet TAKE 1 TABLET BY MOUTH EVERY DAY 90 tablet 1    No current facility-administered medications on file prior to visit.     Health Maintenance:   Immunization History  Administered Date(s) Administered  . Influenza Split 05/16/2013, 05/12/2014, 05/14/2015  . Influenza, Seasonal, Injecte, Preservative Fre 05/13/2016  . Pneumococcal Polysaccharide-23 06/14/2011  . Tdap 03/27/2010  . Zoster 04/14/2012    Tetanus: 2011 Pneumovax:  2012 Flu vaccine: 2017 Zostavax: 2013 MGM: 2017  BSWH:[id number] Colonoscopy: 2016, due in 3-5 years Last Dental Exam: twice yearly  Last Eye Exam:  2016, due next month  Patient Care Team: [name], MD as PCP - General (Internal Medicine) Lavonna Monarch, MD as Consulting Physician (Dermatology) Troy Sine, MD as Consulting Physician (Cardiology) Thornell Sartorius, MD as Consulting Physician (Otolaryngology) Izora Gala, MD as Consulting Physician (Otolaryngology) Sable Feil, MD as Consulting Physician (Gastroenterology) Delila Pereyra, MD as Consulting Physician (Gynecology) Gatha Mayer, MD as Consulting Physician (Gastroenterology)  Allergies:  Allergies  Allergen Reactions  . Chantix [Varenicline] Anaphylaxis    Makes throat swell up    Medical History:  Past Medical History:  Diagnosis Date  . Acute perforated appendicitis 10/31/2014  . Allergic rhinitis   . Allergy   . Anemia   . Arthritis    "fingers" (10/31/2014)  . Chronic bronchitis ([place])    "got it q yr til I had my nose OR" (10/31/2014)  . Colon polyp   . DDD (degenerative disc disease), cervical   . DDD (degenerative disc disease), lumbar   . Depression    "short term when my sister passed away unexpectedly"  . Family history of adverse reaction to anesthesia    "mother is hard to wake up; a little goes a long way w/her"  . GERD (gastroesophageal reflux disease)   . History of stomach ulcers   . Hyperlipidemia    "borderline; RX is preventative" (10/31/2014)  . Kidney stones    "they passed"  . Migraine     "used to get them alot; don't get them anymore" (10/31/2014)  . Osteoarthritis of both knees   . Osteopenia   . Pneumonia 2003 X 1  . Pre-diabetes   . Seizures ([place]) ~ 1962 X 1   "from an abscessed wisdom tooth"  . Tobacco dependence   . Vitamin D deficiency     Surgical History:  Past Surgical History:  Procedure Laterality Date  . APPENDECTOMY    . ARTHROTOMY Right 01/11/2016   Procedure: DISTAL INTERPHALANGEAL JOINT ARTHROTOMY RIGHT INDEX FINGER;  Surgeon: Leanora Cover, MD;  Location: [facility];  Service: Orthopedics;  Laterality: Right;  . COLONOSCOPY    . CYST EXCISION Right 01/11/2016   Procedure: EXCISION MUCOID CYST;  Surgeon: Leanora Cover, MD;  Location: [facility];  Service: Orthopedics;  Laterality: Right;  . LAPAROSCOPIC APPENDECTOMY N/A 10/31/2014   Procedure: APPENDECTOMY LAPAROSCOPIC;  Surgeon: Alphonsa Overall, MD;  Location: [facility];  Service: General;  Laterality: N/A;  . SALPINGOOPHORECTOMY Right 1999   "w/grapefruit-sized polyp"  . SHOULDER ARTHROSCOPY Right 2003   removal spurs  . SHOULDER ARTHROSCOPY W/ ROTATOR CUFF REPAIR Left 2012  . SINUS EXPLORATION  12/2013   [place]  . TENDON REPAIR Left 10/2009   torn tendon @ elbow  . VAGINAL HYSTERECTOMY  1978    Family History:  Family History  Problem Relation Age of Onset  . Stomach cancer Neg Hx   . Esophageal cancer Neg Hx   . Rectal cancer Neg Hx   . Colon cancer Other 69  . Colon cancer Other 73  . Diabetes Mother   . Cancer Mother     skin  . Seizures Sister   . Cancer Maternal Aunt     breast  . Cancer Paternal Aunt     breast  . Cancer Paternal Uncle     colon  . Stroke Maternal Grandmother   . Hyperlipidemia Maternal Grandfather   . Cancer Paternal Grandmother     breast  . Arthritis Paternal Grandmother     Social History:  Social History  Substance Use Topics  . Smoking status: Current Every Day Smoker    Packs/day: 1.00    Years: 25.00    Types: Cigarettes   . Smokeless tobacco: Never Used     Comment: Pt quit smoking during pregnancies, also 3 yrs ago for 6 mos; "and several years @ times"  . Alcohol use No    Review of Systems: Review of Systems  Constitutional: Negative for chills, fever and malaise/fatigue.  HENT: Negative for congestion, ear pain and sore throat.   Eyes: Negative.   Respiratory: Negative for cough, shortness of breath and wheezing.   Cardiovascular: Negative for chest pain, palpitations and leg swelling.  Gastrointestinal: Negative for abdominal pain, blood in stool, constipation, diarrhea, heartburn and melena.  Genitourinary: Negative.   Skin: Negative.   Neurological: Negative for dizziness, sensory change, loss of consciousness and headaches.  Psychiatric/Behavioral: Negative for depression. The patient is not nervous/anxious and does not have insomnia.     Physical Exam: Estimated body mass index is 27.61 kg/m as calculated from the following:   Height as of this encounter: 5' 4.5" (1.638 m).   Weight as of this encounter: 163 lb 6.4 oz (74.1 kg). BP 132/74   Pulse 83   Temp 97.9 F (36.6 C)   Resp 14   Ht 5' 4.5" (1.638 m)   Wt 163 lb 6.4 oz (74.1 kg)   SpO2 97%   BMI 27.61 kg/m   General Appearance: Well nourished well developed, in no apparent distress.  Eyes: PERRLA, EOMs, conjunctiva no swelling or erythema ENT/Mouth: Ear canals normal without obstruction, swelling, erythema, or discharge.  TMs normal bilaterally with no erythema, bulging, retraction, or loss of landmark.  Oropharynx moist and clear with no exudate, erythema, or swelling.   Neck: Supple, thyroid normal. No bruits.  No cervical adenopathy Respiratory: Respiratory effort normal, Breath sounds clear A&P without wheeze, rhonchi, rales.   Cardio: RRR without murmurs, rubs or gallops. Brisk peripheral pulses without edema.  Chest: symmetric, with normal excursions Breasts: Deferred to obgyn Abdomen: Soft, nontender, no guarding,  rebound, hernias, masses, or organomegaly.  Lymphatics: Non tender without lymphadenopathy.  Genitourinary: Deferred to obgyn Musculoskeletal: Full ROM all peripheral extremities,5/5 strength, and normal gait.  Skin: Warm, dry without rashes, lesions, ecchymosis. Neuro: Awake and oriented X 3, Cranial nerves  intact, reflexes equal bilaterally. Normal muscle tone, no cerebellar symptoms. Sensation intact.  Psych:  normal affect, Insight and Judgment appropriate.   EKG: WNL no changes.  Over 40 minutes of exam, counseling, chart review and critical decision making was performed  Karen Barrera 10:11 AM [facility]

## 2016-05-14 LAB — MICROALBUMIN / CREATININE URINE RATIO
Creatinine, Urine: 91 mg/dL (ref 20–320)
Microalb Creat Ratio: 4 mcg/mg creat (ref <30)
Microalb, Ur: 0.4 mg/dL

## 2016-05-14 LAB — LIPID PANEL
Cholesterol: 194 mg/dL (ref 125–200)
HDL: 86 mg/dL (ref >=46)
LDL Cholesterol: 92 mg/dL (ref <130)
Total CHOL/HDL Ratio: 2.3 Ratio (ref <=5.0)
Triglycerides: 79 mg/dL (ref <150)
VLDL: 16 mg/dL (ref <30)

## 2016-05-14 LAB — BASIC METABOLIC PANEL WITH GFR
BUN: 17 mg/dL (ref 7–25)
CO2: 17 mmol/L — ABNORMAL LOW (ref 20–31)
Calcium: 9.2 mg/dL (ref 8.6–10.4)
Chloride: 107 mmol/L (ref 98–110)
Creat: 0.79 mg/dL (ref 0.50–0.99)
GFR, Est African American: >89 mL/min (ref >=60)
GFR, Est Non African American: 79 mL/min (ref >=60)
Glucose, Bld: 90 mg/dL (ref 65–99)
Potassium: 4.3 mmol/L (ref 3.5–5.3)
Sodium: 138 mmol/L (ref 135–146)

## 2016-05-14 LAB — HEPATIC FUNCTION PANEL
ALT: 14 U/L (ref 6–29)
AST: 17 U/L (ref 10–35)
Albumin: 4.5 g/dL (ref 3.6–5.1)
Alkaline Phosphatase: 74 U/L (ref 33–130)
Bilirubin, Direct: 0.1 mg/dL (ref <=0.2)
Indirect Bilirubin: 0.3 mg/dL (ref 0.2–1.2)
Total Bilirubin: 0.4 mg/dL (ref 0.2–1.2)
Total Protein: 6.7 g/dL (ref 6.1–8.1)

## 2016-05-14 LAB — IRON AND TIBC
%SAT: 18 % (ref 11–50)
Iron: 70 ug/dL (ref 45–160)
TIBC: 384 ug/dL (ref 250–450)
UIBC: 314 ug/dL (ref 125–400)

## 2016-05-14 LAB — MAGNESIUM: Magnesium: 2.2 mg/dL (ref 1.5–2.5)

## 2016-05-14 LAB — INSULIN, RANDOM: Insulin: 7 u[IU]/mL (ref 2.0–19.6)

## 2016-05-14 LAB — VITAMIN D 25 HYDROXY (VIT D DEFICIENCY, FRACTURES): Vit D, 25-Hydroxy: 40 ng/mL (ref 30–100)

## 2016-06-05 ENCOUNTER — Encounter: Payer: Self-pay | Admitting: Internal Medicine

## 2016-06-05 ENCOUNTER — Ambulatory Visit (INDEPENDENT_AMBULATORY_CARE_PROVIDER_SITE_OTHER): Payer: 59 | Admitting: Internal Medicine

## 2016-06-05 VITALS — BP 110/70 | HR 82 | Ht 64.0 in | Wt 162.8 lb

## 2016-06-05 DIAGNOSIS — J449 Chronic obstructive pulmonary disease, unspecified: Secondary | ICD-10-CM

## 2016-06-05 DIAGNOSIS — F17200 Nicotine dependence, unspecified, uncomplicated: Secondary | ICD-10-CM

## 2016-06-05 DIAGNOSIS — Z720 Tobacco use: Secondary | ICD-10-CM

## 2016-06-05 DIAGNOSIS — J3089 Other allergic rhinitis: Secondary | ICD-10-CM

## 2016-06-05 DIAGNOSIS — J302 Other seasonal allergic rhinitis: Secondary | ICD-10-CM

## 2016-06-05 MED ORDER — ALBUTEROL SULFATE (2.5 MG/3ML) 0.083% IN NEBU: 2.5000 mg | INHALATION_SOLUTION | Freq: Four times a day (QID) | RESPIRATORY_TRACT | 12 refills | Status: DC | PRN

## 2016-06-05 NOTE — Patient Instructions (Signed)
Please keep trying to stop smoking now while you can, before something bad happens!  Refill script sent for albuterol nebulizer solution  Order- Schedule PFT     Dx tobacco user  Ok to set [employer] inhaler aside for awhile to see how you feel without it. If you begin to notice more cough, chest tightness, wheeze, shortness of breath, then just start it back.  Please call if we can help

## 2016-06-05 NOTE — Assessment & Plan Note (Signed)
Breath sounds are a little coarse but she is not wheezing or coughing. Available pulmonary functions have been normal but she is at risk for progression of fixed chronic obstructive disease. Not clear now if Breo inhaler is cost effective so we are going to let her stop for observation. Plan-schedule PFT, emphasized smoking cessation

## 2016-06-05 NOTE — Assessment & Plan Note (Signed)
I continue to talk with her. She has not made a personal decision to try quitting.

## 2016-06-05 NOTE — Assessment & Plan Note (Signed)
Seasonal variation but usually antihistamine and sometimes nasal steroid are adequate

## 2016-06-05 NOTE — Progress Notes (Signed)
F smoker chronic allergies.   Allergy Profile 06/16/13 Total IgE 41.7   Pos dust mite, dog, [place] grass, ragweed Office Spirometry 06/13/2015-within normal limits. FVC 3.55/117%, FEV1 2.64/111%, FEV1/FVC 0.74  10/31/13- 65 yo F smoker followed for allergic rhinitis, recurrent acute bronchitis, tobacco use FOLLOWS FOR: Breathing is unchanged since last OV. Having some issues with allergies due to the pollen. Blames pollen for nasal congestion, mild bitemporal headache and dry throat with stuffy nose and postnasal drip. PFT 10/31/2013-within normal limits with no response to bronchodilator. FVC 3.91/120%, FEV1 3.08/123%, FEV1/FEC 0.79, FEF 25-75% 2.90/129%. TLC 113%, DLCO 86%. 6 minute walk test 10/31/2013-94%, 97%, 97%, 480 m. Normal oxygenation.  06/13/15- 65 year old female smoker followed for allergic rhinitis, recurrent acute bronchitis, tobacco use FOLLOWS FOR: Currently being treated for sinus infection-started Avelox this morning and completed round of prednisone yesterday; also taking allegra and Flonase as well. Pt denies any trouble with her breathing at this time.  Chest feels clear. Office Spirometry 06/13/2015-within normal limits. FVC 3.55/117%, FEV1 2.64/111%, FEV1/FVC 0.74 CXR 03/23/15 IMPRESSION: 1. Hyperinflation a mild bronchitic changes. 2. No focal acute pulmonary abnormality. Electronically Signed  By: Nolon Nations M.D.  On: 03/23/2015 12:46  06/05/2016-65 year old female smoker followed for allergic rhinitis, recurrent acute bronchitis, tobacco use FOLLOWS FOR:Pt denies any wheezing, cough, congestion or SOB since last visit 05-2015 Rarely uses rescue inhaler. Continues Singulair daily and continues Breo Ellipta but unclear if [employer] is doing anything.  Uses her nebulizer machine only during exacerbations typically at seasonal holidays like Christmas and Easter, probably when she catches colds from family. We discussed her smoking again.  ROS-see  HPI Constitutional:   No-   weight loss, night sweats, fevers, chills, fatigue, lassitude. HEENT:   +headaches, difficulty swallowing, tooth/dental problems, sore throat,       sneezing,no- itching, ear ache, +nasal congestion, +post nasal drip,  CV:  No-   chest pain, orthopnea, PND, swelling in lower extremities, anasarca,  dizziness, palpitations Resp: No-   shortness of breath with exertion or at rest.             productive cough,  No-non-productive cough,  No- coughing up of blood.              No-   change in color of mucus.  No- wheezing Skin: No-   rash or lesions. GI:  No-   heartburn, indigestion, abdominal pain, nausea, vomiting,  GU:  MS:  No-   joint pain or swelling.   Neuro-     nothing unusual Psych:  No- change in mood or affect. No depression or anxiety.  No memory loss.  OBJ- Physical Exam General- Alert, Oriented, Affect-appropriate, Distress- none acute.+odor of tobacco Skin- rash-none, lesions- none, excoriation- none Lymphadenopathy- none Head- atraumatic            Eyes- Gross vision intact, PERRLA, conjunctivae and secretions clear            Ears- Hearing, canals-normal            Nose- Clear, no-Septal dev, mucus, polyps, erosion, perforation             Throat- Mallampati II , mucosa clear , drainage- none, tonsils- atrophic Neck- flexible , trachea midline, no stridor , thyroid nl, carotid no bruit Chest - symmetrical excursion , unlabored           Heart/CV- RRR , no murmur , no gallop  , no rub, nl s1 s2                           -  JVD- none , edema- none, stasis changes- none, varices- none           Lung- +coarse but clear, wheeze -none, cough- none , dullness-none, rub- none           Chest wall-  Abd-  Br/ Gen/ Rectal- Not done, not indicated Extrem- cyanosis- none, clubbing, none, atrophy- none, strength- nl Neuro- grossly intact to observation

## 2016-06-13 ENCOUNTER — Ambulatory Visit (INDEPENDENT_AMBULATORY_CARE_PROVIDER_SITE_OTHER): Payer: 59 | Admitting: Internal Medicine

## 2016-06-13 DIAGNOSIS — Z720 Tobacco use: Secondary | ICD-10-CM

## 2016-06-13 DIAGNOSIS — R0600 Dyspnea, unspecified: Secondary | ICD-10-CM | POA: Diagnosis not present

## 2016-06-13 LAB — PULMONARY FUNCTION TEST
DL/VA % pred: 79 %
DL/VA: 3.81 ml/min/mmHg/L
DLCO cor % pred: 83 %
DLCO cor: 20.33 ml/min/mmHg
DLCO unc % pred: 84 %
DLCO unc: 20.45 ml/min/mmHg
FEF 25-75 Post: 2.22 L/sec
FEF 25-75 Pre: 2.14 L/sec
FEF2575-%Change-Post: 3 %
FEF2575-%Pred-Post: 104 %
FEF2575-%Pred-Pre: 100 %
FEV1-%Change-Post: 0 %
FEV1-%Pred-Post: 112 %
FEV1-%Pred-Pre: 112 %
FEV1-Post: 2.73 L
FEV1-Pre: 2.72 L
FEV1FVC-%Change-Post: 2 %
FEV1FVC-%Pred-Pre: 97 %
FEV6-%Change-Post: -2 %
FEV6-%Pred-Post: 115 %
FEV6-%Pred-Pre: 118 %
FEV6-Post: 3.51 L
FEV6-Pre: 3.6 L
FEV6FVC-%Change-Post: 0 %
FEV6FVC-%Pred-Post: 103 %
FEV6FVC-%Pred-Pre: 103 %
FVC-%Change-Post: -2 %
FVC-%Pred-Post: 111 %
FVC-%Pred-Pre: 114 %
FVC-Post: 3.53 L
FVC-Pre: 3.61 L
Post FEV1/FVC ratio: 77 %
Post FEV6/FVC ratio: 99 %
Pre FEV1/FVC ratio: 75 %
Pre FEV6/FVC Ratio: 100 %
RV % pred: 98 %
RV: 2.06 L
TLC % pred: 114 %
TLC: 5.78 L

## 2016-06-16 ENCOUNTER — Ambulatory Visit: Payer: 59 | Admitting: Internal Medicine

## 2016-06-22 ENCOUNTER — Other Ambulatory Visit: Payer: Self-pay | Admitting: Internal Medicine

## 2016-06-23 ENCOUNTER — Other Ambulatory Visit: Payer: Self-pay | Admitting: *Deleted

## 2016-06-23 DIAGNOSIS — J441 Chronic obstructive pulmonary disease with (acute) exacerbation: Secondary | ICD-10-CM

## 2016-06-23 MED ORDER — MONTELUKAST SODIUM 10 MG PO TABS: 10.0000 mg | ORAL_TABLET | Freq: Every day | ORAL | 1 refills | Status: DC

## 2016-06-23 MED ORDER — ROSUVASTATIN CALCIUM 10 MG PO TABS
10.0000 mg | ORAL_TABLET | Freq: Every day | ORAL | 1 refills | Status: DC
Start: 2016-06-23 — End: 2016-07-29

## 2016-06-23 MED ORDER — SERTRALINE HCL 100 MG PO TABS: 100.0000 mg | ORAL_TABLET | Freq: Every day | ORAL | 1 refills | Status: DC

## 2016-06-23 MED ORDER — ALBUTEROL SULFATE HFA 108 (90 BASE) MCG/ACT IN AERS: INHALATION_SPRAY | RESPIRATORY_TRACT | 3 refills | Status: DC

## 2016-06-23 MED ORDER — FLUTICASONE FUROATE-VILANTEROL 100-25 MCG/INH IN AEPB: 1.0000 | INHALATION_SPRAY | Freq: Every day | RESPIRATORY_TRACT | 5 refills | Status: DC

## 2016-07-14 ENCOUNTER — Other Ambulatory Visit: Payer: Self-pay | Admitting: Internal Medicine

## 2016-07-29 ENCOUNTER — Other Ambulatory Visit: Payer: Self-pay | Admitting: *Deleted

## 2016-07-29 DIAGNOSIS — J441 Chronic obstructive pulmonary disease with (acute) exacerbation: Secondary | ICD-10-CM

## 2016-07-29 MED ORDER — MONTELUKAST SODIUM 10 MG PO TABS: 10.0000 mg | ORAL_TABLET | Freq: Every day | ORAL | 1 refills | Status: DC

## 2016-07-29 MED ORDER — ROSUVASTATIN CALCIUM 10 MG PO TABS: 10.0000 mg | ORAL_TABLET | Freq: Every day | ORAL | 1 refills | Status: DC

## 2016-07-29 MED ORDER — FLUTICASONE FUROATE-VILANTEROL 100-25 MCG/INH IN AEPB: 1.0000 | INHALATION_SPRAY | Freq: Every day | RESPIRATORY_TRACT | 5 refills | Status: DC

## 2016-07-29 MED ORDER — SERTRALINE HCL 100 MG PO TABS: 100.0000 mg | ORAL_TABLET | Freq: Every day | ORAL | 1 refills | Status: DC

## 2016-08-06 ENCOUNTER — Other Ambulatory Visit: Payer: Self-pay | Admitting: *Deleted

## 2016-08-06 MED ORDER — ALBUTEROL SULFATE HFA 108 (90 BASE) MCG/ACT IN AERS: INHALATION_SPRAY | RESPIRATORY_TRACT | 3 refills | Status: DC

## 2016-08-28 ENCOUNTER — Other Ambulatory Visit: Payer: Self-pay | Admitting: *Deleted

## 2016-08-28 MED ORDER — ALBUTEROL SULFATE HFA 108 (90 BASE) MCG/ACT IN AERS: INHALATION_SPRAY | RESPIRATORY_TRACT | 3 refills | Status: DC

## 2016-10-13 DIAGNOSIS — L821 Other seborrheic keratosis: Secondary | ICD-10-CM | POA: Diagnosis not present

## 2016-10-13 DIAGNOSIS — D2262 Melanocytic nevi of left upper limb, including shoulder: Secondary | ICD-10-CM | POA: Diagnosis not present

## 2016-10-13 DIAGNOSIS — L280 Lichen simplex chronicus: Secondary | ICD-10-CM | POA: Diagnosis not present

## 2016-10-13 DIAGNOSIS — D1801 Hemangioma of skin and subcutaneous tissue: Secondary | ICD-10-CM | POA: Diagnosis not present

## 2016-10-13 DIAGNOSIS — D2261 Melanocytic nevi of right upper limb, including shoulder: Secondary | ICD-10-CM | POA: Diagnosis not present

## 2016-10-13 DIAGNOSIS — L812 Freckles: Secondary | ICD-10-CM | POA: Diagnosis not present

## 2016-11-12 ENCOUNTER — Ambulatory Visit: Payer: Self-pay | Admitting: Physician Assistant

## 2016-11-12 ENCOUNTER — Other Ambulatory Visit: Payer: Medicare Other

## 2016-11-12 ENCOUNTER — Encounter: Payer: Self-pay | Admitting: Physician Assistant

## 2016-11-12 ENCOUNTER — Ambulatory Visit (INDEPENDENT_AMBULATORY_CARE_PROVIDER_SITE_OTHER): Payer: Medicare Other | Admitting: Physician Assistant

## 2016-11-12 ENCOUNTER — Ambulatory Visit: Payer: Self-pay | Admitting: Internal Medicine

## 2016-11-12 ENCOUNTER — Other Ambulatory Visit: Payer: Self-pay | Admitting: Physician Assistant

## 2016-11-12 VITALS — BP 118/72 | HR 71 | Temp 98.1°F | Resp 16 | Ht 64.0 in | Wt 177.0 lb

## 2016-11-12 DIAGNOSIS — I10 Essential (primary) hypertension: Secondary | ICD-10-CM | POA: Diagnosis not present

## 2016-11-12 DIAGNOSIS — Z0000 Encounter for general adult medical examination without abnormal findings: Secondary | ICD-10-CM | POA: Insufficient documentation

## 2016-11-12 DIAGNOSIS — R232 Flushing: Secondary | ICD-10-CM | POA: Diagnosis not present

## 2016-11-12 DIAGNOSIS — Z86010 Personal history of colonic polyps: Secondary | ICD-10-CM

## 2016-11-12 DIAGNOSIS — Z136 Encounter for screening for cardiovascular disorders: Secondary | ICD-10-CM

## 2016-11-12 DIAGNOSIS — R6889 Other general symptoms and signs: Secondary | ICD-10-CM

## 2016-11-12 DIAGNOSIS — D649 Anemia, unspecified: Secondary | ICD-10-CM | POA: Diagnosis not present

## 2016-11-12 DIAGNOSIS — Z79899 Other long term (current) drug therapy: Secondary | ICD-10-CM

## 2016-11-12 DIAGNOSIS — R7303 Prediabetes: Secondary | ICD-10-CM

## 2016-11-12 DIAGNOSIS — Z0001 Encounter for general adult medical examination with abnormal findings: Secondary | ICD-10-CM

## 2016-11-12 DIAGNOSIS — F17200 Nicotine dependence, unspecified, uncomplicated: Secondary | ICD-10-CM

## 2016-11-12 DIAGNOSIS — F3341 Major depressive disorder, recurrent, in partial remission: Secondary | ICD-10-CM

## 2016-11-12 DIAGNOSIS — J3089 Other allergic rhinitis: Secondary | ICD-10-CM

## 2016-11-12 DIAGNOSIS — E782 Mixed hyperlipidemia: Secondary | ICD-10-CM | POA: Diagnosis not present

## 2016-11-12 DIAGNOSIS — Z23 Encounter for immunization: Secondary | ICD-10-CM | POA: Diagnosis not present

## 2016-11-12 DIAGNOSIS — E6609 Other obesity due to excess calories: Secondary | ICD-10-CM

## 2016-11-12 DIAGNOSIS — Z6830 Body mass index (BMI) 30.0-30.9, adult: Secondary | ICD-10-CM

## 2016-11-12 DIAGNOSIS — M940 Chondrocostal junction syndrome [Tietze]: Secondary | ICD-10-CM

## 2016-11-12 DIAGNOSIS — J302 Other seasonal allergic rhinitis: Secondary | ICD-10-CM

## 2016-11-12 DIAGNOSIS — J449 Chronic obstructive pulmonary disease, unspecified: Secondary | ICD-10-CM

## 2016-11-12 DIAGNOSIS — E559 Vitamin D deficiency, unspecified: Secondary | ICD-10-CM

## 2016-11-12 DIAGNOSIS — E669 Obesity, unspecified: Secondary | ICD-10-CM | POA: Insufficient documentation

## 2016-11-12 LAB — CBC WITH DIFFERENTIAL/PLATELET
Basophils Absolute: 72 cells/uL (ref 0–200)
Basophils Relative: 1 %
Eosinophils Absolute: 216 cells/uL (ref 15–500)
Eosinophils Relative: 3 %
HCT: 41.2 % (ref 35.0–45.0)
Hemoglobin: 13.4 g/dL (ref 11.7–15.5)
Lymphocytes Relative: 26 %
Lymphs Abs: 1872 cells/uL (ref 850–3900)
MCH: 29.8 pg (ref 27.0–33.0)
MCHC: 32.5 g/dL (ref 32.0–36.0)
MCV: 91.6 fL (ref 80.0–100.0)
MPV: 9.9 fL (ref 7.5–12.5)
Monocytes Absolute: 648 cells/uL (ref 200–950)
Monocytes Relative: 9 %
Neutro Abs: 4392 cells/uL (ref 1500–7800)
Neutrophils Relative %: 61 %
Platelets: 232 10*3/uL (ref 140–400)
RBC: 4.5 MIL/uL (ref 3.80–5.10)
RDW: 13.7 % (ref 11.0–15.0)
WBC: 7.2 10*3/uL (ref 3.8–10.8)

## 2016-11-12 LAB — BASIC METABOLIC PANEL WITH GFR
BUN: 16 mg/dL (ref 7–25)
CO2: 20 mmol/L (ref 20–31)
Calcium: 9.6 mg/dL (ref 8.6–10.4)
Chloride: 106 mmol/L (ref 98–110)
Creat: 0.75 mg/dL (ref 0.50–0.99)
GFR, Est African American: >89 mL/min (ref >=60)
GFR, Est Non African American: 84 mL/min (ref >=60)
Glucose, Bld: 76 mg/dL (ref 65–99)
Potassium: 4.4 mmol/L (ref 3.5–5.3)
Sodium: 137 mmol/L (ref 135–146)

## 2016-11-12 LAB — LIPID PANEL
Cholesterol: 196 mg/dL (ref <200)
HDL: 77 mg/dL (ref >50)
LDL Cholesterol: 90 mg/dL (ref <100)
Total CHOL/HDL Ratio: 2.5 Ratio (ref <5.0)
Triglycerides: 147 mg/dL (ref <150)
VLDL: 29 mg/dL (ref <30)

## 2016-11-12 LAB — HEPATIC FUNCTION PANEL
ALT: 16 U/L (ref 6–29)
AST: 18 U/L (ref 10–35)
Albumin: 4.4 g/dL (ref 3.6–5.1)
Alkaline Phosphatase: 63 U/L (ref 33–130)
Bilirubin, Direct: 0.1 mg/dL (ref <=0.2)
Indirect Bilirubin: 0.2 mg/dL (ref 0.2–1.2)
Total Bilirubin: 0.3 mg/dL (ref 0.2–1.2)
Total Protein: 6.8 g/dL (ref 6.1–8.1)

## 2016-11-12 LAB — IRON AND TIBC
%SAT: 24 % (ref 11–50)
Iron: 94 ug/dL (ref 45–160)
TIBC: 391 ug/dL (ref 250–450)
UIBC: 297 ug/dL (ref 125–400)

## 2016-11-12 MED ORDER — ESCITALOPRAM OXALATE 20 MG PO TABS: 20.0000 mg | ORAL_TABLET | Freq: Every day | ORAL | 2 refills | Status: DC

## 2016-11-12 NOTE — Progress Notes (Signed)
WELCOME TO MEDICARE ANNUAL WELLNESS VISIT AND 3 MONTH  Assessment:    COPD mixed type ([place]) Advised to stop smoking, will get CXR, continue meds.   Tobacco dependence -     EKG 12-Lead -     [place], RETROPERITNL ABD,  LTD  Pre-diabetes Discussed general issues about diabetes pathophysiology and management., Educational material distributed., Suggested low cholesterol diet., Encouraged aerobic exercise., Discussed foot care., Reminded to get yearly retinal exam. -     TSH -     Hemoglobin A1c  Medication management -     CBC with Differential/Platelet -     BASIC METABOLIC PANEL WITH GFR -     Hepatic function panel -     Magnesium  Mixed hyperlipidemia -continue medications, check lipids, decrease fatty foods, increase activity.  -     TSH -     Lipid panel  Hx of adenomatous colonic polyps  Recurrent major depressive disorder, in partial remission (HCC) -     escitalopram (LEXAPRO) 20 MG tablet; Take 1 tablet (20 mg total) by mouth daily. - continue medications, stress management techniques discussed, increase water, good sleep hygiene discussed, increase exercise, and increase veggies.   Anemia, unspecified type - monitor, continue iron supp with Vitamin C and increase green leafy veggies -     Vitamin B12  Seasonal and perennial allergic rhinitis  Costochondritis  Hot flashes Talk with GYN -     escitalopram (LEXAPRO) 20 MG tablet; Take 1 tablet (20 mg total) by mouth daily.  Class 1 obesity due to excess calories with serious comorbidity and body mass index (BMI) of 30.0 to 30.9 in adult - long discussion about weight loss, diet, and exercise  Vitamin D deficiency Continue supplement  Encounter for Medicare annual wellness exam  Need for vaccination with 13-polyvalent pneumococcal conjugate vaccine -     Pneumococcal conjugate vaccine 13-valent IM   Over 40 minutes of exam, counseling, chart review and critical decision making was performed Future  Appointments Date Time Provider [place]  11/12/2016 10:45 AM Vicie Mutters, PA-C [facility] None  05/14/2017 10:00 AM Starlyn Skeans, PA-C [facility] None     Plan:   During the course of the visit the patient was educated and counseled about appropriate screening and preventive services including:    Pneumococcal vaccine   Prevnar 13  Influenza vaccine  Td vaccine  Screening electrocardiogram  Bone densitometry screening  Colorectal cancer screening  Diabetes screening  Glaucoma screening  Nutrition counseling   Advanced directives: requested   Subjective:  Karen Barrera is a 66 y.o. female who presents for Medicare Annual Wellness Visit and 3 month follow up    Her blood pressure has been controlled at home, today their BP is BP: 118/72 She does not workout. She denies chest pain, shortness of breath, dizziness.  She has COPD, continues to smoke, is on breo and has albuterol PRN, not ready to quit at this time  She is on cholesterol medication and denies myalgias. Her cholesterol is at goal. The cholesterol last visit was:   Lab Results  Component Value Date   CHOL 194 05/13/2016   HDL 86 05/13/2016   LDLCALC 92 05/13/2016   TRIG 79 05/13/2016   CHOLHDL 2.3 05/13/2016   Last A1C  Lab Results  Component Value Date   HGBA1C 5.4 05/13/2016   Last GFR: Lab Results  Component Value Date   GFRNONAA 79 05/13/2016   Patient is on Vitamin D supplement.   Lab Results  Component Value Date   VD25OH 40 05/13/2016     She was on estrogen patches but medicare will no longer cover them so she was switched to pill 0.5 mg from GYN, states it is not working.  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  BMI is Body mass index is 30.38 kg/m., she is working on diet and exercise. Wt Readings from Last 3 Encounters:  11/12/16 177 lb (80.3 kg)   06/05/16 162 lb 12.8 oz (73.8 kg)  05/13/16 163 lb 6.4 oz (74.1 kg)      Medication Review: Current Outpatient Prescriptions on File Prior to Visit  Medication Sig Dispense Refill  . albuterol (PROVENTIL HFA;VENTOLIN HFA) 108 (90 Base) MCG/ACT inhaler Use 2 inhalations 5 minutes apart every 4 hours PRN. 54 g 3  . albuterol (PROVENTIL) (2.5 MG/3ML) 0.083% nebulizer solution Take 3 mLs (2.5 mg total) by nebulization every 6 (six) hours as needed for wheezing or shortness of breath. 75 mL 12  . aspirin 81 MG tablet Take 81 mg by mouth daily.    . B Complex-C (SUPER B COMPLEX PO) Take by mouth daily.    . Cholecalciferol (VITAMIN D-3) 5000 UNITS TABS Take by mouth daily.    . Coenzyme Q10 (COQ10) 200 MG CAPS Take 200 mg by mouth daily.    . fluticasone furoate-vilanterol (BREO ELLIPTA) 100-25 MCG/INH AEPB Inhale 1 puff into the lungs daily. Rinse mouth with water after each use 1 each 5  . Magnesium 300 MG CAPS Take by mouth daily.    . Melatonin 5 MG CAPS Take 2 capsules by mouth at bedtime.     . montelukast (SINGULAIR) 10 MG tablet Take 1 tablet (10 mg total) by mouth at bedtime. 90 tablet 1  . Probiotic Product (PROBIOTIC DAILY PO) Take 1 tablet by mouth daily.    . rosuvastatin (CRESTOR) 10 MG tablet Take 1 tablet (10 mg total) by mouth daily. 90 tablet 1  . sertraline (ZOLOFT) 100 MG tablet Take 1 tablet (100 mg total) by mouth daily. 90 tablet 1   No current facility-administered medications on file prior to visit.     Allergies  Allergen Reactions  . Chantix [Varenicline] Anaphylaxis    Makes throat swell up    Current Problems (verified) Patient Active Problem List   Diagnosis Date Noted  . Hot flashes 11/12/2016  . Obesity 11/12/2016  . Encounter for Medicare annual wellness exam 11/12/2016  . COPD mixed type ([place]) 06/05/2016  . Costochondritis 03/23/2015  . Hx of adenomatous colonic polyps 01/23/2015  . Medication management 11/21/2014  . Depression   .  Hyperlipidemia   . Anemia   . Pre-diabetes   . Vitamin D deficiency   . Tobacco dependence   . Seasonal and perennial allergic rhinitis 07/03/2013    Screening Tests Immunization History  Administered Date(s) Administered  . Influenza Split 05/16/2013, 05/12/2014, 05/14/2015  . Influenza, Seasonal, Injecte, Preservative Fre 05/13/2016  . Pneumococcal Polysaccharide-23 06/14/2011  . Tdap 03/27/2010  . Zoster 04/14/2012   Tetanus: 2011 Pneumovax:  2012 Prevnar 13: DUE Flu vaccine: 2017 Zostavax: 2013  MGM: 2017    at GYN DEXA:2017    At GYN PAP 2016 sees GYN Colonoscopy: 2016, due in 3-5 years PFT 05/2016 CXR 2016  Names of Other Physician/Practitioners you currently use: 1. [facility] here for primary care 2.  Dr. Dorena Cookey, eye doctor, last visit 03/2016, yearly 3. Dr. Luretha Rued, dentist, last visit 2 x a years Patient Care Team: [name], MD as PCP - General (Internal Medicine) Lavonna Monarch, MD as Consulting Physician (Dermatology) Troy Sine, MD as Consulting Physician (Cardiology) Thornell Sartorius, MD as Consulting Physician (Otolaryngology) Izora Gala, MD as Consulting Physician (Otolaryngology) Sable Feil, MD as Consulting Physician (Gastroenterology) Delila Pereyra, MD as Consulting Physician (Gynecology) Gatha Mayer, MD as Consulting Physician (Gastroenterology)  SURGICAL HISTORY She  has a past surgical history that includes Shoulder arthroscopy w/ rotator cuff repair (Left, 2012); Tendon repair (Left, 10/2009); Shoulder arthroscopy (Right, 2003); Salpingoophorectomy (Right, 1999); Sinus exploration (12/2013); Vaginal hysterectomy (1978); laparoscopic appendectomy (N/A, 10/31/2014); Appendectomy; Colonoscopy; Cyst excision (Right, 01/11/2016); and Arthrotomy (Right, 01/11/2016). FAMILY HISTORY Her family history includes Arthritis in her paternal grandmother; Cancer in her maternal aunt, mother, paternal aunt,  paternal grandmother, and paternal uncle; Colon cancer (age of onset: 30) in her other and other; Diabetes in her mother; Hyperlipidemia in her maternal grandfather; Seizures in her sister; Stroke in her maternal grandmother. SOCIAL HISTORY She  reports that she has been smoking Cigarettes.  She has a 25.00 pack-year smoking history. She has never used smokeless tobacco. She reports that she does not drink alcohol or use drugs.   MEDICARE WELLNESS OBJECTIVES: Physical activity: Current Exercise Habits: The patient does not participate in regular exercise at present (but stays busy) Cardiac risk factors: Cardiac Risk Factors include: advanced age (>56mn, >>34women);dyslipidemia;hypertension;sedentary lifestyle;obesity (BMI >30kg/m2);smoking/ tobacco exposure Depression/mood screen:   [facility] 11/12[facility]2016  Decreased Interest 0  Down, Depressed, Hopeless 1  PHQ - 2 Score 1    ADLs:  In your present state of health, do you have any difficulty performing the following activities: 11/12/2016 01/11/2016  Hearing? Y N  Vision? N N  Difficulty concentrating or making decisions? Y N  Walking or climbing stairs? N N  Dressing or bathing? N N  Doing errands, shopping? N -  Some recent data might be hidden     Cognitive Testing  Alert? Yes  Normal Appearance?Yes  Oriented to person? Yes  Place? Yes   Time? Yes  Recall of three objects?  2/3  Can perform simple calculations? Yes  Displays appropriate judgment?Yes  Can read the correct time from a watch face?Yes  EOL planning: Does Patient Have a Medical Advance Directive?: Yes Type of Advance Directive: Healthcare Power of Attorney, Living will Does patient want to make changes to medical advance directive?: No - Patient declined Copy of HHavanain Chart?: No - copy requested  Review of Systems  Constitutional: Negative.   HENT: Negative.   Eyes: Negative.   Respiratory: Negative.   Cardiovascular:  Negative.   Gastrointestinal: Negative for abdominal pain, blood in stool, constipation, diarrhea, heartburn, melena, nausea and vomiting.  Genitourinary: Negative.   Musculoskeletal: Negative.   Skin: Negative.   Neurological: Negative.   Endo/Heme/Allergies: Negative.   Psychiatric/Behavioral: Positive for memory loss. Negative for depression, hallucinations, substance abuse and suicidal ideas. The patient is nervous/anxious. The patient does not have insomnia.      Objective:     Today's Vitals   11/12/16 0953  BP: 118/72  Pulse: 71  Resp: 16  Temp: 98.1 F (36.7 C)  SpO2: 98%  Weight: 177 lb (80.3 kg)  Height: '5\' 4"'$  (1.626 m)   Body mass index is 30.38 kg/m.  General appearance: alert, no distress, WD/WN, female HEENT:  normocephalic, sclerae anicteric, TMs pearly, nares patent, no discharge or erythema, pharynx normal Oral cavity: MMM, no lesions Neck: supple, no lymphadenopathy, no thyromegaly, no masses Heart: RRR, normal S1, S2, no murmurs Lungs: CTA bilaterally, no wheezes, rhonchi, or rales Abdomen: +bs, soft, non tender, non distended, no masses, no hepatomegaly, no splenomegaly Musculoskeletal: nontender, no swelling, no obvious deformity Extremities: no edema, no cyanosis, no clubbing Pulses: 2+ symmetric, upper and lower extremities, normal cap refill Neurological: alert, oriented x 3, CN2-12 intact, strength normal upper extremities and lower extremities, sensation normal throughout, DTRs 2+ throughout, no cerebellar signs, gait normal Psychiatric: normal affect, behavior normal, pleasant   Medicare Attestation I have personally reviewed: The patient's medical and social history Their use of alcohol, tobacco or illicit drugs Their current medications and supplements The patient's functional ability including ADLs,fall risks, home safety risks, cognitive, and hearing and visual impairment Diet and physical activities Evidence for depression or mood  disorders  The patient's weight, height, BMI, and visual acuity have been recorded in the chart.  I have made referrals, counseling, and provided education to the patient based on review of the above and I have provided the patient with a written personalized care plan for preventive services.     Vicie Mutters, PA-C   11/12/2016

## 2016-11-12 NOTE — Addendum Note (Signed)
Addended by: Vicie Mutters R on: 11/12/2016 05:23 PM   Modules accepted: Orders

## 2016-11-12 NOTE — Patient Instructions (Addendum)
Counseling services  Dr. Arbutus Leas, Ph.D. [address], [place] [address] Phone: [phone], [place] [phone] [place] [address], [place] [address]  [facility] Tracie Hampton, NP [place].  [phone] Fax [phone]   [facility] Hours: Monday-Thursday 830-8pm  Friday 830AM-7PM Address: [place] [phone] [phone].  Address: [place], [place] [address] [place] [facility] [phone] office [email or web address] [address], [place], [place], [place] [address]  Rema Fendt, therapist  Toy Cookey, MA, clinical psychologist  Cognitive-Behavior Therapy; Mood Disorders; Anxiety Disorders; adult and child ADHD; Family Therapy; Stress Management; personal growth, and Marital Therapy.    Terrance Mass Ph.D., clinical psychologist Cognitive-Behavior Therapy; Mood Disorders; Anxiety Disorders; Stress     Management  [facility] [address] [place], [place] [address] [phone]   [facility], psychotherapist [address] [place], [place] [address] [phone]  Karin Golden Ph.D., clinical psychologist [phone] office [place], [place] [address] Cognitive Behavior Therapy, Depression, Bipolar, Anxiety, Grief and Loss    [employer] Free Hearing Test with no obligation # [phone] Do not have to be a member Tues-Sat 10-6  [place]- free test with no obligation # [phone] MUST BE A MEMBER Call for store hours  Have had patient's get good cheaper hearing aids from [employer] The air version has good reviews.    Will cut zoloft to 1/2 pill and add lexapro 1/2 pill x 2 weeks, then go lexapro 1 whole pill and stop zoloft.  If any hot flashes, diarrhea, nervous  with it call the office.    Chronic Obstructive Pulmonary Disease Chronic obstructive pulmonary disease (COPD) is a long-term (chronic) lung problem. When you have COPD, it is hard for air to get in and out of your lungs. The way your lungs work will never return to normal. Usually the condition gets worse over time. There are things you can do to keep yourself as healthy as possible. Your doctor may treat your condition with:  Medicines.  Quitting smoking, if you smoke.  Rehabilitation. This may involve a team of specialists.  Oxygen.  Exercise and changes to your diet.  Lung surgery.  Comfort measures (palliative care). Follow these instructions at home: Medicines   Take over-the-counter and prescription medicines only as told by your doctor.  Talk to your doctor before taking any cough or allergy medicines. You may need to avoid medicines that cause your lungs to be dry. Lifestyle   If you smoke, stop. Smoking makes the problem worse. If you need help quitting, ask your doctor.  Avoid being around things that make your breathing worse. This may include smoke, chemicals, and fumes.  Stay active, but remember to also rest.  Learn and use tips on how to relax.  Make sure you get enough sleep. Most adults need at least 7 hours a night.  Eat healthy foods. Eat smaller meals more often. Rest before meals. Controlled breathing   Learn and use tips on how to control your breathing as told by your doctor. Try:  Breathing in (inhaling) through your nose for 1 second. Then, pucker your lips and breath out (exhale) through your lips for 2 seconds.  Putting one hand on your belly (abdomen). Breathe in slowly through your nose for 1 second. Your hand on your  belly should move out. Pucker your lips and breathe out slowly through your lips. Your hand on your belly should move in as you breathe out. Controlled coughing   Learn and use controlled coughing to clear mucus from your  lungs. The steps are: 1. Lean your head a little forward. 2. Breathe in deeply. 3. Try to hold your breath for 3 seconds. 4. Keep your mouth slightly open while coughing 2 times. 5. Spit any mucus out into a tissue. 6. Rest and do the steps again 1 or 2 times as needed. General instructions   Make sure you get all the shots (vaccines) that your doctor recommends. Ask your doctor about a flu shot and a pneumonia shot.  Use oxygen therapy and therapy to help improve your lungs (pulmonary rehabilitation) if told by your doctor. If you need home oxygen therapy, ask your doctor if you should buy a tool to measure your oxygen level (oximeter).  Make a COPD action plan with your doctor. This helps you know what to do if you feel worse than usual.  Manage any other conditions you have as told by your doctor.  Avoid going outside when it is very hot, cold, or humid.  Avoid people who have a sickness you can catch (contagious).  Keep all follow-up visits as told by your doctor. This is important. Contact a doctor if:  You cough up more mucus than usual.  There is a change in the color or thickness of the mucus.  It is harder to breathe than usual.  Your breathing is faster than usual.  You have trouble sleeping.  You need to use your medicines more often than usual.  You have trouble doing your normal activities such as getting dressed or walking around the house. Get help right away if:  You have shortness of breath while resting.  You have shortness of breath that stops you from:  Being able to talk.  Doing normal activities.  Your chest hurts for longer than 5 minutes.  Your skin color is more blue than usual.  Your pulse oximeter shows that you have low oxygen for longer than 5 minutes.  You have a fever.  You feel too tired to breathe normally. Summary  Chronic obstructive pulmonary disease (COPD) is a long-term lung problem.  The way your lungs work will never  return to normal. Usually the condition gets worse over time. There are things you can do to keep yourself as healthy as possible.  Take over-the-counter and prescription medicines only as told by your doctor.  If you smoke, stop. Smoking makes the problem worse. This information is not intended to replace advice given to you by your health care provider. Make sure you discuss any questions you have with your health care provider. Document Released: 12/31/2007 Document Revised: 12/20/2015 Document Reviewed: 03/10/2013 Elsevier Interactive Patient Education  [employer].   If you have a smart phone, please look up [employer], this will help you stay on track and give you information about money you have saved, life that you have gained back and a ton of more information.   We are giving you chantix for smoking cessation. You can do it! And we are here to help! You may have heard some scary side effects about chantix, the three most common I hear about are nausea, crazy dreams and depression.  However, I like for my patients to try to stay on 1/2 a tablet twice a day rather than  one tablet twice a day as normally prescribed. This helps decrease the chances of side effects and helps save money by making a one month prescription last two months  Please start the prescription this way:  Start 1/2 tablet by mouth once daily after food with a full glass of water for 3 days Then do 1/2 tablet by mouth twice daily for 4 days. During this first week you can smoke, but try to stop after this week.  At this point we have several options: 1) continue on 1/2 tablet twice a day- which I encourage you to do. You can stay on this dose the rest of the time on the medication or if you still feel the need to smoke you can do one of the two options below. 2) do one tablet in the morning and 1/2 in the evening which helps decrease dreams. 3) do one tablet twice a day.   What if I miss a dose? If you  miss a dose, take it as soon as you can. If it is almost time for your next dose, take only that dose. Do not take double or extra doses.  What should I watch for while using this medicine? Visit your doctor or health care professional for regular check ups. Ask for ongoing advice and encouragement from your doctor or healthcare professional, friends, and family to help you quit. If you smoke while on this medication, quit again  Your mouth may get dry. Chewing sugarless gum or hard candy, and drinking plenty of water may help. Contact your doctor if the problem does not go away or is severe.  You may get drowsy or dizzy. Do not drive, use machinery, or do anything that needs mental alertness until you know how this medicine affects you. Do not stand or sit up quickly, especially if you are an older patient.   The use of this medicine may increase the chance of suicidal thoughts or actions. Pay special attention to how you are responding while on this medicine. Any worsening of mood, or thoughts of suicide or dying should be reported to your health care professional right away.  ADVANTAGES OF QUITTING SMOKING  Within 20 minutes, blood pressure decreases. Your pulse is at normal level.  After 8 hours, carbon monoxide levels in the blood return to normal. Your oxygen level increases.  After 24 hours, the chance of having a heart attack starts to decrease. Your breath, hair, and body stop smelling like smoke.  After 48 hours, damaged nerve endings begin to recover. Your sense of taste and smell improve.  After 72 hours, the body is virtually free of nicotine. Your bronchial tubes relax and breathing becomes easier.  After 2 to 12 weeks, lungs can hold more air. Exercise becomes easier and circulation improves.  After 1 year, the risk of coronary heart disease is cut in half.  After 5 years, the risk of stroke falls to the same as a nonsmoker.  After 10 years, the risk of lung cancer is cut  in half and the risk of other cancers decreases significantly.  After 15 years, the risk of coronary heart disease drops, usually to the level of a nonsmoker.  You will have extra money to spend on things other than cigarettes.   Simple math prevails.    1st - exercise does not produce significant weight loss - at best one converts fat into muscle , "bulks up", loses inches, but usually stays "weight neutral"  2nd - think of your body weightas a check book: If you eat more calories than you burn up - you save money or gain weight .... Or if you spend more money than you put in the check book, ie burn up more calories than you eat, then you lose weight     3rd - if you walk or run 1 mile, you burn up 100 calories - you have to burn up 3,500 calories to lose 1 pound, ie you have to walk/run 35 miles to lose 1 measly pound. So if you want to lose 10 #, then you have to walk/run 350 miles, so.... clearly exercise is not the solution.     4. So if you consume 1,500 calories, then you have to burn up the equivalent of 15 miles to stay weight neutral - It also stands to reason that if you consume 1,500 cal/day and don't lose weight, then you must be burning up about 1,500 cals/day to stay weight neutral.     5. If you really want to lose weight, you must cut your calorie intake 300 calories /day and at that rate you should lose about 1 # every 3 days.   6. Please purchase Dr Fara Olden Fuhrman's book(s) "The End of Dieting" & "Eat to Live" . It has some great concepts and recipes.

## 2016-11-13 LAB — TSH: TSH: 2.02 mIU/L

## 2016-11-13 LAB — VITAMIN B12: Vitamin B-12: 709 pg/mL (ref 200–1100)

## 2016-11-13 LAB — MAGNESIUM: Magnesium: 2.2 mg/dL (ref 1.5–2.5)

## 2016-11-13 LAB — HEMOGLOBIN A1C
Hgb A1c MFr Bld: 5.4 % (ref <5.7)
Mean Plasma Glucose: 108 mg/dL

## 2016-11-13 NOTE — Progress Notes (Signed)
Pt aware of lab results & voiced understanding of those results.

## 2016-11-14 ENCOUNTER — Other Ambulatory Visit: Payer: Self-pay

## 2016-12-11 ENCOUNTER — Ambulatory Visit (INDEPENDENT_AMBULATORY_CARE_PROVIDER_SITE_OTHER): Payer: Medicare Other | Admitting: Orthopaedic Surgery

## 2016-12-11 DIAGNOSIS — M7701 Medial epicondylitis, right elbow: Secondary | ICD-10-CM | POA: Insufficient documentation

## 2016-12-11 MED ORDER — DICLOFENAC SODIUM 1 % TD GEL: 2.0000 g | Freq: Four times a day (QID) | TRANSDERMAL | 3 refills | Status: DC

## 2016-12-11 MED ORDER — METHYLPREDNISOLONE 4 MG PO TABS: ORAL_TABLET | ORAL | 0 refills | Status: DC

## 2016-12-11 NOTE — Progress Notes (Signed)
Office Visit Note   Patient: Karen Barrera           Date of Birth: Nov 21, 1950           MRN: [id number] Visit Date: 12/11/2016              Requested by: [name], [address] [place] [address] [place], [place] [address] PCP: [name], MD   Assessment & Plan: Visit Diagnoses:  1. Medial epicondylitis of right elbow     Plan: Her clinical exam findings as well as her son and symptoms are consistent with medial epicondylitis. We talked about this diagnosis as well. Offered her steroid injection but she was to try at least stretching exercises that I showed her. We will order a six-day steroid taper with a Medrol Dosepak as well as Voltaren gel and see if this will help. If this is not improved along with stretching and activity modification and these medications I would recommend an injection she has this as well. All questions were encouraged and answered.  Follow-Up Instructions: Return if symptoms worsen or fail to improve.   Orders:  No orders of the defined types were placed in this encounter.  Meds ordered this encounter  Medications  . methylPREDNISolone (MEDROL) 4 MG tablet    Sig: Medrol dose pack. Take as instructed    Dispense:  21 tablet    Refill:  0  . diclofenac sodium (VOLTAREN) 1 % GEL    Sig: Apply 2 g topically 4 (four) times daily.    Dispense:  100 g    Refill:  3      Procedures: No procedures performed   Clinical Data: No additional findings.   Subjective: No chief complaint on file. The patient comes in with new chief complaint today. She has medial elbow pain and points to her right elbow. She denies any injuries. She does do a lot of carrying groceries better lifting with her palm up. She denies any numbness and tingling patient has any neck pain. She has any other illnesses as a relates to keep complaint of right elbow pain it's an irritating type pain. Is starting to actually effect her activities daily living as well. She  denies any weakness in her hand.  HPI  Review of Systems She denies any headache, chest pain, shortness of breath, fever, chills, nausea, vomiting.  Objective: Vital Signs: There were no vitals taken for this visit.  Physical Exam She is alert and oriented 3 and in no acute distress Ortho Exam Examination of her right elbow shows full range of motion of the elbow. There is tenderness over the medial epicondyles only. The elbow is ligamentously stable. She is neurovascular intact. Specialty Comments:  No specialty comments available.  Imaging: No results found.   PMFS History: Patient Active Problem List   Diagnosis Date Noted  . Medial epicondylitis of right elbow 12/11/2016  . Hot flashes 11/12/2016  . Obesity 11/12/2016  . Encounter for Medicare annual wellness exam 11/12/2016  . COPD mixed type ([place]) 06/05/2016  . Costochondritis 03/23/2015  . Hx of adenomatous colonic polyps 01/23/2015  . Medication management 11/21/2014  . Depression   . Hyperlipidemia   . Anemia   . Pre-diabetes   . Vitamin D deficiency   . Tobacco dependence   . Seasonal and perennial allergic rhinitis 07/03/2013   Past Medical History:  Diagnosis Date  . Acute perforated appendicitis 10/31/2014  . Allergic rhinitis   . Allergy   . Anemia   .  Arthritis    "fingers" (10/31/2014)  . Chronic bronchitis ([place])    "got it q yr til I had my nose OR" (10/31/2014)  . Colon polyp   . DDD (degenerative disc disease), cervical   . DDD (degenerative disc disease), lumbar   . Depression    "short term when my sister passed away unexpectedly"  . Family history of adverse reaction to anesthesia    "mother is hard to wake up; a little goes a long way w/her"  . GERD (gastroesophageal reflux disease)   . History of stomach ulcers   . Hyperlipidemia    "borderline; RX is preventative" (10/31/2014)  . Kidney stones    "they passed"  . Migraine    "used to get them alot; don't get them anymore" (10/31/2014)    . Osteoarthritis of both knees   . Osteopenia   . Pneumonia 2003 X 1  . Pre-diabetes   . Seizures ([place]) ~ 1962 X 1   "from an abscessed wisdom tooth"  . Tobacco dependence   . Vitamin D deficiency     Family History  Problem Relation Age of Onset  . Stomach cancer Neg Hx   . Esophageal cancer Neg Hx   . Rectal cancer Neg Hx   . Colon cancer Other 32  . Colon cancer Other 64  . Diabetes Mother   . Cancer Mother        skin  . Seizures Sister   . Cancer Maternal Aunt        breast  . Cancer Paternal Aunt        breast  . Cancer Paternal Uncle        colon  . Stroke Maternal Grandmother   . Hyperlipidemia Maternal Grandfather   . Cancer Paternal Grandmother        breast  . Arthritis Paternal Grandmother     Past Surgical History:  Procedure Laterality Date  . APPENDECTOMY    . ARTHROTOMY Right 01/11/2016   Procedure: DISTAL INTERPHALANGEAL JOINT ARTHROTOMY RIGHT INDEX FINGER;  Surgeon: Leanora Cover, MD;  Location: [facility];  Service: Orthopedics;  Laterality: Right;  . COLONOSCOPY    . CYST EXCISION Right 01/11/2016   Procedure: EXCISION MUCOID CYST;  Surgeon: Leanora Cover, MD;  Location: [facility];  Service: Orthopedics;  Laterality: Right;  . LAPAROSCOPIC APPENDECTOMY N/A 10/31/2014   Procedure: APPENDECTOMY LAPAROSCOPIC;  Surgeon: Alphonsa Overall, MD;  Location: [facility];  Service: General;  Laterality: N/A;  . SALPINGOOPHORECTOMY Right 1999   "w/grapefruit-sized polyp"  . SHOULDER ARTHROSCOPY Right 2003   removal spurs  . SHOULDER ARTHROSCOPY W/ ROTATOR CUFF REPAIR Left 2012  . SINUS EXPLORATION  12/2013   [facility]  . TENDON REPAIR Left 10/2009   torn tendon @ elbow  . VAGINAL HYSTERECTOMY  1978   Social History   Occupational History  . Retired    Social History Main Topics  . Smoking status: Current Every Day Smoker    Packs/day: 1.00    Years: 25.00    Types: Cigarettes  . Smokeless tobacco: Never Used     Comment: Pt quit  smoking during pregnancies, also 3 yrs ago for 6 mos; "and several years @ times"  . Alcohol use No  . Drug use: No  . Sexual activity: Not Currently

## 2016-12-25 ENCOUNTER — Ambulatory Visit (INDEPENDENT_AMBULATORY_CARE_PROVIDER_SITE_OTHER): Payer: Medicare Other | Admitting: Internal Medicine

## 2016-12-25 ENCOUNTER — Encounter: Payer: Self-pay | Admitting: Internal Medicine

## 2016-12-25 VITALS — BP 120/72 | HR 68 | Temp 97.8°F | Resp 16 | Ht 64.0 in | Wt 172.2 lb

## 2016-12-25 DIAGNOSIS — J0410 Acute tracheitis without obstruction: Secondary | ICD-10-CM | POA: Diagnosis not present

## 2016-12-25 MED ORDER — PREDNISONE 20 MG PO TABS: ORAL_TABLET | ORAL | 0 refills | Status: DC

## 2016-12-25 MED ORDER — PROMETHAZINE-CODEINE 6.25-10 MG/5ML PO SYRP: ORAL_SOLUTION | ORAL | 1 refills | Status: AC

## 2016-12-25 MED ORDER — BENZONATATE 200 MG PO CAPS: ORAL_CAPSULE | ORAL | 1 refills | Status: DC

## 2016-12-25 NOTE — Progress Notes (Signed)
Subjective:    Patient ID: Karen Barrera, female    DOB: 07-16-1951, 66 y.o.   MRN: [id number]  HPI  This very nice 66 yo [name] with neg PMH presents with c/o 5-6 days of persistent dry hacking cough. Denies fever, chills, sweats, rash, dyspnea. Tried Robitussin DM w/o avail.   Medication Sig  . albuterol  HFA inhaler Use 2 inhalations 5 minutes apart every 4 hours PRN.  Marland Kitchen albuterol (2.5 MG/3ML) neb soln Take 3 mLs  by neb every 6 (six) hours as needed  . aspirin 81 MG tablet Take 81 mg by mouth daily.  . SUPER B COMPLEX Take by mouth daily.  . cetirizine ) 10 MG tablet Take 10 mg by mouth daily.  Marland Kitchen VITAMIN D 5000 UNITS TABS Take by mouth daily.  . CoQ10 200 MG CAPS Take 200 mg by mouth daily.  . diclofenac1 % GEL Apply 2 g topically 4 (four) times daily.  Marland Kitchen escitalopram20 MG tablet Take 1 tablet (20 mg total) by mouth daily.  Marland Kitchen estradiol0.5 MG tablet Take 0.5 mg by mouth daily.  Marland Kitchen BREO ELLIPTA 100-25  1 puff  daily.  . Magnesium 300 MG CAPS Take by mouth daily.  . Melatonin 5 MG CAPS Take 2 capsules by mouth at bedtime.   . montelukast  10 MG tablet Take 1 tablet (10 mg total) by mouth at bedtime.  . rosuvastatin  10 MG tablet Take 1 tablet (10 mg total) by mouth daily.  . sertraline ( 100 MG tablet Take 1 tablet (100 mg total) by mouth daily.  . Probiotic Take 1 tablet by mouth daily.   Allergies  Allergen Reactions  . Chantix [Varenicline] Anaphylaxis    Makes throat swell up   Past Medical History:  Diagnosis Date  . Acute perforated appendicitis 10/31/2014  . Allergic rhinitis   . Allergy   . Anemia   . Arthritis    "fingers" (10/31/2014)  . Chronic bronchitis ([place])    "got it q yr til I had my nose OR" (10/31/2014)  . Colon polyp   . DDD (degenerative disc disease), cervical   . DDD (degenerative disc disease), lumbar   . Depression    "short term when my sister passed away unexpectedly"  . Family history of adverse reaction to anesthesia    "mother is hard to wake up; a  little goes a long way w/her"  . GERD (gastroesophageal reflux disease)   . History of stomach ulcers   . Hyperlipidemia    "borderline; RX is preventative" (10/31/2014)  . Kidney stones    "they passed"  . Migraine    "used to get them alot; don't get them anymore" (10/31/2014)  . Osteoarthritis of both knees   . Osteopenia   . Pneumonia 2003 X 1  . Pre-diabetes   . Seizures ([place]) ~ 1962 X 1   "from an abscessed wisdom tooth"  . Tobacco dependence   . Vitamin D deficiency    Past Surgical History:  Procedure Laterality Date  . APPENDECTOMY    . ARTHROTOMY Right 01/11/2016   Procedure: DISTAL INTERPHALANGEAL JOINT ARTHROTOMY RIGHT INDEX FINGER;  Surgeon: Leanora Cover, MD;  Location: [facility];  Service: Orthopedics;  Laterality: Right;  . COLONOSCOPY    . CYST EXCISION Right 01/11/2016   Procedure: EXCISION MUCOID CYST;  Surgeon: Leanora Cover, MD;  Location: [facility];  Service: Orthopedics;  Laterality: Right;  . LAPAROSCOPIC APPENDECTOMY N/A 10/31/2014   Procedure: APPENDECTOMY LAPAROSCOPIC;  Surgeon: Alphonsa Overall, MD;  Location: [facility];  Service: General;  Laterality: N/A;  . SALPINGOOPHORECTOMY Right 1999   "w/grapefruit-sized polyp"  . SHOULDER ARTHROSCOPY Right 2003   removal spurs  . SHOULDER ARTHROSCOPY W/ ROTATOR CUFF REPAIR Left 2012  . SINUS EXPLORATION  12/2013   [facility]  . TENDON REPAIR Left 10/2009   torn tendon @ elbow  . VAGINAL HYSTERECTOMY  1978   Review of Systems  10 point systems review negative except as above.    Objective:   Physical Exam   BP 120/72   Pulse 68   Temp 97.8 F (36.6 C)   Resp 16   Ht 5\' 4"  (1.626 m)   Wt 172 lb 3.2 oz (78.1 kg)   BMI 29.56 kg/m   spastic hacking dry barking cough. No respiratory distress  HEENT - Eac's patent. TM's Nl. EOM's full. PERRLA. NasoOroPharynx clear. Neck - supple. Nl Thyroid. Carotids 2+ & No bruits, nodes, JVD Chest - Clear equal BS w/ scattered rales & wheezes and no  rhonchi,  Cor - Nl HS. RRR w/o sig MGR. PP 1(+). MS- FROM w/o deformities. Muscle power, tone and bulk Nl. Gait Nl. Neuro - No obvious Cr N abnormalities. Nl w/o focal abnormalities.    Assessment & Plan:   1. Tracheitis  - predniSONE (DELTASONE) 20 MG tablet; 1 tab 3 x day for 3 days, then 1 tab 2 x day for 3 days, then 1 tab 1 x day for 5 days  Dispense: 20 tablet  - benzonatate (TESSALON) 200 MG capsule; Take 1 perle 3 x/day to prevent cough  Dispense: 30 capsule; Refill: 1  - promethazine-codeine (PHENERGAN WITH CODEINE) 6.25-10 MG/5ML syrup; Take 1 tsp every 4 hour as needed for severe cough  Dispense: 240 mL; Refill: 1  - also Given Rx Z-pak x 1 rf to hold if sputum becomes purulent

## 2017-01-01 ENCOUNTER — Other Ambulatory Visit: Payer: Self-pay | Admitting: Internal Medicine

## 2017-01-01 MED ORDER — ALBUTEROL SULFATE HFA 108 (90 BASE) MCG/ACT IN AERS: INHALATION_SPRAY | RESPIRATORY_TRACT | 3 refills | Status: DC

## 2017-01-29 ENCOUNTER — Ambulatory Visit (INDEPENDENT_AMBULATORY_CARE_PROVIDER_SITE_OTHER): Payer: Medicare Other

## 2017-01-29 ENCOUNTER — Ambulatory Visit (INDEPENDENT_AMBULATORY_CARE_PROVIDER_SITE_OTHER): Payer: Medicare Other | Admitting: Orthopaedic Surgery

## 2017-01-29 DIAGNOSIS — M25571 Pain in right ankle and joints of right foot: Secondary | ICD-10-CM | POA: Diagnosis not present

## 2017-01-29 DIAGNOSIS — M76821 Posterior tibial tendinitis, right leg: Secondary | ICD-10-CM

## 2017-01-29 MED ORDER — METHYLPREDNISOLONE 4 MG PO TABS: ORAL_TABLET | ORAL | 0 refills | Status: DC

## 2017-01-29 NOTE — Progress Notes (Signed)
Office Visit Note   Patient: Karen Barrera           Date of Birth: 1951/01/23           MRN: [id number] Visit Date: 01/29/2017              Requested by: [name], [address] [place], [place] [address] PCP: [name], MD   Assessment & Plan: Visit Diagnoses:  1. Pain in right ankle and joints of right foot   2. Posterior tibial tendinitis, right leg     Plan: Her clinical exam findings are consistent with posterior tibial tendinitis. We will put her on a six-day steroid taper and have her try her Voltaren gel again to this area. She'll work on stretching exercises and icing this area but no heat.  She does have the ability try to wear regular shoes again I think her open back shoes contributed to this. We talked in length about a treatment plan and all questions were encouraged and answered. She'll follow-up as needed. However this is bothering her after a month now I would like to start her on some physical therapy. She'll call as the case.  Follow-Up Instructions: Return if symptoms worsen or fail to improve.   Orders:  Orders Placed This Encounter  Procedures  . XR Ankle Complete Right   Meds ordered this encounter  Medications  . methylPREDNISolone (MEDROL) 4 MG tablet    Sig: Medrol dose pack. Take as instructed    Dispense:  21 tablet    Refill:  0      Procedures: No procedures performed   Clinical Data: No additional findings.   Subjective: No chief complaint on file.  The patient comes in today with a new chief complaint of right ankle pain. She points to the medial aspect her ankles a source of the pain. Is been slowly getting worse over 3 weeks and hurt sometimes weightbearing sometimes not. She denies any specific injuries. She denies any numbness and tingling in her feet. She says is aching type of pain with no burning quality to it at all. HPI  Review of Systems She currently denies any headache, chest pain,  shortness of breath, fever, chills, nausea, vomiting.  Objective: Vital Signs: There were no vitals taken for this visit.  Physical Exam She is alert or 3 and in no acute distress Ortho Exam Examination of her right ankle shows swelling along the course of posterior tibial tendon. There is no pain over the Achilles tendon there is no pain of the medial malleolus. There is no lateral pain. Her ankle motion is full. She can perform a straight toe raise and does show competence of the posterior tibial tendon however it is painful. Specialty Comments:  No specialty comments available.  Imaging: Xr Ankle Complete Right  Result Date: 01/29/2017 3 views of the right ankle AP, mortise, and lateral show no acute findings. The ankles well located.    PMFS History: Patient Active Problem List   Diagnosis Date Noted  . Posterior tibial tendinitis, right leg 01/29/2017  . Medial epicondylitis of right elbow 12/11/2016  . Hot flashes 11/12/2016  . Obesity 11/12/2016  . Encounter for Medicare annual wellness exam 11/12/2016  . COPD mixed type ([place]) 06/05/2016  . Costochondritis 03/23/2015  . Hx of adenomatous colonic polyps 01/23/2015  . Medication management 11/21/2014  . Depression   . Hyperlipidemia   . Anemia   . Pre-diabetes   . Vitamin D deficiency   .  Tobacco dependence   . Seasonal and perennial allergic rhinitis 07/03/2013   Past Medical History:  Diagnosis Date  . Acute perforated appendicitis 10/31/2014  . Allergic rhinitis   . Allergy   . Anemia   . Arthritis    "fingers" (10/31/2014)  . Chronic bronchitis ([place])    "got it q yr til I had my nose OR" (10/31/2014)  . Colon polyp   . DDD (degenerative disc disease), cervical   . DDD (degenerative disc disease), lumbar   . Depression    "short term when my sister passed away unexpectedly"  . Family history of adverse reaction to anesthesia    "mother is hard to wake up; a little goes a long way w/her"  . GERD  (gastroesophageal reflux disease)   . History of stomach ulcers   . Hyperlipidemia    "borderline; RX is preventative" (10/31/2014)  . Kidney stones    "they passed"  . Migraine    "used to get them alot; don't get them anymore" (10/31/2014)  . Osteoarthritis of both knees   . Osteopenia   . Pneumonia 2003 X 1  . Pre-diabetes   . Seizures ([place]) ~ 1962 X 1   "from an abscessed wisdom tooth"  . Tobacco dependence   . Vitamin D deficiency     Family History  Problem Relation Age of Onset  . Stomach cancer Neg Hx   . Esophageal cancer Neg Hx   . Rectal cancer Neg Hx   . Colon cancer Other 29  . Colon cancer Other 70  . Diabetes Mother   . Cancer Mother        skin  . Seizures Sister   . Cancer Maternal Aunt        breast  . Cancer Paternal Aunt        breast  . Cancer Paternal Uncle        colon  . Stroke Maternal Grandmother   . Hyperlipidemia Maternal Grandfather   . Cancer Paternal Grandmother        breast  . Arthritis Paternal Grandmother     Past Surgical History:  Procedure Laterality Date  . APPENDECTOMY    . ARTHROTOMY Right 01/11/2016   Procedure: DISTAL INTERPHALANGEAL JOINT ARTHROTOMY RIGHT INDEX FINGER;  Surgeon: Leanora Cover, MD;  Location: [facility];  Service: Orthopedics;  Laterality: Right;  . COLONOSCOPY    . CYST EXCISION Right 01/11/2016   Procedure: EXCISION MUCOID CYST;  Surgeon: Leanora Cover, MD;  Location: [facility];  Service: Orthopedics;  Laterality: Right;  . LAPAROSCOPIC APPENDECTOMY N/A 10/31/2014   Procedure: APPENDECTOMY LAPAROSCOPIC;  Surgeon: Alphonsa Overall, MD;  Location: [facility];  Service: General;  Laterality: N/A;  . SALPINGOOPHORECTOMY Right 1999   "w/grapefruit-sized polyp"  . SHOULDER ARTHROSCOPY Right 2003   removal spurs  . SHOULDER ARTHROSCOPY W/ ROTATOR CUFF REPAIR Left 2012  . SINUS EXPLORATION  12/2013   [facility]  . TENDON REPAIR Left 10/2009   torn tendon @ elbow  . VAGINAL HYSTERECTOMY  1978    Social History   Occupational History  . Retired    Social History Main Topics  . Smoking status: Current Every Day Smoker    Packs/day: 1.00    Years: 25.00    Types: Cigarettes  . Smokeless tobacco: Never Used     Comment: Pt quit smoking during pregnancies, also 3 yrs ago for 6 mos; "and several years @ times"  . Alcohol use No  . Drug  use: No  . Sexual activity: Not Currently       

## 2017-02-02 ENCOUNTER — Other Ambulatory Visit: Payer: Self-pay | Admitting: Physician Assistant

## 2017-02-02 ENCOUNTER — Other Ambulatory Visit: Payer: Self-pay

## 2017-02-02 DIAGNOSIS — F3341 Major depressive disorder, recurrent, in partial remission: Secondary | ICD-10-CM

## 2017-02-02 DIAGNOSIS — J441 Chronic obstructive pulmonary disease with (acute) exacerbation: Secondary | ICD-10-CM

## 2017-02-02 DIAGNOSIS — R232 Flushing: Secondary | ICD-10-CM

## 2017-02-02 MED ORDER — FLUTICASONE FUROATE-VILANTEROL 100-25 MCG/INH IN AEPB
1.0000 | INHALATION_SPRAY | Freq: Every day | RESPIRATORY_TRACT | 5 refills | Status: DC
Start: 2017-02-02 — End: 2017-08-06

## 2017-02-26 ENCOUNTER — Ambulatory Visit: Payer: Self-pay | Admitting: Physician Assistant

## 2017-03-06 DIAGNOSIS — Z6829 Body mass index (BMI) 29.0-29.9, adult: Secondary | ICD-10-CM | POA: Diagnosis not present

## 2017-03-06 DIAGNOSIS — Z01419 Encounter for gynecological examination (general) (routine) without abnormal findings: Secondary | ICD-10-CM | POA: Diagnosis not present

## 2017-03-06 DIAGNOSIS — Z1231 Encounter for screening mammogram for malignant neoplasm of breast: Secondary | ICD-10-CM | POA: Diagnosis not present

## 2017-03-10 LAB — HM MAMMOGRAPHY

## 2017-03-19 ENCOUNTER — Ambulatory Visit: Payer: 59 | Admitting: Internal Medicine

## 2017-03-20 NOTE — Progress Notes (Signed)
Assessment and Plan:   Hypertension -Continue medication, monitor blood pressure at home. Continue DASH diet.  Reminder to go to the ER if any CP, SOB, nausea, dizziness, severe HA, changes vision/speech, left arm numbness and tingling and jaw pain.  Cholesterol -Continue diet and exercise. Check cholesterol.    Prediabetes  -Continue diet and exercise. Check A1C  Vitamin D Def - check level and continue medications.   Smoking cessation-   instruction/counseling given, counseled patient on the dangers of tobacco use, advised patient to stop smoking, and reviewed strategies to maximize success, patient not ready to quit at this time.   Thrush - recent ABX use- nystatin mouth wash sent in  Continue diet and meds as discussed. Further disposition pending results of labs. Over 30 minutes of exam, counseling, chart review, and critical decision making was performed Future Appointments Date Time Provider [place]  05/25/2017 9:45 AM Gatha Mayer, MD [facility]  06/05/2017 10:30 AM Deneise Lever, MD [facility] None  07/06/2017 3:00 PM Vicie Mutters, PA-C [facility] None    HPI 66 y.o. female  presents for 3 month follow up on hypertension, cholesterol, prediabetes, and vitamin D deficiency.   Her blood pressure has been controlled at home, today their BP is BP: 122/78  She does not workout. She denies chest pain, shortness of breath, dizziness. She has been on prednisone from Dr. Ninfa Linden for right ankle pain/tendonitis.  Has had rectal bleeding, going to see Dr. Carlean Purl for hemorrhoidal bleeding.   She is on cholesterol medication, crestor 10mg  1/2 QD and denies myalgias. Her cholesterol is at goal. The cholesterol last visit was:   Lab Results  Component Value Date   CHOL 196 11/12/2016   HDL 77 11/12/2016   LDLCALC 90 11/12/2016   TRIG 147 11/12/2016   CHOLHDL 2.5 11/12/2016   She has been working on diet and exercise for prediabetes, and denies  paresthesia of the feet, polydipsia, polyuria and visual disturbances. Last A1C in the office was:  Lab Results  Component Value Date   HGBA1C 5.4 11/12/2016  Patient is on Vitamin D supplement.   Lab Results  Component Value Date   VD25OH 40 05/13/2016   Has some anxiety, trouble with memory, she is on lexapro 20mg . She is on estrogen and bASA.  Still smoking, states some issues/stress with her family and not ready to quit.  BMI is Body mass index is 30.04 kg/m., she is working on diet and exercise. Wt Readings from Last 3 Encounters:  03/23/17 175 lb (79.4 kg)  12/25/16 172 lb 3.2 oz (78.1 kg)  11/12/16 177 lb (80.3 kg)    Current Medications:  Current Outpatient Prescriptions on File Prior to Visit  Medication Sig Dispense Refill  . albuterol (PROVENTIL HFA;VENTOLIN HFA) 108 (90 Base) MCG/ACT inhaler 2 inhalations 5 to 10 minutes apart 4 / x day or every 4 hours if needed to rescue Asthma. 48 g 3  . albuterol (PROVENTIL) (2.5 MG/3ML) 0.083% nebulizer solution Take 3 mLs (2.5 mg total) by nebulization every 6 (six) hours as needed for wheezing or shortness of breath. 75 mL 12  . aspirin 81 MG tablet Take 81 mg by mouth daily.    . B Complex-C (SUPER B COMPLEX PO) Take by mouth daily.    . Cholecalciferol (VITAMIN D-3) 5000 UNITS TABS Take by mouth daily.    . Coenzyme Q10 (COQ10) 200 MG CAPS Take 200 mg by mouth daily.    . diclofenac sodium (VOLTAREN) 1 %  GEL Apply 2 g topically 4 (four) times daily. 100 g 3  . escitalopram (LEXAPRO) 20 MG tablet TAKE 1 TABLET BY MOUTH EVERY DAY 30 tablet 2  . estradiol (ESTRACE) 0.5 MG tablet Take 0.5 mg by mouth daily.    . fluticasone furoate-vilanterol (BREO ELLIPTA) 100-25 MCG/INH AEPB Inhale 1 puff into the lungs daily. Rinse mouth with water after each use 3 each 5  . Magnesium 300 MG CAPS Take by mouth daily.    . montelukast (SINGULAIR) 10 MG tablet Take 1 tablet (10 mg total) by mouth at bedtime. 90 tablet 1  . rosuvastatin (CRESTOR) 10  MG tablet Take 1 tablet (10 mg total) by mouth daily. 90 tablet 1   No current facility-administered medications on file prior to visit.    Medical History:  Past Medical History:  Diagnosis Date  . Acute perforated appendicitis 10/31/2014  . Allergic rhinitis   . Allergy   . Anemia   . Arthritis    "fingers" (10/31/2014)  . Chronic bronchitis ([place])    "got it q yr til I had my nose OR" (10/31/2014)  . Colon polyp   . DDD (degenerative disc disease), cervical   . DDD (degenerative disc disease), lumbar   . Depression    "short term when my sister passed away unexpectedly"  . Family history of adverse reaction to anesthesia    "mother is hard to wake up; a little goes a long way w/her"  . GERD (gastroesophageal reflux disease)   . History of stomach ulcers   . Hyperlipidemia    "borderline; RX is preventative" (10/31/2014)  . Kidney stones    "they passed"  . Migraine    "used to get them alot; don't get them anymore" (10/31/2014)  . Osteoarthritis of both knees   . Osteopenia   . Pneumonia 2003 X 1  . Pre-diabetes   . Seizures ([place]) ~ 1962 X 1   "from an abscessed wisdom tooth"  . Tobacco dependence   . Vitamin D deficiency    Allergies:  Allergies  Allergen Reactions  . Chantix [Varenicline] Anaphylaxis    Makes throat swell up     Review of Systems:  Review of Systems  Constitutional: Negative.   HENT: Negative.   Eyes: Negative.   Respiratory: Negative.   Cardiovascular: Negative.   Gastrointestinal: Negative for abdominal pain, blood in stool, constipation, diarrhea, heartburn, melena, nausea and vomiting.  Genitourinary: Negative.   Musculoskeletal: Negative.   Skin: Negative.   Neurological: Negative.   Endo/Heme/Allergies: Negative.   Psychiatric/Behavioral: Negative for depression, hallucinations, memory loss, substance abuse and suicidal ideas. The patient is not nervous/anxious and does not have insomnia.     Family history- Review and unchanged Social  history- Review and unchanged Physical Exam: BP 122/78   Pulse 75   Temp (!) 97.1 F (36.2 C)   Ht 5\' 4"  (1.626 m)   Wt 175 lb (79.4 kg)   SpO2 98%   BMI 30.04 kg/m  Wt Readings from Last 3 Encounters:  03/23/17 175 lb (79.4 kg)  12/25/16 172 lb 3.2 oz (78.1 kg)  11/12/16 177 lb (80.3 kg)   General Appearance: Well nourished, in no apparent distress. Eyes: PERRLA, EOMs, conjunctiva no swelling or erythema Sinuses: No Frontal/maxillary tenderness ENT/Mouth: Ext aud canals clear, TMs without erythema, bulging. No erythema, swelling, or exudate on post pharynx.  Tonsils not swollen or erythematous. Hearing normal.  Neck: Supple, thyroid normal.  Respiratory: Respiratory effort normal, BS equal bilaterally without  rales, rhonchi, wheezing or stridor.  Cardio: RRR with no MRGs. Brisk peripheral pulses without edema.  Abdomen: Soft, + BS, well healing lap scars without signs of infection, Slight tenderness RLQ without guarding/rebound, hernias, masses. Lymphatics: Non tender without lymphadenopathy.  Musculoskeletal: Full ROM, 5/5 strength, Normal gait Skin: Warm, dry without rashes, lesions, ecchymosis.  Neuro: Cranial nerves intact. Normal muscle tone, no cerebellar symptoms. Psych: Awake and oriented X 3, normal affect, Insight and Judgment appropriate.    Vicie Mutters, PA-C 3:18 PM [facility]

## 2017-03-23 ENCOUNTER — Ambulatory Visit (INDEPENDENT_AMBULATORY_CARE_PROVIDER_SITE_OTHER): Payer: Medicare Other | Admitting: Physician Assistant

## 2017-03-23 VITALS — BP 122/78 | HR 75 | Temp 97.1°F | Ht 64.0 in | Wt 175.0 lb

## 2017-03-23 DIAGNOSIS — J449 Chronic obstructive pulmonary disease, unspecified: Secondary | ICD-10-CM | POA: Diagnosis not present

## 2017-03-23 DIAGNOSIS — F3341 Major depressive disorder, recurrent, in partial remission: Secondary | ICD-10-CM | POA: Diagnosis not present

## 2017-03-23 DIAGNOSIS — B370 Candidal stomatitis: Secondary | ICD-10-CM | POA: Diagnosis not present

## 2017-03-23 DIAGNOSIS — Z6830 Body mass index (BMI) 30.0-30.9, adult: Secondary | ICD-10-CM

## 2017-03-23 DIAGNOSIS — R232 Flushing: Secondary | ICD-10-CM

## 2017-03-23 DIAGNOSIS — F17200 Nicotine dependence, unspecified, uncomplicated: Secondary | ICD-10-CM

## 2017-03-23 DIAGNOSIS — E782 Mixed hyperlipidemia: Secondary | ICD-10-CM | POA: Diagnosis not present

## 2017-03-23 DIAGNOSIS — E6609 Other obesity due to excess calories: Secondary | ICD-10-CM

## 2017-03-23 DIAGNOSIS — Z79899 Other long term (current) drug therapy: Secondary | ICD-10-CM

## 2017-03-23 MED ORDER — ESCITALOPRAM OXALATE 20 MG PO TABS: 20.0000 mg | ORAL_TABLET | Freq: Every day | ORAL | 1 refills | Status: DC

## 2017-03-23 MED ORDER — NYSTATIN 100000 UNIT/ML MT SUSP: OROMUCOSAL | 0 refills | Status: DC

## 2017-03-23 NOTE — Patient Instructions (Signed)
Oral Thrush, Adult Oral thrush, also called oral candidiasis, is a fungal infection that develops in the mouth and throat and on the tongue. It causes white patches to form on the mouth and tongue. Thrush is most common in older adults, but it can occur at any age. Many cases of thrush are mild, but this infection can also be serious. Thrush can be a repeated (recurrent) problem for certain people who have a weak body defense system (immune system). The weakness can be caused by chronic illnesses, or by taking medicines that limit the body's ability to fight infection. If a person has difficulty fighting infection, the fungus that causes thrush can spread through the body. This can cause life-threatening blood or organ infections. What are the causes? This condition is caused by a fungus (yeast) called Candida albicans.  This fungus is normally present in small amounts in the mouth and on other mucous membranes. It usually causes no harm.  If conditions are present that allow the fungus to grow without control, it invades surrounding tissues and becomes an infection.  Other Candida species can also lead to thrush (rare).  What increases the risk? This condition is more likely to develop in:  People with a weakened immune system.  Older adults.  People with HIV (human immunodeficiency virus).  People with diabetes.  People with dry mouth (xerostomia).  Pregnant women.  People with poor dental care, especially people who have false teeth.  People who use antibiotic medicines.  What are the signs or symptoms? Symptoms of this condition can vary from mild and moderate to severe and persistent. Symptoms may include:  A burning feeling in the mouth and throat. This can occur at the start of a thrush infection.  White patches that stick to the mouth and tongue. The tissue around the patches may be red, raw, and painful. If rubbed (during tooth brushing, for example), the patches and the  tissue of the mouth may bleed easily.  A bad taste in the mouth or difficulty tasting foods.  A cottony feeling in the mouth.  Pain during eating and swallowing.  Poor appetite.  Cracking at the corners of the mouth.  How is this diagnosed? This condition is diagnosed based on:  Physical exam. Your health care provider will look in your mouth.  Health history. Your health care provider will ask you questions about your health.  How is this treated? This condition is treated with medicines called antifungals, which prevent the growth of fungi. These medicines are either applied directly to the affected area (topical) or swallowed (oral). The treatment will depend on the severity of the condition. Mild thrush Mild cases of thrush may clear up with the use of an antifungal mouth rinse or lozenges. Treatment usually lasts about 14 days. Moderate to severe thrush  More severe thrush infections that have spread to the esophagus are treated with an oral antifungal medicine. A topical antifungal medicine may also be used.  For some severe infections, treatment may need to continue for more than 14 days.  Oral antifungal medicines are rarely used during pregnancy because they may be harmful to the unborn child. If you are pregnant, talk with your health care provider about options for treatment. Persistent or recurrent thrush For cases of thrush that do not go away or keep coming back:  Treatment may be needed twice as long as the symptoms last.  Treatment will include both oral and topical antifungal medicines.  People with a weakened immune   system can take an antifungal medicine on a continuous basis to prevent thrush infections.  It is important to treat conditions that make a person more likely to get thrush, such as diabetes or HIV. Follow these instructions at home: Medicines  Take over-the-counter and prescription medicines only as told by your health care provider.  Talk  with your health care provider about an over-the-counter medicine called gentian violet, which kills bacteria and fungi. Relieving soreness and discomfort To help reduce the discomfort of thrush:  Drink cold liquids such as water or iced tea.  Try flavored ice treats or frozen juices.  Eat foods that are easy to swallow, such as gelatin, ice cream, or custard.  Try drinking from a straw if the patches in your mouth are painful.  General instructions  Eat plain, unflavored yogurt as directed by your health care provider. Check the label to make sure the yogurt contains live cultures. This yogurt can help healthy bacteria to grow in the mouth and can stop the growth of the fungus that causes thrush.  If you wear dentures, remove the dentures before going to bed, brush them vigorously, and soak them in a cleaning solution as directed by your health care provider.  Rinse your mouth with a warm salt-water mixture several times a day. To make a salt-water mixture, completely dissolve 1/2-1 tsp of salt in 1 cup of warm water. Contact a health care provider if:  Your symptoms are getting worse or are not improving within 7 days of starting treatment.  You have symptoms of a spreading infection, such as white patches on the skin outside of the mouth. This information is not intended to replace advice given to you by your health care provider. Make sure you discuss any questions you have with your health care provider. Document Released: 04/08/2004 Document Revised: 04/07/2016 Document Reviewed: 04/07/2016 Elsevier Interactive Patient Education  2017 [employer]  

## 2017-03-24 NOTE — Progress Notes (Signed)
Pt aware of lab results & voiced understanding of those results.

## 2017-03-25 ENCOUNTER — Ambulatory Visit (INDEPENDENT_AMBULATORY_CARE_PROVIDER_SITE_OTHER): Payer: Medicare Other | Admitting: Orthopaedic Surgery

## 2017-03-25 DIAGNOSIS — M76821 Posterior tibial tendinitis, right leg: Secondary | ICD-10-CM | POA: Diagnosis not present

## 2017-03-25 NOTE — Progress Notes (Signed)
The patient is well-known to me. I seen her before for her right ankle medial swelling. I've diagnosed her with posterior tibial tendinitis. She still been wearing more open back shoes and she's been trying Voltaren gel on this area 3 times a day. She is medial ankle swelling does go down overnight.  On exam there is swelling over the medial malleolus and around the posterior tibial tendon area. X-rays in the past show known fracture in this area well located ankle. She is able couple her toes easily on both sides and even hold dispositional just her right foot with her toes up. Her heel position appears normal as well.  This is still likely a posterior tibial tendinitis. I would like to send her to physical therapy at this standpoint for modalities they can hopefully decrease the edema and pain associated with this process. She is issued and trying physical therapy as well. I'll see her back in 4 weeks to see how therapy is done

## 2017-03-26 ENCOUNTER — Encounter: Payer: Self-pay | Admitting: Genetics

## 2017-03-26 ENCOUNTER — Telehealth: Payer: Self-pay | Admitting: Genetics

## 2017-03-26 LAB — CBC WITH DIFFERENTIAL/PLATELET
Basophils Absolute: 83 cells/uL (ref 0–200)
Basophils Relative: 1 %
Eosinophils Absolute: 133 cells/uL (ref 15–500)
Eosinophils Relative: 1.6 %
HCT: 42.1 % (ref 35.0–45.0)
Hemoglobin: 14.5 g/dL (ref 11.7–15.5)
Lymphs Abs: 2224 cells/uL (ref 850–3900)
MCH: 30.9 pg (ref 27.0–33.0)
MCHC: 34.4 g/dL (ref 32.0–36.0)
MCV: 89.8 fL (ref 80.0–100.0)
MPV: 10.6 fL (ref 7.5–12.5)
Monocytes Relative: 7.5 %
Neutro Abs: 5237 cells/uL (ref 1500–7800)
Neutrophils Relative %: 63.1 %
Platelets: 224 10*3/uL (ref 140–400)
RBC: 4.69 10*6/uL (ref 3.80–5.10)
RDW: 13.2 % (ref 11.0–15.0)
Total Lymphocyte: 26.8 %
WBC mixed population: 623 cells/uL (ref 200–950)
WBC: 8.3 10*3/uL (ref 3.8–10.8)

## 2017-03-26 LAB — HEPATIC FUNCTION PANEL
AG Ratio: 2 (calc) (ref 1.0–2.5)
ALT: 17 U/L (ref 6–29)
AST: 18 U/L (ref 10–35)
Albumin: 4.6 g/dL (ref 3.6–5.1)
Alkaline phosphatase (APISO): 55 U/L (ref 33–130)
Bilirubin, Direct: 0.1 mg/dL (ref 0.0–0.2)
Globulin: 2.3 g/dL (calc) (ref 1.9–3.7)
Indirect Bilirubin: 0.3 mg/dL (calc) (ref 0.2–1.2)
Total Bilirubin: 0.4 mg/dL (ref 0.2–1.2)
Total Protein: 6.9 g/dL (ref 6.1–8.1)

## 2017-03-26 LAB — BASIC METABOLIC PANEL WITH GFR
BUN: 14 mg/dL (ref 7–25)
CO2: 27 mmol/L (ref 20–32)
Calcium: 9.6 mg/dL (ref 8.6–10.4)
Chloride: 104 mmol/L (ref 98–110)
Creat: 0.84 mg/dL (ref 0.50–0.99)
GFR, Est African American: 85 mL/min/{1.73_m2} (ref >=60)
GFR, Est Non African American: 73 mL/min/{1.73_m2} (ref >=60)
Glucose, Bld: 92 mg/dL (ref 65–99)
Potassium: 3.8 mmol/L (ref 3.5–5.3)
Sodium: 140 mmol/L (ref 135–146)

## 2017-03-26 LAB — LIPID PANEL
Cholesterol: 197 mg/dL (ref <200)
HDL: 79 mg/dL (ref >50)
LDL Cholesterol (Calc): 92 mg/dL (calc)
Non-HDL Cholesterol (Calc): 118 mg/dL (calc) (ref <130)
Total CHOL/HDL Ratio: 2.5 (calc) (ref <5.0)
Triglycerides: 164 mg/dL — ABNORMAL HIGH (ref <150)

## 2017-03-26 LAB — MAGNESIUM: Magnesium: 2.2 mg/dL (ref 1.5–2.5)

## 2017-03-26 LAB — TSH: TSH: 1.2 mIU/L (ref 0.40–4.50)

## 2017-03-26 NOTE — Telephone Encounter (Signed)
Genetic counseling appt scheduled for the pt to see Ferol Luz on 9/18 at 10am. Letter mailed to the pt and faxed to the referring.

## 2017-03-27 ENCOUNTER — Other Ambulatory Visit (INDEPENDENT_AMBULATORY_CARE_PROVIDER_SITE_OTHER): Payer: Self-pay

## 2017-03-27 DIAGNOSIS — M7751 Other enthesopathy of right foot: Secondary | ICD-10-CM

## 2017-04-03 ENCOUNTER — Ambulatory Visit: Payer: Medicare Other | Attending: Orthopaedic Surgery | Admitting: Physical Therapy

## 2017-04-03 DIAGNOSIS — R262 Difficulty in walking, not elsewhere classified: Secondary | ICD-10-CM | POA: Diagnosis not present

## 2017-04-03 DIAGNOSIS — M25571 Pain in right ankle and joints of right foot: Secondary | ICD-10-CM | POA: Diagnosis not present

## 2017-04-03 DIAGNOSIS — M6281 Muscle weakness (generalized): Secondary | ICD-10-CM | POA: Diagnosis not present

## 2017-04-03 NOTE — Therapy (Signed)
[place], [place], [address] Phone: [phone]   Fax:  [phone]  Physical Therapy Evaluation  Patient Details  Name: Karen Barrera MRN: [id number] Date of Birth: 07-15-1951 Referring Provider: Mcarthur Rossetti, MD  Encounter Date: 04/03/2017      PT End of Session - 04/03/17 1012    Visit Number 1   Number of Visits 13   Date for PT Re-Evaluation 05/15/17   Authorization Type MCR- KX at visit 15   PT Start Time 0930   PT Stop Time 1020   PT Time Calculation (min) 50 min   Activity Tolerance Patient tolerated treatment well   Behavior During Therapy [facility] for tasks assessed/performed      Past Medical History:  Diagnosis Date  . Acute perforated appendicitis 10/31/2014  . Allergic rhinitis   . Allergy   . Anemia   . Arthritis    "fingers" (10/31/2014)  . Chronic bronchitis ([place])    "got it q yr til I had my nose OR" (10/31/2014)  . Colon polyp   . DDD (degenerative disc disease), cervical   . DDD (degenerative disc disease), lumbar   . Depression    "short term when my sister passed away unexpectedly"  . Family history of adverse reaction to anesthesia    "mother is hard to wake up; a little goes a long way w/her"  . GERD (gastroesophageal reflux disease)   . History of stomach ulcers   . Hyperlipidemia    "borderline; RX is preventative" (10/31/2014)  . Kidney stones    "they passed"  . Migraine    "used to get them alot; don't get them anymore" (10/31/2014)  . Osteoarthritis of both knees   . Osteopenia   . Pneumonia 2003 X 1  . Pre-diabetes   . Seizures ([place]) ~ 1962 X 1   "from an abscessed wisdom tooth"  . Tobacco dependence   . Vitamin D deficiency     Past Surgical History:  Procedure Laterality Date  . APPENDECTOMY    . ARTHROTOMY Right 01/11/2016   Procedure: DISTAL INTERPHALANGEAL JOINT ARTHROTOMY RIGHT INDEX FINGER;  Surgeon: Leanora Cover, MD;  Location: [facility];  Service: Orthopedics;  Laterality: Right;  . COLONOSCOPY    . CYST EXCISION Right 01/11/2016   Procedure: EXCISION MUCOID CYST;  Surgeon: Leanora Cover, MD;  Location: [facility];  Service: Orthopedics;  Laterality: Right;  . LAPAROSCOPIC APPENDECTOMY N/A 10/31/2014   Procedure: APPENDECTOMY LAPAROSCOPIC;  Surgeon: Alphonsa Overall, MD;  Location: [facility];  Service: General;  Laterality: N/A;  . SALPINGOOPHORECTOMY Right 1999   "w/grapefruit-sized polyp"  . SHOULDER ARTHROSCOPY Right 2003   removal spurs  . SHOULDER ARTHROSCOPY W/ ROTATOR CUFF REPAIR Left 2012  . SINUS EXPLORATION  12/2013   [place]  . TENDON REPAIR Left 10/2009   torn tendon @ elbow  . VAGINAL HYSTERECTOMY  1978    There were no vitals filed for this visit.       Subjective Assessment - 04/03/17 0934    Subjective R medial ankle pain that has started running up the front and back of her leg. Feels like somebody is sticking 1000 pins in it. Pain began a couple of months ago (late June) of insidious onset. Usually wears flip flops or clark sandals. Reports swelling in ankle. Denies improved comfort with shoes v flip flops.    How long can you walk comfortably? unable other than first 30 min in  the morning   Currently in Pain? Yes   Pain Score 6    Pain Location Ankle   Pain Orientation Right;Medial   Pain Descriptors / Indicators Pins and needles   Pain Type Acute pain   Pain Onset More than a month ago   Aggravating Factors  weight bearing, continuous pain   Pain Relieving Factors icy hot, voltaren gel, ibuprofen            [facility] PT Assessment - 04/03/17 0001      Assessment   Medical Diagnosis R post tib tendonitis   Referring Provider Mcarthur Rossetti, MD   Onset Date/Surgical Date --  June, 2018   Hand Dominance Right   Next MD Visit --  4 weeks   Prior Therapy not this year     Precautions   Precautions None     Restrictions   Weight Bearing Restrictions No      Balance Screen   Has the patient fallen in the past 6 months No     [place] residence   Additional Comments 4 steps to enter home     Prior Function   Level of Independence Independent     Cognition   Overall Cognitive Status Within Functional Limits for tasks assessed     Observation/Other Assessments   Focus on Therapeutic Outcomes (FOTO)  53% limitation     Sensation   Additional Comments tingling- medial great toe     ROM / Strength   AROM / PROM / Strength Strength;AROM     AROM   AROM Assessment Site Ankle   Right/Left Ankle Right   Right Ankle Dorsiflexion 0   Right Ankle Plantar Flexion 30   Right Ankle Inversion 20   Right Ankle Eversion 20     Strength   Strength Assessment Site Ankle;Hip   Right/Left Hip Right   Right/Left Ankle Right   Right Ankle Dorsiflexion 5/5   Right Ankle Plantar Flexion 5/5   Right Ankle Inversion 5/5   Right Ankle Eversion 4/5     Palpation   Palpation comment TTP inferior medial maleolus            Objective measurements completed on examination: See above findings.          [facility] Adult PT Treatment/Exercise - 04/03/17 0001      Exercises   Exercises Ankle     Modalities   Modalities Cryotherapy     Cryotherapy   Number Minutes Cryotherapy 10 Minutes  2 min with edu   Cryotherapy Location Ankle  R   Type of Cryotherapy Ice pack     Manual Therapy   Manual Therapy Taping   Kinesiotex Facilitate Muscle     Kinesiotix   Facilitate Muscle  along post tib to reduce ER     Ankle Exercises: Stretches   Other Stretch great toe extension     Ankle Exercises: Seated   Heel Raises Limitations pain free ROM in straight line   Other Seated Ankle Exercises toe yoga                PT Education - 04/03/17 1209    Education provided Yes   Education Details anatomy of condition, POC, HEP, exercise form/rationale, shoe wear, ASO   Person(s) Educated Patient    Methods Explanation;Demonstration;Tactile cues;Verbal cues;Handout   Comprehension Verbalized understanding;Returned demonstration;Verbal cues required;Tactile cues required;Need further instruction  PT Long Term Goals - 04/03/17 1206      PT LONG TERM GOAL #1   Title FOTO to 34% limitation to indicate significant improvement in functional ability   Baseline 53% limitation at eval   Time 6   Period Weeks   Status New   Target Date 05/15/17     PT LONG TERM GOAL #2   Title Pt will be able to walk as needed to run errands and complete household chores ankle pain <=2/10   Baseline 6/10 at eval   Time 6   Period Weeks   Status New   Target Date 05/15/17     PT LONG TERM GOAL #3   Title Gross LE MMT 5/5 to provide necessary support to LE biomechanical chain   Baseline see flowsheet   Time 6   Period Weeks   Status New   Target Date 05/15/17     PT LONG TERM GOAL #4   Title Pt will be independent with long term HEP for continued care/strengthening   Baseline will progress as appropraite   Time 6   Period Weeks   Status New   Target Date 05/15/17                Plan - 04/03/17 1013    Clinical Impression Statement Pt presents to PT with complaints of R ankle pain that began in late June of this year. Notable turnout in foot with arch collapse otherwise with high arches. Limited DF ROM requiring compensation from inversion/eversion in gait. Discussed being fitted for shoes with appropraite arch support as well as use of ASO until pain decreases. Pt will benefit from skilled PT to decrease irritation along post tib and improve LE chain biomechanics.    History and Personal Factors relevant to plan of care: osteopenia, depression   Clinical Presentation Stable   Clinical Presentation due to: n/a   Clinical Decision Making Low   Rehab Potential Good   PT Frequency 2x / week   PT Duration 6 weeks   PT Treatment/Interventions ADLs/Self Care Home  Management;Cryotherapy;Electrical Stimulation;Iontophoresis 4mg /ml Dexamethasone;Functional mobility training;Stair training;Gait training;Ultrasound;Traction;Moist Heat;Therapeutic activities;Therapeutic exercise;Balance training;Neuromuscular re-education;Patient/family education;Passive range of motion;Manual techniques;Dry needling;Taping   PT Next Visit Plan hip MMMT, foot intrinsics, [place] or ionto if plan is signed   PT Home Exercise Plan heel raises, toe yoga, great toe ext stretch.    Consulted and Agree with Plan of Care Patient      Patient will benefit from skilled therapeutic intervention in order to improve the following deficits and impairments:  Abnormal gait, Decreased range of motion, Difficulty walking, Increased muscle spasms, Decreased activity tolerance, Pain, Improper body mechanics, Impaired flexibility, Decreased balance, Decreased strength, Impaired sensation, Postural dysfunction  Visit Diagnosis: Pain in right ankle and joints of right foot - Plan: PT plan of care cert/re-cert  Difficulty in walking, not elsewhere classified - Plan: PT plan of care cert/re-cert  Muscle weakness (generalized) - Plan: PT plan of care cert/re-cert      G-Codes - 09/62/83 1210    Functional Assessment Tool Used (Outpatient Only) FOTO 54% limitation, clinical judgement   Functional Limitation Mobility: Walking and moving around   Mobility: Walking and Moving Around Current Status (M6294) At least 40 percent but less than 60 percent impaired, limited or restricted   Mobility: Walking and Moving Around Goal Status (T6546) At least 20 percent but less than 40 percent impaired, limited or restricted       Problem List Patient Active  Problem List   Diagnosis Date Noted  . Posterior tibial tendinitis, right leg 01/29/2017  . Medial epicondylitis of right elbow 12/11/2016  . Hot flashes 11/12/2016  . Obesity 11/12/2016  . Encounter for Medicare annual wellness exam 11/12/2016  . COPD  mixed type ([place]) 06/05/2016  . Costochondritis 03/23/2015  . Hx of adenomatous colonic polyps 01/23/2015  . Medication management 11/21/2014  . Depression   . Hyperlipidemia   . Anemia   . Pre-diabetes   . Vitamin D deficiency   . Tobacco dependence   . Seasonal and perennial allergic rhinitis 07/03/2013    Allani Reber C. Damonta Cossey PT, DPT 04/03/17 12:12 PM   [place], [place], [address] Phone: [phone]   Fax:  [phone]  Name: Karen Barrera MRN: [id number] Date of Birth: 28-Mar-1951

## 2017-04-07 ENCOUNTER — Encounter: Payer: Self-pay | Admitting: Genetic Counselor

## 2017-04-07 ENCOUNTER — Ambulatory Visit: Payer: Medicare Other | Admitting: Physical Therapy

## 2017-04-07 DIAGNOSIS — R262 Difficulty in walking, not elsewhere classified: Secondary | ICD-10-CM

## 2017-04-07 DIAGNOSIS — M25571 Pain in right ankle and joints of right foot: Secondary | ICD-10-CM

## 2017-04-07 DIAGNOSIS — M6281 Muscle weakness (generalized): Secondary | ICD-10-CM

## 2017-04-07 NOTE — Therapy (Signed)
[place], [place], [address] Phone: [phone]   Fax:  [phone]  Physical Therapy Treatment  Patient Details  Name: Karen Barrera MRN: [id number] Date of Birth: 03-09-1951 Referring Provider: Mcarthur Rossetti, MD  Encounter Date: 04/07/2017      PT End of Session - 04/07/17 1657    Visit Number 2   Number of Visits 13   Date for PT Re-Evaluation 05/15/17   Authorization Type MCR- KX at visit 15   PT Start Time 0130   PT Stop Time 0215   PT Time Calculation (min) 45 min      Past Medical History:  Diagnosis Date  . Acute perforated appendicitis 10/31/2014  . Allergic rhinitis   . Allergy   . Anemia   . Arthritis    "fingers" (10/31/2014)  . Chronic bronchitis ([place])    "got it q yr til I had my nose OR" (10/31/2014)  . Colon polyp   . DDD (degenerative disc disease), cervical   . DDD (degenerative disc disease), lumbar   . Depression    "short term when my sister passed away unexpectedly"  . Family history of adverse reaction to anesthesia    "mother is hard to wake up; a little goes a long way w/her"  . GERD (gastroesophageal reflux disease)   . History of stomach ulcers   . Hyperlipidemia    "borderline; RX is preventative" (10/31/2014)  . Kidney stones    "they passed"  . Migraine    "used to get them alot; don't get them anymore" (10/31/2014)  . Osteoarthritis of both knees   . Osteopenia   . Pneumonia 2003 X 1  . Pre-diabetes   . Seizures ([place]) ~ 1962 X 1   "from an abscessed wisdom tooth"  . Tobacco dependence   . Vitamin D deficiency     Past Surgical History:  Procedure Laterality Date  . APPENDECTOMY    . ARTHROTOMY Right 01/11/2016   Procedure: DISTAL INTERPHALANGEAL JOINT ARTHROTOMY RIGHT INDEX FINGER;  Surgeon: Leanora Cover, MD;  Location: [facility];  Service: Orthopedics;  Laterality: Right;  . COLONOSCOPY    . CYST EXCISION Right 01/11/2016   Procedure: EXCISION MUCOID CYST;  Surgeon: Leanora Cover, MD;  Location: [facility];  Service: Orthopedics;  Laterality: Right;  . LAPAROSCOPIC APPENDECTOMY N/A 10/31/2014   Procedure: APPENDECTOMY LAPAROSCOPIC;  Surgeon: Alphonsa Overall, MD;  Location: [facility];  Service: General;  Laterality: N/A;  . SALPINGOOPHORECTOMY Right 1999   "w/grapefruit-sized polyp"  . SHOULDER ARTHROSCOPY Right 2003   removal spurs  . SHOULDER ARTHROSCOPY W/ ROTATOR CUFF REPAIR Left 2012  . SINUS EXPLORATION  12/2013   [place]  . TENDON REPAIR Left 10/2009   torn tendon @ elbow  . VAGINAL HYSTERECTOMY  1978    There were no vitals filed for this visit.      Subjective Assessment - 04/07/17 1655    Subjective No pain until I put the ankle brace on. It pushes on the place where I have tenderness.    Currently in Pain? No/denies            [facility] PT Assessment - 04/07/17 0001      Strength   Right/Left Hip Left   Right Hip Extension 4/5   Right Hip ABduction 5/5   Right Hip ADduction 5/5   Left Hip Flexion 5/5   Left Hip Extension 4/5   Left Hip ABduction 5/5  Left Hip ADduction 5/5                     [facility] Adult PT Treatment/Exercise - 04/07/17 0001      Modalities   Modalities Ultrasound     Ultrasound   Ultrasound Location inferior/ medial ankle right    Ultrasound Parameters 50% 1.0 w/cm2 3 mhz    Ultrasound Goals Edema;Pain     Ankle Exercises: Stretches   Soleus Stretch 3 reps;30 seconds   Gastroc Stretch 3 reps;30 seconds   Other Stretch great toe extension     Ankle Exercises: Standing   Heel Raises 10 reps     Ankle Exercises: Seated   Ankle Circles/Pumps 20 reps   Towel Inversion/Eversion --  20 reps    Other Seated Ankle Exercises inversion into towel supine and seated bilateral isometrics.    Other Seated Ankle Exercises unable to coordinate yellow band, requires max cues to keep hip straight                       PT Long Term  Goals - 04/03/17 1206      PT LONG TERM GOAL #1   Title FOTO to 34% limitation to indicate significant improvement in functional ability   Baseline 53% limitation at eval   Time 6   Period Weeks   Status New   Target Date 05/15/17     PT LONG TERM GOAL #2   Title Pt will be able to walk as needed to run errands and complete household chores ankle pain <=2/10   Baseline 6/10 at eval   Time 6   Period Weeks   Status New   Target Date 05/15/17     PT LONG TERM GOAL #3   Title Gross LE MMT 5/5 to provide necessary support to LE biomechanical chain   Baseline see flowsheet   Time 6   Period Weeks   Status New   Target Date 05/15/17     PT LONG TERM GOAL #4   Title Pt will be independent with long term HEP for continued care/strengthening   Baseline will progress as appropraite   Time 6   Period Weeks   Status New   Target Date 05/15/17               Plan - 04/07/17 1428    Clinical Impression Statement Pt reports less pain and walking farther distance however when ASO is donned she has more pain. Reviewed HEP. Difficulty isolating ankle AROM without hip compensation. Added inversion isometics and standing calf stretches to HEP. Advised to discontinue if painful. Very tender at inferior medial malleolus so trial of ultrasound. Pt reports decreased tenderness afterward. Advised to take brief break from ASO due to irritation.    PT Next Visit Plan foot intrinsics, assess [place], try ionto; hip extension strengthening.    PT Home Exercise Plan heel raises, toe yoga, great toe ext stretch.    Consulted and Agree with Plan of Care Patient      Patient will benefit from skilled therapeutic intervention in order to improve the following deficits and impairments:  Abnormal gait, Decreased range of motion, Difficulty walking, Increased muscle spasms, Decreased activity tolerance, Pain, Improper body mechanics, Impaired flexibility, Decreased balance, Decreased strength, Impaired  sensation, Postural dysfunction  Visit Diagnosis: Pain in right ankle and joints of right foot  Difficulty in walking, not elsewhere classified  Muscle weakness (generalized)     Problem  List Patient Active Problem List   Diagnosis Date Noted  . Posterior tibial tendinitis, right leg 01/29/2017  . Medial epicondylitis of right elbow 12/11/2016  . Hot flashes 11/12/2016  . Obesity 11/12/2016  . Encounter for Medicare annual wellness exam 11/12/2016  . COPD mixed type ([place]) 06/05/2016  . Costochondritis 03/23/2015  . Hx of adenomatous colonic polyps 01/23/2015  . Medication management 11/21/2014  . Depression   . Hyperlipidemia   . Anemia   . Pre-diabetes   . Vitamin D deficiency   . Tobacco dependence   . Seasonal and perennial allergic rhinitis 07/03/2013    Dorene Ar, PTA 04/07/2017, 5:03 PM  [place], [place], [address] Phone: [phone]   Fax:  [phone]  Name: Karen Barrera MRN: [id number] Date of Birth: [birth year]

## 2017-04-13 ENCOUNTER — Ambulatory Visit: Payer: Medicare Other | Admitting: Physical Therapy

## 2017-04-13 ENCOUNTER — Encounter: Payer: Self-pay | Admitting: Physical Therapy

## 2017-04-13 DIAGNOSIS — R262 Difficulty in walking, not elsewhere classified: Secondary | ICD-10-CM

## 2017-04-13 DIAGNOSIS — M6281 Muscle weakness (generalized): Secondary | ICD-10-CM | POA: Diagnosis not present

## 2017-04-13 DIAGNOSIS — M25571 Pain in right ankle and joints of right foot: Secondary | ICD-10-CM

## 2017-04-13 NOTE — Therapy (Signed)
[place], [place], [address] Phone: [phone]   Fax:  [phone]  Physical Therapy Treatment  Patient Details  Name: Karen Barrera MRN: [id number] Date of Birth: 1951/03/07 Referring Provider: Mcarthur Rossetti, MD  Encounter Date: 04/13/2017      PT End of Session - 04/13/17 1419    Visit Number 3   Number of Visits 13   Date for PT Re-Evaluation 05/15/17   Authorization Type MCR- KX at visit 15   PT Start Time 1420   PT Stop Time 1455   PT Time Calculation (min) 35 min   Activity Tolerance Patient tolerated treatment well   Behavior During Therapy [facility] for tasks assessed/performed      Past Medical History:  Diagnosis Date  . Acute perforated appendicitis 10/31/2014  . Allergic rhinitis   . Allergy   . Anemia   . Arthritis    "fingers" (10/31/2014)  . Chronic bronchitis ([place])    "got it q yr til I had my nose OR" (10/31/2014)  . Colon polyp   . DDD (degenerative disc disease), cervical   . DDD (degenerative disc disease), lumbar   . Depression    "short term when my sister passed away unexpectedly"  . Family history of adverse reaction to anesthesia    "mother is hard to wake up; a little goes a long way w/her"  . GERD (gastroesophageal reflux disease)   . History of stomach ulcers   . Hyperlipidemia    "borderline; RX is preventative" (10/31/2014)  . Kidney stones    "they passed"  . Migraine    "used to get them alot; don't get them anymore" (10/31/2014)  . Osteoarthritis of both knees   . Osteopenia   . Pneumonia 2003 X 1  . Pre-diabetes   . Seizures ([place]) ~ 1962 X 1   "from an abscessed wisdom tooth"  . Tobacco dependence   . Vitamin D deficiency     Past Surgical History:  Procedure Laterality Date  . APPENDECTOMY    . ARTHROTOMY Right 01/11/2016   Procedure: DISTAL INTERPHALANGEAL JOINT ARTHROTOMY RIGHT INDEX FINGER;  Surgeon: Leanora Cover, MD;  Location: [place];  Service: Orthopedics;  Laterality: Right;  . COLONOSCOPY    . CYST EXCISION Right 01/11/2016   Procedure: EXCISION MUCOID CYST;  Surgeon: Leanora Cover, MD;  Location: [place];  Service: Orthopedics;  Laterality: Right;  . LAPAROSCOPIC APPENDECTOMY N/A 10/31/2014   Procedure: APPENDECTOMY LAPAROSCOPIC;  Surgeon: Alphonsa Overall, MD;  Location: [place];  Service: General;  Laterality: N/A;  . SALPINGOOPHORECTOMY Right 1999   "w/grapefruit-sized polyp"  . SHOULDER ARTHROSCOPY Right 2003   removal spurs  . SHOULDER ARTHROSCOPY W/ ROTATOR CUFF REPAIR Left 2012  . SINUS EXPLORATION  12/2013   [place]  . TENDON REPAIR Left 10/2009   torn tendon @ elbow  . VAGINAL HYSTERECTOMY  1978    There were no vitals filed for this visit.      Subjective Assessment - 04/13/17 1420    Subjective reports ultrasound seemed to help some. pain seems to have decreased. has been walking around a lot today so it's a little puffy.    How long can you walk comfortably? unable other than first 30 min in the morning   Currently in Pain? Yes   Pain Score 5    Pain Location Ankle   Pain Orientation Right;Medial   Pain Descriptors / Indicators --  needles  [facility] Adult PT Treatment/Exercise - 04/13/17 0001      Exercises   Exercises Knee/Hip     Knee/Hip Exercises: Sidelying   Hip ADduction 20 reps;Both   Clams x30 each     Knee/Hip Exercises: Prone   Hamstring Curl 15 reps   Hamstring Curl Limitations iso HS curl + hip extension   Hip Extension 15 reps   Hip Extension Limitations 3s holds     Kinesiotix   Facilitate Muscle  along post tib to reduce ER     Ankle Exercises: Seated   Towel Crunch Other (comment)  bilateral use                PT Education - 04/13/17 1458    Education provided Yes   Education Details rest breaks, exercise form/rationale, turnout of LE   Person(s) Educated Patient   Methods  Explanation;Demonstration;Tactile cues;Verbal cues;Handout   Comprehension Verbalized understanding;Returned demonstration;Verbal cues required;Tactile cues required;Need further instruction             PT Long Term Goals - 04/03/17 1206      PT LONG TERM GOAL #1   Title FOTO to 34% limitation to indicate significant improvement in functional ability   Baseline 53% limitation at eval   Time 6   Period Weeks   Status New   Target Date 05/15/17     PT LONG TERM GOAL #2   Title Pt will be able to walk as needed to run errands and complete household chores ankle pain <=2/10   Baseline 6/10 at eval   Time 6   Period Weeks   Status New   Target Date 05/15/17     PT LONG TERM GOAL #3   Title Gross LE MMT 5/5 to provide necessary support to LE biomechanical chain   Baseline see flowsheet   Time 6   Period Weeks   Status New   Target Date 05/15/17     PT LONG TERM GOAL #4   Title Pt will be independent with long term HEP for continued care/strengthening   Baseline will progress as appropraite   Time 6   Period Weeks   Status New   Target Date 05/15/17               Plan - 04/13/17 1442    Clinical Impression Statement began treatment with ultrasound which pt reported significantly decreased concordant pain. Significant weakness at hip addressed with exercises today. Pt reports noticing turnout of RLE in seated and standing placing excessive strain on post tibialis. Denied pain following treatment and educated on importance of rest when pain increases.    PT Treatment/Interventions ADLs/Self Care Home Management;Cryotherapy;Electrical Stimulation;Iontophoresis 4mg /ml Dexamethasone;Functional mobility training;Stair training;Gait training;Ultrasound;Traction;Moist Heat;Therapeutic activities;Therapeutic exercise;Balance training;Neuromuscular re-education;Patient/family education;Passive range of motion;Manual techniques;Dry needling;Taping   PT Next Visit Plan foot  intrinsics, ionto prn, hip strength   PT Home Exercise Plan heel raises, toe yoga, great toe ext stretch. clam, hip add, prone hip ext, towel scrunches   Consulted and Agree with Plan of Care Patient      Patient will benefit from skilled therapeutic intervention in order to improve the following deficits and impairments:  Abnormal gait, Decreased range of motion, Difficulty walking, Increased muscle spasms, Decreased activity tolerance, Pain, Improper body mechanics, Impaired flexibility, Decreased balance, Decreased strength, Impaired sensation, Postural dysfunction  Visit Diagnosis: Pain in right ankle and joints of right foot  Difficulty in walking, not elsewhere classified  Muscle weakness (generalized)     Problem  List Patient Active Problem List   Diagnosis Date Noted  . Posterior tibial tendinitis, right leg 01/29/2017  . Medial epicondylitis of right elbow 12/11/2016  . Hot flashes 11/12/2016  . Obesity 11/12/2016  . Encounter for Medicare annual wellness exam 11/12/2016  . COPD mixed type ([place]) 06/05/2016  . Costochondritis 03/23/2015  . Hx of adenomatous colonic polyps 01/23/2015  . Medication management 11/21/2014  . Depression   . Hyperlipidemia   . Anemia   . Pre-diabetes   . Vitamin D deficiency   . Tobacco dependence   . Seasonal and perennial allergic rhinitis 07/03/2013    Dreanna Kyllo C. Brandolyn Shortridge PT, DPT 04/13/17 2:59 PM   [facility] [address] [place], [place], [address] Phone: [phone]   Fax:  [phone]  Name: Karen Barrera MRN: [id number] Date of Birth: [birth year]

## 2017-04-14 ENCOUNTER — Other Ambulatory Visit: Payer: Medicare Other

## 2017-04-14 ENCOUNTER — Ambulatory Visit (HOSPITAL_BASED_OUTPATIENT_CLINIC_OR_DEPARTMENT_OTHER): Payer: Medicare Other | Admitting: Genetics

## 2017-04-14 ENCOUNTER — Encounter: Payer: Self-pay | Admitting: Genetics

## 2017-04-14 DIAGNOSIS — Z809 Family history of malignant neoplasm, unspecified: Secondary | ICD-10-CM

## 2017-04-14 DIAGNOSIS — Z803 Family history of malignant neoplasm of breast: Secondary | ICD-10-CM

## 2017-04-14 DIAGNOSIS — Z800 Family history of malignant neoplasm of digestive organs: Secondary | ICD-10-CM

## 2017-04-14 DIAGNOSIS — Z315 Encounter for genetic counseling: Secondary | ICD-10-CM | POA: Diagnosis not present

## 2017-04-14 DIAGNOSIS — Z808 Family history of malignant neoplasm of other organs or systems: Secondary | ICD-10-CM

## 2017-04-14 NOTE — Progress Notes (Signed)
REFERRING PROVIDER: [name], MD [address] [place], [place] [address]  PRIMARY PROVIDER:  [name], MD  PRIMARY REASON FOR VISIT:  1. Family history of colon cancer   2. Family history of skin cancer   3. Family history of breast cancer      HISTORY OF PRESENT ILLNESS:   Karen Barrera, a 66 y.o. female, was seen for a  cancer genetics consultation at the request of Dr. Melford Aase due to a family history of cancer.  Karen Barrera presents to clinic today to discuss the possibility of a hereditary predisposition to cancer, genetic testing, and to further clarify her future cancer risks, as well as potential cancer risks for family members.      Karen Barrera is a 66 y.o. female with no personal history of cancer.    HORMONAL RISK FACTORS:  Menarche was at age 24.  First live birth at age 40.  Ovaries intact: no.  Hysterectomy: no.  Menopausal status: postmenopausal.  HRT use: 36, off back on now. Estradiol years. Colonoscopy: yes; 10-12 polyps total.  At least 4-5 (possibly more) f have been precancerous. No exposure to excessive radiation.  She sees a dermatologist regularly.    Past Medical History:  Diagnosis Date  . Acute perforated appendicitis 10/31/2014  . Allergic rhinitis   . Allergy   . Anemia   . Arthritis    "fingers" (10/31/2014)  . Chronic bronchitis ([place])    "got it q yr til I had my nose OR" (10/31/2014)  . Colon polyp   . DDD (degenerative disc disease), cervical   . DDD (degenerative disc disease), lumbar   . Depression    "short term when my sister passed away unexpectedly"  . Family history of adverse reaction to anesthesia    "mother is hard to wake up; a little goes a long way w/her"  . Family history of breast cancer   . Family history of colon cancer   . Family history of skin cancer   . GERD (gastroesophageal reflux disease)   . History of stomach ulcers   . Hyperlipidemia    "borderline; RX is preventative"  (10/31/2014)  . Kidney stones    "they passed"  . Migraine    "used to get them alot; don't get them anymore" (10/31/2014)  . Osteoarthritis of both knees   . Osteopenia   . Pneumonia 2003 X 1  . Pre-diabetes   . Seizures ([place]) ~ 1962 X 1   "from an abscessed wisdom tooth"  . Tobacco dependence   . Vitamin D deficiency     Past Surgical History:  Procedure Laterality Date  . APPENDECTOMY    . ARTHROTOMY Right 01/11/2016   Procedure: DISTAL INTERPHALANGEAL JOINT ARTHROTOMY RIGHT INDEX FINGER;  Surgeon: Leanora Cover, MD;  Location: [place];  Service: Orthopedics;  Laterality: Right;  . COLONOSCOPY    . CYST EXCISION Right 01/11/2016   Procedure: EXCISION MUCOID CYST;  Surgeon: Leanora Cover, MD;  Location: [place];  Service: Orthopedics;  Laterality: Right;  . LAPAROSCOPIC APPENDECTOMY N/A 10/31/2014   Procedure: APPENDECTOMY LAPAROSCOPIC;  Surgeon: Alphonsa Overall, MD;  Location: [place];  Service: General;  Laterality: N/A;  . SALPINGOOPHORECTOMY Right 1999   "w/grapefruit-sized polyp"  . SHOULDER ARTHROSCOPY Right 2003   removal spurs  . SHOULDER ARTHROSCOPY W/ ROTATOR CUFF REPAIR Left 2012  . SINUS EXPLORATION  12/2013   [place]  . TENDON REPAIR Left 10/2009   torn tendon @  elbow  . VAGINAL HYSTERECTOMY  1978    Social History   Social History  . Marital status: Married    Spouse name: N/A  . Number of children: N/A  . Years of education: N/A   Occupational History  . Retired    Social History Main Topics  . Smoking status: Current Every Day Smoker    Packs/day: 1.00    Years: 25.00    Types: Cigarettes  . Smokeless tobacco: Never Used     Comment: Pt quit smoking during pregnancies, also 3 yrs ago for 6 mos; "and several years @ times"  . Alcohol use No  . Drug use: No  . Sexual activity: Not Currently   Other Topics Concern  . Not on file   Social History Narrative  . No narrative on file     FAMILY HISTORY:  We obtained a  detailed, 4-generation family history.  Significant diagnoses are listed below: Family History  Problem Relation Age of Onset  . Colon cancer Other        dx 40's/50's  . Colon cancer Other 49  . Diabetes Mother   . Skin cancer Mother 82       unsure what type  . Seizures Sister   . Stroke Maternal Grandmother   . Hyperlipidemia Maternal Grandfather   . Arthritis Paternal Grandmother   . Melanoma Paternal Grandmother 56  . Colon polyps Father        has had about 5 every c-scope (every 5 years-ish)  . Colon cancer Other 55  . Cancer Other        type unknown  . Breast cancer Other 35  . Breast cancer Other   . Stomach cancer Neg Hx   . Esophageal cancer Neg Hx   . Rectal cancer Neg Hx    Karen Barrera has one sister who passed away at 72 from an epileptic seizure. She has two children both boys ages 56 and 59. She also had a daughter who was stillborn at 27 months. Her mother has a history of skin cancer diagnosed at age 42. Her mother has a brother who passed away from heart disease at 61 and a sister who is still living at 61. Karen Barrera has a first cousin who passed away from gastric cancer at age 54 on her maternal side. Her maternal grandmother passed away at 48 of a stroke and her maternal grandfather passed away at 53 from a heart attack. Her maternal grandmother has two brothers with skin cancer and two sisters with breast cancer. Her two great aunts were both diagnosed with breast cancer in their 37's.   Karen Barrera has a father who has a history of polyps with about 4-5 polyps found every 5 years. Her dad has a brother who passed away at age 34 from heart disease and a half brother who is still living at 20. Her paternal grandmother had skin cancer in her 49s and passed away at 43. Her paternal grandfather passed away at 46 in an accident. Her paternal grandfather had two sisters with colon cancer. One sister was diagnosed in her 51's/50s and the other in her 68s.  Both have  passed away. Her paternal grandfather also had a brother with colon cancer who was also diagnosed in his 61s and has passed away. Karen Barrera raised a concern about cancer skipping generations to which we ensured her that if her dad did not have a mutation that predisposed him to colon cancer that  she could not inherit what he did not have.   Karen Barrera is unaware of previous family history of genetic testing for hereditary cancer risks. Patient's maternal ancestors are of Native American/Caucasian descent, and paternal ancestors are of [place] descent. There patient thinks there is some Ashkenazi Jewish ancestry on the paternal side. There is no known consanguinity.  GENETIC COUNSELING ASSESSMENT: Karen Barrera is a 66 y.o. female with a family history  which is somewhat suggestive of a Hereditary Cancer Predisposition Syndrome. We, therefore, discussed and recommended the following at today's visit.   DISCUSSION: We reviewed the characteristics, features and inheritance patterns of hereditary cancer syndromes. We also discussed genetic testing, including the appropriate family members to test, the process of testing, insurance coverage and turn-around-time for results. We discussed the implications of a negative, positive and/or variant of uncertain significant result. We recommended Karen Barrera pursue genetic testing for the Common Hereditary Cancer gene panel + melanoma panel. The Hereditary Gene Panel offered by [facility] includes sequencing and/or deletion duplication testing of the following 46 genes: APC, ATM, AXIN2, BARD1, BMPR1A, BRCA1, BRCA2, BRIP1, CDH1, CDKN2A (p14ARF), CDKN2A (p16INK4a), CHEK2, CTNNA1, DICER1, EPCAM (Deletion/duplication testing only), GREM1 (promoter region deletion/duplication testing only), KIT, MEN1, MLH1, MSH2, MSH3, MSH6, MUTYH, NBN, NF1, NHTL1, PALB2, PDGFRA, PMS2, POLD1, POLE, PTEN, RAD50, RAD51C, RAD51D, SDHB, SDHC, SDHD, SMAD4, SMARCA4. STK11, TP53, TSC1, TSC2, and  VHL.  The following genes were evaluated for sequence changes only: SDHA and HOXB13 c.251G>A variant only. The [facility] Melanoma Panel analyzes the following 12 genes: BAP1 CDK4 MITF POT1 RB1 TP53 MC1R TERT.  We discussed that only 5-10% of cancers are associated with a Hereditary Cancer Predisposition Syndrome.  The most common hereditary cancer syndrome associated with colon cancer is Lynch Syndrome.  Lynch Syndrome is caused by mutations in the genes: MLH1, MSH2, MSH6, PMS2 and EPCAM.  This syndrome increases the risk for colon, uterine, ovarian and stomach cancers, as well as others.  Families with Lynch Syndrome tend to have multiple family members with these cancers, typically diagnosed under age 32, and diagnoses in multiple generations.    We discussed that there are several other genes that are associated with an increased risk for colon cancer and increased polyp burden (MUTYH, APC, POLE, CHEK2, etc.) We also dicussed that there are many genes that cause many different types of cancer risks.    We discussed that if she is found to have a mutation in one of these genes, it may impact future medical management recommendations such as increased cancer screenings and consideration of risk reducing surgeries.  A positive result could also have implications for the patient's family members.  We discussed with her that if she were to be positive than each of her children would have a 50% chance of also inheriting the mutation and we would recommend that they go forth with testing if that is the case. We also informed her of relatives on her father's side such as her second cousins who could benefit from testing and genetic counseling if they desired it.    A Negative result would mean we were unable to identify a hereditary component to her cancer, but does not rule out the possibility of a hereditary basis for her cancer.  There could be mutations that are undetectable by current technology, or in  genes not yet tested or identified to increase cancer risk.    We discussed the potential to find a Variant of Uncertain Significance or VUS.  These are variants  that have not yet been identified as pathogenic or benign, and it is unknown if this variant is associated with increased cancer risk or if this is a normal finding.  Most VUS's are reclassified to benign or likely benign.   It should not be used to make medical management decisions. With time, we suspect the lab will determine the significance of any VUS's identified if any.   Based on Karen Barrera's personal history of polyps cancer, she may meet medical criteria for genetic testing. She may still have an out of pocket cost. We discussed that if her out of pocket cost for testing is over $100, the laboratory will call and confirm whether she wants to proceed with testing.  If the out of pocket cost of testing is less than $100 she will be billed by the genetic testing laboratory.   PLAN: After considering the risks, benefits, and limitations, Karen Barrera  provided informed consent to pursue genetic testing and the blood sample was sent to [facility] for analysis of the Common Hereditary Cancer Panel + Melanoma Panel. Results should be available within approximately 2-3 weeks' time, at which point they will be disclosed by telephone to Karen Barrera, as will any additional recommendations warranted by these results. Karen Barrera will receive a summary of her genetic counseling visit and a copy of her results once available. This information will also be available in Epic. We encouraged Karen Barrera to remain in contact with cancer genetics annually so that we can continuously update the family history and inform her of any changes in cancer genetics and testing that may be of benefit for her family. Karen Barrera's questions were answered to her satisfaction today. Our contact information was provided should additional questions or concerns  arise.   Based on Karen Barrera's family history, we recommended her father or other paternal relatives, have genetic counseling and testing based on the paternal family history of colon cancer. Karen Barrera will let us know if we can be of any assistance in coordinating genetic counseling and/or testing for this family member.   Lastly, we encouraged Ms. Hayter to remain in contact with cancer genetics annually so that we can continuously update the family history and inform her of any changes in cancer genetics and testing that may be of benefit for this family.   Ms.  Pennino's questions were answered to her satisfaction today. Our contact information was provided should additional questions or concerns arise. Thank you for the referral and allowing [place] to share in the care of your patient.   Tana Felts, MS Genetic Counselor [email or web address] phone: [phone]  The patient was seen for a total of 40 minutes in face-to-face genetic counseling. Genetic Counseling Intern, Rise Paganini, conducted majority of the sesion under my supervision.

## 2017-04-21 ENCOUNTER — Ambulatory Visit: Payer: Medicare Other | Admitting: Physical Therapy

## 2017-04-21 DIAGNOSIS — M6281 Muscle weakness (generalized): Secondary | ICD-10-CM | POA: Diagnosis not present

## 2017-04-21 DIAGNOSIS — M25571 Pain in right ankle and joints of right foot: Secondary | ICD-10-CM | POA: Diagnosis not present

## 2017-04-21 DIAGNOSIS — R262 Difficulty in walking, not elsewhere classified: Secondary | ICD-10-CM

## 2017-04-21 NOTE — Therapy (Signed)
[facility] [place], [place], [address] Phone: [phone]   Fax:  [phone]  Physical Therapy Treatment  Patient Details  Name: Karen Barrera MRN: [id number] Date of Birth: [birth year] Referring Provider: Mcarthur Rossetti, MD  Encounter Date: 04/21/2017      PT End of Session - 04/21/17 0816    Visit Number 4   Number of Visits 13   Date for PT Re-Evaluation 05/15/17   Authorization Type MCR- KX at visit 15   PT Start Time 0806   PT Stop Time 0845   PT Time Calculation (min) 39 min      Past Medical History:  Diagnosis Date  . Acute perforated appendicitis 10/31/2014  . Allergic rhinitis   . Allergy   . Anemia   . Arthritis    "fingers" (10/31/2014)  . Chronic bronchitis ([place])    "got it q yr til I had my nose OR" (10/31/2014)  . Colon polyp   . DDD (degenerative disc disease), cervical   . DDD (degenerative disc disease), lumbar   . Depression    "short term when my sister passed away unexpectedly"  . Family history of adverse reaction to anesthesia    "mother is hard to wake up; a little goes a long way w/her"  . Family history of breast cancer   . Family history of colon cancer   . Family history of skin cancer   . GERD (gastroesophageal reflux disease)   . History of stomach ulcers   . Hyperlipidemia    "borderline; RX is preventative" (10/31/2014)  . Kidney stones    "they passed"  . Migraine    "used to get them alot; don't get them anymore" (10/31/2014)  . Osteoarthritis of both knees   . Osteopenia   . Pneumonia 2003 X 1  . Pre-diabetes   . Seizures ([place]) ~ 1962 X 1   "from an abscessed wisdom tooth"  . Tobacco dependence   . Vitamin D deficiency     Past Surgical History:  Procedure Laterality Date  . APPENDECTOMY    . ARTHROTOMY Right 01/11/2016   Procedure: DISTAL INTERPHALANGEAL JOINT ARTHROTOMY RIGHT INDEX FINGER;  Surgeon: Leanora Cover, MD;  Location: [facility];  Service: Orthopedics;  Laterality: Right;  . COLONOSCOPY    . CYST EXCISION Right 01/11/2016   Procedure: EXCISION MUCOID CYST;  Surgeon: Leanora Cover, MD;  Location: [facility];  Service: Orthopedics;  Laterality: Right;  . LAPAROSCOPIC APPENDECTOMY N/A 10/31/2014   Procedure: APPENDECTOMY LAPAROSCOPIC;  Surgeon: Alphonsa Overall, MD;  Location: [facility];  Service: General;  Laterality: N/A;  . SALPINGOOPHORECTOMY Right 1999   "w/grapefruit-sized polyp"  . SHOULDER ARTHROSCOPY Right 2003   removal spurs  . SHOULDER ARTHROSCOPY W/ ROTATOR CUFF REPAIR Left 2012  . SINUS EXPLORATION  12/2013   [facility]  . TENDON REPAIR Left 10/2009   torn tendon @ elbow  . VAGINAL HYSTERECTOMY  1978    There were no vitals filed for this visit.      Subjective Assessment - 04/21/17 0812    Subjective Hosted 100 people for wedding at my house. Up to 6-7/10 pain.    Currently in Pain? Yes   Pain Score 3    Pain Location Ankle   Pain Orientation Right;Medial   Pain Descriptors / Indicators Sore;Aching   Aggravating Factors  shoe,    Pain Relieving Factors icy hot, voltaren gel, ibuprofen  [facility] Adult PT Treatment/Exercise - 04/21/17 0001      Knee/Hip Exercises: Sidelying   Hip ADduction 20 reps;Both   Clams x30 each     Knee/Hip Exercises: Prone   Hamstring Curl 15 reps   Hamstring Curl Limitations iso HS curl + hip extension   Hip Extension 15 reps   Hip Extension Limitations 3s holds     Modalities   Modalities Iontophoresis     Ultrasound   Ultrasound Location inferior medial ankle right    Ultrasound Parameters 50% 1.0 w/cm2  3.3 mhz    Ultrasound Goals Edema;Pain     Iontophoresis   Type of Iontophoresis Dexamethasone   Location medial ankle    Dose 1 ml   Time  6 hours      Ankle Exercises: Seated   Ankle Circles/Pumps 20 reps   Towel Crunch Other (comment)  bilateral use     Ankle Exercises: Stretches   Other  Stretch great toe extension                     PT Long Term Goals - 04/03/17 1206      PT LONG TERM GOAL #1   Title FOTO to 34% limitation to indicate significant improvement in functional ability   Baseline 53% limitation at eval   Time 6   Period Weeks   Status New   Target Date 05/15/17     PT LONG TERM GOAL #2   Title Pt will be able to walk as needed to run errands and complete household chores ankle pain <=2/10   Baseline 6/10 at eval   Time 6   Period Weeks   Status New   Target Date 05/15/17     PT LONG TERM GOAL #3   Title Gross LE MMT 5/5 to provide necessary support to LE biomechanical chain   Baseline see flowsheet   Time 6   Period Weeks   Status New   Target Date 05/15/17     PT LONG TERM GOAL #4   Title Pt will be independent with long term HEP for continued care/strengthening   Baseline will progress as appropraite   Time 6   Period Weeks   Status New   Target Date 05/15/17               Plan - 04/21/17 0855    Clinical Impression Statement Wedding at patient's house this weekend caused increased time on feet and increased pain. No time for HEP. [place] helps for a short period. Reviewed HEP and also notice right hip tightness. Cert signed for iontophoresis to patch applied today. Sees her MD for follow up tomorrow.    PT Next Visit Plan foot intrinsics, how was ionto, hip strength, what did MD say    PT Home Exercise Plan heel raises, toe yoga, great toe ext stretch. clam, hip add, prone hip ext, towel scrunches   Consulted and Agree with Plan of Care Patient      Patient will benefit from skilled therapeutic intervention in order to improve the following deficits and impairments:  Abnormal gait, Decreased range of motion, Difficulty walking, Increased muscle spasms, Decreased activity tolerance, Pain, Improper body mechanics, Impaired flexibility, Decreased balance, Decreased strength, Impaired sensation, Postural dysfunction  Visit  Diagnosis: Pain in right ankle and joints of right foot  Difficulty in walking, not elsewhere classified  Muscle weakness (generalized)     Problem List Patient Active Problem List   Diagnosis Date Noted  .  Family history of colon cancer   . Family history of skin cancer   . Family history of breast cancer   . Posterior tibial tendinitis, right leg 01/29/2017  . Medial epicondylitis of right elbow 12/11/2016  . Hot flashes 11/12/2016  . Obesity 11/12/2016  . Encounter for Medicare annual wellness exam 11/12/2016  . COPD mixed type ([place]) 06/05/2016  . Costochondritis 03/23/2015  . Hx of adenomatous colonic polyps 01/23/2015  . Medication management 11/21/2014  . Depression   . Hyperlipidemia   . Anemia   . Pre-diabetes   . Vitamin D deficiency   . Tobacco dependence   . Seasonal and perennial allergic rhinitis 07/03/2013    Dorene Ar, PTA 04/21/2017, 9:21 AM  [facility] [place], [place], [address] Phone: [phone]   Fax:  [phone]  Name: Karen Barrera MRN: [id number] Date of Birth: 10-24-1950

## 2017-04-22 ENCOUNTER — Ambulatory Visit (INDEPENDENT_AMBULATORY_CARE_PROVIDER_SITE_OTHER): Payer: Medicare Other | Admitting: Orthopaedic Surgery

## 2017-04-22 DIAGNOSIS — M76821 Posterior tibial tendinitis, right leg: Secondary | ICD-10-CM

## 2017-04-22 NOTE — Progress Notes (Signed)
The patient is following up after physical therapy to treat right posterior tibial tendinitis. She is refrained from open back shoes. She says therapy is helped significantly. She is feeling much better overall. She says her swelling and pain are decreasing. She actually has therapy visits twice weekly over the next coming 3 weeks.  On exam there is no swelling along the course of the right posterior tibial tendon. The pain is minimal as well. She can perform a toe raise easily on that side.  At this point she'll complete her physical therapy and can follow up as needed. All questions and concerns were answered and addressed.

## 2017-04-24 ENCOUNTER — Ambulatory Visit: Payer: Self-pay | Admitting: Genetics

## 2017-04-24 ENCOUNTER — Telehealth: Payer: Self-pay | Admitting: Genetics

## 2017-04-24 ENCOUNTER — Encounter: Payer: Self-pay | Admitting: Genetics

## 2017-04-24 ENCOUNTER — Ambulatory Visit: Payer: Medicare Other | Admitting: Physical Therapy

## 2017-04-24 ENCOUNTER — Encounter: Payer: Self-pay | Admitting: Physical Therapy

## 2017-04-24 DIAGNOSIS — Z1379 Encounter for other screening for genetic and chromosomal anomalies: Secondary | ICD-10-CM | POA: Insufficient documentation

## 2017-04-24 DIAGNOSIS — Z800 Family history of malignant neoplasm of digestive organs: Secondary | ICD-10-CM

## 2017-04-24 DIAGNOSIS — M25571 Pain in right ankle and joints of right foot: Secondary | ICD-10-CM | POA: Diagnosis not present

## 2017-04-24 DIAGNOSIS — Z803 Family history of malignant neoplasm of breast: Secondary | ICD-10-CM

## 2017-04-24 DIAGNOSIS — R262 Difficulty in walking, not elsewhere classified: Secondary | ICD-10-CM

## 2017-04-24 DIAGNOSIS — M6281 Muscle weakness (generalized): Secondary | ICD-10-CM

## 2017-04-24 DIAGNOSIS — Z808 Family history of malignant neoplasm of other organs or systems: Secondary | ICD-10-CM

## 2017-04-24 NOTE — Progress Notes (Signed)
HPI: Karen Barrera was previously seen in the [facility] clinic on 04/14/2017 due to a personal history of colon polyps, and a family history of cancer and concerns regarding a hereditary predisposition to cancer. Please refer to our prior cancer genetics clinic note for more information regarding Karen Barrera's medical, social and family histories, and our assessment and recommendations, at the time. Karen Barrera's recent genetic test results were disclosed to her, as well as recommendations warranted by these results. These results and recommendations are discussed in more detail below.   FAMILY HISTORY:  We obtained a detailed, 4-generation family history.  Significant diagnoses are listed below: Family History  Problem Relation Age of Onset  . Colon cancer Other        dx 40's/50's  . Colon cancer Other 16  . Diabetes Mother   . Skin cancer Mother 56       unsure what type  . Seizures Sister   . Stroke Maternal Grandmother   . Hyperlipidemia Maternal Grandfather   . Arthritis Paternal Grandmother   . Melanoma Paternal Grandmother 15  . Colon polyps Father        has had about 5 every c-scope (every 5 years-ish)  . Colon cancer Other 42  . Cancer Other        type unknown  . Breast cancer Other 21  . Breast cancer Other   . Stomach cancer Neg Hx   . Esophageal cancer Neg Hx   . Rectal cancer Neg Hx    Karen Barrera has one sister who passed away at 24 from an epileptic seizure. She has two children both boys ages 9 and 6. She also had a daughter who was stillborn at 58 months. Her mother has a history of skin cancer diagnosed at age 66. Her mother has a brother who passed away from heart disease at 43 and a sister who is still living at 53. Karen Barrera has a first Barrera who passed away from gastric cancer at age 44 on her maternal side. Her maternal grandmother passed away at 69 of a stroke and her maternal grandfather passed away at 24 from a heart attack. Her  maternal grandmother has two brothers with skin cancer and two sisters with breast cancer. Her two great aunts were both diagnosed with breast cancer in their 35's.   Karen Barrera has a father who has a history of polyps with about 4-5 polyps found every 5 years. Her dad has a brother who passed away at age 23 from heart disease and a half brother who is still living at 31. Her paternal grandmother had skin cancer in her 88s and passed away at 37. Her paternal grandfather passed away at 62 in an accident. Her paternal grandfather had two sisters with colon cancer. One sister was diagnosed in her 54's/50s and the other in her 64s.  Both have passed away. Her paternal grandfather also had a brother with colon cancer who was also diagnosed in his 70s and has passed away. Karen Barrera raised a concern about cancer skipping generations to which we ensured her that if her dad did not have a mutation that predisposed him to colon cancer that she could not inherit what he did not have.   Karen Barrera is unaware of previous family history of genetic testing for hereditary cancer risks. Patient's maternal ancestors are of Native American/Caucasian descent, and paternal ancestors are of [place] descent. There patient thinks there is some Ashkenazi Jewish ancestry on  the paternal side. There is no known consanguinity.  GENETIC TEST RESULTS: Genetic testing performed through [facility] Common Hereditary Cancer Panel + Melanoma Panel reported out on 04/22/2017 showed no pathogenic mutations. The following genes were evaluated for sequence changes and exonic deletions/duplications: APC, ATM, AXIN2, BAP1, BARD1, BMPR1A, BRCA1, BRCA2, BRIP1, CDH1, CDK4, CDKN2A (p14ARF), CDKN2A (p16INK4a), CHEK2,CTNNA1, DICER1, EPCAM*, GREM1*, KIT, MC1R, MEN1, MLH1, MSH2, MSH3, MSH6, MUTYH, NBN, NF1, PALB2, PDGFRA, PMS2,POLD1, POLE, POT1, PTEN, RAD50, RAD51C, RAD51D, RB1, SDHB, SDHC, SDHD, SMAD4, SMARCA4, STK11, TERT, TP53, TSC1,TSC2, VHL The  following genes were evaluated for sequence changes only:HOXB13*, MITF*, NTHL1*, SDHA  The test report will be scanned into EPIC and will be located under the Molecular Pathology section of the Results Review tab.A portion of the result report is included below for reference.     We discussed with Karen Barrera that because current genetic testing is not perfect, it is possible there may be a gene mutation in one of these genes that current testing cannot detect, but that chance is small. We also discussed, that there could be another gene that has not yet been discovered, or that we have not yet tested, that is responsible for the cancer diagnoses in the family. It is also possible there is a hereditary cause for the cancer in the family that Karen Barrera did not inherit and therefore was not identified in her.Therefore, it is important to remain in touch with cancer genetics in the future so that we can continue to offer Karen Barrera the most up to date genetic testing.   ADDITIONAL GENETIC TESTING: We discussed with Karen Barrera that there are other genes that are associated with increased cancer risk that can be analyzed. The laboratories that offer this testing look at these additional genes via a hereditary cancer gene panel. Should Karen Barrera wish to pursue additional genetic testing, we are happy to discuss and coordinate this testing, at any time.    CANCER SCREENING RECOMMENDATIONS:  Based on these negative genetic test results, there areno additional cancer risks we are aware of for Karen Barrera, and no additional screening or medical management that we would recommend for her at this time based on genetic testing.  We have not identified a genetic/hereditary cause for her colon polyps or the cancer in her family.    This result is  and indicates that it is unlikely Karen Barrera has an increased risk for a future cancer due to a mutation in one of these genes. This normal test also  suggests that Karen Barrera colon polyps are most likely not due to an inherited predisposition associated with one of these genes.  It is recommended she continue to follow the cancer management and screening guidelines provided by her gastroenterologist and primary healthcare provider. Other factors such as her personal polyp history and family history may still affect her cancer risk.  RECOMMENDATIONS FOR FAMILY MEMBERS: Women in this family might be at some increased risk of developing cancer, over the general population risk, simply due to the family history of cancer. We recommended women in this family have a yearly mammogram beginning at age 77, or 104 years younger than the earliest onset of cancer, an annual clinical breast exam, and perform monthly breast self-exams. Women in this family should also have a gynecological exam as recommended by their primary provider. All family members should have a colonoscopy by age 74 (or 10 years earlier than the youngest diagnosis of colon cancer).  Based on Karen Barrera's  family history, we recommended her father and paternal relatives, have genetic counseling and testing due to the family history of colon cancer. Ms. Kesselman will let us know if we can be of any assistance in coordinating genetic counseling and/or testing for this family member.   FOLLOW-UP: Lastly, we discussed with Ms. Rowland that cancer genetics is a rapidly advancing field and it is possible that new genetic tests will be appropriate for her and/or her family members in the future. We encouraged her to remain in contact with cancer genetics on an annual basis so we can update her personal and family histories and let her know of advances in cancer genetics that may benefit this family.   Our contact number was provided. Ms. Badgett's questions were answered to her satisfaction, and she knows she is welcome to call us at anytime with additional questions or concerns.   Ferol Luz, MS Genetic Counselor [email or web address]

## 2017-04-24 NOTE — Telephone Encounter (Signed)
Revealed Negative genetic test results.  Explained that we do not know why she has developed colon polyps.  We recommend she continue to follow the recommendations from her Gastroenterologist and other providers regarding cancer screening.  Encouraged her to check in with [place] annually to stay informed about any updates or updated genetic testing that may be appropriate in the future.   Call was completed by genetic counseling intern, Rise Paganini under my supervision.

## 2017-04-24 NOTE — Therapy (Signed)
[place], [place], [address] Phone: [phone]   Fax:  [phone]  Physical Therapy Treatment  Patient Details  Name: Karen Barrera MRN: [id number] Date of Birth: 16-Aug-1950 Referring Provider: Mcarthur Rossetti, MD  Encounter Date: 04/24/2017      PT End of Session - 04/24/17 0849    Visit Number 5   Number of Visits 13   Date for PT Re-Evaluation 05/15/17   Authorization Type MCR- KX at visit 15   PT Start Time 0849   PT Stop Time 0923   PT Time Calculation (min) 34 min   Activity Tolerance Patient tolerated treatment well   Behavior During Therapy [facility] for tasks assessed/performed      Past Medical History:  Diagnosis Date  . Acute perforated appendicitis 10/31/2014  . Allergic rhinitis   . Allergy   . Anemia   . Arthritis    "fingers" (10/31/2014)  . Chronic bronchitis ([place])    "got it q yr til I had my nose OR" (10/31/2014)  . Colon polyp   . DDD (degenerative disc disease), cervical   . DDD (degenerative disc disease), lumbar   . Depression    "short term when my sister passed away unexpectedly"  . Family history of adverse reaction to anesthesia    "mother is hard to wake up; a little goes a long way w/her"  . Family history of breast cancer   . Family history of colon cancer   . Family history of skin cancer   . GERD (gastroesophageal reflux disease)   . History of stomach ulcers   . Hyperlipidemia    "borderline; RX is preventative" (10/31/2014)  . Kidney stones    "they passed"  . Migraine    "used to get them alot; don't get them anymore" (10/31/2014)  . Osteoarthritis of both knees   . Osteopenia   . Pneumonia 2003 X 1  . Pre-diabetes   . Seizures ([place]) ~ 1962 X 1   "from an abscessed wisdom tooth"  . Tobacco dependence   . Vitamin D deficiency     Past Surgical History:  Procedure Laterality Date  . APPENDECTOMY    . ARTHROTOMY Right 01/11/2016   Procedure:  DISTAL INTERPHALANGEAL JOINT ARTHROTOMY RIGHT INDEX FINGER;  Surgeon: Leanora Cover, MD;  Location: [facility];  Service: Orthopedics;  Laterality: Right;  . COLONOSCOPY    . CYST EXCISION Right 01/11/2016   Procedure: EXCISION MUCOID CYST;  Surgeon: Leanora Cover, MD;  Location: [facility];  Service: Orthopedics;  Laterality: Right;  . LAPAROSCOPIC APPENDECTOMY N/A 10/31/2014   Procedure: APPENDECTOMY LAPAROSCOPIC;  Surgeon: Alphonsa Overall, MD;  Location: [facility];  Service: General;  Laterality: N/A;  . SALPINGOOPHORECTOMY Right 1999   "w/grapefruit-sized polyp"  . SHOULDER ARTHROSCOPY Right 2003   removal spurs  . SHOULDER ARTHROSCOPY W/ ROTATOR CUFF REPAIR Left 2012  . SINUS EXPLORATION  12/2013   [facility]  . TENDON REPAIR Left 10/2009   torn tendon @ elbow  . VAGINAL HYSTERECTOMY  1978    There were no vitals filed for this visit.      Subjective Assessment - 04/24/17 0849    Subjective ionto patch was helpful. Ankle was ok until she put on shoes- medial aspect presses right under medial maleolus where the pain is.    How long can you walk comfortably? unable other than first 30 min in the morning  [facility] Adult PT Treatment/Exercise - 04/24/17 0001      Knee/Hip Exercises: Aerobic   Nustep 5 min L5     Knee/Hip Exercises: Standing   Heel Raises 20 reps;10 reps     Knee/Hip Exercises: Supine   Bridges with Clamshell 20 reps   Straight Leg Raises 20 reps     Knee/Hip Exercises: Sidelying   Hip ABduction 20 reps;10 reps     Iontophoresis   Type of Iontophoresis Dexamethasone   Location medial ankle    Dose 1cc   Time 6hr wear time     Ankle Exercises: Seated   Other Seated Ankle Exercises AROM inversion                     PT Long Term Goals - 04/03/17 1206      PT LONG TERM GOAL #1   Title FOTO to 34% limitation to indicate significant improvement in functional ability   Baseline 53%  limitation at eval   Time 6   Period Weeks   Status New   Target Date 05/15/17     PT LONG TERM GOAL #2   Title Pt will be able to walk as needed to run errands and complete household chores ankle pain <=2/10   Baseline 6/10 at eval   Time 6   Period Weeks   Status New   Target Date 05/15/17     PT LONG TERM GOAL #3   Title Gross LE MMT 5/5 to provide necessary support to LE biomechanical chain   Baseline see flowsheet   Time 6   Period Weeks   Status New   Target Date 05/15/17     PT LONG TERM GOAL #4   Title Pt will be independent with long term HEP for continued care/strengthening   Baseline will progress as appropraite   Time 6   Period Weeks   Status New   Target Date 05/15/17               Plan - 04/24/17 0925    Clinical Impression Statement Pt reports feeling that pain was a little less after removal of ionto patch and felt pain free while she had it on. Exercises to strengthen proximal stabilizing musculature as well as begin retraining inversion motion of ankle. Ionto patch applied again today.    PT Treatment/Interventions ADLs/Self Care Home Management;Cryotherapy;Electrical Stimulation;Iontophoresis 4mg /ml Dexamethasone;Functional mobility training;Stair training;Gait training;Ultrasound;Traction;Moist Heat;Therapeutic activities;Therapeutic exercise;Balance training;Neuromuscular re-education;Patient/family education;Passive range of motion;Manual techniques;Dry needling;Taping   PT Next Visit Plan hip strengthening   PT Home Exercise Plan heel raises, toe yoga, great toe ext stretch. clam, hip add, prone hip ext, towel scrunches, AROM inversion, bridge with clam   Consulted and Agree with Plan of Care Patient      Patient will benefit from skilled therapeutic intervention in order to improve the following deficits and impairments:  Abnormal gait, Decreased range of motion, Difficulty walking, Increased muscle spasms, Decreased activity tolerance, Pain,  Improper body mechanics, Impaired flexibility, Decreased balance, Decreased strength, Impaired sensation, Postural dysfunction  Visit Diagnosis: Pain in right ankle and joints of right foot  Difficulty in walking, not elsewhere classified  Muscle weakness (generalized)     Problem List Patient Active Problem List   Diagnosis Date Noted  . Family history of colon cancer   . Family history of skin cancer   . Family history of breast cancer   . Posterior tibial tendinitis, right leg 01/29/2017  . Medial epicondylitis of right  elbow 12/11/2016  . Hot flashes 11/12/2016  . Obesity 11/12/2016  . Encounter for Medicare annual wellness exam 11/12/2016  . COPD mixed type ([place]) 06/05/2016  . Costochondritis 03/23/2015  . Hx of adenomatous colonic polyps 01/23/2015  . Medication management 11/21/2014  . Depression   . Hyperlipidemia   . Anemia   . Pre-diabetes   . Vitamin D deficiency   . Tobacco dependence   . Seasonal and perennial allergic rhinitis 07/03/2013    Cyera Balboni C. Emerie Vanderkolk PT, DPT 04/24/17 9:27 AM   [facility] [address] [place], [place], [address] Phone: [phone]   Fax:  [phone]  Name: Karen Barrera MRN: [id number] Date of Birth: 06-23-1951

## 2017-04-28 ENCOUNTER — Ambulatory Visit: Payer: Medicare Other | Attending: Orthopaedic Surgery | Admitting: Physical Therapy

## 2017-04-28 DIAGNOSIS — R262 Difficulty in walking, not elsewhere classified: Secondary | ICD-10-CM | POA: Diagnosis not present

## 2017-04-28 DIAGNOSIS — M6281 Muscle weakness (generalized): Secondary | ICD-10-CM | POA: Diagnosis not present

## 2017-04-28 DIAGNOSIS — M25571 Pain in right ankle and joints of right foot: Secondary | ICD-10-CM | POA: Insufficient documentation

## 2017-04-28 NOTE — Therapy (Signed)
[facility] [place], [place], [address] Phone: [phone]   Fax:  [phone]  Physical Therapy Treatment  Patient Details  Name: Karen Barrera MRN: [id number] Date of Birth: Oct 25, 1950 Referring Provider: Mcarthur Rossetti, MD  Encounter Date: 04/28/2017      PT End of Session - 04/28/17 0854    Visit Number 6   Number of Visits 13   Date for PT Re-Evaluation 05/15/17   Authorization Type MCR- KX at visit 15   PT Start Time 0848   PT Stop Time 0926   PT Time Calculation (min) 38 min      Past Medical History:  Diagnosis Date  . Acute perforated appendicitis 10/31/2014  . Allergic rhinitis   . Allergy   . Anemia   . Arthritis    "fingers" (10/31/2014)  . Chronic bronchitis ([place])    "got it q yr til I had my nose OR" (10/31/2014)  . Colon polyp   . DDD (degenerative disc disease), cervical   . DDD (degenerative disc disease), lumbar   . Depression    "short term when my sister passed away unexpectedly"  . Family history of adverse reaction to anesthesia    "mother is hard to wake up; a little goes a long way w/her"  . Family history of breast cancer   . Family history of colon cancer   . Family history of skin cancer   . GERD (gastroesophageal reflux disease)   . History of stomach ulcers   . Hyperlipidemia    "borderline; RX is preventative" (10/31/2014)  . Kidney stones    "they passed"  . Migraine    "used to get them alot; don't get them anymore" (10/31/2014)  . Osteoarthritis of both knees   . Osteopenia   . Pneumonia 2003 X 1  . Pre-diabetes   . Seizures ([place]) ~ 1962 X 1   "from an abscessed wisdom tooth"  . Tobacco dependence   . Vitamin D deficiency     Past Surgical History:  Procedure Laterality Date  . APPENDECTOMY    . ARTHROTOMY Right 01/11/2016   Procedure: DISTAL INTERPHALANGEAL JOINT ARTHROTOMY RIGHT INDEX FINGER;  Surgeon: Leanora Cover, MD;  Location: [facility];  Service: Orthopedics;  Laterality: Right;  . COLONOSCOPY    . CYST EXCISION Right 01/11/2016   Procedure: EXCISION MUCOID CYST;  Surgeon: Leanora Cover, MD;  Location: [facility];  Service: Orthopedics;  Laterality: Right;  . LAPAROSCOPIC APPENDECTOMY N/A 10/31/2014   Procedure: APPENDECTOMY LAPAROSCOPIC;  Surgeon: Alphonsa Overall, MD;  Location: [facility];  Service: General;  Laterality: N/A;  . SALPINGOOPHORECTOMY Right 1999   "w/grapefruit-sized polyp"  . SHOULDER ARTHROSCOPY Right 2003   removal spurs  . SHOULDER ARTHROSCOPY W/ ROTATOR CUFF REPAIR Left 2012  . SINUS EXPLORATION  12/2013   [place]  . TENDON REPAIR Left 10/2009   torn tendon @ elbow  . VAGINAL HYSTERECTOMY  1978    There were no vitals filed for this visit.      Subjective Assessment - 04/28/17 0851    Subjective It feels a little better.    Currently in Pain? Yes   Pain Score 3    Pain Location Ankle   Pain Orientation Right;Medial   Pain Descriptors / Indicators Sore;Aching                         [facility] Adult PT Treatment/Exercise - 04/28/17 0001  Knee/Hip Exercises: Stretches   Piriformis Stretch Limitations figure 4 tightness on right 3 x 30 sec      Knee/Hip Exercises: Aerobic   Recumbent Bike Rec bike L2 x 5      Knee/Hip Exercises: Supine   Bridges with Clamshell 20 reps   Straight Leg Raises 20 reps     Knee/Hip Exercises: Sidelying   Clams x 20 each  green     Iontophoresis   Type of Iontophoresis Dexamethasone   Location medial ankle    Dose 1cc   Time 6hr wear time     Ankle Exercises: Seated   BAPS Level 3   Other Seated Ankle Exercises AROM inversion, inversion isometrics with ball bilateral      Ankle Exercises: Supine   T-Band yellow band x 15 inversion    Other Supine Ankle Exercises inversion while in modified figure 4    Other Supine Ankle Exercises ball isometrics x 15                      PT Long Term Goals - 04/03/17  1206      PT LONG TERM GOAL #1   Title FOTO to 34% limitation to indicate significant improvement in functional ability   Baseline 53% limitation at eval   Time 6   Period Weeks   Status New   Target Date 05/15/17     PT LONG TERM GOAL #2   Title Pt will be able to walk as needed to run errands and complete household chores ankle pain <=2/10   Baseline 6/10 at eval   Time 6   Period Weeks   Status New   Target Date 05/15/17     PT LONG TERM GOAL #3   Title Gross LE MMT 5/5 to provide necessary support to LE biomechanical chain   Baseline see flowsheet   Time 6   Period Weeks   Status New   Target Date 05/15/17     PT LONG TERM GOAL #4   Title Pt will be independent with long term HEP for continued care/strengthening   Baseline will progress as appropraite   Time 6   Period Weeks   Status New   Target Date 05/15/17               Plan - 04/28/17 0851    Clinical Impression Statement Swelling has decreased overall, still swells if on feet for a long time. Pain averages a 3-4/10 and is not reaching 6-7/10 lately. Continued inversion AROM and gentle strenthening as well as hip strengthening. Repeated ionto patch to medial ankle.    PT Next Visit Plan hip strengthening   PT Home Exercise Plan heel raises, toe yoga, great toe ext stretch. clam, hip add, prone hip ext, towel scrunches, AROM inversion, bridge with clam   Consulted and Agree with Plan of Care Patient      Patient will benefit from skilled therapeutic intervention in order to improve the following deficits and impairments:  Abnormal gait, Decreased range of motion, Difficulty walking, Increased muscle spasms, Decreased activity tolerance, Pain, Improper body mechanics, Impaired flexibility, Decreased balance, Decreased strength, Impaired sensation, Postural dysfunction  Visit Diagnosis: Pain in right ankle and joints of right foot  Difficulty in walking, not elsewhere classified  Muscle weakness  (generalized)     Problem List Patient Active Problem List   Diagnosis Date Noted  . Genetic testing 04/24/2017  . Family history of colon cancer   .  Family history of skin cancer   . Family history of breast cancer   . Posterior tibial tendinitis, right leg 01/29/2017  . Medial epicondylitis of right elbow 12/11/2016  . Hot flashes 11/12/2016  . Obesity 11/12/2016  . Encounter for Medicare annual wellness exam 11/12/2016  . COPD mixed type ([place]) 06/05/2016  . Costochondritis 03/23/2015  . Hx of adenomatous colonic polyps 01/23/2015  . Medication management 11/21/2014  . Depression   . Hyperlipidemia   . Anemia   . Pre-diabetes   . Vitamin D deficiency   . Tobacco dependence   . Seasonal and perennial allergic rhinitis 07/03/2013    Dorene Ar, PTA 04/28/2017, 9:32 AM  [facility] [address] [place], [place], [address] Phone: [phone]   Fax:  [phone]  Name: Karen Barrera MRN: [id number] Date of Birth: [birth year]

## 2017-05-01 ENCOUNTER — Ambulatory Visit: Payer: Medicare Other | Admitting: Physical Therapy

## 2017-05-01 ENCOUNTER — Encounter: Payer: Self-pay | Admitting: Physical Therapy

## 2017-05-01 DIAGNOSIS — M6281 Muscle weakness (generalized): Secondary | ICD-10-CM

## 2017-05-01 DIAGNOSIS — M25571 Pain in right ankle and joints of right foot: Secondary | ICD-10-CM | POA: Diagnosis not present

## 2017-05-01 DIAGNOSIS — R262 Difficulty in walking, not elsewhere classified: Secondary | ICD-10-CM | POA: Diagnosis not present

## 2017-05-01 NOTE — Therapy (Signed)
[place], [place], [address] Phone: [phone]   Fax:  [phone]  Physical Therapy Treatment  Patient Details  Name: Karen Barrera MRN: [id number] Date of Birth: Feb 06, 1951 Referring Provider: Mcarthur Rossetti, MD  Encounter Date: 05/01/2017      PT End of Session - 05/01/17 0758    Visit Number 7   Number of Visits 13   Date for PT Re-Evaluation 05/15/17   Authorization Type MCR- KX at visit 15   PT Start Time 0758   PT Stop Time 0841   PT Time Calculation (min) 43 min   Activity Tolerance Patient tolerated treatment well   Behavior During Therapy [facility] for tasks assessed/performed      Past Medical History:  Diagnosis Date  . Acute perforated appendicitis 10/31/2014  . Allergic rhinitis   . Allergy   . Anemia   . Arthritis    "fingers" (10/31/2014)  . Chronic bronchitis ([place])    "got it q yr til I had my nose OR" (10/31/2014)  . Colon polyp   . DDD (degenerative disc disease), cervical   . DDD (degenerative disc disease), lumbar   . Depression    "short term when my sister passed away unexpectedly"  . Family history of adverse reaction to anesthesia    "mother is hard to wake up; a little goes a long way w/her"  . Family history of breast cancer   . Family history of colon cancer   . Family history of skin cancer   . GERD (gastroesophageal reflux disease)   . History of stomach ulcers   . Hyperlipidemia    "borderline; RX is preventative" (10/31/2014)  . Kidney stones    "they passed"  . Migraine    "used to get them alot; don't get them anymore" (10/31/2014)  . Osteoarthritis of both knees   . Osteopenia   . Pneumonia 2003 X 1  . Pre-diabetes   . Seizures ([place]) ~ 1962 X 1   "from an abscessed wisdom tooth"  . Tobacco dependence   . Vitamin D deficiency     Past Surgical History:  Procedure Laterality Date  . APPENDECTOMY    . ARTHROTOMY Right 01/11/2016   Procedure:  DISTAL INTERPHALANGEAL JOINT ARTHROTOMY RIGHT INDEX FINGER;  Surgeon: Leanora Cover, MD;  Location: [facility];  Service: Orthopedics;  Laterality: Right;  . COLONOSCOPY    . CYST EXCISION Right 01/11/2016   Procedure: EXCISION MUCOID CYST;  Surgeon: Leanora Cover, MD;  Location: [facility];  Service: Orthopedics;  Laterality: Right;  . LAPAROSCOPIC APPENDECTOMY N/A 10/31/2014   Procedure: APPENDECTOMY LAPAROSCOPIC;  Surgeon: Alphonsa Overall, MD;  Location: [facility];  Service: General;  Laterality: N/A;  . SALPINGOOPHORECTOMY Right 1999   "w/grapefruit-sized polyp"  . SHOULDER ARTHROSCOPY Right 2003   removal spurs  . SHOULDER ARTHROSCOPY W/ ROTATOR CUFF REPAIR Left 2012  . SINUS EXPLORATION  12/2013   [place]  . TENDON REPAIR Left 10/2009   torn tendon @ elbow  . VAGINAL HYSTERECTOMY  1978    There were no vitals filed for this visit.      Subjective Assessment - 05/01/17 0758    Subjective Doing pretty well, just a twinge. Sometimes it doesn't matter which way it is turned, it just has a pain in it. Wearing low sketcher shoes today which hurt, is going to try to find high top shoes.    Currently in Pain? Yes   Pain Score  1    Pain Location Ankle   Pain Orientation Right;Mid                         [facility] Adult PT Treatment/Exercise - 05/01/17 0001      Knee/Hip Exercises: Stretches   Hip Flexor Stretch Limitations R thomas test position     Knee/Hip Exercises: Aerobic   Nustep 5 min L3     Knee/Hip Exercises: Supine   Bridges with Ball Squeeze 20 reps  weight in ball of foot     Iontophoresis   Type of Iontophoresis Dexamethasone   Location medial ankle    Dose 1cc   Time 6hr wear time     Manual Therapy   Manual Therapy Soft tissue mobilization   Soft tissue mobilization trigger point release R TFL, IASTM R TFL ITB, VL     Ankle Exercises: Standing   Other Standing Ankle Exercises wobble board (blue) fwd & lat, static & dynamic      Ankle Exercises: Seated   Other Seated Ankle Exercises seated forefoot press into green airex     Ankle Exercises: Supine   Other Supine Ankle Exercises AROM DF                     PT Long Term Goals - 04/03/17 1206      PT LONG TERM GOAL #1   Title FOTO to 34% limitation to indicate significant improvement in functional ability   Baseline 53% limitation at eval   Time 6   Period Weeks   Status New   Target Date 05/15/17     PT LONG TERM GOAL #2   Title Pt will be able to walk as needed to run errands and complete household chores ankle pain <=2/10   Baseline 6/10 at eval   Time 6   Period Weeks   Status New   Target Date 05/15/17     PT LONG TERM GOAL #3   Title Gross LE MMT 5/5 to provide necessary support to LE biomechanical chain   Baseline see flowsheet   Time 6   Period Weeks   Status New   Target Date 05/15/17     PT LONG TERM GOAL #4   Title Pt will be independent with long term HEP for continued care/strengthening   Baseline will progress as appropraite   Time 6   Period Weeks   Status New   Target Date 05/15/17               Plan - 05/01/17 0843    Clinical Impression Statement Pt with pain during some of the exerices and explained that as we begin retraining she should expect some soreness. Reported it didn't feel too bad after treatment. applied ionto patch following treatment.    PT Treatment/Interventions ADLs/Self Care Home Management;Cryotherapy;Electrical Stimulation;Iontophoresis 4mg /ml Dexamethasone;Functional mobility training;Stair training;Gait training;Ultrasound;Traction;Moist Heat;Therapeutic activities;Therapeutic exercise;Balance training;Neuromuscular re-education;Patient/family education;Passive range of motion;Manual techniques;Dry needling;Taping   PT Next Visit Plan hip strengthening + core, standing balance   PT Home Exercise Plan heel raises, toe yoga, great toe ext stretch. clam, hip add, prone hip ext, towel  scrunches, AROM inversion, bridge with clam; bridge with ball, thomas test stretch, SLS   Consulted and Agree with Plan of Care Patient      Patient will benefit from skilled therapeutic intervention in order to improve the following deficits and impairments:  Abnormal gait, Decreased range of motion, Difficulty walking, Increased  muscle spasms, Decreased activity tolerance, Pain, Improper body mechanics, Impaired flexibility, Decreased balance, Decreased strength, Impaired sensation, Postural dysfunction  Visit Diagnosis: Pain in right ankle and joints of right foot  Difficulty in walking, not elsewhere classified  Muscle weakness (generalized)     Problem List Patient Active Problem List   Diagnosis Date Noted  . Genetic testing 04/24/2017  . Family history of colon cancer   . Family history of skin cancer   . Family history of breast cancer   . Posterior tibial tendinitis, right leg 01/29/2017  . Medial epicondylitis of right elbow 12/11/2016  . Hot flashes 11/12/2016  . Obesity 11/12/2016  . Encounter for Medicare annual wellness exam 11/12/2016  . COPD mixed type ([place]) 06/05/2016  . Costochondritis 03/23/2015  . Hx of adenomatous colonic polyps 01/23/2015  . Medication management 11/21/2014  . Depression   . Hyperlipidemia   . Anemia   . Pre-diabetes   . Vitamin D deficiency   . Tobacco dependence   . Seasonal and perennial allergic rhinitis 07/03/2013    Amaris Garrette C. Mathis Cashman PT, DPT 05/01/17 8:47 AM   [facility] [address] [place], [place], [address] Phone: [phone]   Fax:  [phone]  Name: Everette Dimauro MRN: [id number] Date of Birth: [birth year]

## 2017-05-05 ENCOUNTER — Ambulatory Visit: Payer: Medicare Other | Admitting: Physical Therapy

## 2017-05-05 DIAGNOSIS — R262 Difficulty in walking, not elsewhere classified: Secondary | ICD-10-CM | POA: Diagnosis not present

## 2017-05-05 DIAGNOSIS — M6281 Muscle weakness (generalized): Secondary | ICD-10-CM

## 2017-05-05 DIAGNOSIS — M25571 Pain in right ankle and joints of right foot: Secondary | ICD-10-CM

## 2017-05-05 NOTE — Therapy (Signed)
[facility] [place], [place], [address] Phone: [phone]   Fax:  [phone]  Physical Therapy Treatment  Patient Details  Name: Karen Barrera MRN: [id number] Date of Birth: [birth year] Referring Provider: Mcarthur Rossetti, MD  Encounter Date: 05/05/2017      PT End of Session - 05/05/17 0848    Visit Number 8   Number of Visits 13   Date for PT Re-Evaluation 05/15/17   Authorization Type MCR- KX at visit 15   PT Start Time 0845   PT Stop Time 0930   PT Time Calculation (min) 45 min      Past Medical History:  Diagnosis Date  . Acute perforated appendicitis 10/31/2014  . Allergic rhinitis   . Allergy   . Anemia   . Arthritis    "fingers" (10/31/2014)  . Chronic bronchitis ([place])    "got it q yr til I had my nose OR" (10/31/2014)  . Colon polyp   . DDD (degenerative disc disease), cervical   . DDD (degenerative disc disease), lumbar   . Depression    "short term when my sister passed away unexpectedly"  . Family history of adverse reaction to anesthesia    "mother is hard to wake up; a little goes a long way w/her"  . Family history of breast cancer   . Family history of colon cancer   . Family history of skin cancer   . GERD (gastroesophageal reflux disease)   . History of stomach ulcers   . Hyperlipidemia    "borderline; RX is preventative" (10/31/2014)  . Kidney stones    "they passed"  . Migraine    "used to get them alot; don't get them anymore" (10/31/2014)  . Osteoarthritis of both knees   . Osteopenia   . Pneumonia 2003 X 1  . Pre-diabetes   . Seizures ([place]) ~ 1962 X 1   "from an abscessed wisdom tooth"  . Tobacco dependence   . Vitamin D deficiency     Past Surgical History:  Procedure Laterality Date  . APPENDECTOMY    . ARTHROTOMY Right 01/11/2016   Procedure: DISTAL INTERPHALANGEAL JOINT ARTHROTOMY RIGHT INDEX FINGER;  Surgeon: Leanora Cover, MD;  Location: [facility];  Service: Orthopedics;  Laterality: Right;  . COLONOSCOPY    . CYST EXCISION Right 01/11/2016   Procedure: EXCISION MUCOID CYST;  Surgeon: Leanora Cover, MD;  Location: [facility];  Service: Orthopedics;  Laterality: Right;  . LAPAROSCOPIC APPENDECTOMY N/A 10/31/2014   Procedure: APPENDECTOMY LAPAROSCOPIC;  Surgeon: Alphonsa Overall, MD;  Location: [facility];  Service: General;  Laterality: N/A;  . SALPINGOOPHORECTOMY Right 1999   "w/grapefruit-sized polyp"  . SHOULDER ARTHROSCOPY Right 2003   removal spurs  . SHOULDER ARTHROSCOPY W/ ROTATOR CUFF REPAIR Left 2012  . SINUS EXPLORATION  12/2013   [place]  . TENDON REPAIR Left 10/2009   torn tendon @ elbow  . VAGINAL HYSTERECTOMY  1978    There were no vitals filed for this visit.      Subjective Assessment - 05/05/17 0847    Subjective My ankle was angry yesterday, I was up on my feet alot  the day before.    Currently in Pain? Yes   Pain Score 5    Pain Location Ankle   Pain Orientation Right;Medial   Pain Descriptors / Indicators Aching;Sore   Aggravating Factors  shoe pressure, on feet prolonged    Pain Relieving Factors muscle rub, ibuprofen  [facility] Adult PT Treatment/Exercise - 05/05/17 0001      Knee/Hip Exercises: Stretches   Hip Flexor Stretch Limitations R thomas test position   Other Knee/Hip Stretches slant board x 1 minute      Knee/Hip Exercises: Aerobic   Recumbent Bike Rec bike L3 x 5      Knee/Hip Exercises: Standing   Heel Raises 20 reps;10 reps   Heel Raises Limitations toe raises and heel raises     SLS pain   Other Standing Knee Exercises Tandem balance trials without UE support, increased pain with LLE back.      Knee/Hip Exercises: Supine   Bridges with Ball Squeeze 20 reps  weight in ball of foot     Knee/Hip Exercises: Sidelying   Hip ABduction 20 reps     Knee/Hip Exercises: Prone   Hamstring Curl Limitations iso HS curl + hip extension, 10  x 2 each      Iontophoresis   Type of Iontophoresis Dexamethasone   Location medial ankle    Dose 1cc   Time 6hr wear time     Ankle Exercises: Supine   T-Band yellow band x 15 inversion , eversion and DF    Other Supine Ankle Exercises ball isometrics x 15                 PT Education - 05/05/17 0933    Education provided Yes   Education Details HEP, stop if painful    Person(s) Educated Patient   Methods Explanation;Handout   Comprehension Verbalized understanding             PT Long Term Goals - 04/03/17 1206      PT LONG TERM GOAL #1   Title FOTO to 34% limitation to indicate significant improvement in functional ability   Baseline 53% limitation at eval   Time 6   Period Weeks   Status New   Target Date 05/15/17     PT LONG TERM GOAL #2   Title Pt will be able to walk as needed to run errands and complete household chores ankle pain <=2/10   Baseline 6/10 at eval   Time 6   Period Weeks   Status New   Target Date 05/15/17     PT LONG TERM GOAL #3   Title Gross LE MMT 5/5 to provide necessary support to LE biomechanical chain   Baseline see flowsheet   Time 6   Period Weeks   Status New   Target Date 05/15/17     PT LONG TERM GOAL #4   Title Pt will be independent with long term HEP for continued care/strengthening   Baseline will progress as appropraite   Time 6   Period Weeks   Status New   Target Date 05/15/17               Plan - 05/05/17 0933    Clinical Impression Statement Pt with increased pain due to prolonged activity on feet 2 days ago. We challenged standing balance which increased pain. Tandem was tolerated better than SLS. Pt wearing thick soled flip flops today. She still has not gone for her orthotics. Encouragement provided and location recommended to have her orthotics made. Began yellow band seated inversion and eversion with pt feeling pain with inversion. Pt given band for HEP and cautioned not to cause pain.  Also instructed pt in self ice masage which pt reported was very helpful today. Reapplied ionto for medial ankle pain.  PT Next Visit Plan hip strengthening + core, standing balance; asses response to new HEP    PT Home Exercise Plan heel raises, toe yoga, great toe ext stretch. clam, hip add, prone hip ext, towel scrunches, AROM inversion, bridge with clam; bridge with ball, thomas test stretch, SLS   Consulted and Agree with Plan of Care Patient      Patient will benefit from skilled therapeutic intervention in order to improve the following deficits and impairments:  Abnormal gait, Decreased range of motion, Difficulty walking, Increased muscle spasms, Decreased activity tolerance, Pain, Improper body mechanics, Impaired flexibility, Decreased balance, Decreased strength, Impaired sensation, Postural dysfunction  Visit Diagnosis: Pain in right ankle and joints of right foot  Difficulty in walking, not elsewhere classified  Muscle weakness (generalized)     Problem List Patient Active Problem List   Diagnosis Date Noted  . Genetic testing 04/24/2017  . Family history of colon cancer   . Family history of skin cancer   . Family history of breast cancer   . Posterior tibial tendinitis, right leg 01/29/2017  . Medial epicondylitis of right elbow 12/11/2016  . Hot flashes 11/12/2016  . Obesity 11/12/2016  . Encounter for Medicare annual wellness exam 11/12/2016  . COPD mixed type ([place]) 06/05/2016  . Costochondritis 03/23/2015  . Hx of adenomatous colonic polyps 01/23/2015  . Medication management 11/21/2014  . Depression   . Hyperlipidemia   . Anemia   . Pre-diabetes   . Vitamin D deficiency   . Tobacco dependence   . Seasonal and perennial allergic rhinitis 07/03/2013    Dorene Ar, PTA 05/05/2017, 9:43 AM  [facility] [address] [place], [place], [address] Phone: [phone]   Fax:   [phone]  Name: Karen Barrera MRN: [id number] Date of Birth: [birth year]

## 2017-05-08 ENCOUNTER — Encounter: Payer: Self-pay | Admitting: Physical Therapy

## 2017-05-08 ENCOUNTER — Ambulatory Visit: Payer: Medicare Other | Admitting: Physical Therapy

## 2017-05-08 DIAGNOSIS — M6281 Muscle weakness (generalized): Secondary | ICD-10-CM

## 2017-05-08 DIAGNOSIS — R262 Difficulty in walking, not elsewhere classified: Secondary | ICD-10-CM | POA: Diagnosis not present

## 2017-05-08 DIAGNOSIS — M25571 Pain in right ankle and joints of right foot: Secondary | ICD-10-CM | POA: Diagnosis not present

## 2017-05-08 NOTE — Therapy (Signed)
[place], [place], [address] Phone: [phone]   Fax:  [phone]  Physical Therapy Treatment  Patient Details  Name: Karen Barrera MRN: [id number] Date of Birth: 1950/11/14 Referring Provider: Mcarthur Rossetti, MD  Encounter Date: 05/08/2017      PT End of Session - 05/08/17 0844    Visit Number 9   Number of Visits 13   Date for PT Re-Evaluation 05/15/17   Authorization Type MCR- KX at visit 15   PT Start Time 0844   PT Stop Time 0924   PT Time Calculation (min) 40 min   Activity Tolerance Patient tolerated treatment well   Behavior During Therapy [facility] for tasks assessed/performed      Past Medical History:  Diagnosis Date  . Acute perforated appendicitis 10/31/2014  . Allergic rhinitis   . Allergy   . Anemia   . Arthritis    "fingers" (10/31/2014)  . Chronic bronchitis ([place])    "got it q yr til I had my nose OR" (10/31/2014)  . Colon polyp   . DDD (degenerative disc disease), cervical   . DDD (degenerative disc disease), lumbar   . Depression    "short term when my sister passed away unexpectedly"  . Family history of adverse reaction to anesthesia    "mother is hard to wake up; a little goes a long way w/her"  . Family history of breast cancer   . Family history of colon cancer   . Family history of skin cancer   . GERD (gastroesophageal reflux disease)   . History of stomach ulcers   . Hyperlipidemia    "borderline; RX is preventative" (10/31/2014)  . Kidney stones    "they passed"  . Migraine    "used to get them alot; don't get them anymore" (10/31/2014)  . Osteoarthritis of both knees   . Osteopenia   . Pneumonia 2003 X 1  . Pre-diabetes   . Seizures ([place]) ~ 1962 X 1   "from an abscessed wisdom tooth"  . Tobacco dependence   . Vitamin D deficiency     Past Surgical History:  Procedure Laterality Date  . APPENDECTOMY    . ARTHROTOMY Right 01/11/2016   Procedure:  DISTAL INTERPHALANGEAL JOINT ARTHROTOMY RIGHT INDEX FINGER;  Surgeon: Leanora Cover, MD;  Location: [place];  Service: Orthopedics;  Laterality: Right;  . COLONOSCOPY    . CYST EXCISION Right 01/11/2016   Procedure: EXCISION MUCOID CYST;  Surgeon: Leanora Cover, MD;  Location: [place];  Service: Orthopedics;  Laterality: Right;  . LAPAROSCOPIC APPENDECTOMY N/A 10/31/2014   Procedure: APPENDECTOMY LAPAROSCOPIC;  Surgeon: Alphonsa Overall, MD;  Location: [place];  Service: General;  Laterality: N/A;  . SALPINGOOPHORECTOMY Right 1999   "w/grapefruit-sized polyp"  . SHOULDER ARTHROSCOPY Right 2003   removal spurs  . SHOULDER ARTHROSCOPY W/ ROTATOR CUFF REPAIR Left 2012  . SINUS EXPLORATION  12/2013   [place]  . TENDON REPAIR Left 10/2009   torn tendon @ elbow  . VAGINAL HYSTERECTOMY  1978    There were no vitals filed for this visit.      Subjective Assessment - 05/08/17 0844    Subjective Feels okay today. Wore tennis shoes which created pain the other day.                          [place] Adult PT Treatment/Exercise - 05/08/17 0001  Knee/Hip Exercises: Aerobic   Nustep 5 min L5     Knee/Hip Exercises: Supine   Bridges Limitations straight legs over physioball   Other Supine Knee/Hip Exercises ball bw feet, physioball roll in   Other Supine Knee/Hip Exercises V-up ball switch     Iontophoresis   Type of Iontophoresis Dexamethasone   Location medial ankle    Dose 1cc   Time 6hr wear time     Ankle Exercises: Seated   Towel Crunch 2 reps   Marble Pickup 1 cup   Other Seated Ankle Exercises long sitting isometric inversion ball 5x 10s     Ankle Exercises: Stretches   Other Stretch toe extension stretch                PT Education - 05/08/17 0857    Education provided Yes   Education Details exercise form/rationale, shoe wear, types of arch support   Person(s) Educated Patient   Methods Explanation;Demonstration;Tactile  cues;Verbal cues   Comprehension Verbalized understanding;Returned demonstration;Verbal cues required;Tactile cues required;Need further instruction             PT Long Term Goals - 04/03/17 1206      PT LONG TERM GOAL #1   Title FOTO to 34% limitation to indicate significant improvement in functional ability   Baseline 53% limitation at eval   Time 6   Period Weeks   Status New   Target Date 05/15/17     PT LONG TERM GOAL #2   Title Pt will be able to walk as needed to run errands and complete household chores ankle pain <=2/10   Baseline 6/10 at eval   Time 6   Period Weeks   Status New   Target Date 05/15/17     PT LONG TERM GOAL #3   Title Gross LE MMT 5/5 to provide necessary support to LE biomechanical chain   Baseline see flowsheet   Time 6   Period Weeks   Status New   Target Date 05/15/17     PT LONG TERM GOAL #4   Title Pt will be independent with long term HEP for continued care/strengthening   Baseline will progress as appropraite   Time 6   Period Weeks   Status New   Target Date 05/15/17               Plan - 05/08/17 0924    Clinical Impression Statement Good tolerance to exercises today without increase in pain. Exercises today to increased challenge to proximal stabilizing musculature and deep foot intrinsics. Is planning to go shoe shopping today.    PT Treatment/Interventions ADLs/Self Care Home Management;Cryotherapy;Electrical Stimulation;Iontophoresis 4mg /ml Dexamethasone;Functional mobility training;Stair training;Gait training;Ultrasound;Traction;Moist Heat;Therapeutic activities;Therapeutic exercise;Balance training;Neuromuscular re-education;Patient/family education;Passive range of motion;Manual techniques;Dry needling;Taping   PT Next Visit Plan core/hip strength, did she find shoes?   PT Home Exercise Plan heel raises, toe yoga, great toe ext stretch. clam, hip add, prone hip ext, towel scrunches, AROM inversion, bridge with clam;  bridge with ball, thomas test stretch, SLS; v-up ball switch, isometric inversion ball squeeze   Consulted and Agree with Plan of Care Patient      Patient will benefit from skilled therapeutic intervention in order to improve the following deficits and impairments:  Abnormal gait, Decreased range of motion, Difficulty walking, Increased muscle spasms, Decreased activity tolerance, Pain, Improper body mechanics, Impaired flexibility, Decreased balance, Decreased strength, Impaired sensation, Postural dysfunction  Visit Diagnosis: Pain in right ankle and joints of right foot  Difficulty in walking, not elsewhere classified  Muscle weakness (generalized)     Problem List Patient Active Problem List   Diagnosis Date Noted  . Genetic testing 04/24/2017  . Family history of colon cancer   . Family history of skin cancer   . Family history of breast cancer   . Posterior tibial tendinitis, right leg 01/29/2017  . Medial epicondylitis of right elbow 12/11/2016  . Hot flashes 11/12/2016  . Obesity 11/12/2016  . Encounter for Medicare annual wellness exam 11/12/2016  . COPD mixed type ([place]) 06/05/2016  . Costochondritis 03/23/2015  . Hx of adenomatous colonic polyps 01/23/2015  . Medication management 11/21/2014  . Depression   . Hyperlipidemia   . Anemia   . Pre-diabetes   . Vitamin D deficiency   . Tobacco dependence   . Seasonal and perennial allergic rhinitis 07/03/2013   Shanina Kepple C. Tammye Kahler PT, DPT 05/08/17 9:27 AM   [facility] [address] [place], [place], [address] Phone: [phone]   Fax:  [phone]  Name: Karen Barrera MRN: [id number] Date of Birth: 03-Dec-1950

## 2017-05-11 ENCOUNTER — Other Ambulatory Visit: Payer: Self-pay | Admitting: Internal Medicine

## 2017-05-11 DIAGNOSIS — J449 Chronic obstructive pulmonary disease, unspecified: Secondary | ICD-10-CM

## 2017-05-11 MED ORDER — ALBUTEROL SULFATE HFA 108 (90 BASE) MCG/ACT IN AERS: INHALATION_SPRAY | RESPIRATORY_TRACT | 3 refills | Status: DC

## 2017-05-12 ENCOUNTER — Ambulatory Visit: Payer: Medicare Other | Admitting: Physical Therapy

## 2017-05-12 DIAGNOSIS — M6281 Muscle weakness (generalized): Secondary | ICD-10-CM | POA: Diagnosis not present

## 2017-05-12 DIAGNOSIS — R262 Difficulty in walking, not elsewhere classified: Secondary | ICD-10-CM | POA: Diagnosis not present

## 2017-05-12 DIAGNOSIS — M25571 Pain in right ankle and joints of right foot: Secondary | ICD-10-CM | POA: Diagnosis not present

## 2017-05-12 NOTE — Therapy (Signed)
[place], [place], [address] Phone: [phone]   Fax:  [phone]  Physical Therapy Treatment  Patient Details  Name: Karen Barrera MRN: [id number] Date of Birth: 11/09/1950 Referring Provider: Mcarthur Rossetti, MD  Encounter Date: 05/12/2017      PT End of Session - 05/12/17 1135    Visit Number 10   Number of Visits 13   Date for PT Re-Evaluation 05/15/17   Authorization Type MCR- KX at visit 15   PT Start Time 0845   PT Stop Time 0930   PT Time Calculation (min) 45 min      Past Medical History:  Diagnosis Date  . Acute perforated appendicitis 10/31/2014  . Allergic rhinitis   . Allergy   . Anemia   . Arthritis    "fingers" (10/31/2014)  . Chronic bronchitis ([place])    "got it q yr til I had my nose OR" (10/31/2014)  . Colon polyp   . DDD (degenerative disc disease), cervical   . DDD (degenerative disc disease), lumbar   . Depression    "short term when my sister passed away unexpectedly"  . Family history of adverse reaction to anesthesia    "mother is hard to wake up; a little goes a long way w/her"  . Family history of breast cancer   . Family history of colon cancer   . Family history of skin cancer   . GERD (gastroesophageal reflux disease)   . History of stomach ulcers   . Hyperlipidemia    "borderline; RX is preventative" (10/31/2014)  . Kidney stones    "they passed"  . Migraine    "used to get them alot; don't get them anymore" (10/31/2014)  . Osteoarthritis of both knees   . Osteopenia   . Pneumonia 2003 X 1  . Pre-diabetes   . Seizures ([place]) ~ 1962 X 1   "from an abscessed wisdom tooth"  . Tobacco dependence   . Vitamin D deficiency     Past Surgical History:  Procedure Laterality Date  . APPENDECTOMY    . ARTHROTOMY Right 01/11/2016   Procedure: DISTAL INTERPHALANGEAL JOINT ARTHROTOMY RIGHT INDEX FINGER;  Surgeon: Leanora Cover, MD;  Location: [place];  Service: Orthopedics;  Laterality: Right;  . COLONOSCOPY    . CYST EXCISION Right 01/11/2016   Procedure: EXCISION MUCOID CYST;  Surgeon: Leanora Cover, MD;  Location: [place];  Service: Orthopedics;  Laterality: Right;  . LAPAROSCOPIC APPENDECTOMY N/A 10/31/2014   Procedure: APPENDECTOMY LAPAROSCOPIC;  Surgeon: Alphonsa Overall, MD;  Location: [place];  Service: General;  Laterality: N/A;  . SALPINGOOPHORECTOMY Right 1999   "w/grapefruit-sized polyp"  . SHOULDER ARTHROSCOPY Right 2003   removal spurs  . SHOULDER ARTHROSCOPY W/ ROTATOR CUFF REPAIR Left 2012  . SINUS EXPLORATION  12/2013   [place]  . TENDON REPAIR Left 10/2009   torn tendon @ elbow  . VAGINAL HYSTERECTOMY  1978    There were no vitals filed for this visit.          [facility] PT Assessment - 05/12/17 0001      Observation/Other Assessments   Focus on Therapeutic Outcomes (FOTO)  31 % limited improved from 53% limited.                      [place] Adult PT Treatment/Exercise - 05/12/17 0001      Knee/Hip Exercises: Stretches   Other Knee/Hip Stretches slant board  x 1 minute      Knee/Hip Exercises: Aerobic   Recumbent Bike Rec bike L3 x 5      Knee/Hip Exercises: Standing   Heel Raises 20 reps;10 reps   Heel Raises Limitations toe raises and heel raises     SLS less painful 5 sec best    Other Standing Knee Exercises Tandem balance trials without UE support, each foot forward, narrow base balance on foam with head turns and trunk rotations     Knee/Hip Exercises: Supine   Bridges Limitations bridge with feet on ball    Other Supine Knee/Hip Exercises ball bw feet, physioball roll in   Other Supine Knee/Hip Exercises V-up ball switch     Iontophoresis   Type of Iontophoresis Dexamethasone   Location medial ankle    Dose 1cc   Time 6hr wear time     Manual Therapy   Manual therapy comments ice massage x 3 minutes      Ankle Exercises: Supine   T-Band red band 4 way     Other Supine Ankle Exercises ball isometrics x 15      Ankle Exercises: Seated   Towel Crunch 2 reps   Marble Pickup 1 cup   Other Seated Ankle Exercises long sitting isometric inversion ball 5x 10s                     PT Long Term Goals - 05/12/17 1138      PT LONG TERM GOAL #1   Title FOTO to 34% limitation to indicate significant improvement in functional ability   Baseline 31%    Time 6   Period Weeks   Status Achieved     PT LONG TERM GOAL #2   Title Pt will be able to walk as needed to run errands and complete household chores ankle pain <=2/10   Time 6   Period Weeks   Status Unable to assess     PT LONG TERM GOAL #3   Title Gross LE MMT 5/5 to provide necessary support to LE biomechanical chain   Time 6   Period Weeks   Status Unable to assess     PT LONG TERM GOAL #4   Title Pt will be independent with long term HEP for continued care/strengthening   Baseline will progress as appropraite   Time 6   Period Weeks   Status On-going               Plan - 05/12/17 0858    Clinical Impression Statement Pt did not have time to shop for shoes. She is wearing boat shoes today which do not rub her medial ankle. Pain has been less over the last couple of days at a 1/10. Improved tolerance to standing balance exercises with less pain. Pain a little flared at end of treatment so repeated ice massage and ionto. Pt reports ice massage at home has been helpful. FOTO score improved. LTG#1 met. Will assess other LTGs and dc next visit.    PT Next Visit Plan core/hip strength, did she find shoes? Discharge    PT Home Exercise Plan heel raises, toe yoga, great toe ext stretch. clam, hip add, prone hip ext, towel scrunches, AROM inversion, bridge with clam; bridge with ball, thomas test stretch, SLS; v-up ball switch, isometric inversion ball squeeze   Consulted and Agree with Plan of Care Patient      Patient will benefit from skilled therapeutic intervention in  order  to improve the following deficits and impairments:  Abnormal gait, Decreased range of motion, Difficulty walking, Increased muscle spasms, Decreased activity tolerance, Pain, Improper body mechanics, Impaired flexibility, Decreased balance, Decreased strength, Impaired sensation, Postural dysfunction  Visit Diagnosis: Pain in right ankle and joints of right foot  Difficulty in walking, not elsewhere classified  Muscle weakness (generalized)     Problem List Patient Active Problem List   Diagnosis Date Noted  . Genetic testing 04/24/2017  . Family history of colon cancer   . Family history of skin cancer   . Family history of breast cancer   . Posterior tibial tendinitis, right leg 01/29/2017  . Medial epicondylitis of right elbow 12/11/2016  . Hot flashes 11/12/2016  . Obesity 11/12/2016  . Encounter for Medicare annual wellness exam 11/12/2016  . COPD mixed type ([place]) 06/05/2016  . Costochondritis 03/23/2015  . Hx of adenomatous colonic polyps 01/23/2015  . Medication management 11/21/2014  . Depression   . Hyperlipidemia   . Anemia   . Pre-diabetes   . Vitamin D deficiency   . Tobacco dependence   . Seasonal and perennial allergic rhinitis 07/03/2013    Dorene Ar, PTA 05/12/2017, 11:40 AM  [place], [place], [address] Phone: [phone]   Fax:  [phone]  Name: Amisha Pospisil MRN: [id number] Date of Birth: May 02, 1951

## 2017-05-14 ENCOUNTER — Encounter: Payer: Self-pay | Admitting: Physician Assistant

## 2017-05-14 ENCOUNTER — Encounter: Payer: Self-pay | Admitting: Internal Medicine

## 2017-05-15 ENCOUNTER — Ambulatory Visit: Payer: Medicare Other | Admitting: Physical Therapy

## 2017-05-15 ENCOUNTER — Ambulatory Visit (INDEPENDENT_AMBULATORY_CARE_PROVIDER_SITE_OTHER): Payer: Medicare Other

## 2017-05-15 ENCOUNTER — Encounter: Payer: Self-pay | Admitting: Physical Therapy

## 2017-05-15 DIAGNOSIS — M25571 Pain in right ankle and joints of right foot: Secondary | ICD-10-CM

## 2017-05-15 DIAGNOSIS — Z23 Encounter for immunization: Secondary | ICD-10-CM

## 2017-05-15 DIAGNOSIS — R262 Difficulty in walking, not elsewhere classified: Secondary | ICD-10-CM

## 2017-05-15 DIAGNOSIS — M6281 Muscle weakness (generalized): Secondary | ICD-10-CM

## 2017-05-15 NOTE — Progress Notes (Signed)
Pt presents for HD FLU.

## 2017-05-15 NOTE — Therapy (Signed)
[place], [place], [address] Phone: [phone]   Fax:  [phone]  Physical Therapy Treatment/Discharge Summary  Patient Details  Name: Karen Barrera MRN: [id number] Date of Birth: May 26, 1951 Referring Provider: Jean Rosenthal, MD  Encounter Date: 05/15/2017      PT End of Session - 05/15/17 0759    Visit Number 11   Number of Visits 13   Date for PT Re-Evaluation 05/15/17   Authorization Type MCR- KX at visit 15   PT Start Time 0800   PT Stop Time 0831   PT Time Calculation (min) 31 min   Activity Tolerance Patient tolerated treatment well   Behavior During Therapy [facility] for tasks assessed/performed      Past Medical History:  Diagnosis Date  . Acute perforated appendicitis 10/31/2014  . Allergic rhinitis   . Allergy   . Anemia   . Arthritis    "fingers" (10/31/2014)  . Chronic bronchitis ([place])    "got it q yr til I had my nose OR" (10/31/2014)  . Colon polyp   . DDD (degenerative disc disease), cervical   . DDD (degenerative disc disease), lumbar   . Depression    "short term when my sister passed away unexpectedly"  . Family history of adverse reaction to anesthesia    "mother is hard to wake up; a little goes a long way w/her"  . Family history of breast cancer   . Family history of colon cancer   . Family history of skin cancer   . GERD (gastroesophageal reflux disease)   . History of stomach ulcers   . Hyperlipidemia    "borderline; RX is preventative" (10/31/2014)  . Kidney stones    "they passed"  . Migraine    "used to get them alot; don't get them anymore" (10/31/2014)  . Osteoarthritis of both knees   . Osteopenia   . Pneumonia 2003 X 1  . Pre-diabetes   . Seizures ([place]) ~ 1962 X 1   "from an abscessed wisdom tooth"  . Tobacco dependence   . Vitamin D deficiency     Past Surgical History:  Procedure Laterality Date  . APPENDECTOMY    . ARTHROTOMY Right 01/11/2016    Procedure: DISTAL INTERPHALANGEAL JOINT ARTHROTOMY RIGHT INDEX FINGER;  Surgeon: Leanora Cover, MD;  Location: [place];  Service: Orthopedics;  Laterality: Right;  . COLONOSCOPY    . CYST EXCISION Right 01/11/2016   Procedure: EXCISION MUCOID CYST;  Surgeon: Leanora Cover, MD;  Location: [place];  Service: Orthopedics;  Laterality: Right;  . LAPAROSCOPIC APPENDECTOMY N/A 10/31/2014   Procedure: APPENDECTOMY LAPAROSCOPIC;  Surgeon: Alphonsa Overall, MD;  Location: [place];  Service: General;  Laterality: N/A;  . SALPINGOOPHORECTOMY Right 1999   "w/grapefruit-sized polyp"  . SHOULDER ARTHROSCOPY Right 2003   removal spurs  . SHOULDER ARTHROSCOPY W/ ROTATOR CUFF REPAIR Left 2012  . SINUS EXPLORATION  12/2013   [place]  . TENDON REPAIR Left 10/2009   torn tendon @ elbow  . VAGINAL HYSTERECTOMY  1978    There were no vitals filed for this visit.      Subjective Assessment - 05/15/17 0759    Subjective Pt was able to find some tennis shoes but they rub and may exchange them.             [facility] PT Assessment - 05/15/17 0001      Assessment   Medical Diagnosis R post tib tendonitis  Referring Provider Jean Rosenthal, MD     Observation/Other Assessments   Focus on Therapeutic Outcomes (FOTO)  31 % limited improved from 53% limited.      AROM   Right Ankle Dorsiflexion 6   Right Ankle Plantar Flexion 60   Right Ankle Inversion 30   Right Ankle Eversion 24     Strength   Right Hip Extension 5/5   Left Hip Extension 5/5   Right Ankle Eversion 5/5                     [facility] Adult PT Treatment/Exercise - 06-12-17 0001      Knee/Hip Exercises: Aerobic   Nustep 5 min L7                PT Education - Jun 12, 2017 0834    Education provided Yes   Education Details HEP, importance of continued exercises, shoes, options for DF stretching   Person(s) Educated Patient   Methods Explanation   Comprehension Verbalized  understanding             PT Long Term Goals - June 12, 2017 0806      PT LONG TERM GOAL #1   Title FOTO to 34% limitation to indicate significant improvement in functional ability   Baseline 31%    Status Achieved     PT LONG TERM GOAL #2   Title Pt will be able to walk as needed to run errands and complete household chores ankle pain <=2/10   Baseline reports being much improved, unless she is wearing a shoe that presses on the painful area   Status Achieved     PT LONG TERM GOAL #3   Title Gross LE MMT 5/5 to provide necessary support to LE biomechanical chain   Baseline see flowsheet   Status Achieved     PT LONG TERM GOAL #4   Title Pt will be independent with long term HEP for continued care/strengthening   Baseline independent   Status Achieved               Plan - 06-12-17 2355    Clinical Impression Statement Pt has made significant progress toward goals and improvements in objective measures. Reviewed HEP exercises and discussed importance of continued HEP to maintain gains. Pt asked about restrictions and was educated on being aware of pain and resting as needed to avoid excessive inflammation. Pt verbalized comfort and understanding and was encouraged to contact us with any further questions.    PT Treatment/Interventions ADLs/Self Care Home Management;Cryotherapy;Electrical Stimulation;Iontophoresis 62m/ml Dexamethasone;Functional mobility training;Stair training;Gait training;Ultrasound;Traction;Moist Heat;Therapeutic activities;Therapeutic exercise;Balance training;Neuromuscular re-education;Patient/family education;Passive range of motion;Manual techniques;Dry needling;Taping   PT Home Exercise Plan heel raises, toe yoga, great toe ext stretch. clam, hip add, prone hip ext, towel scrunches, AROM inversion, bridge with clam; bridge with ball, thomas test stretch, SLS; v-up ball switch, isometric inversion ball squeeze   Consulted and Agree with Plan of Care  Patient      Patient will benefit from skilled therapeutic intervention in order to improve the following deficits and impairments:  Abnormal gait, Decreased range of motion, Difficulty walking, Increased muscle spasms, Decreased activity tolerance, Pain, Improper body mechanics, Impaired flexibility, Decreased balance, Decreased strength, Impaired sensation, Postural dysfunction  Visit Diagnosis: Pain in right ankle and joints of right foot  Difficulty in walking, not elsewhere classified  Muscle weakness (generalized)       G-Codes - [id number]    Functional Assessment Tool Used (Outpatient Only) FOTO  31% limitation, clinical judgement   Functional Limitation Mobility: Walking and moving around   Mobility: Walking and Moving Around Goal Status [phone]) At least 20 percent but less than 40 percent impaired, limited or restricted   Mobility: Walking and Moving Around Discharge Status [phone]) At least 20 percent but less than 40 percent impaired, limited or restricted      Problem List Patient Active Problem List   Diagnosis Date Noted  . Genetic testing 04/24/2017  . Family history of colon cancer   . Family history of skin cancer   . Family history of breast cancer   . Posterior tibial tendinitis, right leg 01/29/2017  . Medial epicondylitis of right elbow 12/11/2016  . Hot flashes 11/12/2016  . Obesity 11/12/2016  . Encounter for Medicare annual wellness exam 11/12/2016  . COPD mixed type ([place]) 06/05/2016  . Costochondritis 03/23/2015  . Hx of adenomatous colonic polyps 01/23/2015  . Medication management 11/21/2014  . Depression   . Hyperlipidemia   . Anemia   . Pre-diabetes   . Vitamin D deficiency   . Tobacco dependence   . Seasonal and perennial allergic rhinitis 07/03/2013   PHYSICAL THERAPY DISCHARGE SUMMARY  Visits from Start of Care: 11  Current functional level related to goals / functional outcomes: See above   Remaining deficits: See above    Education / Equipment: Anatomy of condition, POC, HEP, exercise form/rationale  Plan: Patient agrees to discharge.  Patient goals were met. Patient is being discharged due to meeting the stated rehab goals.  ?????     Lavonna Lampron C. Kerrin Markman PT, DPT 05/15/17 8:35 AM   [facility] [address] [place], [place], [address] Phone: [phone]   Fax:  [phone]  Name: Lillyrose Reitan MRN: [id number] Date of Birth: August 26, 1950

## 2017-05-25 ENCOUNTER — Encounter: Payer: Self-pay | Admitting: Internal Medicine

## 2017-05-25 ENCOUNTER — Ambulatory Visit (INDEPENDENT_AMBULATORY_CARE_PROVIDER_SITE_OTHER): Payer: Medicare Other | Admitting: Internal Medicine

## 2017-05-25 VITALS — BP 120/80 | HR 72 | Ht 63.58 in | Wt 173.4 lb

## 2017-05-25 DIAGNOSIS — K648 Other hemorrhoids: Secondary | ICD-10-CM | POA: Diagnosis not present

## 2017-05-25 NOTE — Progress Notes (Signed)
Karen Barrera 66 y.o. 04-05-1951 [id number]  Assessment & Plan:   Encounter Diagnosis  Name Primary?  . Hemorrhoids, internal, with bleeding Yes    Transient rectal bleeding the summer from hemorrhoids.  She was coughing a lot and all but that flared them.  No problems now.  Should she have recurrent bleeding she can call back use suppositories as needed.  Splane that blood on the toilet paper alone should not warrant concern beyond hemorrhoids, if she starts passing blood mixed in with the stool or into the commode that could represent something different and warrants informing me.  She will be due for a routine colonoscopy in 2021.   Subjective:   Chief Complaint: Bleeding from the rectum  HPI The patient noticed 4 days of bright red blood per rectum on the toilet paper only in the summer in July.  She had an appointment but had to cancel so she is presenting now for follow-up.  She just wants to be sure that this does not represent something beyond bleeding hemorrhoids which she had at her colonoscopy when last done in 2016.  No rectal pain no significant change in bowel habits though she said she was coughing a bit in the summer more than usual because of allergies. Allergies  Allergen Reactions  . Chantix [Varenicline] Anaphylaxis    Makes throat swell up   Current Meds  Medication Sig  . albuterol (PROAIR HFA) 108 (90 Base) MCG/ACT inhaler Inhale 1 to 2 inhalations 10-15 minutes apart every 4 hours  if needed to rescue asthma  . aspirin 81 MG tablet Take 81 mg by mouth daily.  . B Complex-C (SUPER B COMPLEX PO) Take by mouth daily.  . Cholecalciferol (VITAMIN D-3) 5000 UNITS TABS Take by mouth daily.  . Coenzyme Q10 (COQ10) 200 MG CAPS Take 200 mg by mouth daily.  . diclofenac sodium (VOLTAREN) 1 % GEL Apply 2 g topically 4 (four) times daily.  Marland Kitchen escitalopram (LEXAPRO) 20 MG tablet Take 1 tablet (20 mg total) by mouth daily.  Marland Kitchen estradiol (ESTRACE) 1 MG tablet Take 1 mg  by mouth daily.  . fluticasone furoate-vilanterol (BREO ELLIPTA) 100-25 MCG/INH AEPB Inhale 1 puff into the lungs daily. Rinse mouth with water after each use  . Magnesium 300 MG CAPS Take by mouth daily.  . montelukast (SINGULAIR) 10 MG tablet Take 1 tablet (10 mg total) by mouth at bedtime.  . rosuvastatin (CRESTOR) 10 MG tablet Take 1 tablet (10 mg total) by mouth daily.   Past Medical History:  Diagnosis Date  . Acute perforated appendicitis 10/31/2014  . Adenomatous colon polyp   . Allergic rhinitis   . Allergy   . Anemia   . Arthritis    "fingers" (10/31/2014)  . Chronic bronchitis ([place])    "got it q yr til I had my nose OR" (10/31/2014)  . COPD (chronic obstructive pulmonary disease) ([place])   . DDD (degenerative disc disease), cervical   . DDD (degenerative disc disease), lumbar   . Depression    "short term when my sister passed away unexpectedly"  . Family history of adverse reaction to anesthesia    "mother is hard to wake up; a little goes a long way w/her"  . Family history of breast cancer   . Family history of colon cancer   . Family history of skin cancer   . GERD (gastroesophageal reflux disease)   . History of stomach ulcers   . Hyperlipidemia    "borderline; RX  is preventative" (10/31/2014)  . Kidney stones    "they passed"  . Migraine    "used to get them alot; don't get them anymore" (10/31/2014)  . Osteoarthritis of both knees   . Osteopenia   . Pneumonia 2003 X 1  . Pre-diabetes   . Seizures ([place]) ~ 1962 X 1   "from an abscessed wisdom tooth"  . Tobacco dependence   . Vitamin D deficiency    Past Surgical History:  Procedure Laterality Date  . APPENDECTOMY    . ARTHROTOMY Right 01/11/2016   Procedure: DISTAL INTERPHALANGEAL JOINT ARTHROTOMY RIGHT INDEX FINGER;  Surgeon: Leanora Cover, MD;  Location: [facility];  Service: Orthopedics;  Laterality: Right;  . COLONOSCOPY    . CYST EXCISION Right 01/11/2016   Procedure: EXCISION MUCOID CYST;   Surgeon: Leanora Cover, MD;  Location: [facility];  Service: Orthopedics;  Laterality: Right;  . LAPAROSCOPIC APPENDECTOMY N/A 10/31/2014   Procedure: APPENDECTOMY LAPAROSCOPIC;  Surgeon: Alphonsa Overall, MD;  Location: [facility];  Service: General;  Laterality: N/A;  . SALPINGOOPHORECTOMY Right 1999   "w/grapefruit-sized polyp"  . SHOULDER ARTHROSCOPY Right 2003   removal spurs  . SHOULDER ARTHROSCOPY W/ ROTATOR CUFF REPAIR Left 2012  . SINUS EXPLORATION  12/2013   [facility]  . TENDON REPAIR Left 10/2009   torn tendon @ elbow  . VAGINAL HYSTERECTOMY  1978   Review of Systems As per HPI  Objective:   Physical Exam BP 120/80 (BP Location: Left Arm, Patient Position: Sitting, Cuff Size: Normal)   Pulse 72   Ht 5' 3.58" (1.615 m) Comment: height measured without shoes  Wt 173 lb 6 oz (78.6 kg)   BMI 30.15 kg/m   Karen Barrera present  Rectal  NL anoderm No mass, nontender, brown stool

## 2017-05-25 NOTE — Patient Instructions (Addendum)
   I feel confident that you had bleeding from hemorrhoids. Use the suppositories should it recur. If it is more frequent or you are passing blood mixed in with stool let me know - we would need to consider doing a colonoscopy sooner than planned. Right now I plan to do that around July 2021.  I appreciate the opportunity to care for you. Gatha Mayer, MD, [facility]

## 2017-06-05 ENCOUNTER — Ambulatory Visit (INDEPENDENT_AMBULATORY_CARE_PROVIDER_SITE_OTHER)
Admission: RE | Admit: 2017-06-05 | Discharge: 2017-06-05 | Disposition: A | Discharge status: Home / Self Care | Payer: Medicare Other | Source: Ambulatory Visit | Attending: Internal Medicine | Admitting: Internal Medicine

## 2017-06-05 ENCOUNTER — Encounter: Payer: Self-pay | Admitting: Internal Medicine

## 2017-06-05 ENCOUNTER — Telehealth: Payer: Self-pay | Admitting: Internal Medicine

## 2017-06-05 ENCOUNTER — Ambulatory Visit (INDEPENDENT_AMBULATORY_CARE_PROVIDER_SITE_OTHER): Payer: Medicare Other | Admitting: Internal Medicine

## 2017-06-05 VITALS — BP 122/76 | HR 65 | Ht 63.5 in | Wt 172.2 lb

## 2017-06-05 DIAGNOSIS — J3089 Other allergic rhinitis: Secondary | ICD-10-CM | POA: Diagnosis not present

## 2017-06-05 DIAGNOSIS — Z720 Tobacco use: Secondary | ICD-10-CM | POA: Diagnosis not present

## 2017-06-05 DIAGNOSIS — J449 Chronic obstructive pulmonary disease, unspecified: Secondary | ICD-10-CM | POA: Diagnosis not present

## 2017-06-05 DIAGNOSIS — J302 Other seasonal allergic rhinitis: Secondary | ICD-10-CM

## 2017-06-05 DIAGNOSIS — R9389 Abnormal findings on diagnostic imaging of other specified body structures: Secondary | ICD-10-CM

## 2017-06-05 DIAGNOSIS — R918 Other nonspecific abnormal finding of lung field: Secondary | ICD-10-CM | POA: Diagnosis not present

## 2017-06-05 DIAGNOSIS — F17200 Nicotine dependence, unspecified, uncomplicated: Secondary | ICD-10-CM | POA: Diagnosis not present

## 2017-06-05 MED ORDER — UMECLIDINIUM-VILANTEROL 62.5-25 MCG/INH IN AEPB: 1.0000 | INHALATION_SPRAY | Freq: Every day | RESPIRATORY_TRACT | 0 refills | Status: DC

## 2017-06-05 NOTE — Assessment & Plan Note (Signed)
Previous PFTs normal, favoring asthmatic bronchitis physiology.  Tobacco use against advice.  Persistent cough needs attention.  We will try changing inhalers, check CXR

## 2017-06-05 NOTE — Telephone Encounter (Signed)
Karen Barrera with [facility] calling in a call report on pt's most recent CXR.  Full report available in Epic, impression copied below. ------------------  IMPRESSION: 1. Question of expansile lytic lesion of the sternum further evaluation with chest CT is recommended. 2. No evidence for acute cardiopulmonary abnormality. These results will be called to the ordering clinician or representative by the Radiologist Assistant, and communication documented in the PACS or zVision Dashboard.  ----------  CY please advise.  Thanks.

## 2017-06-05 NOTE — Telephone Encounter (Signed)
CXR- The radiologist questions a possible abnormality in the sternum ("breast bone"). We need to do a CT scan of the chest to look closer at this.  Please order CT chest, no contrast      Dx smoker,  Abnormal CXR

## 2017-06-05 NOTE — Patient Instructions (Signed)
Sample and print Anoro     Inhale 1 puff, once daily       Try this instead of Breo for comparison  We mentioned "Throat Coat Tea"   On the tea shelf at the grocery store  If you have more problems with your throat, we will want to get an ENT referral to look at it.  Order- CXR    Dx tobacco user  Please keep trying to stop smoking- We don't want it to stop you !!   Please call if we can help

## 2017-06-05 NOTE — Telephone Encounter (Signed)
Called p's husbandt and advised message from the provider. He understood and verbalized understanding. Nothing further is needed.   CT chest ordered.

## 2017-06-05 NOTE — Assessment & Plan Note (Addendum)
Emphasis on smoking cessation.  Change of cough is an obvious warning sign

## 2017-06-05 NOTE — Assessment & Plan Note (Signed)
Reviewed use of Flonase and antihistamines, sips of liquids, where needed.

## 2017-06-05 NOTE — Progress Notes (Signed)
HPI female smoker followed for allergic rhinitis, recurrent acute bronchitis, tobacco use Allergy Profile 06/16/13 Total IgE 41.7   Pos dust mite, dog, [place] grass, ragwee Office Spirometry 06/13/2015-within normal limits. FVC 3.55/117%, FEV1 2.64/111%, FEV1/FVC 0.74 PFT 10/31/2013-within normal limits with no response to bronchodilator. FVC 3.91/120%, FEV1 3.08/123%, FEV1/FEC 0.79, FEF 25-75% 2.90/129%. TLC 113%, DLCO 86%. 6 minute walk test 10/31/2013-94%, 97%, 97%, 480 m. Normal oxygenation. Office Spirometry 06/13/2015-within normal limits. FVC 3.55/117%, FEV1 2.64/111%, FEV1/FVC 0.74 ----------------------------------------------------------------------------------------------  06/05/2016-66 year old female smoker followed for allergic rhinitis, recurrent acute bronchitis, tobacco use FOLLOWS FOR:Pt denies any wheezing, cough, congestion or SOB since last visit 05-2015 Rarely uses rescue inhaler. Continues Singulair daily and continues Breo Ellipta but unclear if Memory Dance is doing anything.  Uses her nebulizer machine only during exacerbations typically at seasonal holidays like Christmas and Easter, probably when she catches colds from family. We discussed her smoking again.  06/05/17- 66 year old female smoker followed for allergic rhinitis, recurrent acute bronchitis, tobacco use ---Pt states that she has been doing good. States that she has a "dry spot" in throat that makes her cough a lot and does have SOB with strenuous activity. Denies any CP.  Has been persistent for 4 months. Continues to smoke, but blames spring and fall pollens for variable dry cough sensation from her throat.  Denies fever, blood, adenopath and swallowing. Nebulizer/albuterol and albuterol rescue inhaler used occasionally. Breo 100.  UTD f&p  ROS-see HPI + = positive Constitutional:   No-   weight loss, night sweats, fevers, chills, fatigue, lassitude. HEENT:   +headaches, difficulty swallowing, tooth/dental  problems, sore throat,       sneezing,no- itching, ear ache, +nasal congestion, +post nasal drip,  CV:  No-   chest pain, orthopnea, PND, swelling in lower extremities, anasarca,  dizziness, palpitations Resp: No-   shortness of breath with exertion or at rest.             productive cough, +-non-productive cough,  No- coughing up of blood.              No-   change in color of mucus.  No- wheezing Skin: No-   rash or lesions. GI:  No-   heartburn, indigestion, abdominal pain, nausea, vomiting,  GU:  MS:  No-   joint pain or swelling.   Neuro-     nothing unusual Psych:  No- change in mood or affect. No depression or anxiety.  No memory loss.  OBJ- Physical Exam General- Alert, Oriented, Affect-appropriate, Distress- none acute.  Skin- rash-none, lesions- none, excoriation- none Lymphadenopathy- none Head- atraumatic            Eyes- Gross vision intact, PERRLA, conjunctivae and secretions clear            Ears- Hearing, canals-normal            Nose- Clear, no-Septal dev, mucus, polyps, erosion, perforation             Throat- Mallampati II , mucosa clear , drainage- none, tonsils- atrophic Neck- flexible , trachea midline, no stridor , thyroid nl, carotid no bruit Chest - symmetrical excursion , unlabored           Heart/CV- RRR , no murmur , no gallop  , no rub, nl s1 s2                           - JVD- none , edema- none, stasis changes- none, varices-  none           Lung- + clear, wheeze -none, cough- none , dullness-none, rub- none           Chest wall-  Abd-  Br/ Gen/ Rectal- Not done, not indicated Extrem- cyanosis- none, clubbing, none, atrophy- none, strength- nl Neuro- grossly intact to observation

## 2017-06-08 ENCOUNTER — Other Ambulatory Visit: Payer: Medicare Other

## 2017-06-11 ENCOUNTER — Telehealth: Payer: Self-pay | Admitting: Internal Medicine

## 2017-06-11 ENCOUNTER — Ambulatory Visit (INDEPENDENT_AMBULATORY_CARE_PROVIDER_SITE_OTHER)
Admission: RE | Admit: 2017-06-11 | Discharge: 2017-06-11 | Disposition: A | Discharge status: Home / Self Care | Payer: Medicare Other | Source: Ambulatory Visit | Attending: Internal Medicine | Admitting: Internal Medicine

## 2017-06-11 DIAGNOSIS — Z720 Tobacco use: Secondary | ICD-10-CM

## 2017-06-11 DIAGNOSIS — R9389 Abnormal findings on diagnostic imaging of other specified body structures: Secondary | ICD-10-CM | POA: Diagnosis not present

## 2017-06-11 DIAGNOSIS — R918 Other nonspecific abnormal finding of lung field: Secondary | ICD-10-CM | POA: Diagnosis not present

## 2017-06-11 DIAGNOSIS — R911 Solitary pulmonary nodule: Secondary | ICD-10-CM

## 2017-06-11 NOTE — Telephone Encounter (Signed)
Spoke with pt, who is requesting CT results from 06/11/17.   CY please advise. Thanks.

## 2017-06-11 NOTE — Telephone Encounter (Signed)
She has received these results.

## 2017-06-11 NOTE — Telephone Encounter (Signed)
Sent through "Results"- please verify we have contacted her with these. Thanks

## 2017-06-11 NOTE — Telephone Encounter (Signed)
CT scan is scheduled for 4 months to recheck lung nodules

## 2017-06-26 ENCOUNTER — Telehealth: Payer: Self-pay | Admitting: Internal Medicine

## 2017-06-26 NOTE — Telephone Encounter (Signed)
lmtcb for pt.  

## 2017-06-30 MED ORDER — UMECLIDINIUM-VILANTEROL 62.5-25 MCG/INH IN AEPB: 1.0000 | INHALATION_SPRAY | Freq: Every day | RESPIRATORY_TRACT | 1 refills | Status: DC

## 2017-06-30 NOTE — Telephone Encounter (Signed)
Called spoke with patient who reports she is doing well on the Anoro and would like a 90 day supply sent to [facility] Pt is scheduled for 1 year follow up on 11.11.19  Refill sent Pt aware to contact the office if anything is needed prior to her next appt  Nothing further needed; will sign off.

## 2017-07-06 ENCOUNTER — Encounter: Payer: Self-pay | Admitting: Physician Assistant

## 2017-07-26 DIAGNOSIS — R509 Fever, unspecified: Secondary | ICD-10-CM | POA: Diagnosis not present

## 2017-07-26 DIAGNOSIS — J209 Acute bronchitis, unspecified: Secondary | ICD-10-CM | POA: Diagnosis not present

## 2017-07-26 DIAGNOSIS — R05 Cough: Secondary | ICD-10-CM | POA: Diagnosis not present

## 2017-07-26 DIAGNOSIS — R0981 Nasal congestion: Secondary | ICD-10-CM | POA: Diagnosis not present

## 2017-07-26 DIAGNOSIS — J111 Influenza due to unidentified influenza virus with other respiratory manifestations: Secondary | ICD-10-CM | POA: Diagnosis not present

## 2017-08-05 ENCOUNTER — Telehealth: Payer: Self-pay | Admitting: Internal Medicine

## 2017-08-05 MED ORDER — UMECLIDINIUM-VILANTEROL 62.5-25 MCG/INH IN AEPB: 1.0000 | INHALATION_SPRAY | Freq: Every day | RESPIRATORY_TRACT | 1 refills | Status: DC

## 2017-08-05 NOTE — Telephone Encounter (Signed)
Patient was calling for a refill on her Anoro to send into [facility]. A RX was sent over last month for [facility] to hold but they stated that they can not find the RX. Advised patient that I would send in a RX for her. Nothing else needed at time of call.

## 2017-08-06 ENCOUNTER — Ambulatory Visit (INDEPENDENT_AMBULATORY_CARE_PROVIDER_SITE_OTHER): Payer: Medicare Other | Admitting: Physician Assistant

## 2017-08-06 ENCOUNTER — Encounter: Payer: Self-pay | Admitting: Physician Assistant

## 2017-08-06 VITALS — BP 110/86 | HR 77 | Temp 97.5°F | Resp 16 | Ht 64.0 in | Wt 176.2 lb

## 2017-08-06 DIAGNOSIS — J449 Chronic obstructive pulmonary disease, unspecified: Secondary | ICD-10-CM | POA: Diagnosis not present

## 2017-08-06 DIAGNOSIS — E6609 Other obesity due to excess calories: Secondary | ICD-10-CM

## 2017-08-06 DIAGNOSIS — D649 Anemia, unspecified: Secondary | ICD-10-CM

## 2017-08-06 DIAGNOSIS — Z1389 Encounter for screening for other disorder: Secondary | ICD-10-CM | POA: Diagnosis not present

## 2017-08-06 DIAGNOSIS — Z86010 Personal history of colonic polyps: Secondary | ICD-10-CM

## 2017-08-06 DIAGNOSIS — J3089 Other allergic rhinitis: Secondary | ICD-10-CM

## 2017-08-06 DIAGNOSIS — E559 Vitamin D deficiency, unspecified: Secondary | ICD-10-CM

## 2017-08-06 DIAGNOSIS — Z6830 Body mass index (BMI) 30.0-30.9, adult: Secondary | ICD-10-CM

## 2017-08-06 DIAGNOSIS — J0100 Acute maxillary sinusitis, unspecified: Secondary | ICD-10-CM

## 2017-08-06 DIAGNOSIS — E782 Mixed hyperlipidemia: Secondary | ICD-10-CM | POA: Diagnosis not present

## 2017-08-06 DIAGNOSIS — I700 Atherosclerosis of aorta: Secondary | ICD-10-CM | POA: Insufficient documentation

## 2017-08-06 DIAGNOSIS — F3341 Major depressive disorder, recurrent, in partial remission: Secondary | ICD-10-CM

## 2017-08-06 DIAGNOSIS — F17200 Nicotine dependence, unspecified, uncomplicated: Secondary | ICD-10-CM

## 2017-08-06 DIAGNOSIS — Z79899 Other long term (current) drug therapy: Secondary | ICD-10-CM | POA: Diagnosis not present

## 2017-08-06 DIAGNOSIS — J302 Other seasonal allergic rhinitis: Secondary | ICD-10-CM

## 2017-08-06 MED ORDER — LEVOFLOXACIN 500 MG PO TABS: 500.0000 mg | ORAL_TABLET | Freq: Every day | ORAL | 0 refills | Status: DC

## 2017-08-06 MED ORDER — PREDNISONE 20 MG PO TABS: ORAL_TABLET | ORAL | 0 refills | Status: DC

## 2017-08-06 NOTE — Patient Instructions (Signed)
Make sure you are on an allergy pill, see below for more details. Please take the prednisone as directed below, this is NOT an antibiotic so you do NOT have to finish it. You can take it for a few days and stop it if you are doing better.   Please take the prednisone to help decrease inflammation and therefore decrease symptoms. Take it it with food to avoid GI upset. It can cause increased energy but on the other hand it can make it hard to sleep at night so please take it AT [place], it takes 8-12 hours to start working so it will NOT affect your sleeping if you take it at night with your food!!  If you are diabetic it will increase your sugars so decrease carbs and monitor your sugars closely.     HOW TO TREAT VIRAL COUGH AND COLD SYMPTOMS:  -Symptoms usually last at least 1 week with the worst symptoms being around day 4.  - colds usually start with a sore throat and end with a cough, and the cough can take 2 weeks to get better.  -No antibiotics are needed for colds, flu, sore throats, cough, bronchitis UNLESS symptoms are longer than 7 days OR if you are getting better then get drastically worse.  -There are a lot of combination medications (Dayquil, Nyquil, Vicks 44, tyelnol cold and sinus, ETC). Please look at the ingredients on the back so that you are treating the correct symptoms and not doubling up on medications/ingredients.    Medicines you can use  Nasal congestion  - pseudoephedrine (Sudafed)- behind the counter, do not use if you have high blood pressure, medicine that have -D in them.  - phenylephrine (Sudafed PE) -Dextormethorphan + chlorpheniramine (Coridcidin HBP)- okay if you have high blood pressure -Oxymetazoline (Afrin) nasal spray- LIMIT to 3 days -Saline nasal spray -Neti pot (used distilled or bottled water)  Ear pain/congestion  -pseudoephedrine (sudafed) - Nasonex/flonase nasal spray  Fever  -Acetaminophen (Tyelnol) -Ibuprofen (Advil, motrin,  aleve)  Sore Throat  -Acetaminophen (Tyelnol) -Ibuprofen (Advil, motrin, aleve) -Drink a lot of water -Gargle with salt water - Rest your voice (don't talk) -Throat sprays -Cough drops  Body Aches  -Acetaminophen (Tyelnol) -Ibuprofen (Advil, motrin, aleve)  Headache  -Acetaminophen (Tyelnol) -Ibuprofen (Advil, motrin, aleve) - Exedrin, Exedrin Migraine  Allergy symptoms (cough, sneeze, runny nose, itchy eyes) -Claritin or loratadine cheapest but likely the weakest  -Zyrtec or certizine at night because it can make you sleepy -The strongest is allegra or fexafinadine  Cheapest at [employer], [employer], [employer]  Cough  -Dextromethorphan (Delsym)- medicine that has DM in it -Guafenesin (Mucinex/Robitussin) - cough drops - drink lots of water  Chest Congestion  -Guafenesin (Mucinex/Robitussin)  Red Itchy Eyes  - Naphcon-A  Upset Stomach  - Bland diet (nothing spicy, greasy, fried, and high acid foods like tomatoes, oranges, berries) -OKAY- cereal, bread, soup, crackers, rice -Eat smaller more frequent meals -reduce caffeine, no alcohol -Loperamide (Imodium-AD) if diarrhea -Prevacid for heart burn  General health when sick  -Hydration -wash your hands frequently -keep surfaces clean -change pillow cases and sheets often -Get fresh air but do not exercise strenuously -Vitamin D, double up on it - Vitamin C -Zinc    Here is some information to help you keep your heart healthy: Move it! - Aim for 30 mins of activity every day. Take it slowly at first. Talk to [place] before starting any new exercise program.   Lose it.  -Body Mass  Index (BMI) can indicate if you need to lose weight. A healthy range is 18.5-24.9. For a BMI calculator, go to [employer]  Waist Management -Excess abdominal fat is a risk factor for heart disease, diabetes, asthma, stroke and more. Ideal waist circumference is less than 35" for women and less than 40" for men.   Eat Right -focus on  fruits, vegetables, whole grains, and meals you make yourself. Avoid foods with trans fat and high sugar/sodium content.   Snooze or Snore? - Loud snoring can be a sign of sleep apnea, a significant risk factor for high blood pressure, heart attach, stroke, and heart arrhythmias.  Kick the habit -Quit Smoking! Avoid second hand smoke. A single cigarette raises your blood pressure for 20 mins and increases the risk of heart attack and stroke for the next 24 hours.   Are Aspirin and Supplements right for you? -Add ENTERIC COATED low dose 81 mg Aspirin daily OR can do every other day if you have easy bruising to protect your heart and head. As well as to reduce risk of Colon Cancer by 20 %, Skin Cancer by 26 % , Melanoma by 46% and Pancreatic cancer by 60%  Say "No to Stress -There may be little you can do about problems that cause stress. However, techniques such as long walks, meditation, and exercise can help you manage it.   Start Now! - Make changes one at a time and set reasonable goals to increase your likelihood of success.

## 2017-08-06 NOTE — Progress Notes (Signed)
CPE AND 3 MONTH  Assessment:   BMI 30.0-30.9,adult  Overweight  - long discussion about weight loss, diet, and exercise -recommended diet heavy in fruits and veggies and low in animal meats, cheeses, and dairy products  Aortic atherosclerosis (HCC) Control blood pressure, cholesterol, glucose, increase exercise.   COPD mixed type ([place]) Advised to stop smoking, follow Dr. Annamaria Boots, continue meds.   Seasonal and perennial allergic rhinitis Continue allergy pill  Tobacco dependence Smoking cessation-  instruction/counseling given, counseled patient on the dangers of tobacco use, advised patient to stop smoking, and reviewed strategies to maximize success, patient not ready to quit at this time.  -     EKG 12-Lead  Class 1 obesity due to excess calories with serious comorbidity and body mass index (BMI) of 30.0 to 30.9 in adult - follow up 3 months for progress monitoring - increase veggies, decrease carbs - long discussion about weight loss, diet, and exercise  Medication management -     CBC with Differential/Platelet -     BASIC METABOLIC PANEL WITH GFR -     Hepatic function panel  Mixed hyperlipidemia -continue medications, check lipids, decrease fatty foods, increase activity.  -     Lipid panel  Hx of adenomatous colonic polyps  Recurrent major depressive disorder, in partial remission ([place]) - continue medications, stress management techniques discussed, increase water, good sleep hygiene discussed, increase exercise, and increase veggies.  -     TSH  Anemia, unspecified type -     Iron,Total/Total Iron Binding Cap  Vitamin D deficiency Continue supplement  Acute non-recurrent maxillary sinusitis -     levofloxacin (LEVAQUIN) 500 MG tablet; Take 1 tablet (500 mg total) by mouth daily. -     predniSONE (DELTASONE) 20 MG tablet; 1 tablet daily for 5 days.  Screening for hematuria or proteinuria -     Urinalysis, Routine w reflex microscopic -     Microalbumin /  creatinine urine ratio   Over 40 minutes of exam, counseling, chart review and critical decision making was performed Future Appointments  Date Time Provider [place]  06/07/2018  9:00 AM Baird Lyons D, MD [facility] None  08/11/2018  2:00 PM Vicie Mutters, PA-C [facility] None    Subjective:  Karen Barrera is a 67 y.o. female who presents for CPE and 3 month follow up    Husband just had flu 2 weeks ago, she has been having non cough productive with sinus congestion, pressure, teeth pain x before christmas. No fever or chills. She had negative test for the flu.  Her blood pressure has been controlled at home, today their BP is BP: 110/86 She does not workout. She denies chest pain, shortness of breath, dizziness.  She has COPD, continues to smoke, is on anoro and has albuterol PRN, not ready to quit at this time.  Following with Dr. Annamaria Boots, getting repeat CT chest March.  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   She is on cholesterol medication and denies myalgias. Her cholesterol is at goal. The cholesterol last visit was:   Lab Results  Component Value Date   CHOL 197 03/23/2017   HDL 79 03/23/2017   LDLCALC 90 11/12/2016   TRIG 164 (H) 03/23/2017   CHOLHDL 2.5 03/23/2017   Last A1C  Lab Results  Component Value Date   HGBA1C 5.4 11/12/2016   Last GFR: Lab Results  Component Value Date   [facility]  73 03/23/2017   Patient is on Vitamin D supplement.   Lab Results  Component Value Date   VD25OH 40 05/13/2016     She is on estrace pill and lexapro 20mg  for hot flashes, she is on ASA.  BMI is Body mass index is 30.24 kg/m., she is working on diet and exercise. Wt Readings from Last 3 Encounters:  08/06/17 176 lb 3.2 oz (79.9 kg)  06/05/17 172 lb 3.2 oz (78.1 kg)  05/25/17 173 lb 6 oz (78.6 kg)      Medication Review: Current Outpatient Medications on File Prior to Visit  Medication Sig  Dispense Refill  . albuterol (PROAIR HFA) 108 (90 Base) MCG/ACT inhaler Inhale 1 to 2 inhalations 10-15 minutes apart every 4 hours  if needed to rescue asthma 48 g 3  . aspirin 81 MG tablet Take 81 mg by mouth daily.    . B Complex-C (SUPER B COMPLEX PO) Take by mouth daily.    . Cholecalciferol (VITAMIN D-3) 5000 UNITS TABS Take by mouth daily.    . Coenzyme Q10 (COQ10) 200 MG CAPS Take 200 mg by mouth daily.    . diclofenac sodium (VOLTAREN) 1 % GEL Apply 2 g topically 4 (four) times daily. 100 g 3  . escitalopram (LEXAPRO) 20 MG tablet Take 1 tablet (20 mg total) by mouth daily. 90 tablet 1  . estradiol (ESTRACE) 1 MG tablet Take 1 mg by mouth daily.  3  . montelukast (SINGULAIR) 10 MG tablet Take 1 tablet (10 mg total) by mouth at bedtime. 90 tablet 1  . rosuvastatin (CRESTOR) 10 MG tablet Take 1 tablet (10 mg total) by mouth daily. 90 tablet 1  . umeclidinium-vilanterol (ANORO ELLIPTA) 62.5-25 MCG/INH AEPB Inhale 1 puff into the lungs daily. 3 each 1   No current facility-administered medications on file prior to visit.     Allergies  Allergen Reactions  . Chantix [Varenicline] Anaphylaxis    Makes throat swell up    Current Problems (verified) Patient Active Problem List   Diagnosis Date Noted  . Genetic testing 04/24/2017  . Family history of colon cancer   . Family history of skin cancer   . Family history of breast cancer   . Posterior tibial tendinitis, right leg 01/29/2017  . Medial epicondylitis of right elbow 12/11/2016  . Hot flashes 11/12/2016  . Obesity 11/12/2016  . Encounter for Medicare annual wellness exam 11/12/2016  . COPD mixed type ([place]) 06/05/2016  . Costochondritis 03/23/2015  . Hx of adenomatous colonic polyps 01/23/2015  . Medication management 11/21/2014  . Depression   . Hyperlipidemia   . Anemia   . Pre-diabetes   . Vitamin D deficiency   . Tobacco dependence   . Seasonal and perennial allergic rhinitis 07/03/2013    Screening  Tests Immunization History  Administered Date(s) Administered  . Influenza Split 05/16/2013, 05/12/2014, 05/14/2015  . Influenza, High Dose Seasonal PF 05/15/2017  . Influenza, Seasonal, Injecte, Preservative Fre 05/13/2016  . Pneumococcal Conjugate-13 11/12/2016  . Pneumococcal Polysaccharide-23 06/14/2011  . Tdap 03/27/2010  . Zoster 04/14/2012   Tetanus: 2011 Pneumovax:  2012 Prevnar 13: 2018 Flu vaccine: 2018 Zostavax: 2013  MGM: 2017  at GYN DEXA:2017  At GYN PAP 2016 sees GYN Colonoscopy: 2016, due in 3-5 years PFT 05/2016 CXR 05/2017 CT chest without contrast 06/11/2017 need repeat 3 months  Names of Other Physician/Practitioners you currently use: 1. [facility] here for primary care 2. Dr. Dorena Cookey, eye  doctor, last visit 03/2016, yearly 3. Dr. Luretha Rued, dentist, last visit 2 x a years Patient Care Team: [name], MD as PCP - General (Internal Medicine) Lavonna Monarch, MD as Consulting Physician (Dermatology) Troy Sine, MD as Consulting Physician (Cardiology) Thornell Sartorius, MD as Consulting Physician (Otolaryngology) Izora Gala, MD as Consulting Physician (Otolaryngology) Sable Feil, MD as Consulting Physician (Gastroenterology) Delila Pereyra, MD as Consulting Physician (Gynecology) Gatha Mayer, MD as Consulting Physician (Gastroenterology)  SURGICAL HISTORY She  has a past surgical history that includes Shoulder arthroscopy w/ rotator cuff repair (Left, 2012); Tendon repair (Left, 10/2009); Shoulder arthroscopy (Right, 2003); Salpingoophorectomy (Right, 1999); Sinus exploration (12/2013); Vaginal hysterectomy (1978); laparoscopic appendectomy (N/A, 10/31/2014); Appendectomy; Colonoscopy; Cyst excision (Right, 01/11/2016); and Arthrotomy (Right, 01/11/2016). FAMILY HISTORY Her family history includes Arthritis in her paternal grandmother; Breast cancer in her other; Breast cancer (age of onset: 44) in  her other; Cancer in her other; Colon cancer in her other; Colon cancer (age of onset: 32) in her other and other; Colon polyps in her father; Diabetes in her mother; Hyperlipidemia in her maternal grandfather; Melanoma (age of onset: 85) in her paternal grandmother; Seizures in her sister; Skin cancer (age of onset: 43) in her mother; Stroke in her maternal grandmother. SOCIAL HISTORY She  reports that she has been smoking cigarettes.  She has a 25.00 pack-year smoking history. she has never used smokeless tobacco. She reports that she does not drink alcohol or use drugs.  Review of Systems  Constitutional: Negative.   HENT: Positive for congestion, sinus pain and sore throat.   Eyes: Negative.   Respiratory: Positive for cough. Negative for hemoptysis, sputum production, shortness of breath and wheezing.   Cardiovascular: Negative.   Gastrointestinal: Negative for abdominal pain, blood in stool, constipation, diarrhea, heartburn, melena, nausea and vomiting.  Genitourinary: Negative.   Musculoskeletal: Negative.   Skin: Negative.   Neurological: Negative.   Endo/Heme/Allergies: Negative.   Psychiatric/Behavioral: Positive for memory loss. Negative for depression, hallucinations, substance abuse and suicidal ideas. The patient is nervous/anxious. The patient does not have insomnia.      Objective:     Today's Vitals   08/06/17 1456  BP: 110/86  Pulse: 77  Resp: 16  Temp: (!) 97.5 F (36.4 C)  SpO2: 96%  Weight: 176 lb 3.2 oz (79.9 kg)  Height: 5\' 4"  (1.626 m)   Body mass index is 30.24 kg/m.  General appearance: alert, no distress, WD/WN, female HEENT: normocephalic, sclerae anicteric, TMs pearly, nares patent, no discharge or erythema, pharynx normal Oral cavity: MMM, no lesions Neck: supple, no lymphadenopathy, no thyromegaly, no masses Heart: RRR, normal S1, S2, no murmurs Lungs: CTA bilaterally, no wheezes, rhonchi, or rales Abdomen: +bs, soft, non tender, non  distended, no masses, no hepatomegaly, no splenomegaly Musculoskeletal: nontender, no swelling, no obvious deformity Extremities: no edema, no cyanosis, no clubbing Pulses: 2+ symmetric, upper and lower extremities, normal cap refill Neurological: alert, oriented x 3, CN2-12 intact, strength normal upper extremities and lower extremities, sensation normal throughout, DTRs 2+ throughout, no cerebellar signs, gait normal Psychiatric: normal affect, behavior normal, pleasant     Vicie Mutters, PA-C   08/06/2017

## 2017-08-07 LAB — CBC WITH DIFFERENTIAL/PLATELET
Basophils Absolute: 80 cells/uL (ref 0–200)
Basophils Relative: 0.9 %
Eosinophils Absolute: 187 cells/uL (ref 15–500)
Eosinophils Relative: 2.1 %
HCT: 41.1 % (ref 35.0–45.0)
Hemoglobin: 14.1 g/dL (ref 11.7–15.5)
Lymphs Abs: 2270 cells/uL (ref 850–3900)
MCH: 30.6 pg (ref 27.0–33.0)
MCHC: 34.3 g/dL (ref 32.0–36.0)
MCV: 89.2 fL (ref 80.0–100.0)
MPV: 10.4 fL (ref 7.5–12.5)
Monocytes Relative: 8.8 %
Neutro Abs: 5580 cells/uL (ref 1500–7800)
Neutrophils Relative %: 62.7 %
Platelets: 235 10*3/uL (ref 140–400)
RBC: 4.61 10*6/uL (ref 3.80–5.10)
RDW: 12.8 % (ref 11.0–15.0)
Total Lymphocyte: 25.5 %
WBC mixed population: 783 cells/uL (ref 200–950)
WBC: 8.9 10*3/uL (ref 3.8–10.8)

## 2017-08-07 LAB — BASIC METABOLIC PANEL WITH GFR
BUN: 22 mg/dL (ref 7–25)
CO2: 27 mmol/L (ref 20–32)
Calcium: 9.1 mg/dL (ref 8.6–10.4)
Chloride: 106 mmol/L (ref 98–110)
Creat: 0.9 mg/dL (ref 0.50–0.99)
GFR, Est African American: 77 mL/min/{1.73_m2} (ref >=60)
GFR, Est Non African American: 67 mL/min/{1.73_m2} (ref >=60)
Glucose, Bld: 75 mg/dL (ref 65–99)
Potassium: 4.6 mmol/L (ref 3.5–5.3)
Sodium: 139 mmol/L (ref 135–146)

## 2017-08-07 LAB — IRON, TOTAL/TOTAL IRON BINDING CAP
%SAT: 16 % (calc) (ref 11–50)
Iron: 56 ug/dL (ref 45–160)
TIBC: 350 mcg/dL (calc) (ref 250–450)

## 2017-08-07 LAB — HEPATIC FUNCTION PANEL
AG Ratio: 1.9 (calc) (ref 1.0–2.5)
ALT: 18 U/L (ref 6–29)
AST: 18 U/L (ref 10–35)
Albumin: 4.3 g/dL (ref 3.6–5.1)
Alkaline phosphatase (APISO): 52 U/L (ref 33–130)
Bilirubin, Direct: 0.1 mg/dL (ref 0.0–0.2)
Globulin: 2.3 g/dL (calc) (ref 1.9–3.7)
Indirect Bilirubin: 0.2 mg/dL (calc) (ref 0.2–1.2)
Total Bilirubin: 0.3 mg/dL (ref 0.2–1.2)
Total Protein: 6.6 g/dL (ref 6.1–8.1)

## 2017-08-07 LAB — URINALYSIS, ROUTINE W REFLEX MICROSCOPIC
Bilirubin Urine: NEGATIVE
Glucose, UA: NEGATIVE
Hgb urine dipstick: NEGATIVE
Ketones, ur: NEGATIVE
Leukocytes, UA: NEGATIVE
Nitrite: NEGATIVE
Protein, ur: NEGATIVE
Specific Gravity, Urine: 1.01 (ref 1.001–1.03)
pH: 6 (ref 5.0–8.0)

## 2017-08-07 LAB — LIPID PANEL
Cholesterol: 210 mg/dL — ABNORMAL HIGH (ref <200)
HDL: 67 mg/dL (ref >50)
LDL Cholesterol (Calc): 105 mg/dL (calc) — ABNORMAL HIGH
Non-HDL Cholesterol (Calc): 143 mg/dL (calc) — ABNORMAL HIGH (ref <130)
Total CHOL/HDL Ratio: 3.1 (calc) (ref <5.0)
Triglycerides: 267 mg/dL — ABNORMAL HIGH (ref <150)

## 2017-08-07 LAB — MICROALBUMIN / CREATININE URINE RATIO
Creatinine, Urine: 34 mg/dL (ref 20–275)
Microalb Creat Ratio: 6 mcg/mg creat (ref <30)
Microalb, Ur: 0.2 mg/dL

## 2017-08-07 LAB — TSH: TSH: 1.85 mIU/L (ref 0.40–4.50)

## 2017-08-26 ENCOUNTER — Other Ambulatory Visit: Payer: Self-pay | Admitting: *Deleted

## 2017-09-05 DIAGNOSIS — J988 Other specified respiratory disorders: Secondary | ICD-10-CM | POA: Diagnosis not present

## 2017-09-05 DIAGNOSIS — R05 Cough: Secondary | ICD-10-CM | POA: Diagnosis not present

## 2017-09-05 DIAGNOSIS — R0989 Other specified symptoms and signs involving the circulatory and respiratory systems: Secondary | ICD-10-CM | POA: Diagnosis not present

## 2017-10-15 ENCOUNTER — Other Ambulatory Visit: Payer: Self-pay | Admitting: Internal Medicine

## 2017-10-15 DIAGNOSIS — L905 Scar conditions and fibrosis of skin: Secondary | ICD-10-CM | POA: Diagnosis not present

## 2017-10-15 DIAGNOSIS — D2262 Melanocytic nevi of left upper limb, including shoulder: Secondary | ICD-10-CM | POA: Diagnosis not present

## 2017-10-15 DIAGNOSIS — D225 Melanocytic nevi of trunk: Secondary | ICD-10-CM | POA: Diagnosis not present

## 2017-10-15 DIAGNOSIS — D1801 Hemangioma of skin and subcutaneous tissue: Secondary | ICD-10-CM | POA: Diagnosis not present

## 2017-10-15 DIAGNOSIS — L821 Other seborrheic keratosis: Secondary | ICD-10-CM | POA: Diagnosis not present

## 2017-10-15 DIAGNOSIS — L812 Freckles: Secondary | ICD-10-CM | POA: Diagnosis not present

## 2017-10-15 DIAGNOSIS — D2261 Melanocytic nevi of right upper limb, including shoulder: Secondary | ICD-10-CM | POA: Diagnosis not present

## 2017-11-01 ENCOUNTER — Other Ambulatory Visit: Payer: Self-pay | Admitting: Physician Assistant

## 2017-11-01 DIAGNOSIS — F3341 Major depressive disorder, recurrent, in partial remission: Secondary | ICD-10-CM

## 2017-11-01 DIAGNOSIS — R232 Flushing: Secondary | ICD-10-CM

## 2017-11-06 NOTE — Progress Notes (Signed)
MEDICARE ANNUAL WELLNESS VISIT AND 3 MONTH   Assessment:   Encounter for Medicare annual wellness exam 1 year Due colon 2021 Get [facility] 02/2018  Hot flashes Continue medications, continue bASA, follow up GYN -     TSH  Aortic atherosclerosis ([place]) Control blood pressure, cholesterol, glucose, increase exercise.   COPD mixed type ([place]) Advised to stop smoking, CT chest due, continue meds.   Recurrent major depressive disorder, in partial remission ([place]) - continue medications, stress management techniques discussed, increase water, good sleep hygiene discussed, increase exercise, and increase veggies.  -     TSH  Tobacco dependence Smoking cessation-  instruction/counseling given, counseled patient on the dangers of tobacco use, advised patient to stop smoking, and reviewed strategies to maximize success, patient not ready to quit at this time.   Medication management -     CBC with Differential/Platelet -     BASIC METABOLIC PANEL WITH GFR -     Hepatic function panel -     Magnesium  Mixed hyperlipidemia -continue medications, check lipids, decrease fatty foods, increase activity.  -     Lipid panel  Anemia, unspecified type -     CBC with Differential/Platelet  Vitamin D deficiency Continue supplement  Seasonal and perennial allergic rhinitis Get on allergy pill  Hx of adenomatous colonic polyps Follow up 2021  BMI 30  Overweight  - long discussion about weight loss, diet, and exercise -recommended diet heavy in fruits and veggies and low in animal meats, cheeses, and dairy products  Abnormal findings on diagnostic imaging of lung -     CT Chest Wo Contrast; Future   Over 40 minutes of exam, counseling, chart review and critical decision making was performed Future Appointments  Date Time Provider [place]  03/17/2018 11:00 AM [name], MD [facility] None  06/07/2018  9:00 AM Deneise Lever, MD [facility] None  08/11/2018  2:00 PM  Vicie Mutters, PA-C [facility] None     Plan:   During the course of the visit the patient was educated and counseled about appropriate screening and preventive services including:    Pneumococcal vaccine   Prevnar 13  Influenza vaccine  Td vaccine  Screening electrocardiogram  Bone densitometry screening  Colorectal cancer screening  Diabetes screening  Glaucoma screening  Nutrition counseling   Advanced directives: requested   Subjective:  Karen Barrera is a 67 y.o. female who presents for Medicare Annual Wellness Visit and 3 month follow up    Her blood pressure has been controlled at home, today their BP is BP: 116/70 She does workout. She denies chest pain, shortness of breath, dizziness.  She has COPD, continues to smoke, is on anoro and has albuterol PRN, not ready to quit at this time.  She has incontinence with sneezing/coughin would like medications.  She is on cholesterol medication and denies myalgias. Her cholesterol is at goal. The cholesterol last visit was:   Lab Results  Component Value Date   CHOL 210 (H) 08/06/2017   HDL 67 08/06/2017   LDLCALC 105 (H) 08/06/2017   TRIG 267 (H) 08/06/2017   CHOLHDL 3.1 08/06/2017   Last A1C  Lab Results  Component Value Date   HGBA1C 5.4 11/12/2016   Last GFR: Lab Results  Component Value Date   GFRNONAA 67 08/06/2017   Patient is on Vitamin D supplement.   Lab Results  Component Value Date   VD25OH 40 05/13/2016     She was on estrogen pills from  GYN and lexapro for hot flashes/depression which is helping some, she is on low dose ASA.   She is taking care of her mom, dad, aunt, cousin, legal gaurdian for 24 year old in group home, friend in [place] just lost her mom, helping her.   BMI is Body mass index is 30 kg/m., she is working on diet and exercise. Wt Readings from Last 8 Encounters:  11/10/17 174 lb 12.8 oz (79.3 kg)  08/06/17 176 lb 3.2 oz (79.9 kg)  06/05/17 172 lb 3.2 oz (78.1  kg)  05/25/17 173 lb 6 oz (78.6 kg)  03/23/17 175 lb (79.4 kg)  12/25/16 172 lb 3.2 oz (78.1 kg)  11/12/16 177 lb (80.3 kg)  06/05/16 162 lb 12.8 oz (73.8 kg)      Medication Review: Current Outpatient Medications on File Prior to Visit  Medication Sig Dispense Refill  . albuterol (PROAIR HFA) 108 (90 Base) MCG/ACT inhaler Inhale 1 to 2 inhalations 10-15 minutes apart every 4 hours  if needed to rescue asthma 48 g 3  . aspirin 81 MG tablet Take 81 mg by mouth daily.    . B Complex-C (SUPER B COMPLEX PO) Take by mouth daily.    . Cholecalciferol (VITAMIN D-3) 5000 UNITS TABS Take by mouth daily.    . Coenzyme Q10 (COQ10) 200 MG CAPS Take 200 mg by mouth daily.    . diclofenac sodium (VOLTAREN) 1 % GEL Apply 2 g topically 4 (four) times daily. 100 g 3  . escitalopram (LEXAPRO) 20 MG tablet TAKE 1 TABLET BY MOUTH EVERY DAY 90 tablet 1  . estradiol (ESTRACE) 1 MG tablet Take 1 mg by mouth daily.  3  . montelukast (SINGULAIR) 10 MG tablet Take 1 tablet (10 mg total) by mouth at bedtime. 90 tablet 1  . rosuvastatin (CRESTOR) 10 MG tablet Take 1 tablet (10 mg total) by mouth daily. 90 tablet 1  . umeclidinium-vilanterol (ANORO ELLIPTA) 62.5-25 MCG/INH AEPB Inhale 1 puff into the lungs daily. 3 each 1   No current facility-administered medications on file prior to visit.     Allergies  Allergen Reactions  . Chantix [Varenicline] Anaphylaxis    Makes throat swell up    Current Problems (verified) Patient Active Problem List   Diagnosis Date Noted  . Aortic atherosclerosis ([place]) 08/06/2017  . Genetic testing 04/24/2017  . Posterior tibial tendinitis, right leg 01/29/2017  . Medial epicondylitis of right elbow 12/11/2016  . Hot flashes 11/12/2016  . Obesity 11/12/2016  . Encounter for Medicare annual wellness exam 11/12/2016  . COPD mixed type ([place]) 06/05/2016  . Costochondritis 03/23/2015  . Hx of adenomatous colonic polyps 01/23/2015  . Medication management 11/21/2014  .  Depression   . Hyperlipidemia   . Anemia   . Vitamin D deficiency   . Tobacco dependence   . Seasonal and perennial allergic rhinitis 07/03/2013    Screening Tests Immunization History  Administered Date(s) Administered  . Influenza Split 05/16/2013, 05/12/2014, 05/14/2015  . Influenza, High Dose Seasonal PF 05/15/2017  . Influenza, Seasonal, Injecte, Preservative Fre 05/13/2016  . Pneumococcal Conjugate-13 11/12/2016  . Pneumococcal Polysaccharide-23 06/14/2011  . Tdap 03/27/2010  . Zoster 04/14/2012   Tetanus: 2011 Pneumovax:  2012 Prevnar 13: 2018 Flu vaccine: 2018 Zostavax: 2013  MGM: 03/10/2017   at GYN DEXA: 2017  At GYN Osteopenia PAP 2016 never abnormal, last one Colonoscopy: 2016, due in 5 years/2021 Dr. Carlean Purl PFT 05/2016 CT chest 05/2017  Names of Other Physician/Practitioners you currently use:  1. [facility] here for primary care 2. Dr. Dorena Cookey, eye doctor, last visit 2018 3. Dr. Luretha Rued, dentist, last visit Feb 2019 Patient Care Team: [name], MD as PCP - General (Internal Medicine) Lavonna Monarch, MD as Consulting Physician (Dermatology) Troy Sine, MD as Consulting Physician (Cardiology) Thornell Sartorius, MD as Consulting Physician (Otolaryngology) Izora Gala, MD as Consulting Physician (Otolaryngology) Sable Feil, MD as Consulting Physician (Gastroenterology) Delila Pereyra, MD as Consulting Physician (Gynecology) Gatha Mayer, MD as Consulting Physician (Gastroenterology)  SURGICAL HISTORY She  has a past surgical history that includes Shoulder arthroscopy w/ rotator cuff repair (Left, 2012); Tendon repair (Left, 10/2009); Shoulder arthroscopy (Right, 2003); Salpingoophorectomy (Right, 1999); Sinus exploration (12/2013); Vaginal hysterectomy (1978); laparoscopic appendectomy (N/A, 10/31/2014); Appendectomy; Colonoscopy; Cyst excision (Right, 01/11/2016); and Arthrotomy (Right,  01/11/2016). FAMILY HISTORY Her family history includes Arthritis in her paternal grandmother; Breast cancer in her other; Breast cancer (age of onset: 71) in her other; Cancer in her other; Colon cancer in her other; Colon cancer (age of onset: 70) in her other and other; Colon polyps in her father; Diabetes in her mother; Hyperlipidemia in her maternal grandfather; Melanoma (age of onset: 53) in her paternal grandmother; Seizures in her sister; Skin cancer (age of onset: 29) in her mother; Stroke in her maternal grandmother. SOCIAL HISTORY She  reports that she has been smoking cigarettes.  She has a 25.00 pack-year smoking history. She has never used smokeless tobacco. She reports that she does not drink alcohol or use drugs.   MEDICARE WELLNESS OBJECTIVES: Physical activity: Current Exercise Habits: Home exercise routine, Type of exercise: walking(3 miles), Frequency (Times/Week): 7, Intensity: Mild Cardiac risk factors: Cardiac Risk Factors include: advanced age (>61men, >79 women);dyslipidemia;hypertension;obesity (BMI >30kg/m2);smoking/ tobacco exposure Depression/mood screen:   Depression screen [facility] 2/9 11/10/2017  Decreased Interest 0  Down, Depressed, Hopeless 0  PHQ - 2 Score 0    ADLs:  In your present state of health, do you have any difficulty performing the following activities: 11/10/2017 11/12/2016  Hearing? Y Y  Comment left ear left ear  Vision? N N  Difficulty concentrating or making decisions? Tempie Donning  Walking or climbing stairs? N N  Dressing or bathing? N N  Doing errands, shopping? N N  Some recent data might be hidden     Cognitive Testing  Alert? Yes  Normal Appearance?Yes  Oriented to person? Yes  Place? Yes   Time? Yes  Recall of three objects?  2/3  Can perform simple calculations? Yes  Displays appropriate judgment?Yes  Can read the correct time from a watch face?Yes  EOL planning: Does Patient Have a Medical Advance Directive?: Yes Type of Advance  Directive: Healthcare Power of Attorney, Living will Copy of [place] in Chart?: No - copy requested  Review of Systems  Constitutional: Negative.   HENT: Negative.   Eyes: Negative.   Respiratory: Negative.   Cardiovascular: Negative.   Gastrointestinal: Negative for abdominal pain, blood in stool, constipation, diarrhea, heartburn, melena, nausea and vomiting.  Genitourinary: Negative.   Musculoskeletal: Negative.   Skin: Negative.   Neurological: Negative.   Endo/Heme/Allergies: Negative.   Psychiatric/Behavioral: Positive for memory loss. Negative for depression, hallucinations, substance abuse and suicidal ideas. The patient is nervous/anxious. The patient does not have insomnia.      Objective:     Today's Vitals   11/10/17 1029  BP: 116/70  Pulse: 68  Resp: 14  Temp: 97.8 F (36.6  C)  SpO2: 97%  Weight: 174 lb 12.8 oz (79.3 kg)  Height: 5\' 4"  (1.626 m)  PainSc: 1   PainLoc: Back   Body mass index is 30 kg/m.  General appearance: alert, no distress, WD/WN, female HEENT: normocephalic, sclerae anicteric, TMs pearly, nares patent, no discharge or erythema, pharynx normal Oral cavity: MMM, no lesions Neck: supple, no lymphadenopathy, no thyromegaly, no masses Heart: RRR, normal S1, S2, no murmurs Lungs: CTA bilaterally, no wheezes, rhonchi, or rales Abdomen: +bs, soft, non tender, non distended, no masses, no hepatomegaly, no splenomegaly Musculoskeletal: nontender, no swelling, no obvious deformity Extremities: no edema, no cyanosis, no clubbing Pulses: 2+ symmetric, upper and lower extremities, normal cap refill Neurological: alert, oriented x 3, CN2-12 intact, strength normal upper extremities and lower extremities, sensation normal throughout, DTRs 2+ throughout, no cerebellar signs, gait normal Psychiatric: normal affect, behavior normal, pleasant   Medicare Attestation I have personally reviewed: The patient's medical and social  history Their use of alcohol, tobacco or illicit drugs Their current medications and supplements The patient's functional ability including ADLs,fall risks, home safety risks, cognitive, and hearing and visual impairment Diet and physical activities Evidence for depression or mood disorders  The patient's weight, height, BMI, and visual acuity have been recorded in the chart.  I have made referrals, counseling, and provided education to the patient based on review of the above and I have provided the patient with a written personalized care plan for preventive services.     Vicie Mutters, PA-C   11/10/2017

## 2017-11-10 ENCOUNTER — Encounter: Payer: Self-pay | Admitting: Physician Assistant

## 2017-11-10 ENCOUNTER — Ambulatory Visit (INDEPENDENT_AMBULATORY_CARE_PROVIDER_SITE_OTHER): Payer: Medicare Other | Admitting: Physician Assistant

## 2017-11-10 VITALS — BP 116/70 | HR 68 | Temp 97.8°F | Resp 14 | Ht 64.0 in | Wt 174.8 lb

## 2017-11-10 DIAGNOSIS — R918 Other nonspecific abnormal finding of lung field: Secondary | ICD-10-CM | POA: Diagnosis not present

## 2017-11-10 DIAGNOSIS — E782 Mixed hyperlipidemia: Secondary | ICD-10-CM | POA: Diagnosis not present

## 2017-11-10 DIAGNOSIS — Z86010 Personal history of colonic polyps: Secondary | ICD-10-CM

## 2017-11-10 DIAGNOSIS — E559 Vitamin D deficiency, unspecified: Secondary | ICD-10-CM | POA: Diagnosis not present

## 2017-11-10 DIAGNOSIS — D649 Anemia, unspecified: Secondary | ICD-10-CM | POA: Diagnosis not present

## 2017-11-10 DIAGNOSIS — J3089 Other allergic rhinitis: Secondary | ICD-10-CM

## 2017-11-10 DIAGNOSIS — R6889 Other general symptoms and signs: Secondary | ICD-10-CM | POA: Diagnosis not present

## 2017-11-10 DIAGNOSIS — Z0001 Encounter for general adult medical examination with abnormal findings: Secondary | ICD-10-CM | POA: Diagnosis not present

## 2017-11-10 DIAGNOSIS — Z6829 Body mass index (BMI) 29.0-29.9, adult: Secondary | ICD-10-CM | POA: Diagnosis not present

## 2017-11-10 DIAGNOSIS — F3341 Major depressive disorder, recurrent, in partial remission: Secondary | ICD-10-CM | POA: Diagnosis not present

## 2017-11-10 DIAGNOSIS — J449 Chronic obstructive pulmonary disease, unspecified: Secondary | ICD-10-CM

## 2017-11-10 DIAGNOSIS — R232 Flushing: Secondary | ICD-10-CM | POA: Diagnosis not present

## 2017-11-10 DIAGNOSIS — I700 Atherosclerosis of aorta: Secondary | ICD-10-CM | POA: Diagnosis not present

## 2017-11-10 DIAGNOSIS — Z79899 Other long term (current) drug therapy: Secondary | ICD-10-CM | POA: Diagnosis not present

## 2017-11-10 DIAGNOSIS — Z0000 Encounter for general adult medical examination without abnormal findings: Secondary | ICD-10-CM

## 2017-11-10 DIAGNOSIS — F17200 Nicotine dependence, unspecified, uncomplicated: Secondary | ICD-10-CM | POA: Diagnosis not present

## 2017-11-10 DIAGNOSIS — J302 Other seasonal allergic rhinitis: Secondary | ICD-10-CM

## 2017-11-10 LAB — BASIC METABOLIC PANEL WITH GFR
BUN: 14 mg/dL (ref 7–25)
CO2: 26 mmol/L (ref 20–32)
Calcium: 9.5 mg/dL (ref 8.6–10.4)
Chloride: 109 mmol/L (ref 98–110)
Creat: 0.81 mg/dL (ref 0.50–0.99)
GFR, Est African American: 88 mL/min/{1.73_m2} (ref >=60)
GFR, Est Non African American: 76 mL/min/{1.73_m2} (ref >=60)
Glucose, Bld: 102 mg/dL — ABNORMAL HIGH (ref 65–99)
Potassium: 5 mmol/L (ref 3.5–5.3)
Sodium: 142 mmol/L (ref 135–146)

## 2017-11-10 LAB — CBC WITH DIFFERENTIAL/PLATELET
Basophils Absolute: 83 cells/uL (ref 0–200)
Basophils Relative: 1.1 %
Eosinophils Absolute: 173 cells/uL (ref 15–500)
Eosinophils Relative: 2.3 %
HCT: 40.3 % (ref 35.0–45.0)
Hemoglobin: 13.9 g/dL (ref 11.7–15.5)
Lymphs Abs: 1793 cells/uL (ref 850–3900)
MCH: 31 pg (ref 27.0–33.0)
MCHC: 34.5 g/dL (ref 32.0–36.0)
MCV: 90 fL (ref 80.0–100.0)
MPV: 10.5 fL (ref 7.5–12.5)
Monocytes Relative: 6.9 %
Neutro Abs: 4935 cells/uL (ref 1500–7800)
Neutrophils Relative %: 65.8 %
Platelets: 213 10*3/uL (ref 140–400)
RBC: 4.48 10*6/uL (ref 3.80–5.10)
RDW: 13 % (ref 11.0–15.0)
Total Lymphocyte: 23.9 %
WBC mixed population: 518 cells/uL (ref 200–950)
WBC: 7.5 10*3/uL (ref 3.8–10.8)

## 2017-11-10 LAB — HEPATIC FUNCTION PANEL
AG Ratio: 2.1 (calc) (ref 1.0–2.5)
ALT: 18 U/L (ref 6–29)
AST: 18 U/L (ref 10–35)
Albumin: 4.4 g/dL (ref 3.6–5.1)
Alkaline phosphatase (APISO): 53 U/L (ref 33–130)
Bilirubin, Direct: 0.1 mg/dL (ref 0.0–0.2)
Globulin: 2.1 g/dL (calc) (ref 1.9–3.7)
Indirect Bilirubin: 0.3 mg/dL (calc) (ref 0.2–1.2)
Total Bilirubin: 0.4 mg/dL (ref 0.2–1.2)
Total Protein: 6.5 g/dL (ref 6.1–8.1)

## 2017-11-10 LAB — LIPID PANEL
Cholesterol: 174 mg/dL (ref <200)
HDL: 76 mg/dL (ref >50)
LDL Cholesterol (Calc): 79 mg/dL (calc)
Non-HDL Cholesterol (Calc): 98 mg/dL (calc) (ref <130)
Total CHOL/HDL Ratio: 2.3 (calc) (ref <5.0)
Triglycerides: 103 mg/dL (ref <150)

## 2017-11-10 LAB — TSH: TSH: 1 mIU/L (ref 0.40–4.50)

## 2017-11-10 NOTE — Patient Instructions (Addendum)
Do zyrtec in the AM and benadrl at night Continue anoro daily and use albuterol AS needed for shortness of breath/wheezing.   Being dehydrated can hurt your kidneys, cause fatigue, headaches, muscle aches, joint pain, and dry skin/nails so please increase your fluids.   Drink 80-100 oz a day of water, measure it out!  Intermittent fasting is more about strategy than starvation. It's meant to reset your body in different ways, hopefully with fitness and nutrition changes as a result.  Like any big switchover, though, results may vary when it comes down to the individual level. What works for your friends may not work for you, or vice versa. That's why it's helpful to play around with variations on intermittent fasting and healthy habits and find what works best for you.  WHAT IS INTERMITTENT FASTING AND WHY DO IT?  Intermittent fasting doesn't involve specific foods, but rather, a strict schedule regarding when you eat. Also called "time-restricted eating," the tactic has been praised for its contribution to weight loss, improved body composition, and decreased cravings. Preliminary research also suggests it may be beneficial for glucose tolerance, hormone regulation, better muscle mass and lower body fat.  Part of its appeal is the simplicity of the effort. Unlike some other trends, there's no calculations to intermittent fasting.  You simply eat within a certain block of time, usually a window of 8-10 hours. In the other big block of time - about 14-16 hours, including when you're asleep - you don't eat anything, not even snacks. You can drink water, coffee, tea or any other beverage that doesn't have calories.  For example, if you like having a late dinner, you might skip breakfast and have your first meal at noon and your last meal of the day at 8 p.m., and then not eat until noon again the next day.  IDEAS FOR GETTING STARTED  If you're new to the strategy, it may be helpful to eat within  the typical circadian rhythm and keep eating within daylight hours. This can be especially beneficial if you're looking at intermittent fasting for weight-loss goals.  So first try only eating between 12pm to 8pm.  Outside of this time you may have water, black coffee, and hot tea. You may not eat it drink anything that has carbs, sugars, OR artificial sugars like diet soda.   Like any major eating and fitness shift, it can take time to find the perfect fit, so don't be afraid to experiment with different options - including ditching intermittent fasting altogether if it's simply not for you. But if it is, you may be surprised by some of the benefits that come along with the strategy.  Veggies are great because you can eat a ton! They are low in calories, great to fill you up, and have a ton of vitamins, minerals, and protein.       Can take zantac 150-300 mg OR pepcid 20 or 40mg  at night for 2 weeks, then you can stop or continue as needed.  Avoid alcohol, spicy foods, NSAIDS (aleve, ibuprofen) at this time. See foods below.   Food Choices for Gastroesophageal Reflux Disease When you have gastroesophageal reflux disease (GERD), the foods you eat and your eating habits are very important. Choosing the right foods can help ease the discomfort of GERD. WHAT GENERAL GUIDELINES DO I NEED TO FOLLOW?  Choose fruits, vegetables, whole grains, low-fat dairy products, and low-fat meat, fish, and poultry.  Limit fats such as oils, salad dressings, butter,  nuts, and avocado.  Keep a food diary to identify foods that cause symptoms.  Avoid foods that cause reflux. These may be different for different people.  Eat frequent small meals instead of three large meals each day.  Eat your meals slowly, in a relaxed setting.  Limit fried foods.  Cook foods using methods other than frying.  Avoid drinking alcohol.  Avoid drinking large amounts of liquids with your meals.  Avoid bending over or  lying down until 2-3 hours after eating. WHAT FOODS ARE NOT RECOMMENDED? The following are some foods and drinks that may worsen your symptoms: Vegetables Tomatoes. Tomato juice. Tomato and spaghetti sauce. Chili peppers. Onion and garlic. Horseradish. Fruits Oranges, grapefruit, and lemon (fruit and juice). Meats High-fat meats, fish, and poultry. This includes hot dogs, ribs, ham, sausage, salami, and bacon. Dairy Whole milk and chocolate milk. Sour cream. Cream. Butter. Ice cream. Cream cheese.  Beverages Coffee and tea, with or without caffeine. Carbonated beverages or energy drinks. Condiments Hot sauce. Barbecue sauce.  Sweets/Desserts Chocolate and cocoa. Donuts. Peppermint and spearmint. Fats and Oils High-fat foods, including [place] fries and potato chips. Other Vinegar. Strong spices, such as black pepper, white pepper, red pepper, cayenne, curry powder, cloves, ginger, and chili powder.

## 2017-11-17 ENCOUNTER — Ambulatory Visit
Admission: RE | Admit: 2017-11-17 | Discharge: 2017-11-17 | Disposition: A | Discharge status: Home / Self Care | Payer: Medicare Other | Source: Ambulatory Visit | Attending: Physician Assistant | Admitting: Physician Assistant

## 2017-11-17 DIAGNOSIS — R918 Other nonspecific abnormal finding of lung field: Secondary | ICD-10-CM

## 2017-11-17 DIAGNOSIS — R911 Solitary pulmonary nodule: Secondary | ICD-10-CM | POA: Diagnosis not present

## 2017-11-18 ENCOUNTER — Other Ambulatory Visit: Payer: Self-pay | Admitting: Physician Assistant

## 2017-11-18 ENCOUNTER — Encounter: Payer: Self-pay | Admitting: Physician Assistant

## 2017-11-18 DIAGNOSIS — R918 Other nonspecific abnormal finding of lung field: Secondary | ICD-10-CM

## 2018-02-03 ENCOUNTER — Other Ambulatory Visit: Payer: Self-pay | Admitting: Internal Medicine

## 2018-02-03 MED ORDER — UMECLIDINIUM-VILANTEROL 62.5-25 MCG/INH IN AEPB: 1.0000 | INHALATION_SPRAY | Freq: Every day | RESPIRATORY_TRACT | 3 refills | Status: DC

## 2018-02-03 NOTE — Telephone Encounter (Signed)
Per [facility]-okay to refill as requested. Rx sent through [facility].

## 2018-03-01 ENCOUNTER — Ambulatory Visit
Admission: RE | Admit: 2018-03-01 | Discharge: 2018-03-01 | Disposition: A | Discharge status: Home / Self Care | Payer: Medicare Other | Source: Ambulatory Visit | Attending: Physician Assistant | Admitting: Physician Assistant

## 2018-03-01 ENCOUNTER — Other Ambulatory Visit: Payer: Medicare Other

## 2018-03-01 DIAGNOSIS — R911 Solitary pulmonary nodule: Secondary | ICD-10-CM | POA: Diagnosis not present

## 2018-03-01 DIAGNOSIS — R918 Other nonspecific abnormal finding of lung field: Secondary | ICD-10-CM

## 2018-03-08 DIAGNOSIS — Z124 Encounter for screening for malignant neoplasm of cervix: Secondary | ICD-10-CM | POA: Diagnosis not present

## 2018-03-08 DIAGNOSIS — N958 Other specified menopausal and perimenopausal disorders: Secondary | ICD-10-CM | POA: Diagnosis not present

## 2018-03-08 DIAGNOSIS — Z1231 Encounter for screening mammogram for malignant neoplasm of breast: Secondary | ICD-10-CM | POA: Diagnosis not present

## 2018-03-08 DIAGNOSIS — Z6829 Body mass index (BMI) 29.0-29.9, adult: Secondary | ICD-10-CM | POA: Diagnosis not present

## 2018-03-08 DIAGNOSIS — M8588 Other specified disorders of bone density and structure, other site: Secondary | ICD-10-CM | POA: Diagnosis not present

## 2018-03-08 LAB — HM DEXA SCAN

## 2018-03-09 LAB — HM MAMMOGRAPHY

## 2018-03-17 ENCOUNTER — Other Ambulatory Visit: Payer: Self-pay | Admitting: Internal Medicine

## 2018-03-17 ENCOUNTER — Ambulatory Visit (INDEPENDENT_AMBULATORY_CARE_PROVIDER_SITE_OTHER): Payer: Medicare Other | Admitting: Internal Medicine

## 2018-03-17 ENCOUNTER — Encounter: Payer: Self-pay | Admitting: Internal Medicine

## 2018-03-17 VITALS — BP 122/70 | HR 68 | Temp 97.5°F | Resp 16 | Ht 64.0 in | Wt 170.2 lb

## 2018-03-17 DIAGNOSIS — R7303 Prediabetes: Secondary | ICD-10-CM | POA: Diagnosis not present

## 2018-03-17 DIAGNOSIS — R030 Elevated blood-pressure reading, without diagnosis of hypertension: Secondary | ICD-10-CM

## 2018-03-17 DIAGNOSIS — J449 Chronic obstructive pulmonary disease, unspecified: Secondary | ICD-10-CM | POA: Diagnosis not present

## 2018-03-17 DIAGNOSIS — R7309 Other abnormal glucose: Secondary | ICD-10-CM

## 2018-03-17 DIAGNOSIS — F3342 Major depressive disorder, recurrent, in full remission: Secondary | ICD-10-CM

## 2018-03-17 DIAGNOSIS — E559 Vitamin D deficiency, unspecified: Secondary | ICD-10-CM | POA: Diagnosis not present

## 2018-03-17 DIAGNOSIS — E782 Mixed hyperlipidemia: Secondary | ICD-10-CM | POA: Diagnosis not present

## 2018-03-17 MED ORDER — SERTRALINE HCL 100 MG PO TABS: ORAL_TABLET | ORAL | 3 refills | Status: DC

## 2018-03-17 NOTE — Patient Instructions (Signed)

## 2018-03-17 NOTE — Progress Notes (Signed)
This very nice 67 y.o. [name] presents for 3 month follow up with HTN, HLD, Pre-Diabetes and Vitamin D Deficiency. Patient also is followed by Dr Annamaria Boots for  COPD and hx/asthmatic exacerbations.      Patient is followed expectantly for labile HTN & BP has been controlled and today's BP is at goal - 122/70. Patient has had no complaints of any cardiac type chest pain, palpitations, dyspnea / orthopnea / PND, dizziness, claudication, or dependent edema.      Hyperlipidemia is controlled with diet & meds. Patient denies myalgias or other med SE's. Last Lipids were at goal; Lab Results  Component Value Date   CHOL 174 11/10/2017   HDL 76 11/10/2017   LDLCALC 79 11/10/2017   TRIG 103 11/10/2017   CHOLHDL 2.3 11/10/2017      Also, the patient is screened proactively for PreDiabetes  (A1c 6.2%/Feb2015,  A1c6.1%/Oct2015, then 6.2%/Apr/2016 and 5.9%Oct2016)  and has had no symptoms of reactive hypoglycemia, diabetic polys, paresthesias or visual blurring.  Last A1c was Normal & at goal; Lab Results  Component Value Date   HGBA1C 5.4 11/12/2016      Further, the patient also has history of Vitamin D Deficiency and supplements vitamin D without any suspected side-effects. Last vitamin D was still low: Lab Results  Component Value Date   VD25OH 40 05/13/2016   Current Outpatient Medications on File Prior to Visit  Medication Sig  . albuterol (PROAIR HFA) 108 (90 Base) MCG/ACT inhaler Inhale 1 to 2 inhalations 10-15 minutes apart every 4 hours  if needed to rescue asthma  . aspirin 81 MG tablet Take 81 mg by mouth daily.  . B Complex-C (SUPER B COMPLEX PO) Take by mouth daily.  . Cholecalciferol (VITAMIN D-3) 5000 UNITS TABS Take by mouth daily.  . Coenzyme Q10 (COQ10) 200 MG CAPS Take 200 mg by mouth daily.  . diclofenac sodium (VOLTAREN) 1 % GEL Apply 2 g topically 4 (four) times daily.  Marland Kitchen escitalopram (LEXAPRO) 20 MG tablet TAKE 1 TABLET BY MOUTH EVERY DAY  . estradiol (ESTRACE) 1 MG tablet  Take 1 mg by mouth daily.  . montelukast (SINGULAIR) 10 MG tablet Take 1 tablet (10 mg total) by mouth at bedtime.  . rosuvastatin (CRESTOR) 10 MG tablet Take 1 tablet (10 mg total) by mouth daily.  Marland Kitchen umeclidinium-vilanterol (ANORO ELLIPTA) 62.5-25 MCG/INH AEPB Inhale 1 puff into the lungs daily.   No current facility-administered medications on file prior to visit.    Allergies  Allergen Reactions  . Chantix [Varenicline] Anaphylaxis    Makes throat swell up   PMHx:   Past Medical History:  Diagnosis Date  . Acute perforated appendicitis 10/31/2014  . Adenomatous colon polyp   . Allergic rhinitis   . Allergy   . Anemia   . Arthritis    "fingers" (10/31/2014)  . Chronic bronchitis ([place])    "got it q yr til I had my nose OR" (10/31/2014)  . COPD (chronic obstructive pulmonary disease) ([place])   . DDD (degenerative disc disease), cervical   . DDD (degenerative disc disease), lumbar   . Depression    "short term when my sister passed away unexpectedly"  . Family history of adverse reaction to anesthesia    "mother is hard to wake up; a little goes a long way w/her"  . Family history of breast cancer   . Family history of colon cancer   . Family history of skin cancer   .  GERD (gastroesophageal reflux disease)   . History of stomach ulcers   . Hyperlipidemia    "borderline; RX is preventative" (10/31/2014)  . Kidney stones    "they passed"  . Migraine    "used to get them alot; don't get them anymore" (10/31/2014)  . Osteoarthritis of both knees   . Osteopenia   . Pneumonia 2003 X 1  . Pre-diabetes   . Seizures ([place]) ~ 1962 X 1   "from an abscessed wisdom tooth"  . Tobacco dependence   . Vitamin D deficiency    Immunization History  Administered Date(s) Administered  . Influenza Split 05/16/2013, 05/12/2014, 05/14/2015  . Influenza, High Dose Seasonal PF 05/15/2017  . Influenza, Seasonal, Injecte, Preservative Fre 05/13/2016  . Pneumococcal Conjugate-13 11/12/2016  .  Pneumococcal Polysaccharide-23 06/14/2011  . Tdap 03/27/2010  . Zoster 04/14/2012   Past Surgical History:  Procedure Laterality Date  . APPENDECTOMY    . ARTHROTOMY Right 01/11/2016   Procedure: DISTAL INTERPHALANGEAL JOINT ARTHROTOMY RIGHT INDEX FINGER;  Surgeon: Leanora Cover, MD;  Location: [facility];  Service: Orthopedics;  Laterality: Right;  . COLONOSCOPY    . CYST EXCISION Right 01/11/2016   Procedure: EXCISION MUCOID CYST;  Surgeon: Leanora Cover, MD;  Location: [facility];  Service: Orthopedics;  Laterality: Right;  . LAPAROSCOPIC APPENDECTOMY N/A 10/31/2014   Procedure: APPENDECTOMY LAPAROSCOPIC;  Surgeon: Alphonsa Overall, MD;  Location: [facility];  Service: General;  Laterality: N/A;  . SALPINGOOPHORECTOMY Right 1999   "w/grapefruit-sized polyp"  . SHOULDER ARTHROSCOPY Right 2003   removal spurs  . SHOULDER ARTHROSCOPY W/ ROTATOR CUFF REPAIR Left 2012  . SINUS EXPLORATION  12/2013   [facility]  . TENDON REPAIR Left 10/2009   torn tendon @ elbow  . VAGINAL HYSTERECTOMY  1978   FHx:    Reviewed / unchanged  SHx:    Reviewed / unchanged   Systems Review:  Constitutional: Denies fever, chills, wt changes, headaches, insomnia, fatigue, night sweats, change in appetite. Eyes: Denies redness, blurred vision, diplopia, discharge, itchy, watery eyes.  ENT: Denies discharge, congestion, post nasal drip, epistaxis, sore throat, earache, hearing loss, dental pain, tinnitus, vertigo, sinus pain, snoring.  CV: Denies chest pain, palpitations, irregular heartbeat, syncope, dyspnea, diaphoresis, orthopnea, PND, claudication or edema. Respiratory: denies cough, dyspnea, DOE, pleurisy, hoarseness, laryngitis, wheezing.  Gastrointestinal: Denies dysphagia, odynophagia, heartburn, reflux, water brash, abdominal pain or cramps, nausea, vomiting, bloating, diarrhea, constipation, hematemesis, melena, hematochezia  or hemorrhoids. Genitourinary: Denies dysuria, frequency,  urgency, nocturia, hesitancy, discharge, hematuria or flank pain. Musculoskeletal: Denies arthralgias, myalgias, stiffness, jt. swelling, pain, limping or strain/sprain.  Skin: Denies pruritus, rash, hives, warts, acne, eczema or change in skin lesion(s). Neuro: No weakness, tremor, incoordination, spasms, paresthesia or pain. Psychiatric: Denies confusion, memory loss or sensory loss. Endo: Denies change in weight, skin or hair change.  Heme/Lymph: No excessive bleeding, bruising or enlarged lymph nodes.  Physical Exam  BP 122/70   Pulse 68   Temp (!) 97.5 F (36.4 C)   Resp 16   Ht 5\' 4"  (1.626 m)   Wt 170 lb 3.2 oz (77.2 kg)   BMI 29.21 kg/m   Appears  well nourished, well groomed  and in no distress.  Eyes: PERRLA, EOMs, conjunctiva no swelling or erythema. Sinuses: No frontal/maxillary tenderness ENT/Mouth: EAC's clear, TM's nl w/o erythema, bulging. Nares clear w/o erythema, swelling, exudates. Oropharynx clear without erythema or exudates. Oral hygiene is good. Tongue normal, non obstructing. Hearing intact.  Neck: Supple. Thyroid  not palpable. Car 2+/2+ without bruits, nodes or JVD. Chest: Respirations nl with BS clear & equal w/o rales, rhonchi, wheezing or stridor.  Cor: Heart sounds normal w/ regular rate and rhythm without sig. murmurs, gallops, clicks or rubs. Peripheral pulses normal and equal  without edema.  Abdomen: Soft & bowel sounds normal. Non-tender w/o guarding, rebound, hernias, masses or organomegaly.  Lymphatics: Unremarkable.  Musculoskeletal: Full ROM all peripheral extremities, joint stability, 5/5 strength and normal gait.  Skin: Warm, dry without exposed rashes, lesions or ecchymosis apparent.  Neuro: Cranial nerves intact, reflexes equal bilaterally. Sensory-motor testing grossly intact. Tendon reflexes grossly intact.  Pysch: Alert & oriented x 3.  Insight and judgement nl & appropriate. No ideations.  Assessment and Plan:  1. Elevated BP without  diagnosis of hypertension  - Continue medication, monitor blood pressure at home.  - Continue DASH diet.  Reminder to go to the ER if any CP,  SOB, nausea, dizziness, severe HA, changes vision/speech.  - CBC with Differential/Platelet - COMPLETE METABOLIC PANEL WITH GFR - Magnesium - TSH  2. Hyperlipidemia, mixed  - Continue diet/meds, exercise,& lifestyle modifications.  - Continue monitor periodic cholesterol/liver & renal functions   - Lipid panel - TSH  3. Abnormal glucose  - Continue diet, exercise, lifestyle modifications.  - Monitor appropriate labs.  - Hemoglobin A1c - Insulin, random  4. Vitamin D deficiency  - Continue supplementation.   - VITAMIN D 25 Hydroxyl  5. Prediabetes  - Hemoglobin A1c - Insulin, random  6. COPD mixed type ([place])  - CBC with Differential/Platelet - COMPLETE METABOLIC PANEL WITH GFR - Magnesium - Lipid panel - TSH - Hemoglobin A1c - Insulin, random - VITAMIN D 25 Hydroxy      Discussed  regular exercise, BP monitoring, weight control to achieve/maintain BMI less than 25 and discussed med and SE's. Recommended labs to assess and monitor clinical status with further disposition pending results of labs. Over 30 minutes of exam, counseling, chart review was performed.

## 2018-03-18 LAB — MAGNESIUM: Magnesium: 2.3 mg/dL (ref 1.5–2.5)

## 2018-03-18 LAB — COMPLETE METABOLIC PANEL WITH GFR
AG Ratio: 1.8 (calc) (ref 1.0–2.5)
ALT: 16 U/L (ref 6–29)
AST: 16 U/L (ref 10–35)
Albumin: 4.3 g/dL (ref 3.6–5.1)
Alkaline phosphatase (APISO): 65 U/L (ref 33–130)
BUN: 13 mg/dL (ref 7–25)
CO2: 25 mmol/L (ref 20–32)
Calcium: 9.2 mg/dL (ref 8.6–10.4)
Chloride: 108 mmol/L (ref 98–110)
Creat: 0.88 mg/dL (ref 0.50–0.99)
GFR, Est African American: 79 mL/min/{1.73_m2} (ref >=60)
GFR, Est Non African American: 68 mL/min/{1.73_m2} (ref >=60)
Globulin: 2.4 g/dL (calc) (ref 1.9–3.7)
Glucose, Bld: 102 mg/dL — ABNORMAL HIGH (ref 65–99)
Potassium: 4.6 mmol/L (ref 3.5–5.3)
Sodium: 141 mmol/L (ref 135–146)
Total Bilirubin: 0.4 mg/dL (ref 0.2–1.2)
Total Protein: 6.7 g/dL (ref 6.1–8.1)

## 2018-03-18 LAB — LIPID PANEL
Cholesterol: 187 mg/dL (ref <200)
HDL: 74 mg/dL (ref >50)
LDL Cholesterol (Calc): 93 mg/dL (calc)
Non-HDL Cholesterol (Calc): 113 mg/dL (calc) (ref <130)
Total CHOL/HDL Ratio: 2.5 (calc) (ref <5.0)
Triglycerides: 106 mg/dL (ref <150)

## 2018-03-18 LAB — CBC WITH DIFFERENTIAL/PLATELET
Basophils Absolute: 71 cells/uL (ref 0–200)
Basophils Relative: 1 %
Eosinophils Absolute: 142 cells/uL (ref 15–500)
Eosinophils Relative: 2 %
HCT: 43.9 % (ref 35.0–45.0)
Hemoglobin: 14.6 g/dL (ref 11.7–15.5)
Lymphs Abs: 1527 cells/uL (ref 850–3900)
MCH: 30.4 pg (ref 27.0–33.0)
MCHC: 33.3 g/dL (ref 32.0–36.0)
MCV: 91.3 fL (ref 80.0–100.0)
MPV: 10.4 fL (ref 7.5–12.5)
Monocytes Relative: 6.5 %
Neutro Abs: 4899 cells/uL (ref 1500–7800)
Neutrophils Relative %: 69 %
Platelets: 224 10*3/uL (ref 140–400)
RBC: 4.81 10*6/uL (ref 3.80–5.10)
RDW: 12.5 % (ref 11.0–15.0)
Total Lymphocyte: 21.5 %
WBC mixed population: 462 cells/uL (ref 200–950)
WBC: 7.1 10*3/uL (ref 3.8–10.8)

## 2018-03-18 LAB — HEMOGLOBIN A1C
Hgb A1c MFr Bld: 5.8 % of total Hgb — ABNORMAL HIGH (ref <5.7)
Mean Plasma Glucose: 120 (calc)
eAG (mmol/L): 6.6 (calc)

## 2018-03-18 LAB — TSH: TSH: 1.73 mIU/L (ref 0.40–4.50)

## 2018-03-18 LAB — INSULIN, RANDOM: Insulin: 9.4 u[IU]/mL (ref 2.0–19.6)

## 2018-03-18 LAB — VITAMIN D 25 HYDROXY (VIT D DEFICIENCY, FRACTURES): Vit D, 25-Hydroxy: 43 ng/mL (ref 30–100)

## 2018-05-08 DIAGNOSIS — Z23 Encounter for immunization: Secondary | ICD-10-CM | POA: Diagnosis not present

## 2018-05-25 ENCOUNTER — Other Ambulatory Visit: Payer: Self-pay | Admitting: Internal Medicine

## 2018-06-07 ENCOUNTER — Ambulatory Visit (INDEPENDENT_AMBULATORY_CARE_PROVIDER_SITE_OTHER): Payer: Medicare Other | Admitting: Internal Medicine

## 2018-06-07 ENCOUNTER — Encounter: Payer: Self-pay | Admitting: Internal Medicine

## 2018-06-07 VITALS — BP 124/72 | HR 67 | Ht 63.5 in | Wt 175.6 lb

## 2018-06-07 DIAGNOSIS — M940 Chondrocostal junction syndrome [Tietze]: Secondary | ICD-10-CM | POA: Diagnosis not present

## 2018-06-07 DIAGNOSIS — R079 Chest pain, unspecified: Secondary | ICD-10-CM | POA: Diagnosis not present

## 2018-06-07 DIAGNOSIS — R918 Other nonspecific abnormal finding of lung field: Secondary | ICD-10-CM

## 2018-06-07 DIAGNOSIS — F17200 Nicotine dependence, unspecified, uncomplicated: Secondary | ICD-10-CM

## 2018-06-07 MED ORDER — FLUTICASONE-UMECLIDIN-VILANT 100-62.5-25 MCG/INH IN AEPB: 1.0000 | INHALATION_SPRAY | Freq: Every day | RESPIRATORY_TRACT | 0 refills | Status: DC

## 2018-06-07 NOTE — Patient Instructions (Signed)
Order- referral to cardiology ( has seen Dr Claiborne Billings remotely)     for dx chest pain  Sample x 2 Trelegy Ellipta      Inhale 1 puff, once daily     Try this instead of Anoro    Please call if we can help

## 2018-06-07 NOTE — Assessment & Plan Note (Signed)
We discussed plans for CT follow-up of lung nodule in this smoker.

## 2018-06-07 NOTE — Assessment & Plan Note (Signed)
Lung function has been well maintained.  I made sure she understood she has been lucky so far but it is really important that she stop smoking before she develops a reversible problems.

## 2018-06-07 NOTE — Progress Notes (Signed)
HPI female smoker followed for allergic rhinitis, recurrent acute bronchitis, tobacco use, lung nodules Allergy Profile 06/16/13 Total IgE 41.7   Pos dust mite, dog, [place] grass, ragwee Office Spirometry 06/13/2015-within normal limits. FVC 3.55/117%, FEV1 2.64/111%, FEV1/FVC 0.74 PFT 10/31/2013-within normal limits with no response to bronchodilator. FVC 3.91/120%, FEV1 3.08/123%, FEV1/FEC 0.79, FEF 25-75% 2.90/129%. TLC 113%, DLCO 86%. 6 minute walk test 10/31/2013-94%, 97%, 97%, 480 m. Normal oxygenation. Office Spirometry 06/13/2015-within normal limits. FVC 3.55/117%, FEV1 2.64/111%, FEV1/FVC 0.74 PFT 06/13/2016- ----------------------------------------------------------------------------------------------  06/05/17- 67 year old female smoker followed for allergic rhinitis, recurrent acute bronchitis, tobacco use ---Pt states that she has been doing good. States that she has a "dry spot" in throat that makes her cough a lot and does have SOB with strenuous activity. Denies any CP.  Has been persistent for 4 months. Continues to smoke, but blames spring and fall pollens for variable dry cough sensation from her throat.  Denies fever, blood, adenopath and swallowing. Nebulizer/albuterol and albuterol rescue inhaler used occasionally. Breo 100.  UTD f&p  06/07/2018- 67 year old female smoker followed for allergic rhinitis, COPD, tobacco use, lung nodules, complicated by depression, degenerative disc disease, -----COPD mixed type: Pt states she can tell she is unable to get a deep breath lately.,Notices it more when singing.  Singulair, pro-air HFA, Anoro Ellipta, Complains of gradually worsening dyspnea with exertion and difficulty holding a note when she sings.  Does not feel she is taking as deep of breath and recognizes problem may be weight gain. Unfortunately continues to smoke-discussed. For 6 months has been having intermittent nonradiating pains across upper anterior chest, with no  recognized association to position, movement, exertion or meals.  Pains last a couple of minutes.  Denies palpitation or sweat. We reviewed chest CT results from August with stable left upper lobe nodule, anticipating follow-up chest CT in 6 to 12 months. CT chest 03/01/2018- IMPRESSION: Stable 6 mm nodular density is seen adjacent to bulla formation in left upper lobe. Follow-up unenhanced chest CT in 12 months is recommended to ensure stability and rule out neoplasm. Coronary artery calcifications are noted suggesting coronary artery disease. Stable right hepatic cyst. Aortic Atherosclerosis (ICD10-I70.0).  ROS-see HPI + = positive Constitutional:   No-   weight loss, night sweats, fevers, chills, fatigue, lassitude. HEENT:   +headaches, difficulty swallowing, tooth/dental problems, sore throat,       sneezing,no- itching, ear ache, +nasal congestion, +post nasal drip,  CV:  + chest pain, orthopnea, PND, swelling in lower extremities, anasarca,  dizziness, palpitations Resp:+shortness of breath with exertion or at rest.             productive cough, +-non-productive cough,  No- coughing up of blood.              No-   change in color of mucus.  No- wheezing Skin: No-   rash or lesions. GI:  No-   heartburn, indigestion, abdominal pain, nausea, vomiting,  GU:  MS:  No-   joint pain or swelling.   Neuro-     nothing unusual Psych:  No- change in mood or affect. No depression or anxiety.  No memory loss.  OBJ- Physical Exam General- Alert, Oriented, Affect-appropriate, Distress- none acute.  Skin- rash-none, lesions- none, excoriation- none Lymphadenopathy- none Head- atraumatic            Eyes- Gross vision intact, PERRLA, conjunctivae and secretions clear            Ears- Hearing, canals-normal  Nose- Clear, no-Septal dev, mucus, polyps, erosion, perforation             Throat- Mallampati II , mucosa clear , drainage- none, tonsils- atrophic Neck- flexible , trachea  midline, no stridor , thyroid nl, carotid no bruit Chest - symmetrical excursion , unlabored           Heart/CV- RRR , no murmur , no gallop  , no rub, nl s1 s2                           - JVD- none , edema- none, stasis changes- none, varices- none           Lung- +diminished/ clear, wheeze -none, cough- none , dullness-none, rub- none           Chest wall- ? tenderness left mid costosternal joint Abd-  Br/ Gen/ Rectal- Not done, not indicated Extrem- cyanosis- none, clubbing, none, atrophy- none, strength- nl Neuro- grossly intact to observation

## 2018-06-07 NOTE — Assessment & Plan Note (Signed)
Chest pain is suggestive of chest wall origin but she is concerned, she is a smoker, and she has calcium deposits in arterial walls on CT.  She understands this does not necessarily indicate arterial obstruction.  She has seen Dr. Claiborne Billings in the past and would like reevaluation for peace of mind.

## 2018-07-08 ENCOUNTER — Encounter: Payer: Self-pay | Admitting: Cardiovascular Disease

## 2018-07-08 ENCOUNTER — Ambulatory Visit (INDEPENDENT_AMBULATORY_CARE_PROVIDER_SITE_OTHER): Payer: Medicare Other | Admitting: Cardiovascular Disease

## 2018-07-08 ENCOUNTER — Encounter (INDEPENDENT_AMBULATORY_CARE_PROVIDER_SITE_OTHER): Payer: Self-pay

## 2018-07-08 VITALS — BP 112/72 | HR 78 | Ht 63.5 in | Wt 177.8 lb

## 2018-07-08 DIAGNOSIS — I2510 Atherosclerotic heart disease of native coronary artery without angina pectoris: Secondary | ICD-10-CM | POA: Diagnosis not present

## 2018-07-08 DIAGNOSIS — Z720 Tobacco use: Secondary | ICD-10-CM | POA: Diagnosis not present

## 2018-07-08 DIAGNOSIS — E785 Hyperlipidemia, unspecified: Secondary | ICD-10-CM | POA: Diagnosis not present

## 2018-07-08 DIAGNOSIS — J449 Chronic obstructive pulmonary disease, unspecified: Secondary | ICD-10-CM | POA: Diagnosis not present

## 2018-07-08 DIAGNOSIS — R011 Cardiac murmur, unspecified: Secondary | ICD-10-CM | POA: Diagnosis not present

## 2018-07-08 DIAGNOSIS — Z01812 Encounter for preprocedural laboratory examination: Secondary | ICD-10-CM

## 2018-07-08 MED ORDER — METOPROLOL TARTRATE 100 MG PO TABS: 100.0000 mg | ORAL_TABLET | Freq: Once | ORAL | 0 refills | Status: DC

## 2018-07-08 MED ORDER — ROSUVASTATIN CALCIUM 20 MG PO TABS: 20.0000 mg | ORAL_TABLET | Freq: Every day | ORAL | 3 refills | Status: DC

## 2018-07-08 NOTE — Patient Instructions (Signed)
Medication Instructions:  INCREASE rosuvastatin (Crestor) to 20 mg daily  If you need a refill on your cardiac medications before your next appointment, please call your pharmacy.   Testing/Procedures: Your physician has requested that you have cardiac CT. Cardiac computed tomography (CT) is a painless test that uses an x-ray machine to take clear, detailed pictures of your heart. For further information please visit [email or web address]. Please follow instruction sheet as given.  Your physician has requested that you have an echocardiogram. Echocardiography is a painless test that uses sound waves to create images of your heart. It provides your doctor with information about the size and shape of your heart and how well your heart's chambers and valves are working. This procedure takes approximately one hour. There are no restrictions for this procedure. This will be done at our [facility] location:  [place]: At [facility], you and your health needs are our priority.  As part of our continuing mission to provide you with exceptional heart care, we have created designated Provider Care Teams.  These Care Teams include your primary Cardiologist (physician) and Advanced Practice Providers (APPs -  Physician Assistants and Nurse Practitioners) who all work together to provide you with the care you need, when you need it. You will need a follow up appointment in 2 months.  Please call our office 2 months in advance to schedule this appointment.  You may see Dr. Claiborne Billings or one of the following Advanced Practice Providers on your designated Care Team: [place], [place] . Fabian Sharp, PA-C

## 2018-07-08 NOTE — Progress Notes (Signed)
Cardiology Office Note    Date:  07/15/2018   ID:  Karen Barrera, DOB 01-Nov-1950, MRN [id number]  Pulmonary: Dr. Keturah Barre PCP:  [name], MD  Cardiologist:  Shelva Majestic, MD   New cardiology evaluation referred through the courtesy of Dr. Keturah Barre for evaluation of coronary calcification.  History of Present Illness:  Karen Barrera is a 67 y.o. female who is the daughter of my patient, Karen Barrera.  She is followed by Dr. Vicente Serene for primary care and Dr. Annamaria Boots for [place].  She is referred for cardiology evaluation after being found to have coronary calcification.  Karen Barrera has a long history of tobacco use.  She started smoking at age 4 and typically has smoked 1 pack/day.  She has smoked for 57 years and stopped during her pregnancies.  She is followed by Dr. Annamaria Boots for allergic rhinitis, recurrent acute bronchitis, as well as lung nodules.  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  She had recently undergone a chest CT which showed a stable left upper lobe nodule adjacent to bulla formation.  It was recommended that she undergo unenhanced chest CT in 12 months.  On her CT she was found to have coronary artery calcifications as well as a stable right hepatic cyst.  She admits to occasional episodes of left chest wall like discomfort.  Occasionally she has noticed some sporadic chest tightness but typically this has not been exertional.  Usually is short-lived and last only 2 to 3 minutes.  In addition to her longstanding smoking history, there is family history for cardiovascular disease.  Remotely, she believes she may have had a stress test in a years ago.  She has a history of a cracked sternum approximately 3 years ago.  She is now referred for cardiology evaluation.   Past Medical History:  Diagnosis Date  . Acute perforated appendicitis 10/31/2014  . Adenomatous colon polyp   . Allergic rhinitis   . Allergy   . Anemia   . Arthritis    "fingers" (10/31/2014)  .  Chronic bronchitis ([place])    "got it q yr til I had my nose OR" (10/31/2014)  . COPD (chronic obstructive pulmonary disease) ([place])   . DDD (degenerative disc disease), cervical   . DDD (degenerative disc disease), lumbar   . Depression    "short term when my sister passed away unexpectedly"  . Family history of adverse reaction to anesthesia    "mother is hard to wake up; a little goes a long way w/her"  . Family history of breast cancer   . Family history of colon cancer   . Family history of skin cancer   . GERD (gastroesophageal reflux disease)   . History of stomach ulcers   . Hyperlipidemia    "borderline; RX is preventative" (10/31/2014)  . Kidney stones    "they passed"  . Migraine    "used to get them alot; don't get them anymore" (10/31/2014)  . Osteoarthritis of both knees   . Osteopenia   . Pneumonia 2003 X 1  . Pre-diabetes   . Seizures ([place]) ~ 1962 X 1   "from an abscessed wisdom tooth"  . Tobacco dependence   . Vitamin D deficiency     Past Surgical History:  Procedure Laterality Date  . APPENDECTOMY    . ARTHROTOMY Right 01/11/2016   Procedure: DISTAL INTERPHALANGEAL JOINT ARTHROTOMY RIGHT INDEX FINGER;  Surgeon: Leanora Cover, MD;  Location: [facility];  Service: Orthopedics;  Laterality: Right;  . COLONOSCOPY    . CYST EXCISION Right 01/11/2016   Procedure: EXCISION MUCOID CYST;  Surgeon: Leanora Cover, MD;  Location: [facility];  Service: Orthopedics;  Laterality: Right;  . LAPAROSCOPIC APPENDECTOMY N/A 10/31/2014   Procedure: APPENDECTOMY LAPAROSCOPIC;  Surgeon: Alphonsa Overall, MD;  Location: [facility];  Service: General;  Laterality: N/A;  . SALPINGOOPHORECTOMY Right 1999   "w/grapefruit-sized polyp"  . SHOULDER ARTHROSCOPY Right 2003   removal spurs  . SHOULDER ARTHROSCOPY W/ ROTATOR CUFF REPAIR Left 2012  . SINUS EXPLORATION  12/2013   [facility]  . TENDON REPAIR Left 10/2009   torn tendon @ elbow  . VAGINAL HYSTERECTOMY  1978    Current  Medications: Outpatient Medications Prior to Visit  Medication Sig Dispense Refill  . albuterol (PROAIR HFA) 108 (90 Base) MCG/ACT inhaler Inhale 1 to 2 inhalations 10-15 minutes apart every 4 hours  if needed to rescue asthma 48 g 3  . aspirin 81 MG tablet Take 81 mg by mouth daily.    . B Complex-C (SUPER B COMPLEX PO) Take by mouth daily.    . Cholecalciferol (VITAMIN D-3) 5000 UNITS TABS Take by mouth daily.    . Coenzyme Q10 (COQ10) 200 MG CAPS Take 200 mg by mouth daily.    . diclofenac sodium (VOLTAREN) 1 % GEL Apply 2 g topically 4 (four) times daily. 100 g 3  . estradiol (ESTRACE) 1 MG tablet Take 1 mg by mouth daily.  3  . Fluticasone-Umeclidin-Vilant (TRELEGY ELLIPTA) 100-62.5-25 MCG/INH AEPB Inhale 1 puff into the lungs daily. 2 each 0  . montelukast (SINGULAIR) 10 MG tablet TAKE 1 TABLET AT BEDTIME 90 tablet 1  . sertraline (ZOLOFT) 100 MG tablet Take 1 tablet daily for Mood 90 tablet 3  . umeclidinium-vilanterol (ANORO ELLIPTA) 62.5-25 MCG/INH AEPB Inhale 1 puff into the lungs daily. 3 each 3  . rosuvastatin (CRESTOR) 10 MG tablet Take 1 tablet (10 mg total) by mouth daily. 90 tablet 1   No facility-administered medications prior to visit.      Allergies:   Chantix [varenicline] and Moxifloxacin hcl in nacl   Social History   Socioeconomic History  . Marital status: Married    Spouse name: Not on file  . Number of children: Not on file  . Years of education: Not on file  . Highest education level: Not on file  Occupational History  . Occupation: Retired  Scientific laboratory technician  . Financial resource strain: Not on file  . Food insecurity:    Worry: Not on file    Inability: Not on file  . Transportation needs:    Medical: Not on file    Non-medical: Not on file  Tobacco Use  . Smoking status: Current Every Day Smoker    Packs/day: 1.00    Years: 25.00    Pack years: 25.00    Types: Cigarettes  . Smokeless tobacco: Never Used  . Tobacco comment: Pt currently smoking  .75ppd as of 06/05/17  Substance and Sexual Activity  . Alcohol use: No    Alcohol/week: 0.0 standard drinks  . Drug use: No  . Sexual activity: Not Currently  Lifestyle  . Physical activity:    Days per week: Not on file    Minutes per session: Not on file  . Stress: Not on file  Relationships  . Social connections:    Talks on phone: Not on file    Gets together: Not on file  Attends religious service: Not on file    Active member of club or organization: Not on file    Attends meetings of clubs or organizations: Not on file    Relationship status: Not on file  Other Topics Concern  . Not on file  Social History Narrative  . Not on file    Social history is notable in that she is married for 50 years.  She has 2 children ages 8 and 68.  One child was stillborn at 73 months.  She is retired.  She has a BS in business and marketing.  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  She does not routinely exercise.  Family History:  The patient's family history includes Arthritis in her paternal grandmother; Breast cancer in an other family member; Breast cancer (age of onset: 37) in an other family member; Cancer in an other family member; Colon cancer in an other family member; Colon cancer (age of onset: 42) in some other family members; Colon polyps in her father; Diabetes in her mother; Hyperlipidemia in her maternal grandfather; Melanoma (age of onset: 11) in her paternal grandmother; Seizures in her sister; Skin cancer (age of onset: 3) in her mother; Stroke in her maternal grandmother.   ROS General: Negative; No fevers, chills, or night sweats;  HEENT: Negative; No changes in vision or hearing, sinus congestion, difficulty swallowing Pulmonary: COPD, lung nodule Cardiovascular: See HPI GI: Negative; No nausea, vomiting, diarrhea, or abdominal pain GU: Negative; No dysuria, hematuria, or difficulty voiding Musculoskeletal: Negative; no myalgias,  joint pain, or weakness Hematologic/Oncology: Negative; no easy bruising, bleeding Endocrine: Negative; no heat/cold intolerance; no diabetes Neuro: Negative; no changes in balance, headaches Skin: Negative; No rashes or skin lesions Psychiatric: Negative; No behavioral problems, depression Sleep: Negative; No snoring, daytime sleepiness, hypersomnolence, bruxism, restless legs, hypnogognic hallucinations, no cataplexy Other comprehensive 14 point system review is negative.   PHYSICAL EXAM:   VS:  BP 112/72   Pulse 78   Ht 5' 3.5" (1.613 m)   Wt 177 lb 12.8 oz (80.6 kg)   BMI 31.00 kg/m    Repeat blood pressure by me 116/70  Wt Readings from Last 3 Encounters:  07/08/18 177 lb 12.8 oz (80.6 kg)  06/07/18 175 lb 9.6 oz (79.7 kg)  03/17/18 170 lb 3.2 oz (77.2 kg)    General: Alert, oriented, no distress.  Skin: normal turgor, no rashes, warm and dry HEENT: Normocephalic, atraumatic. Pupils equal round and reactive to light; sclera anicteric; extraocular muscles intact; Fundi without hemorrhages or exudates Nose without nasal septal hypertrophy Mouth/Parynx benign; Mallinpatti scale 3 Neck: No JVD, no carotid bruits; normal carotid upstroke Lungs: clear to ausculatation and percussion; no wheezing or rales Chest wall: without tenderness to palpitation Heart: PMI not displaced, RRR, s1 s2 normal, 1/6 systolic murmur, no diastolic murmur, no rubs, gallops, thrills, or heaves Abdomen: soft, nontender; no hepatosplenomehaly, BS+; abdominal aorta nontender and not dilated by palpation. Back: no CVA tenderness Pulses 2+ Musculoskeletal: full range of motion, normal strength, no joint deformities Extremities: no clubbing cyanosis or edema, Homan's sign negative  Neurologic: grossly nonfocal; Cranial nerves grossly wnl Psychologic: Normal mood and affect   Studies/Labs Reviewed:   EKG:  EKG is ordered today.  ECG (independently read by me): Sinus rhythm at 78 bpm.  No ectopy.   Normal intervals.  Early transition.  Recent Labs: BMP Latest Ref Rng & Units 03/17/2018 11/10/2017 08/06/2017  Glucose 65 - 99 mg/dL 102(H) 102(H)  75  BUN 7 - 25 mg/dL _0 Creatinine 0.50 - 0.99 mg/dL 0.88 0.81 0.90  BUN/Creat Ratio 6 - 22 (calc) NOT APPLICABLE NOT APPLICABLE NOT APPLICABLE  Sodium 099 - 146 mmol/L 141 142 139  Potassium 3.5 - 5.3 mmol/L 4.6 5.0 4.6  Chloride 98 - 110 mmol/L 108 109 106  CO2 20 - 32 mmol/L _1 Calcium 8.6 - 10.4 mg/dL 9.2 9.5 9.1     Hepatic Function Latest Ref Rng & Units 03/17/2018 11/10/2017 08/06/2017  Total Protein 6.1 - 8.1 g/dL 6.7 6.5 6.6  Albumin 3.6 - 5.1 g/dL - - -  AST 10 - 35 U/L _2 ALT 6 - 29 U/L _3 Alk Phosphatase 33 - 130 U/L - - -  Total Bilirubin 0.2 - 1.2 mg/dL 0.4 0.4 0.3  Bilirubin, Direct 0.0 - 0.2 mg/dL - 0.1 0.1    CBC Latest Ref Rng & Units 03/17/2018 11/10/2017 08/06/2017  WBC 3.8 - 10.8 Thousand/uL 7.1 7.5 8.9  Hemoglobin 11.7 - 15.5 g/dL 14.6 13.9 14.1  Hematocrit 35.0 - 45.0 % 43.9 40.3 41.1  Platelets 140 - 400 Thousand/uL 224 213 235   Lab Results  Component Value Date   MCV 91.3 03/17/2018   MCV 90.0 11/10/2017   MCV 89.2 08/06/2017   Lab Results  Component Value Date   TSH 1.73 03/17/2018   Lab Results  Component Value Date   HGBA1C 5.8 (H) 03/17/2018     BNP No results found for: BNP  ProBNP No results found for: PROBNP   Lipid Panel     Component Value Date/Time   CHOL 187 03/17/2018 1051   TRIG 106 03/17/2018 1051   HDL 74 03/17/2018 1051   CHOLHDL 2.5 03/17/2018 1051   VLDL 29 11/12/2016 1730   LDLCALC 93 03/17/2018 1051     RADIOLOGY: No results found.   Additional studies/ records that were reviewed today include:  Reviewed the records of Dr. Annamaria Boots, her spirometry, chest CT and laboratory.  ASSESSMENT:    1. Coronary artery calcification seen on CT scan   2. Murmur   3. Pre-procedure lab exam   4. Chronic obstructive pulmonary disease, unspecified  COPD type ([place])   5. Hyperlipidemia with target LDL less than 70   6. Tobacco abuse     PLAN:  Karen Barrera is a 67 year old female who has a 57-year history of tobacco use, having started at age 79.  He has a history of surgical rhinitis, recurrent acute bronchitis, mixed COPD and has been on Singulair, pro-air, and Anora Ellipta.  She has had intermittent episodes of nonradiating short-lived chest discomfort, not typically exertionally precipitated.  A recent chest CT from August 2019 revealed stable left upper lobe nodule but she was noted to have coronary artery calcification suggesting CAD.  Although she specifically denies any exertional type chest pain and there is a remote history of sternal injury with her longstanding tobacco history, and family history for CAD I have recommended she undergo CT coronary angiogram to assess for CAD.  Her most recent lipid panel shows an LDL of 93.  With her evidence for coronary calcification and her tobacco history target LDL is less than 70.  She has been on low-dose rosuvastatin at 10 mg for the past 5 years.  I have suggested she increase this to 20 mg.  She has a 1/6 systolic murmur.  I am scheduling her for 2D echo Doppler study.  I had a long discussion with her regarding smoking cessation.  I will see her back in the office in follow-up of the above studies and further recommendations will be made at that time.   Medication Adjustments/Labs and Tests Ordered: Current medicines are reviewed at length with the patient today.  Concerns regarding medicines are outlined above.  Medication changes, Labs and Tests ordered today are listed in the Patient Instructions below. Patient Instructions  Medication Instructions:  INCREASE rosuvastatin (Crestor) to 20 mg daily  If you need a refill on your cardiac medications before your next appointment, please call your pharmacy.   Testing/Procedures: Your physician has requested that you have cardiac CT.  Cardiac computed tomography (CT) is a painless test that uses an x-ray machine to take clear, detailed pictures of your heart. For further information please visit [email or web address]. Please follow instruction sheet as given.  Your physician has requested that you have an echocardiogram. Echocardiography is a painless test that uses sound waves to create images of your heart. It provides your doctor with information about the size and shape of your heart and how well your heart's chambers and valves are working. This procedure takes approximately one hour. There are no restrictions for this procedure. This will be done at our [facility] location:  [place]: At [facility], you and your health needs are our priority.  As part of our continuing mission to provide you with exceptional heart care, we have created designated Provider Care Teams.  These Care Teams include your primary Cardiologist (physician) and Advanced Practice Providers (APPs -  Physician Assistants and Nurse Practitioners) who all work together to provide you with the care you need, when you need it. You will need a follow up appointment in 2 months.  Please call our office 2 months in advance to schedule this appointment.  You may see Dr. Claiborne Billings or one of the following Advanced Practice Providers on your designated Care Team: [place], [place] . Fabian Sharp, PA-C      Signed, Shelva Majestic, MD  07/15/2018 7:43 AM    [facility] [address], [place], [place], [place]  [address] Phone: [phone]

## 2018-07-14 ENCOUNTER — Telehealth: Payer: Self-pay | Admitting: *Deleted

## 2018-07-14 ENCOUNTER — Other Ambulatory Visit: Payer: Self-pay

## 2018-07-14 ENCOUNTER — Ambulatory Visit (HOSPITAL_COMMUNITY): Payer: Medicare Other | Attending: Cardiology

## 2018-07-14 DIAGNOSIS — R011 Cardiac murmur, unspecified: Secondary | ICD-10-CM | POA: Diagnosis not present

## 2018-07-14 MED ORDER — ALBUTEROL SULFATE HFA 108 (90 BASE) MCG/ACT IN AERS: INHALATION_SPRAY | RESPIRATORY_TRACT | 3 refills | Status: DC

## 2018-07-14 NOTE — Telephone Encounter (Signed)
Patient called last week asking for a refill on ventolin inhaler says was never called in. Just asking for rx to be sent to [facility]

## 2018-07-14 NOTE — Telephone Encounter (Signed)
Done

## 2018-07-15 ENCOUNTER — Encounter: Payer: Self-pay | Admitting: Cardiovascular Disease

## 2018-07-15 ENCOUNTER — Encounter: Payer: Self-pay | Admitting: Physician Assistant

## 2018-07-15 NOTE — Addendum Note (Signed)
Addended by: Shelva Majestic A on: 07/15/2018 07:45 AM   Modules accepted: Level of Service

## 2018-07-29 ENCOUNTER — Ambulatory Visit (INDEPENDENT_AMBULATORY_CARE_PROVIDER_SITE_OTHER): Payer: Medicare Other | Admitting: *Deleted

## 2018-07-29 VITALS — BP 116/80 | HR 64 | Temp 97.8°F | Resp 16 | Ht 63.5 in | Wt 179.0 lb

## 2018-07-29 DIAGNOSIS — Z79899 Other long term (current) drug therapy: Secondary | ICD-10-CM | POA: Diagnosis not present

## 2018-07-29 DIAGNOSIS — R030 Elevated blood-pressure reading, without diagnosis of hypertension: Secondary | ICD-10-CM | POA: Diagnosis not present

## 2018-07-29 NOTE — Progress Notes (Signed)
Patient is here for a NV to check a BMET, per Dr Shelva Majestic, for upcoming procedure.

## 2018-07-30 LAB — BASIC METABOLIC PANEL
BUN: 14 mg/dL (ref 7–25)
CO2: 24 mmol/L (ref 20–32)
Calcium: 9.5 mg/dL (ref 8.6–10.4)
Chloride: 103 mmol/L (ref 98–110)
Creat: 0.84 mg/dL (ref 0.50–0.99)
Glucose, Bld: 89 mg/dL (ref 65–99)
Potassium: 4.7 mmol/L (ref 3.5–5.3)
Sodium: 136 mmol/L (ref 135–146)

## 2018-08-10 NOTE — Progress Notes (Signed)
CPE AND 3 MONTH   Assessment:   CPE 1 year Due colon 2021 Get [facility] 02/2018  Hot flashes Continue medications, continue bASA, follow up GYN -     TSH  Aortic atherosclerosis ([place]) Control blood pressure, cholesterol, glucose, increase exercise.   COPD mixed type ([place]) Advised to stop smoking, CT chest due, continue meds.   Recurrent major depressive disorder, in partial remission ([place]) - continue medications, stress management techniques discussed, increase water, good sleep hygiene discussed, increase exercise, and increase veggies.  -     TSH  Tobacco dependence Smoking cessation-  instruction/counseling given, counseled patient on the dangers of tobacco use, advised patient to stop smoking, and reviewed strategies to maximize success, patient not ready to quit at this time.   Medication management -     CBC with Differential/Platelet -     BASIC METABOLIC PANEL WITH GFR -     Hepatic function panel -     Magnesium  Mixed hyperlipidemia -continue medications, check lipids, decrease fatty foods, increase activity.  -     Lipid panel  Anemia, unspecified type -     CBC with Differential/Platelet  Vitamin D deficiency Continue supplement  Seasonal and perennial allergic rhinitis Get on allergy pill  Hx of adenomatous colonic polyps Follow up 2021  BMI 30  Overweight  - long discussion about weight loss, diet, and exercise -recommended diet heavy in fruits and veggies and low in animal meats, cheeses, and dairy products  Abnormal findings on diagnostic imaging of lung -     CT Chest Wo Contrast; Future- will get in August   Over 40 minutes of exam, counseling, chart review and critical decision making was performed Future Appointments  Date Time Provider [place]  08/11/2018  2:00 PM Vicie Mutters, PA-C [facility] None  08/13/2018  9:30 AM MC-CT 1 MC-CT [facility]  08/13/2018 10:00 AM MC-CT 1 MC-CT [facility]  09/13/2018  9:00 AM Almyra Deforest, [place] [facility]  [facility]  10/06/2018  9:30 AM Baird Lyons D, MD [facility] None  11/22/2018 10:45 AM Vicie Mutters, PA-C [facility] None  08/02/2019  2:00 PM Vicie Mutters, PA-C [facility] None     Subjective:  Karen Barrera is a 68 y.o. female who presents for CPE and 3 month follow up    Her blood pressure has been controlled at home, today their BP is BP: 130/80 She does workout. She denies chest pain, shortness of breath, dizziness.   She has COPD, continues to smoke, is on trelegy but states she does not see a difference with that versus the anoro and has albuterol PRN, she is working on quitting, can not take chantix. Has appointment with Dr. Claiborne Billings Feb 17th, sees Dr. Annamaria Boots in March. She had coronary artery calcifications on chest CT so has a calcium coronary score scheduled on the 17th. She has a lung nodule that needs a repeat CT chest 02/2019.   She is on cholesterol medication and denies myalgias. Her cholesterol is not at goal. The cholesterol last visit was:  Her crestor was increased from 10 mg to 20 mg and she states she has had more joint pain from it, worse left hip, left thigh and left knee worse with sitting for a long. She will have left leg pain at night with lying down. DEXA 2019.  Lab Results  Component Value Date   CHOL 187 03/17/2018   HDL 74 03/17/2018   LDLCALC 93 03/17/2018   TRIG 106 03/17/2018   CHOLHDL 2.5 03/17/2018  Last A1C  Lab Results  Component Value Date   HGBA1C 5.8 (H) 03/17/2018   Last GFR: Lab Results  Component Value Date   GFRNONAA 68 03/17/2018   Patient is on Vitamin D supplement.   Lab Results  Component Value Date   VD25OH 70 03/17/2018     She was on estrogen pills from GYN and lexapro for hot flashes/depression which is helping some, she is on low dose ASA.   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   BMI is Body mass index is  31.67 kg/m., she is working on diet and exercise. Wt Readings from Last 8 Encounters:  08/11/18 178 lb 12.8 oz (81.1 kg)  07/29/18 179 lb (81.2 kg)  07/08/18 177 lb 12.8 oz (80.6 kg)  06/07/18 175 lb 9.6 oz (79.7 kg)  03/17/18 170 lb 3.2 oz (77.2 kg)  11/10/17 174 lb 12.8 oz (79.3 kg)  08/06/17 176 lb 3.2 oz (79.9 kg)  06/05/17 172 lb 3.2 oz (78.1 kg)      Medication Review: Current Outpatient Medications on File Prior to Visit  Medication Sig Dispense Refill  . albuterol (PROAIR HFA) 108 (90 Base) MCG/ACT inhaler Inhale 1 to 2 inhalations 10-15 minutes apart every 4 hours  if needed to rescue asthma 48 g 3  . albuterol (PROVENTIL HFA;VENTOLIN HFA) 108 (90 Base) MCG/ACT inhaler Use 2 inhalations 5 minutes apart every 4 hours PRN. 18 g 3  . aspirin 81 MG tablet Take 81 mg by mouth daily.    . B Complex-C (SUPER B COMPLEX PO) Take by mouth daily.    . Cholecalciferol (VITAMIN D-3) 5000 UNITS TABS Take by mouth daily.    . Coenzyme Q10 (COQ10) 200 MG CAPS Take 200 mg by mouth daily.    . diclofenac sodium (VOLTAREN) 1 % GEL Apply 2 g topically 4 (four) times daily. 100 g 3  . estradiol (ESTRACE) 1 MG tablet Take 1 mg by mouth daily.  3  . Fluticasone-Umeclidin-Vilant (TRELEGY ELLIPTA) 100-62.5-25 MCG/INH AEPB Inhale 1 puff into the lungs daily. 2 each 0  . montelukast (SINGULAIR) 10 MG tablet TAKE 1 TABLET AT BEDTIME 90 tablet 1  . rosuvastatin (CRESTOR) 20 MG tablet Take 1 tablet (20 mg total) by mouth daily. 90 tablet 3  . sertraline (ZOLOFT) 100 MG tablet Take 1 tablet daily for Mood 90 tablet 3  . umeclidinium-vilanterol (ANORO ELLIPTA) 62.5-25 MCG/INH AEPB Inhale 1 puff into the lungs daily. 3 each 3  . metoprolol tartrate (LOPRESSOR) 100 MG tablet Take 1 tablet (100 mg total) by mouth once for 1 dose. Take 100 mg (1 tablet) TWO hours prior to CT 1 tablet 0   No current facility-administered medications on file prior to visit.     Allergies  Allergen Reactions  . Chantix  [Varenicline] Anaphylaxis    Makes throat swell up  . Moxifloxacin Hcl In Nacl Other (See Comments)    aches    Current Problems (verified) Patient Active Problem List   Diagnosis Date Noted  . Pulmonary nodules 11/18/2017  . Aortic atherosclerosis ([place]) 08/06/2017  . Genetic testing 04/24/2017  . Posterior tibial tendinitis, right leg 01/29/2017  . Medial epicondylitis of right elbow 12/11/2016  . Hot flashes 11/12/2016  . Obesity 11/12/2016  . Encounter for Medicare annual wellness exam 11/12/2016  . COPD mixed type ([place]) 06/05/2016  . Costochondritis 03/23/2015  .  Hx of adenomatous colonic polyps 01/23/2015  . Medication management 11/21/2014  . Depression   . Hyperlipidemia   . Anemia   . Vitamin D deficiency   . Tobacco dependence   . Seasonal and perennial allergic rhinitis 07/03/2013    Screening Tests Immunization History  Administered Date(s) Administered  . Influenza Split 05/16/2013, 05/12/2014, 05/14/2015  . Influenza, High Dose Seasonal PF 05/15/2017, 05/07/2018  . Influenza, Seasonal, Injecte, Preservative Fre 05/13/2016  . Pneumococcal Conjugate-13 11/12/2016  . Pneumococcal Polysaccharide-23 06/14/2011  . Tdap 03/27/2010  . Zoster 04/14/2012   Tetanus: 2011 Pneumovax:  2012 Prevnar 13: 2018 Flu vaccine: 2019 Zostavax: 2013  MGM: 02/2018  at GYN DEXA: 2019  At GYN Osteopenia PAP 2016 never abnormal, last one Colonoscopy: 2016, due in 5 years/2021 Dr. Carlean Purl PFT 05/2016 CT chest 05/2017  Names of Other Physician/Practitioners you currently use: 1. [facility] here for primary care 2. Dr. Dorena Cookey, eye doctor, last visit 2018 3. Dr. Luretha Rued, dentist, last visit Feb 2019 Patient Care Team: [name], MD as PCP - General (Internal Medicine) Lavonna Monarch, MD as Consulting Physician (Dermatology) Troy Sine, MD as Consulting Physician (Cardiology) Thornell Sartorius, MD as Consulting Physician  (Otolaryngology) Izora Gala, MD as Consulting Physician (Otolaryngology) Sable Feil, MD as Consulting Physician (Gastroenterology) Delila Pereyra, MD as Consulting Physician (Gynecology) Gatha Mayer, MD as Consulting Physician (Gastroenterology)  SURGICAL HISTORY She  has a past surgical history that includes Shoulder arthroscopy w/ rotator cuff repair (Left, 2012); Tendon repair (Left, 10/2009); Shoulder arthroscopy (Right, 2003); Salpingoophorectomy (Right, 1999); Sinus exploration (12/2013); Vaginal hysterectomy (1978); laparoscopic appendectomy (N/A, 10/31/2014); Appendectomy; Colonoscopy; Cyst excision (Right, 01/11/2016); and Arthrotomy (Right, 01/11/2016). FAMILY HISTORY Her family history includes Arthritis in her paternal grandmother; Breast cancer in an other family member; Breast cancer (age of onset: 60) in an other family member; Cancer in an other family member; Colon cancer in an other family member; Colon cancer (age of onset: 24) in some other family members; Colon polyps in her father; Diabetes in her mother; Hyperlipidemia in her maternal grandfather; Melanoma (age of onset: 2) in her paternal grandmother; Seizures in her sister; Skin cancer (age of onset: 36) in her mother; Stroke in her maternal grandmother. SOCIAL HISTORY She  reports that she has been smoking cigarettes. She has a 25.00 pack-year smoking history. She has never used smokeless tobacco. She reports that she does not drink alcohol or use drugs.   Review of Systems  Constitutional: Negative.   HENT: Negative.   Eyes: Negative.   Respiratory: Negative.   Cardiovascular: Negative.   Gastrointestinal: Negative for abdominal pain, blood in stool, constipation, diarrhea, heartburn, melena, nausea and vomiting.  Genitourinary: Negative.   Musculoskeletal: Positive for back pain, joint pain (left hip/left knee ) and myalgias. Negative for falls and neck pain.  Skin: Negative.   Neurological: Negative.    Endo/Heme/Allergies: Negative.   Psychiatric/Behavioral: Positive for memory loss. Negative for depression, hallucinations, substance abuse and suicidal ideas. The patient is nervous/anxious. The patient does not have insomnia.      Objective:     Today's Vitals   08/11/18 1347  BP: 130/80  Pulse: 78  Temp: 97.9 F (36.6 C)  SpO2: 98%  Weight: 178 lb 12.8 oz (81.1 kg)  Height: 5\' 3"  (1.6 m)   Body mass index is 31.67 kg/m.  General appearance: alert, no distress, WD/WN, female HEENT: normocephalic, sclerae anicteric, TMs pearly, nares patent, no discharge or erythema, pharynx  normal Oral cavity: MMM, no lesions Neck: supple, no lymphadenopathy, no thyromegaly, no masses Heart: RRR, normal S1, S2, no murmurs Lungs: CTA bilaterally, no wheezes, rhonchi, or rales Abdomen: +bs, soft, non tender, non distended, no masses, no hepatomegaly, no splenomegaly Musculoskeletal: nontender, no swelling, no obvious deformity. Normal gait, not antalgic, no SI pain, no mid line back pain, + greater trochanteric pain to palpation, good reflexes, distal neurovascular exam. Negative straight leg raise.  Extremities: no edema, no cyanosis, no clubbing Pulses: 2+ symmetric, upper and lower extremities, normal cap refill Neurological: alert, oriented x 3, CN2-12 intact, strength normal upper extremities and lower extremities, sensation normal throughout, DTRs 2+ throughout, no cerebellar signs, gait normal Psychiatric: normal affect, behavior normal, pleasant    Vicie Mutters, PA-C   08/11/2018

## 2018-08-11 ENCOUNTER — Encounter: Payer: Self-pay | Admitting: Physician Assistant

## 2018-08-11 ENCOUNTER — Ambulatory Visit (INDEPENDENT_AMBULATORY_CARE_PROVIDER_SITE_OTHER): Payer: Medicare Other | Admitting: Physician Assistant

## 2018-08-11 VITALS — BP 130/80 | HR 78 | Temp 97.9°F | Ht 63.0 in | Wt 178.8 lb

## 2018-08-11 DIAGNOSIS — Z79899 Other long term (current) drug therapy: Secondary | ICD-10-CM

## 2018-08-11 DIAGNOSIS — M5442 Lumbago with sciatica, left side: Secondary | ICD-10-CM

## 2018-08-11 DIAGNOSIS — F3342 Major depressive disorder, recurrent, in full remission: Secondary | ICD-10-CM | POA: Diagnosis not present

## 2018-08-11 DIAGNOSIS — R7309 Other abnormal glucose: Secondary | ICD-10-CM

## 2018-08-11 DIAGNOSIS — K219 Gastro-esophageal reflux disease without esophagitis: Secondary | ICD-10-CM

## 2018-08-11 DIAGNOSIS — R9389 Abnormal findings on diagnostic imaging of other specified body structures: Secondary | ICD-10-CM

## 2018-08-11 DIAGNOSIS — F17200 Nicotine dependence, unspecified, uncomplicated: Secondary | ICD-10-CM

## 2018-08-11 DIAGNOSIS — E559 Vitamin D deficiency, unspecified: Secondary | ICD-10-CM | POA: Diagnosis not present

## 2018-08-11 DIAGNOSIS — I10 Essential (primary) hypertension: Secondary | ICD-10-CM

## 2018-08-11 DIAGNOSIS — R918 Other nonspecific abnormal finding of lung field: Secondary | ICD-10-CM

## 2018-08-11 DIAGNOSIS — E782 Mixed hyperlipidemia: Secondary | ICD-10-CM | POA: Diagnosis not present

## 2018-08-11 DIAGNOSIS — I700 Atherosclerosis of aorta: Secondary | ICD-10-CM

## 2018-08-11 DIAGNOSIS — J449 Chronic obstructive pulmonary disease, unspecified: Secondary | ICD-10-CM

## 2018-08-11 MED ORDER — BUPROPION HCL ER (XL) 150 MG PO TB24: 150.0000 mg | ORAL_TABLET | ORAL | 2 refills | Status: DC

## 2018-08-11 MED ORDER — FAMOTIDINE 40 MG PO TABS: 40.0000 mg | ORAL_TABLET | Freq: Every evening | ORAL | 3 refills | Status: DC

## 2018-08-11 NOTE — Patient Instructions (Addendum)
[place]  [employer]  [phone] for more information or for a free program for smoking cessation help.   You can call QUIT SMART 1-800-QUIT-NOW for free nicotine patches or replacement therapy- if they are out- keep calling  [facility] Can call for smoking cessation classes, [phone]  If you have a smart phone, please look up Smoke Free app, this will help you stay on track and give you information about money you have saved, life that you have gained back and a ton of more information.    ADVANTAGES OF QUITTING SMOKING  Within 20 minutes, blood pressure decreases. Your pulse is at normal level.  After 8 hours, carbon monoxide levels in the blood return to normal. Your oxygen level increases.  After 24 hours, the chance of having a heart attack starts to decrease. Your breath, hair, and body stop smelling like smoke.  After 48 hours, damaged nerve endings begin to recover. Your sense of taste and smell improve.  After 72 hours, the body is virtually free of nicotine. Your bronchial tubes relax and breathing becomes easier.  After 2 to 12 weeks, lungs can hold more air. Exercise becomes easier and circulation improves.  After 1 year, the risk of coronary heart disease is cut in half.  After 5 years, the risk of stroke falls to the same as a nonsmoker.  After 10 years, the risk of lung cancer is cut in half and the risk of other cancers decreases significantly.  After 15 years, the risk of coronary heart disease drops, usually to the level of a nonsmoker.  You will have extra money to spend on things other than cigarettes.   Will start you on wellbutrin to try to help with mood/weight loss/stop smoking If you get nausea and HA after the first week please stop If you get anxious or snappy with people than stop the medication But this medication can help with energy, weight loss and mood It kicks in about 1-2 weeks And  can be stopped quickly  Do exercise for left hip bursitis Can take naproxen Aleve is an antiinflammatory, can take 1 pill twice a day for 2 weeks and then take as needed.  You can take tylenol (500mg ) or tylenol arthritis (650mg ) with the meloxicam/antiinflammatories. The max you can take of tylenol a day is 3000mg  daily, this is a max of 6 pills a day of the regular tyelnol (500mg ) or a max of 4 a day of the tylenol arthritis (650mg ) as long as no other medications you are taking contain tylenol.   Aleve can cause inflammation in your stomach and can cause ulcers or bleeding, this will look like black tarry stools Make sure you take your aleve with food Can take with pepcid- will send in for you  [place]  Being a woman you may not have the typical symptoms of a heart attack.  You may not have any pain OR you may have atypical pain such as jaw pain, upper back pain, arm pain, "my bra feels to tight" and you will often have symptoms with it like below.  Symptoms for a heart attack will likely occur when you exert your self or exercise and include: Shortness of breath Sweating Nausea Dizziness Fast or irregular heart beats Fatigue   It makes me feel better if my patients get their heart rate up with exercise once or twice a week and pay close attention to your body. If there  is ANY change in your exercise capacity or if you have symptoms above, please STOP and call 911 or call to come to the office.   Here is some information to help you keep your heart healthy: Move it! - Aim for 30 mins of activity every day. Take it slowly at first. Talk to [place] before starting any new exercise program.   Lose it.  -Body Mass Index (BMI) can indicate if you need to lose weight. A healthy range is 18.5-24.9. For a BMI calculator, go to [employer]  Waist Management -Excess abdominal fat is a risk factor for heart disease, diabetes, asthma, stroke and more. Ideal waist circumference  is less than 35" for women and less than 40" for men.   Eat Right -focus on fruits, vegetables, whole grains, and meals you make yourself. Avoid foods with trans fat and high sugar/sodium content.   Snooze or Snore? - Loud snoring can be a sign of sleep apnea, a significant risk factor for high blood pressure, heart attach, stroke, and heart arrhythmias.  Kick the habit -Quit Smoking! Avoid second hand smoke. A single cigarette raises your blood pressure for 20 mins and increases the risk of heart attack and stroke for the next 24 hours.   Are Aspirin and Supplements right for you? -Add ENTERIC COATED low dose 81 mg Aspirin daily OR can do every other day if you have easy bruising to protect your heart and head. As well as to reduce risk of Colon Cancer by 20 %, Skin Cancer by 26 % , Melanoma by 46% and Pancreatic cancer by 60%  Say "No to Stress -There may be little you can do about problems that cause stress. However, techniques such as long walks, meditation, and exercise can help you manage it.   Start Now! - Make changes one at a time and set reasonable goals to increase your likelihood of success.    GENERAL HEALTH GOALS  Know what a healthy weight is for you (roughly BMI <25) and aim to maintain this  Aim for 7+ servings of fruits and vegetables daily  70-80+ fluid ounces of water or unsweet tea for healthy kidneys  Limit to max 1 drink of alcohol per day; avoid smoking/tobacco  Limit animal fats in diet for cholesterol and heart health - choose grass fed whenever available  Avoid highly processed foods, and foods high in saturated/trans fats  Aim for low stress - take time to unwind and care for your mental health  Aim for 150 min of moderate intensity exercise weekly for heart health, and weights twice weekly for bone health  Aim for 7-9 hours of sleep daily      When it comes to diets, agreement about the perfect plan isn't easy to find, even among the  experts. Experts at the [place] [employer] developed an idea known as the Healthy Eating Plate. Just imagine a plate divided into logical, healthy portions.  The emphasis is on diet quality:  Load up on vegetables and fruits - one-half of your plate: Aim for color and variety, and remember that potatoes don't count.  Go for whole grains - one-quarter of your plate: Whole wheat, barley, wheat berries, quinoa, oats, brown rice, and foods made with them. If you want pasta, go with whole wheat pasta.  Protein power - one-quarter of your plate: Fish, chicken, beans, and nuts are all healthy, versatile protein sources. Limit red meat.  The diet, however, does go beyond the plate, offering  a few other suggestions.  Use healthy plant oils, such as olive, canola, soy, corn, sunflower and peanut. Check the labels, and avoid partially hydrogenated oil, which have unhealthy trans fats.  If you're thirsty, drink water. Coffee and tea are good in moderation, but skip sugary drinks and limit milk and dairy products to one or two daily servings.  The type of carbohydrate in the diet is more important than the amount. Some sources of carbohydrates, such as vegetables, fruits, whole grains, and beans-are healthier than others.  Finally, stay active.

## 2018-08-12 ENCOUNTER — Telehealth (HOSPITAL_COMMUNITY): Payer: Self-pay | Admitting: Emergency Medicine

## 2018-08-12 LAB — COMPLETE METABOLIC PANEL WITH GFR
AG Ratio: 1.9 (calc) (ref 1.0–2.5)
ALT: 18 U/L (ref 6–29)
AST: 20 U/L (ref 10–35)
Albumin: 4.4 g/dL (ref 3.6–5.1)
Alkaline phosphatase (APISO): 57 U/L (ref 33–130)
BUN: 15 mg/dL (ref 7–25)
CO2: 24 mmol/L (ref 20–32)
Calcium: 9.3 mg/dL (ref 8.6–10.4)
Chloride: 104 mmol/L (ref 98–110)
Creat: 0.99 mg/dL (ref 0.50–0.99)
GFR, Est African American: 68 mL/min/{1.73_m2} (ref >=60)
GFR, Est Non African American: 59 mL/min/{1.73_m2} — ABNORMAL LOW (ref >=60)
Globulin: 2.3 g/dL (calc) (ref 1.9–3.7)
Glucose, Bld: 83 mg/dL (ref 65–99)
Potassium: 4.3 mmol/L (ref 3.5–5.3)
Sodium: 136 mmol/L (ref 135–146)
Total Bilirubin: 0.4 mg/dL (ref 0.2–1.2)
Total Protein: 6.7 g/dL (ref 6.1–8.1)

## 2018-08-12 LAB — URINALYSIS, ROUTINE W REFLEX MICROSCOPIC
Bilirubin Urine: NEGATIVE
Glucose, UA: NEGATIVE
Hgb urine dipstick: NEGATIVE
Ketones, ur: NEGATIVE
Leukocytes, UA: NEGATIVE
Nitrite: NEGATIVE
Protein, ur: NEGATIVE
Specific Gravity, Urine: 1.018 (ref 1.001–1.03)
pH: 6 (ref 5.0–8.0)

## 2018-08-12 LAB — CBC WITH DIFFERENTIAL/PLATELET
Absolute Monocytes: 651 cells/uL (ref 200–950)
Basophils Absolute: 89 cells/uL (ref 0–200)
Basophils Relative: 1.2 %
Eosinophils Absolute: 141 cells/uL (ref 15–500)
Eosinophils Relative: 1.9 %
HCT: 41.9 % (ref 35.0–45.0)
Hemoglobin: 14 g/dL (ref 11.7–15.5)
Lymphs Abs: 2013 cells/uL (ref 850–3900)
MCH: 30.2 pg (ref 27.0–33.0)
MCHC: 33.4 g/dL (ref 32.0–36.0)
MCV: 90.3 fL (ref 80.0–100.0)
MPV: 10.3 fL (ref 7.5–12.5)
Monocytes Relative: 8.8 %
Neutro Abs: 4507 cells/uL (ref 1500–7800)
Neutrophils Relative %: 60.9 %
Platelets: 252 10*3/uL (ref 140–400)
RBC: 4.64 10*6/uL (ref 3.80–5.10)
RDW: 12.9 % (ref 11.0–15.0)
Total Lymphocyte: 27.2 %
WBC: 7.4 10*3/uL (ref 3.8–10.8)

## 2018-08-12 LAB — LIPID PANEL
Cholesterol: 200 mg/dL — ABNORMAL HIGH (ref <200)
HDL: 76 mg/dL (ref >50)
LDL Cholesterol (Calc): 98 mg/dL (calc)
Non-HDL Cholesterol (Calc): 124 mg/dL (calc) (ref <130)
Total CHOL/HDL Ratio: 2.6 (calc) (ref <5.0)
Triglycerides: 154 mg/dL — ABNORMAL HIGH (ref <150)

## 2018-08-12 LAB — HEMOGLOBIN A1C
Hgb A1c MFr Bld: 5.9 % of total Hgb — ABNORMAL HIGH (ref <5.7)
Mean Plasma Glucose: 123 (calc)
eAG (mmol/L): 6.8 (calc)

## 2018-08-12 LAB — TSH: TSH: 2.1 mIU/L (ref 0.40–4.50)

## 2018-08-12 LAB — MICROALBUMIN / CREATININE URINE RATIO
Creatinine, Urine: 129 mg/dL (ref 20–275)
Microalb Creat Ratio: 3 mcg/mg creat (ref <30)
Microalb, Ur: 0.4 mg/dL

## 2018-08-12 LAB — MAGNESIUM: Magnesium: 2.1 mg/dL (ref 1.5–2.5)

## 2018-08-12 LAB — VITAMIN D 25 HYDROXY (VIT D DEFICIENCY, FRACTURES): Vit D, 25-Hydroxy: 56 ng/mL (ref 30–100)

## 2018-08-12 NOTE — Telephone Encounter (Signed)
Pt returning phone call -- reviewed instructions for coronary CT tomorrow, all questions answered  Marchia Bond RN Navigator Cardiac Imaging [facility] [phone] Office  [phone] Cell

## 2018-08-12 NOTE — Telephone Encounter (Signed)
Left message on voicemail with name and callback number Marchia Bond RN Navigator Cardiac Imaging [phone]

## 2018-08-13 ENCOUNTER — Ambulatory Visit (HOSPITAL_COMMUNITY)
Admission: RE | Admit: 2018-08-13 | Discharge: 2018-08-13 | Disposition: A | Discharge status: Home / Self Care | Payer: Medicare Other | Source: Ambulatory Visit | Attending: Cardiovascular Disease | Admitting: Cardiovascular Disease

## 2018-08-13 DIAGNOSIS — I2510 Atherosclerotic heart disease of native coronary artery without angina pectoris: Secondary | ICD-10-CM | POA: Insufficient documentation

## 2018-08-13 MED ORDER — IOPAMIDOL (ISOVUE-370) INJECTION 76%
80.0000 mL | Freq: Once | INTRAVENOUS | Status: AC | PRN
  Administered 2018-08-13: 80 mL via INTRAVENOUS

## 2018-08-13 MED ORDER — NITROGLYCERIN 0.4 MG SL SUBL
0.8000 mg | SUBLINGUAL_TABLET | Freq: Once | SUBLINGUAL | Status: AC
  Administered 2018-08-13: 0.8 mg via SUBLINGUAL
  Filled 2018-08-13: qty 25

## 2018-08-13 MED ORDER — NITROGLYCERIN 0.4 MG SL SUBL
SUBLINGUAL_TABLET | SUBLINGUAL | Status: AC
  Filled 2018-08-13: qty 2

## 2018-08-17 ENCOUNTER — Other Ambulatory Visit: Payer: Medicare Other

## 2018-08-17 ENCOUNTER — Telehealth: Payer: Self-pay | Admitting: Physician Assistant

## 2018-08-17 ENCOUNTER — Ambulatory Visit
Admission: RE | Admit: 2018-08-17 | Discharge: 2018-08-17 | Disposition: A | Discharge status: Home / Self Care | Payer: Medicare Other | Source: Ambulatory Visit | Attending: Physician Assistant | Admitting: Physician Assistant

## 2018-08-17 DIAGNOSIS — R911 Solitary pulmonary nodule: Secondary | ICD-10-CM | POA: Diagnosis not present

## 2018-08-17 DIAGNOSIS — R918 Other nonspecific abnormal finding of lung field: Secondary | ICD-10-CM

## 2018-08-17 NOTE — Telephone Encounter (Signed)
New Message    Returning your phone call from today about CT.

## 2018-08-17 NOTE — Telephone Encounter (Signed)
Patient was given results. No questions, and appointment made.

## 2018-08-19 ENCOUNTER — Telehealth: Payer: Self-pay | Admitting: Physician Assistant

## 2018-08-19 MED ORDER — CIMETIDINE 400 MG PO TABS: 400.0000 mg | ORAL_TABLET | Freq: Two times a day (BID) | ORAL | 2 refills | Status: DC

## 2018-08-19 NOTE — Telephone Encounter (Signed)
-----   Message from Elenor Quinones, Kyle sent at 08/19/2018  9:45 AM EST ----- Regarding: med back order PEPCID 40mg s are on back order w/ no release date per [facility] Please send something NEW

## 2018-08-19 NOTE — Addendum Note (Signed)
Addended by: Vicie Mutters R on: 08/19/2018 01:12 PM   Modules accepted: Orders

## 2018-08-24 ENCOUNTER — Other Ambulatory Visit: Payer: Self-pay | Admitting: Physician Assistant

## 2018-08-24 ENCOUNTER — Other Ambulatory Visit: Payer: Self-pay | Admitting: Internal Medicine

## 2018-08-24 DIAGNOSIS — F17200 Nicotine dependence, unspecified, uncomplicated: Secondary | ICD-10-CM

## 2018-08-24 MED ORDER — CIMETIDINE 400 MG PO TABS: 400.0000 mg | ORAL_TABLET | Freq: Every day | ORAL | 3 refills | Status: DC

## 2018-08-24 MED ORDER — BUPROPION HCL ER (XL) 150 MG PO TB24: ORAL_TABLET | ORAL | 1 refills | Status: DC

## 2018-08-26 ENCOUNTER — Encounter: Payer: Self-pay | Admitting: Cardiovascular Disease

## 2018-08-26 ENCOUNTER — Ambulatory Visit (INDEPENDENT_AMBULATORY_CARE_PROVIDER_SITE_OTHER): Payer: Medicare Other | Admitting: Cardiovascular Disease

## 2018-08-26 VITALS — BP 130/78 | HR 75 | Ht 63.0 in | Wt 180.0 lb

## 2018-08-26 DIAGNOSIS — K760 Fatty (change of) liver, not elsewhere classified: Secondary | ICD-10-CM

## 2018-08-26 DIAGNOSIS — R931 Abnormal findings on diagnostic imaging of heart and coronary circulation: Secondary | ICD-10-CM | POA: Diagnosis not present

## 2018-08-26 DIAGNOSIS — J439 Emphysema, unspecified: Secondary | ICD-10-CM | POA: Diagnosis not present

## 2018-08-26 DIAGNOSIS — I2510 Atherosclerotic heart disease of native coronary artery without angina pectoris: Secondary | ICD-10-CM

## 2018-08-26 DIAGNOSIS — E785 Hyperlipidemia, unspecified: Secondary | ICD-10-CM

## 2018-08-26 DIAGNOSIS — Z720 Tobacco use: Secondary | ICD-10-CM | POA: Diagnosis not present

## 2018-08-26 MED ORDER — METOPROLOL SUCCINATE ER 25 MG PO TB24: 25.0000 mg | ORAL_TABLET | Freq: Every day | ORAL | 1 refills | Status: DC

## 2018-08-26 MED ORDER — ROSUVASTATIN CALCIUM 40 MG PO TABS: 20.0000 mg | ORAL_TABLET | Freq: Every day | ORAL | 1 refills | Status: DC

## 2018-08-26 NOTE — Progress Notes (Signed)
Cardiology Office Note    Date:  08/28/2018   ID:  Karen Barrera, DOB [birth year], MRN [id number]  Pulmonary: Dr. Keturah Barre PCP:  [name], MD  Cardiologist:  Shelva Majestic, MD   f/u cardiology evaluation initially referred through the courtesy of Dr. Keturah Barre for evaluation of coronary calcification.  History of Present Illness:  Karen Barrera is a 68 y.o. female who is the daughter of my patient, Karen Barrera.  She is followed by Dr. Vicente Serene for primary care and Dr. Annamaria Boots for [place].  She was referred for cardiology evaluation after being found to have coronary calcification. I saw her for initial evaluation on July 08, 2018.  She presents for follow-up evaluation.  [name] has a long history of tobacco use.  She started smoking at age 75 and typically has smoked 1 pack/day.  She has smoked for 57 years and stopped during her pregnancies.  She is followed by Dr. Annamaria Boots for allergic rhinitis, recurrent acute bronchitis, as well as lung nodules.  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  She had recently undergone a chest CT which showed a stable left upper lobe nodule adjacent to bulla formation.  It was recommended that she undergo unenhanced chest CT in 12 months.  On her CT she was found to have coronary artery calcifications as well as a stable right hepatic cyst.  She admits to occasional episodes of left chest wall like discomfort.  Occasionally she has noticed some sporadic chest tightness but typically this has not been exertional.  Usually is short-lived and last only 2 to 3 minutes.  In addition to her longstanding smoking history, there is family history for cardiovascular disease.  Remotely, she believes she may have had a stress test in a years ago.  She has a history of a cracked sternum approximately 3 years ago.    When I initially saw her in December 2019 he denied any exertional type of chest pain.  However, with her longstanding tobacco history, and  family history for CAD as well as documented coronary calcification I recommended a CT coronary angiogram as well as a 2D echo Doppler study.  We discussed the importance of smoking cessation.  In addition with her previous coronary calcification LDL cholesterol of 93 I recommended titration of rosuvastatin to 20 mg.  Her echo Doppler study revealed an EF of 55 to 60% with grade 1 diastolic dysfunction.  There is very mild dilation of the aortic root.  There were no valvular abnormalities.  Her CTA was abnormal and showed coronary artery calcium score of 891 placing her in the 97th percentile for age and gender.  She was felt to have possible 70 to 90% stenosis in the proximal LAD and mild stenosis in the proximal circumflex.  FFR analysis was abnormal in the mid LAD at 0.76 suggesting hemodynamically significant proximal LAD stenosis.  Chest CT also revealed an interval increase in soft tissue thickening along the posterior and lateral aspect of the left upper lobe bulla and although this could represent infectious etiology an underlying neoplasm was suspected.  As result she is to undergo subsequent PET imaging.  She again was found to have aortic atherosclerosis, coronary atherosclerosis as well as emphysema.  She was also noted to have hepato-steatosis and probable enlarging posterior right hepatic lobe cyst.  He denies any chest pain.  She presents for follow-up evaluation.  Past Medical History:  Diagnosis Date  . Acute perforated appendicitis 10/31/2014  .  Adenomatous colon polyp   . Allergic rhinitis   . Allergy   . Anemia   . Arthritis    "fingers" (10/31/2014)  . Chronic bronchitis ([place])    "got it q yr til I had my nose OR" (10/31/2014)  . COPD (chronic obstructive pulmonary disease) ([place])   . DDD (degenerative disc disease), cervical   . DDD (degenerative disc disease), lumbar   . Depression    "short term when my sister passed away unexpectedly"  . Family history of adverse reaction to  anesthesia    "mother is hard to wake up; a little goes a long way w/her"  . Family history of breast cancer   . Family history of colon cancer   . Family history of skin cancer   . GERD (gastroesophageal reflux disease)   . History of stomach ulcers   . Hyperlipidemia    "borderline; RX is preventative" (10/31/2014)  . Kidney stones    "they passed"  . Migraine    "used to get them alot; don't get them anymore" (10/31/2014)  . Osteoarthritis of both knees   . Osteopenia   . Pneumonia 2003 X 1  . Pre-diabetes   . Seizures ([place]) ~ 1962 X 1   "from an abscessed wisdom tooth"  . Tobacco dependence   . Vitamin D deficiency     Past Surgical History:  Procedure Laterality Date  . APPENDECTOMY    . ARTHROTOMY Right 01/11/2016   Procedure: DISTAL INTERPHALANGEAL JOINT ARTHROTOMY RIGHT INDEX FINGER;  Surgeon: Leanora Cover, MD;  Location: [facility];  Service: Orthopedics;  Laterality: Right;  . COLONOSCOPY    . CYST EXCISION Right 01/11/2016   Procedure: EXCISION MUCOID CYST;  Surgeon: Leanora Cover, MD;  Location: [facility];  Service: Orthopedics;  Laterality: Right;  . LAPAROSCOPIC APPENDECTOMY N/A 10/31/2014   Procedure: APPENDECTOMY LAPAROSCOPIC;  Surgeon: Alphonsa Overall, MD;  Location: [facility];  Service: General;  Laterality: N/A;  . SALPINGOOPHORECTOMY Right 1999   "w/grapefruit-sized polyp"  . SHOULDER ARTHROSCOPY Right 2003   removal spurs  . SHOULDER ARTHROSCOPY W/ ROTATOR CUFF REPAIR Left 2012  . SINUS EXPLORATION  12/2013   [facility]  . TENDON REPAIR Left 10/2009   torn tendon @ elbow  . VAGINAL HYSTERECTOMY  1978    Current Medications: Outpatient Medications Prior to Visit  Medication Sig Dispense Refill  . albuterol (PROVENTIL HFA;VENTOLIN HFA) 108 (90 Base) MCG/ACT inhaler Use 2 inhalations 5 minutes apart every 4 hours PRN. (Patient taking differently: Inhale 2 puffs into the lungs every 6 (six) hours as needed for wheezing or shortness of breath.  ) 18 g 3  . buPROPion (WELLBUTRIN XL) 150 MG 24 hr tablet Take 1 tablet every morning for Mood (Patient taking differently: Take 150 mg by mouth daily. Take 1 tablet every morning for Mood) 90 tablet 1  . Cholecalciferol (VITAMIN D-3) 5000 UNITS TABS Take 10,000 Units by mouth daily.     . Coenzyme Q10 (COQ10) 200 MG CAPS Take 200 mg by mouth daily.    Marland Kitchen estradiol (ESTRACE) 1 MG tablet Take 1 mg by mouth daily.  3  . montelukast (SINGULAIR) 10 MG tablet TAKE 1 TABLET AT BEDTIME (Patient taking differently: Take 10 mg by mouth daily. ) 90 tablet 1  . sertraline (ZOLOFT) 100 MG tablet Take 1 tablet daily for Mood (Patient taking differently: Take 100 mg by mouth daily. ) 90 tablet 3  . umeclidinium-vilanterol (ANORO ELLIPTA) 62.5-25 MCG/INH AEPB Inhale 1  puff into the lungs daily. 3 each 3  . albuterol (PROAIR HFA) 108 (90 Base) MCG/ACT inhaler Inhale 1 to 2 inhalations 10-15 minutes apart every 4 hours  if needed to rescue asthma 48 g 3  . aspirin 81 MG tablet Take 81 mg by mouth daily.    . B Complex-C (SUPER B COMPLEX PO) Take 1 capsule by mouth daily.     . diclofenac sodium (VOLTAREN) 1 % GEL Apply 2 g topically 4 (four) times daily. (Patient taking differently: Apply 2 g topically 4 (four) times daily as needed (pain.). ) 100 g 3  . famotidine (PEPCID) 40 MG tablet Take 1 tablet (40 mg total) by mouth every evening. (Patient not taking: Reported on 08/27/2018) 90 tablet 3  . Fluticasone-Umeclidin-Vilant (TRELEGY ELLIPTA) 100-62.5-25 MCG/INH AEPB Inhale 1 puff into the lungs daily. (Patient not taking: Reported on 08/27/2018) 2 each 0  . ranitidine (ZANTAC) 150 MG tablet Take 1 tablet 1 or 2 x /day as needed for Acid Indigestion & Reflux 180 tablet 3  . rosuvastatin (CRESTOR) 20 MG tablet Take 1 tablet (20 mg total) by mouth daily. 90 tablet 3  . metoprolol tartrate (LOPRESSOR) 100 MG tablet Take 1 tablet (100 mg total) by mouth once for 1 dose. Take 100 mg (1 tablet) TWO hours prior to CT 1 tablet  0   No facility-administered medications prior to visit.      Allergies:   Chantix [varenicline] and Moxifloxacin hcl in nacl   Social History   Socioeconomic History  . Marital status: Married    Spouse name: Not on file  . Number of children: Not on file  . Years of education: Not on file  . Highest education level: Not on file  Occupational History  . Occupation: Retired  Scientific laboratory technician  . Financial resource strain: Not on file  . Food insecurity:    Worry: Not on file    Inability: Not on file  . Transportation needs:    Medical: Not on file    Non-medical: Not on file  Tobacco Use  . Smoking status: Current Every Day Smoker    Packs/day: 1.00    Years: 25.00    Pack years: 25.00    Types: Cigarettes  . Smokeless tobacco: Never Used  . Tobacco comment: Pt currently smoking .75ppd as of 06/05/17  Substance and Sexual Activity  . Alcohol use: No    Alcohol/week: 0.0 standard drinks  . Drug use: No  . Sexual activity: Not Currently  Lifestyle  . Physical activity:    Days per week: Not on file    Minutes per session: Not on file  . Stress: Not on file  Relationships  . Social connections:    Talks on phone: Not on file    Gets together: Not on file    Attends religious service: Not on file    Active member of club or organization: Not on file    Attends meetings of clubs or organizations: Not on file    Relationship status: Not on file  Other Topics Concern  . Not on file  Social History Narrative  . Not on file    Social history is notable in that she is married for 50 years.  She has 2 children ages 9 and 46.  One child was stillborn at 60 months.  She is retired.  She has a BS in business and marketing.  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  She does not routinely exercise.  Family History:  The patient's family history includes Arthritis in her paternal grandmother; Breast cancer in an other family member; Breast  cancer (age of onset: 57) in an other family member; Cancer in an other family member; Colon cancer in an other family member; Colon cancer (age of onset: 28) in some other family members; Colon polyps in her father; Diabetes in her mother; Hyperlipidemia in her maternal grandfather; Melanoma (age of onset: 79) in her paternal grandmother; Seizures in her sister; Skin cancer (age of onset: 59) in her mother; Stroke in her maternal grandmother.   ROS General: Negative; No fevers, chills, or night sweats;  HEENT: Negative; No changes in vision or hearing, sinus congestion, difficulty swallowing Pulmonary: COPD, lung nodule Cardiovascular: See HPI GI: Negative; No nausea, vomiting, diarrhea, or abdominal pain GU: Negative; No dysuria, hematuria, or difficulty voiding Musculoskeletal: Negative; no myalgias, joint pain, or weakness Hematologic/Oncology: Negative; no easy bruising, bleeding Endocrine: Negative; no heat/cold intolerance; no diabetes Neuro: Negative; no changes in balance, headaches Skin: Negative; No rashes or skin lesions Psychiatric: Negative; No behavioral problems, depression Sleep: Negative; No snoring, daytime sleepiness, hypersomnolence, bruxism, restless legs, hypnogognic hallucinations, no cataplexy Other comprehensive 14 point system review is negative.   PHYSICAL EXAM:   VS:  BP 130/78 (BP Location: Right Arm, Patient Position: Sitting, Cuff Size: Normal)   Pulse 75   Ht '5\' 3"'  (1.6 m)   Wt 180 lb (81.6 kg)   BMI 31.89 kg/m    Repeat blood pressure by me 128/76  Wt Readings from Last 3 Encounters:  08/26/18 180 lb (81.6 kg)  08/11/18 178 lb 12.8 oz (81.1 kg)  07/29/18 179 lb (81.2 kg)    General: Alert, oriented, no distress.  Skin: normal turgor, no rashes, warm and dry HEENT: Normocephalic, atraumatic. Pupils equal round and reactive to light; sclera anicteric; extraocular muscles intact;  Nose without nasal septal hypertrophy Mouth/Parynx benign;  Mallinpatti scale Neck: No JVD, no carotid bruits; normal carotid upstroke Lungs: clear to ausculatation and percussion; no wheezing or rales Chest wall: without tenderness to palpitation Heart: PMI not displaced, RRR, s1 s2 normal, 1/6 systolic murmur, no diastolic murmur, no rubs, gallops, thrills, or heaves Abdomen: soft, nontender; no hepatosplenomehaly, BS+; abdominal aorta nontender and not dilated by palpation. Back: no CVA tenderness Pulses 2+ Musculoskeletal: full range of motion, normal strength, no joint deformities Extremities: no clubbing cyanosis or edema, Homan's sign negative  Neurologic: grossly nonfocal; Cranial nerves grossly wnl Psychologic: Normal mood and affect   Studies/Labs Reviewed:   EKG:  EKG is ordered today.  ECG (independently read by me): Normal sinus rhythm at 75 bpm.  Possible left atrial enlargement.  QTc interval 462 ms.  PR interval 176 ms.  July 15, 2018 ECG (independently read by me): Sinus rhythm at 78 bpm.  No ectopy.  Normal intervals.  Early transition.  Recent Labs: BMP Latest Ref Rng & Units 08/11/2018 07/29/2018 03/17/2018  Glucose 65 - 99 mg/dL 83 89 102(H)  BUN 7 - 25 mg/dL '15 14 13  ' Creatinine 0.50 - 0.99 mg/dL 0.99 0.84 0.88  BUN/Creat Ratio 6 - 22 (calc) NOT APPLICABLE NOT APPLICABLE NOT APPLICABLE  Sodium 710 - 146 mmol/L 136 136 141  Potassium 3.5 - 5.3 mmol/L 4.3 4.7 4.6  Chloride 98 - 110 mmol/L 104 103 108  CO2 20 - 32 mmol/L '24 24 25  ' Calcium 8.6 - 10.4 mg/dL 9.3 9.5 9.2     Hepatic Function Latest Ref  Rng & Units 08/11/2018 03/17/2018 11/10/2017  Total Protein 6.1 - 8.1 g/dL 6.7 6.7 6.5  Albumin 3.6 - 5.1 g/dL - - -  AST 10 - 35 U/L '20 16 18  ' ALT 6 - 29 U/L '18 16 18  ' Alk Phosphatase 33 - 130 U/L - - -  Total Bilirubin 0.2 - 1.2 mg/dL 0.4 0.4 0.4  Bilirubin, Direct 0.0 - 0.2 mg/dL - - 0.1    CBC Latest Ref Rng & Units 08/11/2018 03/17/2018 11/10/2017  WBC 3.8 - 10.8 Thousand/uL 7.4 7.1 7.5  Hemoglobin 11.7 - 15.5 g/dL  14.0 14.6 13.9  Hematocrit 35.0 - 45.0 % 41.9 43.9 40.3  Platelets 140 - 400 Thousand/uL 252 224 213   Lab Results  Component Value Date   MCV 90.3 08/11/2018   MCV 91.3 03/17/2018   MCV 90.0 11/10/2017   Lab Results  Component Value Date   TSH 2.10 08/11/2018   Lab Results  Component Value Date   HGBA1C 5.9 (H) 08/11/2018     BNP No results found for: BNP  ProBNP No results found for: PROBNP   Lipid Panel     Component Value Date/Time   CHOL 200 (H) 08/11/2018 1432   TRIG 154 (H) 08/11/2018 1432   HDL 76 08/11/2018 1432   CHOLHDL 2.6 08/11/2018 1432   VLDL 29 11/12/2016 1730   LDLCALC 98 08/11/2018 1432     RADIOLOGY: Ct Chest Wo Contrast  Result Date: 08/18/2018 CLINICAL DATA:  Left upper lobe lung nodule. Current smoker without history of primary malignancy. EXAM: CT CHEST WITHOUT CONTRAST TECHNIQUE: Multidetector CT imaging of the chest was performed following the standard protocol without IV contrast. COMPARISON:  03/01/2018 diagnostic chest CT. Coronary CTA of 08/13/2018. FINDINGS: Cardiovascular: Aortic and branch vessel atherosclerosis. Normal heart size, without pericardial effusion. Multivessel coronary artery atherosclerosis. Mediastinum/Nodes: No mediastinal or definite hilar adenopathy, given limitations of unenhanced CT. Lungs/Pleura: No pleural fluid. Mild centrilobular emphysema. Minimal posterior left apical nodularity including at on the order of 3 mm superiorly and 3 mm inferiorly on images 20-22/series 8, similar. Again identified is a soft tissue density nodule along the posterolateral aspect of the left upper lobe bulla. Especially when compared back to 06/11/2017, there is increased nodularity. For example, today measuresto 10 x 7 mm on image 48/8. On the order of 9 x 4 mm on 06/11/2017 (when remeasured). Similarly, soft tissue density measures up to 12 by 9 mm on sagittal image 135 today versus 10 x 4 mm on 06/11/2017 (when remeasured). Upper Abdomen:  Moderate hepatic steatosis with sparing adjacent the gallbladder. 5.6 cm hypoattenuating posterior right hepatic lobe lesion is fluid density but has enlarged from 4.0 cm on 06/11/2017. This measured 2.6 cm on 10/31/2014 and did not have worrisome features on that post-contrast abdominal CT. Normal imaged portions of the spleen, stomach, pancreas, kidneys. Abdominal aortic atherosclerosis. Bilateral adrenal nodularity is low-density, favoring small adenomas. Musculoskeletal: Lower cervical spondylosis. Remote fracture of the sternal body. IMPRESSION: 1. Especially when compared to 06/11/2017, interval increase in soft tissue thickening along the periphery (primarily posterior and lateral) of a left upper lobe bulla. Although this could represent superinfection, an underlying neoplasm is suspected. Consider further evaluation with PET. 2. No evidence of thoracic adenopathy. 3. Aortic atherosclerosis (ICD10-I70.0), coronary artery atherosclerosis and emphysema (ICD10-J43.9). 4. Hepatic steatosis. 5. Enlarging posterior right hepatic lobe lesion which is favored to represent a cyst, especially given presence back on 10/31/2014. Recommend attention on follow-up. These results will be called to the  ordering clinician or representative by the Radiologist Assistant, and communication documented in the PACS or zVision Dashboard. Electronically Signed   By: Abigail Miyamoto M.D.   On: 08/18/2018 07:28   Ct Coronary Morph W/cta Cor W/score W/ca W/cm &/or Wo/cm  Addendum Date: 08/13/2018   ADDENDUM REPORT: 08/13/2018 17:53 CLINICAL DATA:  Chest pain EXAM: Cardiac CTA MEDICATIONS: Sub lingual nitro. 44m x 2 TECHNIQUE: The patient was scanned on a Siemens 1696slice scanner. Gantry rotation speed was 250 msecs. Collimation was 0.6 mm. A 100 kV prospective scan was triggered in the ascending thoracic aorta at 35-75% of the R-R interval. Average HR during the scan was 60 bpm. The 3D data set was interpreted on a dedicated work  station using MPR, MIP and VRT modes. A total of 80cc of contrast was used. FINDINGS: Non-cardiac: [facility]. Pulmonary veins drain normally to the left atrium. Calcium Score: 891 Agatston units. Coronary Arteries: Right dominant with no anomalies LM: Calcified plaque without significant stenosis. LAD system: Extensive mixed plaque in the proximal LAD, possible severe (70-90%) stenosis. Circumflex system: Extensive mixed plaque in the proximal LCx, suspect mild (<50%) stenosis. RCA system: Calcified plaque proximal RCA without significant stenosis. IMPRESSION: 1. Coronary artery calcium score 891 Agatston units. This places the patient in the 97th percentile for age and gender, suggesting high risk for future cardiac events. 2. Possible severe (70-90%) stenosis in the proximal LAD. Suspect mild stenosis in the proximal LCx. Dalton Mclean Electronically Signed   By: DLoralie ChampagneM.D.   On: 08/13/2018 17:53   Result Date: 08/13/2018 EXAM: OVER-READ INTERPRETATION  CT CHEST The following report is an over-read performed by radiologist Dr. KRolm Baptiseof [facility], [place] 08/13/2018. This over-read does not include interpretation of cardiac or coronary anatomy or pathology. The coronary CTA interpretation by the cardiologist is attached. COMPARISON:  03/01/2018 FINDINGS: Vascular: Heart is normal size. Visualized aorta normal caliber. Scattered aortic calcifications in the visualized descending thoracic aorta. Mediastinum/Nodes: No adenopathy in the lower mediastinum or hila. Lungs/Pleura: No confluent opacities or effusions. Upper Abdomen: Imaging into the upper abdomen shows no acute findings. Musculoskeletal: Chest wall soft tissues are unremarkable. No acute bony abnormality. IMPRESSION: No acute or significant extracardiac abnormality. Electronically Signed: By: KRolm BaptiseM.D. On: 08/13/2018 12:02   Ct Coronary Fractional Flow Reserve Fluid Analysis  Result  Date: 08/16/2018 CLINICAL DATA:  Chest pain EXAM: CT FFR MEDICATIONS: No additional medications. TECHNIQUE: The coronary CTA was sent for FFR. FINDINGS: FFR 0.76 mid LAD. IMPRESSION: Hemodynamically significant proximal LAD stenosis. Dalton Mclean Electronically Signed   By: DLoralie ChampagneM.D.   On: 08/16/2018 17:01     Additional studies/ records that were reviewed today include:  Reviewed the records of Dr. YAnnamaria Boots her spirometry, chest CT and laboratory.  ASSESSMENT:    1. Abnormal screening cardiac CT   2. Coronary artery calcification seen on CT scan   3. Hyperlipidemia with target LDL less than 70   4. Tobacco abuse   5. Pulmonary emphysema, unspecified emphysema type ([place]   6. Hepatic steatosis     PLAN:  Ms. DSarit Sparanois a 68year old female who has a 47-year history of tobacco use, having started at age 68  She has a history of surgical rhinitis, recurrent acute bronchitis, mixed COPD and has been on Singulair, pro-air, and Anora Ellipta.  She has had intermittent episodes of nonradiating short-lived chest discomfort, not typically exertionally precipitated.  A chest CT from  August 2019 revealed stable left upper lobe nodule but she was noted to have coronary artery calcification suggesting CAD.  When I initially saw her, I titrated her rosuvastatin to 20 mg in light of her LDL of 93.  Her echo Doppler study reveals normal systolic function with grade 1 diastolic dysfunction.  Due to her coronary calcification and significant tobacco history I have scheduled her for coronary CTA.  I had an extensive discussion with her and her husband today reviewing the findings.  Calcium score is significantly increased at 91 placing her in the 97th percentile for age and gender.  She was found to have probable 70 to 90% severe stenosis in the proximal LAD and on on FFR assessment this was felt to be hemodynamically significant at 0.76.  I had a lengthy discussion with her in the office today.  I  have recommended she undergo definitive cardiac catheterization. The risks and benefits of a cardiac catheterization including, but not limited to, death, stroke, MI, kidney damage and bleeding were discussed with the patient who indicates understanding and agrees to proceed.  In light of her significant coronary calcification I have recommended further titration of rosuvastatin to 40 mg and discussed with her potential for coronary regression of LDL cholesterol is 53 or below.  I am adding Toprol-XL 25 mg to her medical regimen.  I will schedule her to undergo the catheterization procedure at my next available cath day is September 06, 2018.  I again discussed the importance of complete smoking cessation.  I reviewed her chest CT and she is scheduled to undergo PET scan imaging next week for further evaluation of her progressive increase in soft tissue thickening along her posterior lateral left upper lobe bulla.  We also discussed her hepatic steatosis.  In January triglycerides were elevated at 154.   Medication Adjustments/Labs and Tests Ordered: Current medicines are reviewed at length with the patient today.  Concerns regarding medicines are outlined above.  Medication changes, Labs and Tests ordered today are listed in the Patient Instructions below. Patient Instructions  Medication Instructions:  Increase Rosuvastatin 40 mg daily Start Metoprolol XL 25 mg daily   If you need a refill on your cardiac medications before your next appointment, please call your pharmacy.   Lab work: We will contact about pre cath lab work If you have labs (blood work) drawn today and your tests are completely normal, you will receive your results only by: Marland Kitchen MyChart Message (if you have MyChart) OR . A paper copy in the mail If you have any lab test that is abnormal or we need to change your treatment, we will call you to review the results.  Testing/Procedures: Your physician has requested that you have a  cardiac catheterization. Cardiac catheterization is used to diagnose and/or treat various heart conditions. Doctors may recommend this procedure for a number of different reasons. The most common reason is to evaluate chest pain. Chest pain can be a symptom of coronary artery disease (CAD), and cardiac catheterization can show whether plaque is narrowing or blocking your heart's arteries. This procedure is also used to evaluate the valves, as well as measure the blood flow and oxygen levels in different parts of your heart. For further information please visit [email or web address]. Please follow instruction sheet, as given.    Follow-Up: At [facility] you and your health needs are our priority.  As part of our continuing mission to provide you with exceptional heart care, we have created designated Provider Care  Teams.  These Care Teams include your primary Cardiologist (physician) and Advanced Practice Providers (APPs -  Physician Assistants and Nurse Practitioners) who all work together to provide you with the care you need, when you need it. You will need a follow up appointment after Cath with Dr.Kelly one of the following Advanced Practice Providers on your designated Care Team: Almyra Deforest, [place] . Fabian Sharp, PA-C  Any Other Special Instructions Will Be Listed Below (If Applicable).        [facility] [address] [place] [address] [place] [address] Dept: [phone] Loc: [place]  08/26/2018  1. Please arrive at the [facility] (Main Entrance A) at [facility]: [address] [place], [place] [address] ( two hours before your procedure to ensure your preparation). Free valet parking service is available.   Special note: Every effort is made to have your procedure done on time. Please understand that emergencies sometimes delay scheduled procedures.  2. Diet: Do not eat solid  foods after midnight.  The patient may have clear liquids until 5am upon the day of the procedure.  3. Labs: We will call you regarding this.  4. Medication instructions in preparation for your procedure:  On the morning of your procedure, take your Aspirin and any morning medicines NOT listed above.  You may use sips of water.  5. Plan for one night stay--bring personal belongings. 6. Bring a current list of your medications and current insurance cards. 7. You MUST have a responsible person to drive you home. 8. Someone MUST be with you the first 24 hours after you arrive home or your discharge will be delayed. 9. Please wear clothes that are easy to get on and off and wear slip-on shoes.  Thank you for allowing [place] to care for you!   -- [place]       Signed, Shelva Majestic, MD  08/28/2018 11:41 AM    [facility] [address], [place], [place], [place]  [address] Phone: [phone]

## 2018-08-26 NOTE — Patient Instructions (Signed)
Medication Instructions:  Increase Rosuvastatin 40 mg daily Start Metoprolol XL 25 mg daily   If you need a refill on your cardiac medications before your next appointment, please call your pharmacy.   Lab work: We will contact about pre cath lab work If you have labs (blood work) drawn today and your tests are completely normal, you will receive your results only by: Marland Kitchen MyChart Message (if you have MyChart) OR . A paper copy in the mail If you have any lab test that is abnormal or we need to change your treatment, we will call you to review the results.  Testing/Procedures: Your physician has requested that you have a cardiac catheterization. Cardiac catheterization is used to diagnose and/or treat various heart conditions. Doctors may recommend this procedure for a number of different reasons. The most common reason is to evaluate chest pain. Chest pain can be a symptom of coronary artery disease (CAD), and cardiac catheterization can show whether plaque is narrowing or blocking your heart's arteries. This procedure is also used to evaluate the valves, as well as measure the blood flow and oxygen levels in different parts of your heart. For further information please visit [email or web address]. Please follow instruction sheet, as given.    Follow-Up: At [facility], you and your health needs are our priority.  As part of our continuing mission to provide you with exceptional heart care, we have created designated Provider Care Teams.  These Care Teams include your primary Cardiologist (physician) and Advanced Practice Providers (APPs -  Physician Assistants and Nurse Practitioners) who all work together to provide you with the care you need, when you need it. You will need a follow up appointment after Cath with Dr.Kelly one of the following Advanced Practice Providers on your designated Care Team: Almyra Deforest, [place] . Fabian Sharp, PA-C  Any Other Special Instructions Will Be Listed Below (If  Applicable).        [facility] [address] [place] [address] [place] [address] Dept: [phone] Loc: [place]  08/26/2018  1. Please arrive at the [facility] (Main Entrance A) at [facility]: [address] [place], [place] [address] ( two hours before your procedure to ensure your preparation). Free valet parking service is available.   Special note: Every effort is made to have your procedure done on time. Please understand that emergencies sometimes delay scheduled procedures.  2. Diet: Do not eat solid foods after midnight.  The patient may have clear liquids until 5am upon the day of the procedure.  3. Labs: We will call you regarding this.  4. Medication instructions in preparation for your procedure:  On the morning of your procedure, take your Aspirin and any morning medicines NOT listed above.  You may use sips of water.  5. Plan for one night stay--bring personal belongings. 6. Bring a current list of your medications and current insurance cards. 7. You MUST have a responsible person to drive you home. 8. Someone MUST be with you the first 24 hours after you arrive home or your discharge will be delayed. 9. Please wear clothes that are easy to get on and off and wear slip-on shoes.  Thank you for allowing [place] to care for you!   -- [place] [facility]

## 2018-08-26 NOTE — H&P (View-Only) (Signed)
Cardiology Office Note    Date:  08/28/2018   ID:  Karen Barrera, DOB 18-Aug-1950, MRN [id number]  Pulmonary: Dr. Keturah Barre PCP:  [name], MD  Cardiologist:  Shelva Majestic, MD   f/u cardiology evaluation initially referred through the courtesy of Dr. Keturah Barre for evaluation of coronary calcification.  History of Present Illness:  Karen Barrera is a 68 y.o. female who is the daughter of my patient, Karen Barrera.  She is followed by Dr. Vicente Serene for primary care and Dr. Annamaria Boots for [place].  She was referred for cardiology evaluation after being found to have coronary calcification. I saw her for initial evaluation on July 08, 2018.  She presents for follow-up evaluation.  Karen Barrera has a long history of tobacco use.  She started smoking at age 36 and typically has smoked 1 pack/day.  She has smoked for 57 years and stopped during her pregnancies.  She is followed by Dr. Annamaria Boots for allergic rhinitis, recurrent acute bronchitis, as well as lung nodules.  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  She had recently undergone a chest CT which showed a stable left upper lobe nodule adjacent to bulla formation.  It was recommended that she undergo unenhanced chest CT in 12 months.  On her CT she was found to have coronary artery calcifications as well as a stable right hepatic cyst.  She admits to occasional episodes of left chest wall like discomfort.  Occasionally she has noticed some sporadic chest tightness but typically this has not been exertional.  Usually is short-lived and last only 2 to 3 minutes.  In addition to her longstanding smoking history, there is family history for cardiovascular disease.  Remotely, she believes she may have had a stress test in a years ago.  She has a history of a cracked sternum approximately 3 years ago.    When I initially saw her in December 2019 he denied any exertional type of chest pain.  However, with her longstanding tobacco history, and  family history for CAD as well as documented coronary calcification I recommended a CT coronary angiogram as well as a 2D echo Doppler study.  We discussed the importance of smoking cessation.  In addition with her previous coronary calcification LDL cholesterol of 93 I recommended titration of rosuvastatin to 20 mg.  Her echo Doppler study revealed an EF of 55 to 60% with grade 1 diastolic dysfunction.  There is very mild dilation of the aortic root.  There were no valvular abnormalities.  Her CTA was abnormal and showed coronary artery calcium score of 891 placing her in the 97th percentile for age and gender.  She was felt to have possible 70 to 90% stenosis in the proximal LAD and mild stenosis in the proximal circumflex.  FFR analysis was abnormal in the mid LAD at 0.76 suggesting hemodynamically significant proximal LAD stenosis.  Chest CT also revealed an interval increase in soft tissue thickening along the posterior and lateral aspect of the left upper lobe bulla and although this could represent infectious etiology an underlying neoplasm was suspected.  As result she is to undergo subsequent PET imaging.  She again was found to have aortic atherosclerosis, coronary atherosclerosis as well as emphysema.  She was also noted to have hepato-steatosis and probable enlarging posterior right hepatic lobe cyst.  He denies any chest pain.  She presents for follow-up evaluation.  Past Medical History:  Diagnosis Date  . Acute perforated appendicitis 10/31/2014  .  Adenomatous colon polyp   . Allergic rhinitis   . Allergy   . Anemia   . Arthritis    "fingers" (10/31/2014)  . Chronic bronchitis ([place])    "got it q yr til I had my nose OR" (10/31/2014)  . COPD (chronic obstructive pulmonary disease) ([place])   . DDD (degenerative disc disease), cervical   . DDD (degenerative disc disease), lumbar   . Depression    "short term when my sister passed away unexpectedly"  . Family history of adverse reaction to  anesthesia    "mother is hard to wake up; a little goes a long way w/her"  . Family history of breast cancer   . Family history of colon cancer   . Family history of skin cancer   . GERD (gastroesophageal reflux disease)   . History of stomach ulcers   . Hyperlipidemia    "borderline; RX is preventative" (10/31/2014)  . Kidney stones    "they passed"  . Migraine    "used to get them alot; don't get them anymore" (10/31/2014)  . Osteoarthritis of both knees   . Osteopenia   . Pneumonia 2003 X 1  . Pre-diabetes   . Seizures ([place]) ~ 1962 X 1   "from an abscessed wisdom tooth"  . Tobacco dependence   . Vitamin D deficiency     Past Surgical History:  Procedure Laterality Date  . APPENDECTOMY    . ARTHROTOMY Right 01/11/2016   Procedure: DISTAL INTERPHALANGEAL JOINT ARTHROTOMY RIGHT INDEX FINGER;  Surgeon: Leanora Cover, MD;  Location: [facility];  Service: Orthopedics;  Laterality: Right;  . COLONOSCOPY    . CYST EXCISION Right 01/11/2016   Procedure: EXCISION MUCOID CYST;  Surgeon: Leanora Cover, MD;  Location: [facility];  Service: Orthopedics;  Laterality: Right;  . LAPAROSCOPIC APPENDECTOMY N/A 10/31/2014   Procedure: APPENDECTOMY LAPAROSCOPIC;  Surgeon: Alphonsa Overall, MD;  Location: [facility];  Service: General;  Laterality: N/A;  . SALPINGOOPHORECTOMY Right 1999   "w/grapefruit-sized polyp"  . SHOULDER ARTHROSCOPY Right 2003   removal spurs  . SHOULDER ARTHROSCOPY W/ ROTATOR CUFF REPAIR Left 2012  . SINUS EXPLORATION  12/2013   [place]  . TENDON REPAIR Left 10/2009   torn tendon @ elbow  . VAGINAL HYSTERECTOMY  1978    Current Medications: Outpatient Medications Prior to Visit  Medication Sig Dispense Refill  . albuterol (PROVENTIL HFA;VENTOLIN HFA) 108 (90 Base) MCG/ACT inhaler Use 2 inhalations 5 minutes apart every 4 hours PRN. (Patient taking differently: Inhale 2 puffs into the lungs every 6 (six) hours as needed for wheezing or shortness of breath.  ) 18 g 3  . buPROPion (WELLBUTRIN XL) 150 MG 24 hr tablet Take 1 tablet every morning for Mood (Patient taking differently: Take 150 mg by mouth daily. Take 1 tablet every morning for Mood) 90 tablet 1  . Cholecalciferol (VITAMIN D-3) 5000 UNITS TABS Take 10,000 Units by mouth daily.     . Coenzyme Q10 (COQ10) 200 MG CAPS Take 200 mg by mouth daily.    Marland Kitchen estradiol (ESTRACE) 1 MG tablet Take 1 mg by mouth daily.  3  . montelukast (SINGULAIR) 10 MG tablet TAKE 1 TABLET AT BEDTIME (Patient taking differently: Take 10 mg by mouth daily. ) 90 tablet 1  . sertraline (ZOLOFT) 100 MG tablet Take 1 tablet daily for Mood (Patient taking differently: Take 100 mg by mouth daily. ) 90 tablet 3  . umeclidinium-vilanterol (ANORO ELLIPTA) 62.5-25 MCG/INH AEPB Inhale 1  puff into the lungs daily. 3 each 3  . albuterol (PROAIR HFA) 108 (90 Base) MCG/ACT inhaler Inhale 1 to 2 inhalations 10-15 minutes apart every 4 hours  if needed to rescue asthma 48 g 3  . aspirin 81 MG tablet Take 81 mg by mouth daily.    . B Complex-C (SUPER B COMPLEX PO) Take 1 capsule by mouth daily.     . diclofenac sodium (VOLTAREN) 1 % GEL Apply 2 g topically 4 (four) times daily. (Patient taking differently: Apply 2 g topically 4 (four) times daily as needed (pain.). ) 100 g 3  . famotidine (PEPCID) 40 MG tablet Take 1 tablet (40 mg total) by mouth every evening. (Patient not taking: Reported on 08/27/2018) 90 tablet 3  . Fluticasone-Umeclidin-Vilant (TRELEGY ELLIPTA) 100-62.5-25 MCG/INH AEPB Inhale 1 puff into the lungs daily. (Patient not taking: Reported on 08/27/2018) 2 each 0  . ranitidine (ZANTAC) 150 MG tablet Take 1 tablet 1 or 2 x /day as needed for Acid Indigestion & Reflux 180 tablet 3  . rosuvastatin (CRESTOR) 20 MG tablet Take 1 tablet (20 mg total) by mouth daily. 90 tablet 3  . metoprolol tartrate (LOPRESSOR) 100 MG tablet Take 1 tablet (100 mg total) by mouth once for 1 dose. Take 100 mg (1 tablet) TWO hours prior to CT 1 tablet  0   No facility-administered medications prior to visit.      Allergies:   Chantix [varenicline] and Moxifloxacin hcl in nacl   Social History   Socioeconomic History  . Marital status: Married    Spouse name: Not on file  . Number of children: Not on file  . Years of education: Not on file  . Highest education level: Not on file  Occupational History  . Occupation: Retired  Scientific laboratory technician  . Financial resource strain: Not on file  . Food insecurity:    Worry: Not on file    Inability: Not on file  . Transportation needs:    Medical: Not on file    Non-medical: Not on file  Tobacco Use  . Smoking status: Current Every Day Smoker    Packs/day: 1.00    Years: 25.00    Pack years: 25.00    Types: Cigarettes  . Smokeless tobacco: Never Used  . Tobacco comment: Pt currently smoking .75ppd as of 06/05/17  Substance and Sexual Activity  . Alcohol use: No    Alcohol/week: 0.0 standard drinks  . Drug use: No  . Sexual activity: Not Currently  Lifestyle  . Physical activity:    Days per week: Not on file    Minutes per session: Not on file  . Stress: Not on file  Relationships  . Social connections:    Talks on phone: Not on file    Gets together: Not on file    Attends religious service: Not on file    Active member of club or organization: Not on file    Attends meetings of clubs or organizations: Not on file    Relationship status: Not on file  Other Topics Concern  . Not on file  Social History Narrative  . Not on file    Social history is notable in that she is married for 50 years.  She has 2 children ages 6 and 29.  One child was stillborn at 2 months.  She is retired.  She has a BS in business and marketing.  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  She does not routinely exercise.  Family History:  The patient's family history includes Arthritis in her paternal grandmother; Breast cancer in an other family member; Breast  cancer (age of onset: 44) in an other family member; Cancer in an other family member; Colon cancer in an other family member; Colon cancer (age of onset: 82) in some other family members; Colon polyps in her father; Diabetes in her mother; Hyperlipidemia in her maternal grandfather; Melanoma (age of onset: 76) in her paternal grandmother; Seizures in her sister; Skin cancer (age of onset: 70) in her mother; Stroke in her maternal grandmother.   ROS General: Negative; No fevers, chills, or night sweats;  HEENT: Negative; No changes in vision or hearing, sinus congestion, difficulty swallowing Pulmonary: COPD, lung nodule Cardiovascular: See HPI GI: Negative; No nausea, vomiting, diarrhea, or abdominal pain GU: Negative; No dysuria, hematuria, or difficulty voiding Musculoskeletal: Negative; no myalgias, joint pain, or weakness Hematologic/Oncology: Negative; no easy bruising, bleeding Endocrine: Negative; no heat/cold intolerance; no diabetes Neuro: Negative; no changes in balance, headaches Skin: Negative; No rashes or skin lesions Psychiatric: Negative; No behavioral problems, depression Sleep: Negative; No snoring, daytime sleepiness, hypersomnolence, bruxism, restless legs, hypnogognic hallucinations, no cataplexy Other comprehensive 14 point system review is negative.   PHYSICAL EXAM:   VS:  BP 130/78 (BP Location: Right Arm, Patient Position: Sitting, Cuff Size: Normal)   Pulse 75   Ht '5\' 3"'  (1.6 m)   Wt 180 lb (81.6 kg)   BMI 31.89 kg/m    Repeat blood pressure by me 128/76  Wt Readings from Last 3 Encounters:  08/26/18 180 lb (81.6 kg)  08/11/18 178 lb 12.8 oz (81.1 kg)  07/29/18 179 lb (81.2 kg)    General: Alert, oriented, no distress.  Skin: normal turgor, no rashes, warm and dry HEENT: Normocephalic, atraumatic. Pupils equal round and reactive to light; sclera anicteric; extraocular muscles intact;  Nose without nasal septal hypertrophy Mouth/Parynx benign;  Mallinpatti scale Neck: No JVD, no carotid bruits; normal carotid upstroke Lungs: clear to ausculatation and percussion; no wheezing or rales Chest wall: without tenderness to palpitation Heart: PMI not displaced, RRR, s1 s2 normal, 1/6 systolic murmur, no diastolic murmur, no rubs, gallops, thrills, or heaves Abdomen: soft, nontender; no hepatosplenomehaly, BS+; abdominal aorta nontender and not dilated by palpation. Back: no CVA tenderness Pulses 2+ Musculoskeletal: full range of motion, normal strength, no joint deformities Extremities: no clubbing cyanosis or edema, Homan's sign negative  Neurologic: grossly nonfocal; Cranial nerves grossly wnl Psychologic: Normal mood and affect   Studies/Labs Reviewed:   EKG:  EKG is ordered today.  ECG (independently read by me): Normal sinus rhythm at 75 bpm.  Possible left atrial enlargement.  QTc interval 462 ms.  PR interval 176 ms.  July 15, 2018 ECG (independently read by me): Sinus rhythm at 78 bpm.  No ectopy.  Normal intervals.  Early transition.  Recent Labs: BMP Latest Ref Rng & Units 08/11/2018 07/29/2018 03/17/2018  Glucose 65 - 99 mg/dL 83 89 102(H)  BUN 7 - 25 mg/dL '15 14 13  ' Creatinine 0.50 - 0.99 mg/dL 0.99 0.84 0.88  BUN/Creat Ratio 6 - 22 (calc) NOT APPLICABLE NOT APPLICABLE NOT APPLICABLE  Sodium 681 - 146 mmol/L 136 136 141  Potassium 3.5 - 5.3 mmol/L 4.3 4.7 4.6  Chloride 98 - 110 mmol/L 104 103 108  CO2 20 - 32 mmol/L '24 24 25  ' Calcium 8.6 - 10.4 mg/dL 9.3 9.5 9.2     Hepatic Function Latest Ref  Rng & Units 08/11/2018 03/17/2018 11/10/2017  Total Protein 6.1 - 8.1 g/dL 6.7 6.7 6.5  Albumin 3.6 - 5.1 g/dL - - -  AST 10 - 35 U/L '20 16 18  ' ALT 6 - 29 U/L '18 16 18  ' Alk Phosphatase 33 - 130 U/L - - -  Total Bilirubin 0.2 - 1.2 mg/dL 0.4 0.4 0.4  Bilirubin, Direct 0.0 - 0.2 mg/dL - - 0.1    CBC Latest Ref Rng & Units 08/11/2018 03/17/2018 11/10/2017  WBC 3.8 - 10.8 Thousand/uL 7.4 7.1 7.5  Hemoglobin 11.7 - 15.5 g/dL  14.0 14.6 13.9  Hematocrit 35.0 - 45.0 % 41.9 43.9 40.3  Platelets 140 - 400 Thousand/uL 252 224 213   Lab Results  Component Value Date   MCV 90.3 08/11/2018   MCV 91.3 03/17/2018   MCV 90.0 11/10/2017   Lab Results  Component Value Date   TSH 2.10 08/11/2018   Lab Results  Component Value Date   HGBA1C 5.9 (H) 08/11/2018     BNP No results found for: BNP  ProBNP No results found for: PROBNP   Lipid Panel     Component Value Date/Time   CHOL 200 (H) 08/11/2018 1432   TRIG 154 (H) 08/11/2018 1432   HDL 76 08/11/2018 1432   CHOLHDL 2.6 08/11/2018 1432   VLDL 29 11/12/2016 1730   LDLCALC 98 08/11/2018 1432     RADIOLOGY: Ct Chest Wo Contrast  Result Date: 08/18/2018 CLINICAL DATA:  Left upper lobe lung nodule. Current smoker without history of primary malignancy. EXAM: CT CHEST WITHOUT CONTRAST TECHNIQUE: Multidetector CT imaging of the chest was performed following the standard protocol without IV contrast. COMPARISON:  03/01/2018 diagnostic chest CT. Coronary CTA of 08/13/2018. FINDINGS: Cardiovascular: Aortic and branch vessel atherosclerosis. Normal heart size, without pericardial effusion. Multivessel coronary artery atherosclerosis. Mediastinum/Nodes: No mediastinal or definite hilar adenopathy, given limitations of unenhanced CT. Lungs/Pleura: No pleural fluid. Mild centrilobular emphysema. Minimal posterior left apical nodularity including at on the order of 3 mm superiorly and 3 mm inferiorly on images 20-22/series 8, similar. Again identified is a soft tissue density nodule along the posterolateral aspect of the left upper lobe bulla. Especially when compared back to 06/11/2017, there is increased nodularity. For example, today measuresto 10 x 7 mm on image 48/8. On the order of 9 x 4 mm on 06/11/2017 (when remeasured). Similarly, soft tissue density measures up to 12 by 9 mm on sagittal image 135 today versus 10 x 4 mm on 06/11/2017 (when remeasured). Upper Abdomen:  Moderate hepatic steatosis with sparing adjacent the gallbladder. 5.6 cm hypoattenuating posterior right hepatic lobe lesion is fluid density but has enlarged from 4.0 cm on 06/11/2017. This measured 2.6 cm on 10/31/2014 and did not have worrisome features on that post-contrast abdominal CT. Normal imaged portions of the spleen, stomach, pancreas, kidneys. Abdominal aortic atherosclerosis. Bilateral adrenal nodularity is low-density, favoring small adenomas. Musculoskeletal: Lower cervical spondylosis. Remote fracture of the sternal body. IMPRESSION: 1. Especially when compared to 06/11/2017, interval increase in soft tissue thickening along the periphery (primarily posterior and lateral) of a left upper lobe bulla. Although this could represent superinfection, an underlying neoplasm is suspected. Consider further evaluation with PET. 2. No evidence of thoracic adenopathy. 3. Aortic atherosclerosis (ICD10-I70.0), coronary artery atherosclerosis and emphysema (ICD10-J43.9). 4. Hepatic steatosis. 5. Enlarging posterior right hepatic lobe lesion which is favored to represent a cyst, especially given presence back on 10/31/2014. Recommend attention on follow-up. These results will be called to the  ordering clinician or representative by the Radiologist Assistant, and communication documented in the PACS or zVision Dashboard. Electronically Signed   By: Abigail Miyamoto M.D.   On: 08/18/2018 07:28   Ct Coronary Morph W/cta Cor W/score W/ca W/cm &/or Wo/cm  Addendum Date: 08/13/2018   ADDENDUM REPORT: 08/13/2018 17:53 CLINICAL DATA:  Chest pain EXAM: Cardiac CTA MEDICATIONS: Sub lingual nitro. 45m x 2 TECHNIQUE: The patient was scanned on a Siemens 1295slice scanner. Gantry rotation speed was 250 msecs. Collimation was 0.6 mm. A 100 kV prospective scan was triggered in the ascending thoracic aorta at 35-75% of the R-R interval. Average HR during the scan was 60 bpm. The 3D data set was interpreted on a dedicated work  station using MPR, MIP and VRT modes. A total of 80cc of contrast was used. FINDINGS: [facility]-cardiac: [facility]. Pulmonary veins drain normally to the left atrium. Calcium Score: 891 Agatston units. Coronary Arteries: Right dominant with no anomalies LM: Calcified plaque without significant stenosis. LAD system: Extensive mixed plaque in the proximal LAD, possible severe (70-90%) stenosis. Circumflex system: Extensive mixed plaque in the proximal LCx, suspect mild (<50%) stenosis. RCA system: Calcified plaque proximal RCA without significant stenosis. IMPRESSION: 1. Coronary artery calcium score 891 Agatston units. This places the patient in the 97th percentile for age and gender, suggesting high risk for future cardiac events. 2. Possible severe (70-90%) stenosis in the proximal LAD. Suspect mild stenosis in the proximal LCx. Dalton Mclean Electronically Signed   By: DLoralie ChampagneM.D.   On: 08/13/2018 17:53   Result Date: 08/13/2018 EXAM: OVER-READ INTERPRETATION  CT CHEST The following report is an over-read performed by radiologist Dr. KRolm Baptiseof [facility] [place]on 08/13/2018. This over-read does not include interpretation of cardiac or coronary anatomy or pathology. The coronary CTA interpretation by the cardiologist is attached. COMPARISON:  03/01/2018 FINDINGS: Vascular: Heart is normal size. Visualized aorta normal caliber. Scattered aortic calcifications in the visualized descending thoracic aorta. Mediastinum/Nodes: No adenopathy in the lower mediastinum or hila. Lungs/Pleura: No confluent opacities or effusions. Upper Abdomen: Imaging into the upper abdomen shows no acute findings. Musculoskeletal: Chest wall soft tissues are unremarkable. No acute bony abnormality. IMPRESSION: No acute or significant extracardiac abnormality. Electronically Signed: By: KRolm BaptiseM.D. On: 08/13/2018 12:02   Ct Coronary Fractional Flow Reserve Fluid Analysis  Result  Date: 08/16/2018 CLINICAL DATA:  Chest pain EXAM: CT FFR MEDICATIONS: No additional medications. TECHNIQUE: The coronary CTA was sent for FFR. FINDINGS: FFR 0.76 mid LAD. IMPRESSION: Hemodynamically significant proximal LAD stenosis. Dalton Mclean Electronically Signed   By: DLoralie ChampagneM.D.   On: 08/16/2018 17:01     Additional studies/ records that were reviewed today include:  Reviewed the records of Dr. YAnnamaria Boots her spirometry, chest CT and laboratory.  ASSESSMENT:    1. Abnormal screening cardiac CT   2. Coronary artery calcification seen on CT scan   3. Hyperlipidemia with target LDL less than 70   4. Tobacco abuse   5. Pulmonary emphysema, unspecified emphysema type ([place]   6. Hepatic steatosis     PLAN:  Ms. DBoluwatife Mutchleris a 68year old female who has a 47-year history of tobacco use, having started at age 68  She has a history of surgical rhinitis, recurrent acute bronchitis, mixed COPD and has been on Singulair, pro-air, and Anora Ellipta.  She has had intermittent episodes of nonradiating short-lived chest discomfort, not typically exertionally precipitated.  A chest CT from  August 2019 revealed stable left upper lobe nodule but she was noted to have coronary artery calcification suggesting CAD.  When I initially saw her, I titrated her rosuvastatin to 20 mg in light of her LDL of 93.  Her echo Doppler study reveals normal systolic function with grade 1 diastolic dysfunction.  Due to her coronary calcification and significant tobacco history I have scheduled her for coronary CTA.  I had an extensive discussion with her and her husband today reviewing the findings.  Calcium score is significantly increased at 91 placing her in the 97th percentile for age and gender.  She was found to have probable 70 to 90% severe stenosis in the proximal LAD and on on FFR assessment this was felt to be hemodynamically significant at 0.76.  I had a lengthy discussion with her in the office today.  I  have recommended she undergo definitive cardiac catheterization. The risks and benefits of a cardiac catheterization including, but not limited to, death, stroke, MI, kidney damage and bleeding were discussed with the patient who indicates understanding and agrees to proceed.  In light of her significant coronary calcification I have recommended further titration of rosuvastatin to 40 mg and discussed with her potential for coronary regression of LDL cholesterol is 53 or below.  I am adding Toprol-XL 25 mg to her medical regimen.  I will schedule her to undergo the catheterization procedure at my next available cath day is September 06, 2018.  I again discussed the importance of complete smoking cessation.  I reviewed her chest CT and she is scheduled to undergo PET scan imaging next week for further evaluation of her progressive increase in soft tissue thickening along her posterior lateral left upper lobe bulla.  We also discussed her hepatic steatosis.  In January triglycerides were elevated at 154.   Medication Adjustments/Labs and Tests Ordered: Current medicines are reviewed at length with the patient today.  Concerns regarding medicines are outlined above.  Medication changes, Labs and Tests ordered today are listed in the Patient Instructions below. Patient Instructions  Medication Instructions:  Increase Rosuvastatin 40 mg daily Start Metoprolol XL 25 mg daily   If you need a refill on your cardiac medications before your next appointment, please call your pharmacy.   Lab work: We will contact about pre cath lab work If you have labs (blood work) drawn today and your tests are completely normal, you will receive your results only by: Marland Kitchen MyChart Message (if you have MyChart) OR . A paper copy in the mail If you have any lab test that is abnormal or we need to change your treatment, we will call you to review the results.  Testing/Procedures: Your physician has requested that you have a  cardiac catheterization. Cardiac catheterization is used to diagnose and/or treat various heart conditions. Doctors may recommend this procedure for a number of different reasons. The most common reason is to evaluate chest pain. Chest pain can be a symptom of coronary artery disease (CAD), and cardiac catheterization can show whether plaque is narrowing or blocking your heart's arteries. This procedure is also used to evaluate the valves, as well as measure the blood flow and oxygen levels in different parts of your heart. For further information please visit [email or web address]. Please follow instruction sheet, as given.    Follow-Up: At [facility], you and your health needs are our priority.  As part of our continuing mission to provide you with exceptional heart care, we have created designated Provider Care  Teams.  These Care Teams include your primary Cardiologist (physician) and Advanced Practice Providers (APPs -  Physician Assistants and Nurse Practitioners) who all work together to provide you with the care you need, when you need it. You will need a follow up appointment after Cath with Dr.Sanjana Folz one of the following Advanced Practice Providers on your designated Care Team: Almyra Deforest, [place] . Fabian Sharp, PA-C  Any Other Special Instructions Will Be Listed Below (If Applicable).        [facility] [address] [place] [address] Dept: [phone] Loc: [place]  08/26/2018  1. Please arrive at the [facility] (Main Entrance A) at [facility]: [address] [place], [place] [address] ( two hours before your procedure to ensure your preparation). Free valet parking service is available.   Special note: Every effort is made to have your procedure done on time. Please understand that emergencies sometimes delay scheduled procedures.  2. Diet: Do not eat solid  foods after midnight.  The patient may have clear liquids until 5am upon the day of the procedure.  3. Labs: We will call you regarding this.  4. Medication instructions in preparation for your procedure:  On the morning of your procedure, take your Aspirin and any morning medicines NOT listed above.  You may use sips of water.  5. Plan for one night stay--bring personal belongings. 6. Bring a current list of your medications and current insurance cards. 7. You MUST have a responsible person to drive you home. 8. Someone MUST be with you the first 24 hours after you arrive home or your discharge will be delayed. 9. Please wear clothes that are easy to get on and off and wear slip-on shoes.  Thank you for allowing [place] to care for you!   -- [facility]       Signed, Shelva Majestic, MD  08/28/2018 11:41 AM    [facility] [address] [place], [place],   [address] Phone: [phone]

## 2018-08-27 ENCOUNTER — Telehealth: Payer: Self-pay

## 2018-08-27 NOTE — Telephone Encounter (Signed)
New Message   Needs to speak to Almyra Free as a continuation of the medication conversation they had earlier.

## 2018-08-27 NOTE — Telephone Encounter (Signed)
Patient called in to update med list.   She does not take super b complex, pepcid, trellegy, metoprolol succinate; only uses diclofenac PRN, uses equate brand antiacid (listed as maalox/mylanta on med list)

## 2018-08-27 NOTE — Telephone Encounter (Signed)
Called patient, advised of Cath date.  February 10th, 2020. Patient verbalized understanding. I advised I could send her a new letter with the updated dates, but patient states she could write it down, and she had all the other information.  A question regarding lab work is still in place I will message Desiree Lucy, RN to check to make sure her lab work would still be okay to keep current lab work.

## 2018-08-28 ENCOUNTER — Encounter: Payer: Self-pay | Admitting: Cardiovascular Disease

## 2018-08-29 ENCOUNTER — Encounter: Payer: Self-pay | Admitting: Physician Assistant

## 2018-08-29 DIAGNOSIS — K760 Fatty (change of) liver, not elsewhere classified: Secondary | ICD-10-CM | POA: Insufficient documentation

## 2018-08-31 ENCOUNTER — Encounter (HOSPITAL_COMMUNITY)
Admission: RE | Admit: 2018-08-31 | Discharge: 2018-08-31 | Disposition: A | Discharge status: Home / Self Care | Payer: Medicare Other | Source: Ambulatory Visit | Attending: Physician Assistant | Admitting: Physician Assistant

## 2018-08-31 DIAGNOSIS — R9389 Abnormal findings on diagnostic imaging of other specified body structures: Secondary | ICD-10-CM | POA: Diagnosis not present

## 2018-08-31 DIAGNOSIS — C3490 Malignant neoplasm of unspecified part of unspecified bronchus or lung: Secondary | ICD-10-CM | POA: Diagnosis not present

## 2018-08-31 DIAGNOSIS — F17200 Nicotine dependence, unspecified, uncomplicated: Secondary | ICD-10-CM | POA: Insufficient documentation

## 2018-08-31 DIAGNOSIS — J449 Chronic obstructive pulmonary disease, unspecified: Secondary | ICD-10-CM | POA: Diagnosis not present

## 2018-08-31 DIAGNOSIS — R918 Other nonspecific abnormal finding of lung field: Secondary | ICD-10-CM | POA: Insufficient documentation

## 2018-08-31 LAB — GLUCOSE, CAPILLARY: Glucose-Capillary: 92 mg/dL (ref 70–99)

## 2018-08-31 MED ORDER — FLUDEOXYGLUCOSE F - 18 (FDG) INJECTION
8.6000 | Freq: Once | INTRAVENOUS | Status: AC
  Administered 2018-08-31: 8.6 via INTRAVENOUS

## 2018-09-01 ENCOUNTER — Other Ambulatory Visit: Payer: Self-pay | Admitting: Physician Assistant

## 2018-09-01 DIAGNOSIS — R918 Other nonspecific abnormal finding of lung field: Secondary | ICD-10-CM

## 2018-09-01 DIAGNOSIS — R942 Abnormal results of pulmonary function studies: Secondary | ICD-10-CM

## 2018-09-02 ENCOUNTER — Telehealth: Payer: Self-pay | Admitting: *Deleted

## 2018-09-02 NOTE — Telephone Encounter (Signed)
Pt contacted pre-catheterization scheduled at [facility] for: Monday September 06, 2018 7:30 AM Verified arrival time and place: [place] Entrance A at: 5:30 AM  No solid food after midnight prior to cath, clear liquids until 5 AM day of procedure. Contrast allergy: no Verified no diabetes medications: no  Hold: NSAIDS until post procedure.  AM meds can be  taken pre-cath with sip of water including: ASA 81 mg  Confirmed patient has responsible person to drive home post procedure and observe 24 hours after arriving home: yes  I encouraged patient to drink water for  hydration prior to procedure.

## 2018-09-02 NOTE — Telephone Encounter (Signed)
I spoke with patient today, she states she is currently taking metoprolol succinate.

## 2018-09-06 ENCOUNTER — Encounter (HOSPITAL_COMMUNITY)
Admission: RE | Disposition: A | Discharge status: Home / Self Care | Payer: Self-pay | Source: Home / Self Care | Attending: Cardiovascular Disease

## 2018-09-06 ENCOUNTER — Ambulatory Visit (HOSPITAL_COMMUNITY)
Admission: RE | Admit: 2018-09-06 | Discharge: 2018-09-06 | Disposition: A | Discharge status: Home / Self Care | Payer: Medicare Other | Attending: Cardiovascular Disease | Admitting: Cardiovascular Disease

## 2018-09-06 ENCOUNTER — Other Ambulatory Visit: Payer: Self-pay

## 2018-09-06 ENCOUNTER — Encounter (HOSPITAL_COMMUNITY): Payer: Self-pay | Admitting: Cardiovascular Disease

## 2018-09-06 DIAGNOSIS — M170 Bilateral primary osteoarthritis of knee: Secondary | ICD-10-CM | POA: Diagnosis not present

## 2018-09-06 DIAGNOSIS — I2584 Coronary atherosclerosis due to calcified coronary lesion: Secondary | ICD-10-CM | POA: Insufficient documentation

## 2018-09-06 DIAGNOSIS — Z90710 Acquired absence of both cervix and uterus: Secondary | ICD-10-CM | POA: Insufficient documentation

## 2018-09-06 DIAGNOSIS — E785 Hyperlipidemia, unspecified: Secondary | ICD-10-CM | POA: Insufficient documentation

## 2018-09-06 DIAGNOSIS — F17210 Nicotine dependence, cigarettes, uncomplicated: Secondary | ICD-10-CM | POA: Insufficient documentation

## 2018-09-06 DIAGNOSIS — R7303 Prediabetes: Secondary | ICD-10-CM | POA: Diagnosis not present

## 2018-09-06 DIAGNOSIS — Z90721 Acquired absence of ovaries, unilateral: Secondary | ICD-10-CM | POA: Insufficient documentation

## 2018-09-06 DIAGNOSIS — I2510 Atherosclerotic heart disease of native coronary artery without angina pectoris: Secondary | ICD-10-CM | POA: Diagnosis not present

## 2018-09-06 DIAGNOSIS — Z79899 Other long term (current) drug therapy: Secondary | ICD-10-CM | POA: Diagnosis not present

## 2018-09-06 DIAGNOSIS — K760 Fatty (change of) liver, not elsewhere classified: Secondary | ICD-10-CM | POA: Insufficient documentation

## 2018-09-06 DIAGNOSIS — R931 Abnormal findings on diagnostic imaging of heart and coronary circulation: Secondary | ICD-10-CM

## 2018-09-06 DIAGNOSIS — J439 Emphysema, unspecified: Secondary | ICD-10-CM | POA: Diagnosis not present

## 2018-09-06 DIAGNOSIS — Z823 Family history of stroke: Secondary | ICD-10-CM | POA: Insufficient documentation

## 2018-09-06 DIAGNOSIS — Z8249 Family history of ischemic heart disease and other diseases of the circulatory system: Secondary | ICD-10-CM | POA: Insufficient documentation

## 2018-09-06 DIAGNOSIS — K219 Gastro-esophageal reflux disease without esophagitis: Secondary | ICD-10-CM | POA: Diagnosis not present

## 2018-09-06 HISTORY — PX: LEFT HEART CATH AND CORONARY ANGIOGRAPHY: CATH118249

## 2018-09-06 LAB — BASIC METABOLIC PANEL
Anion gap: 8 (ref 5–15)
BUN: 15 mg/dL (ref 8–23)
CO2: 23 mmol/L (ref 22–32)
Calcium: 9 mg/dL (ref 8.9–10.3)
Chloride: 103 mmol/L (ref 98–111)
Creatinine, Ser: 0.95 mg/dL (ref 0.44–1.00)
GFR calc Af Amer: >60 mL/min (ref >60)
GFR calc non Af Amer: >60 mL/min (ref >60)
Glucose, Bld: 107 mg/dL — ABNORMAL HIGH (ref 70–99)
Potassium: 4.1 mmol/L (ref 3.5–5.1)
Sodium: 134 mmol/L — ABNORMAL LOW (ref 135–145)

## 2018-09-06 SURGERY — LEFT HEART CATH AND CORONARY ANGIOGRAPHY
Anesthesia: LOCAL

## 2018-09-06 MED ORDER — SODIUM CHLORIDE 0.9% FLUSH: 3.0000 mL | INTRAVENOUS | Status: DC | PRN

## 2018-09-06 MED ORDER — DIAZEPAM 5 MG PO TABS: 5.0000 mg | ORAL_TABLET | Freq: Four times a day (QID) | ORAL | Status: DC | PRN

## 2018-09-06 MED ORDER — HEPARIN SODIUM (PORCINE) 1000 UNIT/ML IJ SOLN
INTRAMUSCULAR | Status: AC
  Filled 2018-09-06: qty 1

## 2018-09-06 MED ORDER — SODIUM CHLORIDE 0.9 % WEIGHT BASED INFUSION
3.0000 mL/kg/h | INTRAVENOUS | Status: AC
  Administered 2018-09-06: 3 mL/kg/h via INTRAVENOUS

## 2018-09-06 MED ORDER — SODIUM CHLORIDE 0.9 % WEIGHT BASED INFUSION
1.0000 mL/kg/h | INTRAVENOUS | Status: DC
  Administered 2018-09-06: 250 mL via INTRAVENOUS

## 2018-09-06 MED ORDER — IOHEXOL 350 MG/ML SOLN
INTRAVENOUS | Status: DC | PRN
  Administered 2018-09-06: 45 mL via INTRA_ARTERIAL

## 2018-09-06 MED ORDER — SODIUM CHLORIDE 0.9 % IV SOLN: INTRAVENOUS | Status: DC

## 2018-09-06 MED ORDER — ASPIRIN 81 MG PO CHEW: 81.0000 mg | CHEWABLE_TABLET | ORAL | Status: DC

## 2018-09-06 MED ORDER — MIDAZOLAM HCL 2 MG/2ML IJ SOLN
INTRAMUSCULAR | Status: AC
  Filled 2018-09-06: qty 2

## 2018-09-06 MED ORDER — VERAPAMIL HCL 2.5 MG/ML IV SOLN
INTRAVENOUS | Status: AC
  Filled 2018-09-06: qty 2

## 2018-09-06 MED ORDER — ASPIRIN 81 MG PO CHEW: 81.0000 mg | CHEWABLE_TABLET | Freq: Every day | ORAL | Status: DC

## 2018-09-06 MED ORDER — SODIUM CHLORIDE 0.9 % IV SOLN: 250.0000 mL | INTRAVENOUS | Status: DC | PRN

## 2018-09-06 MED ORDER — HEPARIN (PORCINE) IN NACL 1000-0.9 UT/500ML-% IV SOLN
INTRAVENOUS | Status: AC
  Filled 2018-09-06: qty 1000

## 2018-09-06 MED ORDER — HEPARIN (PORCINE) IN NACL 1000-0.9 UT/500ML-% IV SOLN
INTRAVENOUS | Status: DC | PRN
  Administered 2018-09-06 (×2): 500 mL

## 2018-09-06 MED ORDER — HEPARIN SODIUM (PORCINE) 1000 UNIT/ML IJ SOLN
INTRAMUSCULAR | Status: DC | PRN
  Administered 2018-09-06: 4000 [IU] via INTRAVENOUS

## 2018-09-06 MED ORDER — SODIUM CHLORIDE 0.9% FLUSH: 3.0000 mL | Freq: Two times a day (BID) | INTRAVENOUS | Status: DC

## 2018-09-06 MED ORDER — FENTANYL CITRATE (PF) 100 MCG/2ML IJ SOLN
INTRAMUSCULAR | Status: DC | PRN
  Administered 2018-09-06: 25 ug via INTRAVENOUS

## 2018-09-06 MED ORDER — LIDOCAINE HCL (PF) 1 % IJ SOLN
INTRAMUSCULAR | Status: AC
  Filled 2018-09-06: qty 30

## 2018-09-06 MED ORDER — ACETAMINOPHEN 325 MG PO TABS: 650.0000 mg | ORAL_TABLET | ORAL | Status: DC | PRN

## 2018-09-06 MED ORDER — LIDOCAINE HCL (PF) 1 % IJ SOLN
INTRAMUSCULAR | Status: DC | PRN
  Administered 2018-09-06: 2 mL

## 2018-09-06 MED ORDER — FENTANYL CITRATE (PF) 100 MCG/2ML IJ SOLN
INTRAMUSCULAR | Status: AC
  Filled 2018-09-06: qty 2

## 2018-09-06 MED ORDER — ONDANSETRON HCL 4 MG/2ML IJ SOLN: 4.0000 mg | Freq: Four times a day (QID) | INTRAMUSCULAR | Status: DC | PRN

## 2018-09-06 MED ORDER — MIDAZOLAM HCL 2 MG/2ML IJ SOLN
INTRAMUSCULAR | Status: DC | PRN
  Administered 2018-09-06: 2 mg via INTRAVENOUS

## 2018-09-06 MED ORDER — VERAPAMIL HCL 2.5 MG/ML IV SOLN
INTRAVENOUS | Status: DC | PRN
  Administered 2018-09-06: 10 mL via INTRA_ARTERIAL

## 2018-09-06 SURGICAL SUPPLY — 10 items
CATH INFINITI 5FR ANG PIGTAIL (CATHETERS) ×2 IMPLANT
CATH OPTITORQUE TIG 4.0 5F (CATHETERS) ×2 IMPLANT
DEVICE RAD COMP TR BAND LRG (VASCULAR PRODUCTS) ×2 IMPLANT
GLIDESHEATH SLEND SS 6F .021 (SHEATH) ×2 IMPLANT
GUIDEWIRE INQWIRE 1.5J.035X260 (WIRE) ×1 IMPLANT
INQWIRE 1.5J .035X260CM (WIRE) ×2
KIT HEART LEFT (KITS) ×2 IMPLANT
PACK CARDIAC CATHETERIZATION (CUSTOM PROCEDURE TRAY) ×2 IMPLANT
TRANSDUCER W/STOPCOCK (MISCELLANEOUS) ×2 IMPLANT
TUBING CIL FLEX 10 FLL-RA (TUBING) ×2 IMPLANT

## 2018-09-06 NOTE — Interval H&P Note (Signed)
Cath Lab Visit (complete for each Cath Lab visit)  Clinical Evaluation Leading to the Procedure:   ACS: No.  Non-ACS:    Anginal Classification: CCS II  Anti-ischemic medical therapy: Minimal Therapy (1 class of medications)  Non-Invasive Test Results: Intermediate-risk stress test findings: cardiac mortality 1-3%/year  Prior CABG: No previous CABG      History and Physical Interval Note:  09/06/2018 7:33 AM  Karen Barrera  has presented today for surgery, with the diagnosis of abnorm. ct  The various methods of treatment have been discussed with the patient and family. After consideration of risks, benefits and other options for treatment, the patient has consented to  Procedure(s): LEFT HEART CATH AND CORONARY ANGIOGRAPHY (N/A) as a surgical intervention .  The patient's history has been reviewed, patient examined, no change in status, stable for surgery.  I have reviewed the patient's chart and labs.  Questions were answered to the patient's satisfaction.     Shelva Majestic

## 2018-09-06 NOTE — Discharge Instructions (Signed)
Radial Site Care ° °This sheet gives you information about how to care for yourself after your procedure. Your health care provider may also give you more specific instructions. If you have problems or questions, contact your health care provider. °What can I expect after the procedure? °After the procedure, it is common to have: °· Bruising and tenderness at the catheter insertion area. °Follow these instructions at home: °Medicines °· Take over-the-counter and prescription medicines only as told by your health care provider. °Insertion site care °· Follow instructions from your health care provider about how to take care of your insertion site. Make sure you: °? Wash your hands with soap and water before you change your bandage (dressing). If soap and water are not available, use hand sanitizer. °? Change your dressing as told by your health care provider. °? Leave stitches (sutures), skin glue, or adhesive strips in place. These skin closures may need to stay in place for 2 weeks or longer. If adhesive strip edges start to loosen and curl up, you may trim the loose edges. Do not remove adhesive strips completely unless your health care provider tells you to do that. °· Check your insertion site every day for signs of infection. Check for: °? Redness, swelling, or pain. °? Fluid or blood. °? Pus or a bad smell. °? Warmth. °· Do not take baths, swim, or use a hot tub until your health care provider approves. °· You may shower 24-48 hours after the procedure, or as directed by your health care provider. °? Remove the dressing and gently wash the site with plain soap and water. °? Pat the area dry with a clean towel. °? Do not rub the site. That could cause bleeding. °· Do not apply powder or lotion to the site. °Activity ° °· For 24 hours after the procedure, or as directed by your health care provider: °? Do not flex or bend the affected arm. °? Do not push or pull heavy objects with the affected arm. °? Do not  drive yourself home from the hospital or clinic. You may drive 24 hours after the procedure unless your health care provider tells you not to. °? Do not operate machinery or power tools. °· Do not lift anything that is heavier than 10 lb (4.5 kg), or the limit that you are told, until your health care provider says that it is safe. °· Ask your health care provider when it is okay to: °? Return to work or school. °? Resume usual physical activities or sports. °? Resume sexual activity. °General instructions °· If the catheter site starts to bleed, raise your arm and put firm pressure on the site. If the bleeding does not stop, get help right away. This is a medical emergency. °· If you went home on the same day as your procedure, a responsible adult should be with you for the first 24 hours after you arrive home. °· Keep all follow-up visits as told by your health care provider. This is important. °Contact a health care provider if: °· You have a fever. °· You have redness, swelling, or yellow drainage around your insertion site. °Get help right away if: °· You have unusual pain at the radial site. °· The catheter insertion area swells very fast. °· The insertion area is bleeding, and the bleeding does not stop when you hold steady pressure on the area. °· Your arm or hand becomes pale, cool, tingly, or numb. °These symptoms may represent a serious problem   that is an emergency. Do not wait to see if the symptoms will go away. Get medical help right away. Call your local emergency services (911 in the [place] Do not drive yourself to the hospital. °Summary °· After the procedure, it is common to have bruising and tenderness at the site. °· Follow instructions from your health care provider about how to take care of your radial site wound. Check the wound every day for signs of infection. °· Do not lift anything that is heavier than 10 lb (4.5 kg), or the limit that you are told, until your health care provider says  that it is safe. °This information is not intended to replace advice given to you by your health care provider. Make sure you discuss any questions you have with your health care provider. °Document Released: 08/16/2010 Document Revised: 08/19/2017 Document Reviewed: 08/19/2017 °Elsevier Interactive Patient Education © 2019 [employer] ° °

## 2018-09-13 ENCOUNTER — Ambulatory Visit (INDEPENDENT_AMBULATORY_CARE_PROVIDER_SITE_OTHER): Payer: Medicare Other | Admitting: Physician Assistant

## 2018-09-13 ENCOUNTER — Encounter: Payer: Self-pay | Admitting: Physician Assistant

## 2018-09-13 VITALS — BP 136/80 | HR 67 | Ht 63.0 in | Wt 181.4 lb

## 2018-09-13 DIAGNOSIS — J449 Chronic obstructive pulmonary disease, unspecified: Secondary | ICD-10-CM | POA: Diagnosis not present

## 2018-09-13 DIAGNOSIS — E785 Hyperlipidemia, unspecified: Secondary | ICD-10-CM

## 2018-09-13 DIAGNOSIS — I2510 Atherosclerotic heart disease of native coronary artery without angina pectoris: Secondary | ICD-10-CM | POA: Diagnosis not present

## 2018-09-13 DIAGNOSIS — R7303 Prediabetes: Secondary | ICD-10-CM | POA: Diagnosis not present

## 2018-09-13 DIAGNOSIS — Z720 Tobacco use: Secondary | ICD-10-CM | POA: Diagnosis not present

## 2018-09-13 MED ORDER — ROSUVASTATIN CALCIUM 40 MG PO TABS: 40.0000 mg | ORAL_TABLET | Freq: Every day | ORAL | 1 refills | Status: DC

## 2018-09-13 NOTE — Patient Instructions (Addendum)
Medication Instructions:   Your physician recommends that you continue on your current medications as directed. Please refer to the Current Medication list given to you today.  If you need a refill on your cardiac medications before your next appointment, please call your pharmacy.   Lab work:  NONE  If you have labs (blood work) drawn today and your tests are completely normal, you will receive your results only by: Marland Kitchen MyChart Message (if you have MyChart) OR . A paper copy in the mail If you have any lab test that is abnormal or we need to change your treatment, we will call you to review the results.  Testing/Procedures:  NONE  Follow-Up: At [facility], you and your health needs are our priority.  As part of our continuing mission to provide you with exceptional heart care, we have created designated Provider Care Teams.  These Care Teams include your primary Cardiologist (physician) and Advanced Practice Providers (APPs -  Physician Assistants and Nurse Practitioners) who all work together to provide you with the care you need, when you need it. You will need a follow up appointment in 3-4 months.  Please call our office 2 months in advance to schedule this appointment.  You may see Shelva Majestic, MD or one of the following Advanced Practice Providers on your designated Care Team: [place], [place] . Fabian Sharp, PA-C  Any Other Special Instructions Will Be Listed Below (If Applicable).   Continue to work on the smoking cessation

## 2018-09-13 NOTE — Progress Notes (Signed)
Cardiology Office Note    Date:  09/15/2018   ID:  Karen Barrera, DOB 06-30-1951, MRN [id number]  PCP:  [name], MD  Cardiologist:  Dr. Claiborne Billings   Chief Complaint  Patient presents with  . Follow-up    seen for Dr. Claiborne Billings.     History of Present Illness:  Karen Barrera is a 68 y.o. female with PMH of tobacco abuse, COPD, GERD, prediabetes, history of seizure, lung nodule, hyperlipidemia and recently diagnosed CAD.  Patient was referred to cardiology service in December 2019 for evaluation of coronary calcification that was seen incidentally on the chest CT.  Her Crestor was increased.  Echocardiogram showed EF of 45 to 60%, grade 1 DD, mild dilated aortic root, no significant valvular issue.  Subsequent coronary CT however came back abnormal showing coronary artery calcium score of 891 placing her at 97th percentile for age and gender, possible 21 to 90% stenosis in proximal LAD and mild stenosis in proximal left circumflex.  FFR analysis was also abnormal in the mid LAD territory.  She recently underwent PET scan on 08/31/2018 which showed no evidence of metastatic disease, it does reveal hepatic steatosis and right hepatic lobe cyst, left adrenal adenoma, and a low level of hypermetabolic activity associated with increasing peripheral nodularity around the left upper lobe of the lung, this does not exclude the possibility of indolent neoplasm.  She underwent diagnostic cardiac catheterization on 09/06/2018 which showed 30% proximal RCA, 20% mid RCA, 20% proximal left circumflex, 20% ostial OM1, 20% ostial LAD and 30% proximal LAD lesion followed by 55% mid LAD lesion.  Medical therapy was recommended.  She was started on metoprolol succinate 25 mg daily.  Tobacco cessation was strongly advised.  Patient presents today for follow-up.  Right radial cath site appears to be stable.  She says that she has decreased her tobacco smoking from previous 1-1/2pack to 2 cigarettes/day.  We have  further discussed the importance of tobacco cessation.  She also mentions her Crestor was recently increased to 40 mg daily.  I will defer repeat lab work to primary care provider's office.  Last lab work does show that her LDL remains high at 98.  LDL goal is less than 70 in her case.  Otherwise she does complain of tightness in the chest not associated with exertion, she thinks her bra is too tight.  I reassured her that this is unlikely to be cardiac in nature.  We discussed the recent cath report.  She is doing well on the current Toprol-XL.  Her blood pressure is very well controlled at home, her blood pressure here is slightly elevated.  She says her blood pressure at home is usually around 798 systolic.  Otherwise she will continue on the current therapy.  Past Medical History:  Diagnosis Date  . Acute perforated appendicitis 10/31/2014  . Adenomatous colon polyp   . Allergic rhinitis   . Allergy   . Anemia   . Arthritis    "fingers" (10/31/2014)  . Chronic bronchitis ([place])    "got it q yr til I had my nose OR" (10/31/2014)  . COPD (chronic obstructive pulmonary disease) ([place])   . DDD (degenerative disc disease), cervical   . DDD (degenerative disc disease), lumbar   . Depression    "short term when my sister passed away unexpectedly"  . Family history of adverse reaction to anesthesia    "mother is hard to wake up; a little goes a long way w/her"  . Family  history of breast cancer   . Family history of colon cancer   . Family history of skin cancer   . GERD (gastroesophageal reflux disease)   . History of stomach ulcers   . Hyperlipidemia    "borderline; RX is preventative" (10/31/2014)  . Kidney stones    "they passed"  . Migraine    "used to get them alot; don't get them anymore" (10/31/2014)  . Osteoarthritis of both knees   . Osteopenia   . Pneumonia 2003 X 1  . Pre-diabetes   . Seizures ([place]) ~ 1962 X 1   "from an abscessed wisdom tooth"  . Tobacco dependence   . Vitamin D  deficiency     Past Surgical History:  Procedure Laterality Date  . APPENDECTOMY    . ARTHROTOMY Right 01/11/2016   Procedure: DISTAL INTERPHALANGEAL JOINT ARTHROTOMY RIGHT INDEX FINGER;  Surgeon: Leanora Cover, MD;  Location: [facility];  Service: Orthopedics;  Laterality: Right;  . COLONOSCOPY    . CYST EXCISION Right 01/11/2016   Procedure: EXCISION MUCOID CYST;  Surgeon: Leanora Cover, MD;  Location: [facility];  Service: Orthopedics;  Laterality: Right;  . LAPAROSCOPIC APPENDECTOMY N/A 10/31/2014   Procedure: APPENDECTOMY LAPAROSCOPIC;  Surgeon: Alphonsa Overall, MD;  Location: [facility];  Service: General;  Laterality: N/A;  . LEFT HEART CATH AND CORONARY ANGIOGRAPHY N/A 09/06/2018   Procedure: LEFT HEART CATH AND CORONARY ANGIOGRAPHY;  Surgeon: Troy Sine, MD;  Location: [facility];  Service: Cardiovascular;  Laterality: N/A;  . SALPINGOOPHORECTOMY Right 1999   "w/grapefruit-sized polyp"  . SHOULDER ARTHROSCOPY Right 2003   removal spurs  . SHOULDER ARTHROSCOPY W/ ROTATOR CUFF REPAIR Left 2012  . SINUS EXPLORATION  12/2013   [facility]  . TENDON REPAIR Left 10/2009   torn tendon @ elbow  . VAGINAL HYSTERECTOMY  1978    Current Medications: Outpatient Medications Prior to Visit  Medication Sig Dispense Refill  . albuterol (PROVENTIL HFA;VENTOLIN HFA) 108 (90 Base) MCG/ACT inhaler Use 2 inhalations 5 minutes apart every 4 hours PRN. (Patient taking differently: Inhale 2 puffs into the lungs every 6 (six) hours as needed for wheezing or shortness of breath. ) 18 g 3  . albuterol (PROVENTIL) (2.5 MG/3ML) 0.083% nebulizer solution Take 2.5 mg by nebulization every 6 (six) hours as needed for wheezing or shortness of breath.    Marland Kitchen alum & mag hydroxide-simeth (MAALOX/MYLANTA) 200-200-20 MG/5ML suspension Take 15-30 mLs by mouth every 6 (six) hours as needed for indigestion or heartburn.    Marland Kitchen aspirin EC 81 MG tablet Take 81 mg by mouth daily.    Marland Kitchen buPROPion  (WELLBUTRIN XL) 150 MG 24 hr tablet Take 1 tablet every morning for Mood (Patient taking differently: Take 150 mg by mouth daily. Take 1 tablet every morning for Mood) 90 tablet 1  . cetirizine (ZYRTEC) 10 MG tablet Take 10 mg by mouth daily.    . Cholecalciferol (VITAMIN D-3) 5000 UNITS TABS Take 10,000 Units by mouth daily.     . Coenzyme Q10 (COQ10) 200 MG CAPS Take 200 mg by mouth daily.    . diclofenac sodium (VOLTAREN) 1 % GEL Apply 2 g topically 4 (four) times daily as needed.    . diphenhydrAMINE (BENADRYL) 25 MG tablet Take 25 mg by mouth daily.    . diphenhydrAMINE (SOMINEX) 25 MG tablet Take 25 mg by mouth daily.    Marland Kitchen estradiol (ESTRACE) 1 MG tablet Take 1 mg by mouth daily.  3  .  ibuprofen (ADVIL,MOTRIN) 200 MG tablet Take 600 mg by mouth every 8 (eight) hours as needed (pain/headaches.).    Marland Kitchen metoprolol succinate (TOPROL XL) 25 MG 24 hr tablet Take 1 tablet (25 mg total) by mouth daily.    . montelukast (SINGULAIR) 10 MG tablet TAKE 1 TABLET AT BEDTIME (Patient taking differently: Take 10 mg by mouth daily. ) 90 tablet 1  . sertraline (ZOLOFT) 100 MG tablet Take 1 tablet daily for Mood (Patient taking differently: Take 100 mg by mouth daily. ) 90 tablet 3  . umeclidinium-vilanterol (ANORO ELLIPTA) 62.5-25 MCG/INH AEPB Inhale 1 puff into the lungs daily. 3 each 3  . rosuvastatin (CRESTOR) 20 MG tablet Take 40 mg by mouth daily.    . rosuvastatin (CRESTOR) 40 MG tablet Take 0.5 tablets (20 mg total) by mouth daily. 90 tablet 1   No facility-administered medications prior to visit.      Allergies:   Chantix [varenicline] and Moxifloxacin hcl in nacl   Social History   Socioeconomic History  . Marital status: Married    Spouse name: Not on file  . Number of children: Not on file  . Years of education: Not on file  . Highest education level: Not on file  Occupational History  . Occupation: Retired  Scientific laboratory technician  . Financial resource strain: Not on file  . Food insecurity:     Worry: Not on file    Inability: Not on file  . Transportation needs:    Medical: Not on file    Non-medical: Not on file  Tobacco Use  . Smoking status: Current Every Day Smoker    Packs/day: 1.00    Years: 25.00    Pack years: 25.00    Types: Cigarettes  . Smokeless tobacco: Never Used  . Tobacco comment: Pt currently smoking .75ppd as of 06/05/17  Substance and Sexual Activity  . Alcohol use: No    Alcohol/week: 0.0 standard drinks  . Drug use: No  . Sexual activity: Not Currently  Lifestyle  . Physical activity:    Days per week: Not on file    Minutes per session: Not on file  . Stress: Not on file  Relationships  . Social connections:    Talks on phone: Not on file    Gets together: Not on file    Attends religious service: Not on file    Active member of club or organization: Not on file    Attends meetings of clubs or organizations: Not on file    Relationship status: Not on file  Other Topics Concern  . Not on file  Social History Narrative  . Not on file     Family History:  The patient's family history includes Arthritis in her paternal grandmother; Breast cancer in an other family member; Breast cancer (age of onset: 31) in an other family member; Cancer in an other family member; Colon cancer in an other family member; Colon cancer (age of onset: 15) in some other family members; Colon polyps in her father; Diabetes in her mother; Hyperlipidemia in her maternal grandfather; Melanoma (age of onset: 74) in her paternal grandmother; Seizures in her sister; Skin cancer (age of onset: 63) in her mother; Stroke in her maternal grandmother.   ROS:   Please see the history of present illness.    ROS All other systems reviewed and are negative.   PHYSICAL EXAM:   VS:  BP 136/80   Pulse 67   Ht 5\' 3"  (1.6 m)  Wt 181 lb 6.4 oz (82.3 kg)   SpO2 94%   BMI 32.13 kg/m    GEN: Well nourished, well developed, in no acute distress  HEENT: normal  Neck: no JVD, carotid  bruits, or masses Cardiac: RRR; no murmurs, rubs, or gallops,no edema  Respiratory:  clear to auscultation bilaterally, normal work of breathing GI: soft, nontender, nondistended, + BS MS: no deformity or atrophy  Skin: warm and dry, no rash Neuro:  Alert and Oriented x 3, Strength and sensation are intact Psych: euthymic mood, full affect  Wt Readings from Last 3 Encounters:  09/13/18 181 lb 6.4 oz (82.3 kg)  09/06/18 180 lb (81.6 kg)  08/26/18 180 lb (81.6 kg)      Studies/Labs Reviewed:   EKG:  EKG is not ordered today.    Recent Labs: 08/11/2018: ALT 18; Hemoglobin 14.0; Magnesium 2.1; Platelets 252; TSH 2.10 09/06/2018: BUN 15; Creatinine, Ser 0.95; Potassium 4.1; Sodium 134   Lipid Panel    Component Value Date/Time   CHOL 200 (H) 08/11/2018 1432   TRIG 154 (H) 08/11/2018 1432   HDL 76 08/11/2018 1432   CHOLHDL 2.6 08/11/2018 1432   VLDL 29 11/12/2016 1730   LDLCALC 98 08/11/2018 1432    Additional studies/ records that were reviewed today include:   Cath 09/06/2018  Prox RCA lesion is 30% stenosed.  Mid RCA lesion is 20% stenosed.  Prox Cx lesion is 20% stenosed.  Ost 1st Mrg lesion is 20% stenosed.  Ost LAD to Prox LAD lesion is 20% stenosed.  Prox LAD to Mid LAD lesion is 30% stenosed.  Mid LAD lesion is 55% stenosed.   There is evidence for moderate coronary calcification without high-grade obstructive CAD.  There is 20 to 30% proximal to mid LAD stenoses with 55% mid stenosis in the mid vessel beyond a small diagonal vessel; more significant calcification involving the proximal left circumflex coronary artery with 20% proximal narrowing and 20% narrowing in the OM1 vessel; and 30 and 20% proximal to mid RCA stenoses.  Normal LV function without focal segmental wall motion abnormalities.  LVEDP 18 mm; however, the patient's blood pressure was low   prior to the procedure and she had received a 250 cc normal saline bolus.  RECOMMENDATION: Initial  medical therapy.  With multivessel coronary calcification, recommend high potency statin therapy and will titrate rosuvastatin to 40 mg.  Patient had been started on Toprol-XL 25 mg.  Depending upon blood pressure, additional medical therapy can be added as needed. Recommend aspirin 81 mg.  Smoking cessation is essential.    ASSESSMENT:    1. Coronary artery disease involving native coronary artery of native heart without angina pectoris   2. Chronic obstructive pulmonary disease, unspecified COPD type ([place])   3. Prediabetes   4. Hyperlipidemia LDL goal <70   5. Tobacco abuse      PLAN:  In order of problems listed above:  1. CAD: Nonobstructive disease noted on recent cardiac catheterization.  Continue medical therapy.  Tobacco cessation strongly advised.  Continue aspirin and statin  2. COPD: No acute exacerbation  3. Tobacco abuse: She has cut back on smoking, however further advised that she completely quit smoking at this point.  4. Hyperlipidemia: On Crestor 40 mg daily.  Recent lipid panel obtained on 08/11/2018 showed LDL 98.  Will need to repeat lab work in a few months.  LDL goal less than 70.  If continue to be elevated, will refer the patient to [facility].  5. Prediabetes: Managed by primary care provider    Medication Adjustments/Labs and Tests Ordered: Current medicines are reviewed at length with the patient today.  Concerns regarding medicines are outlined above.  Medication changes, Labs and Tests ordered today are listed in the Patient Instructions below. Patient Instructions  Medication Instructions:   Your physician recommends that you continue on your current medications as directed. Please refer to the Current Medication list given to you today.  If you need a refill on your cardiac medications before your next appointment, please call your pharmacy.   Lab work:  NONE  If you have labs (blood work) drawn today and your tests are completely normal, you will  receive your results only by: Marland Kitchen MyChart Message (if you have MyChart) OR . A paper copy in the mail If you have any lab test that is abnormal or we need to change your treatment, we will call you to review the results.  Testing/Procedures:  NONE  Follow-Up: At [facility], you and your health needs are our priority.  As part of our continuing mission to provide you with exceptional heart care, we have created designated Provider Care Teams.  These Care Teams include your primary Cardiologist (physician) and Advanced Practice Providers (APPs -  Physician Assistants and Nurse Practitioners) who all work together to provide you with the care you need, when you need it. You will need a follow up appointment in 3-4 months.  Please call our office 2 months in advance to schedule this appointment.  You may see Shelva Majestic, MD or one of the following Advanced Practice Providers on your designated Care Team: [place], [place] . Fabian Sharp, PA-C  Any Other Special Instructions Will Be Listed Below (If Applicable).   Continue to work on the smoking cessation       Hilbert Corrigan, [place]  09/15/2018 1:38 PM    [address] [employer], [place], [place]  [address] Phone: [phone]; Fax: [phone]

## 2018-09-14 ENCOUNTER — Encounter: Payer: Medicare Other | Admitting: Cardiothoracic Surgery

## 2018-09-15 ENCOUNTER — Encounter: Payer: Self-pay | Admitting: Physician Assistant

## 2018-09-17 ENCOUNTER — Other Ambulatory Visit: Payer: Self-pay

## 2018-09-17 ENCOUNTER — Other Ambulatory Visit: Payer: Self-pay | Admitting: *Deleted

## 2018-09-17 ENCOUNTER — Institutional Professional Consult (permissible substitution) (INDEPENDENT_AMBULATORY_CARE_PROVIDER_SITE_OTHER): Payer: Medicare Other | Admitting: Cardiothoracic Surgery

## 2018-09-17 ENCOUNTER — Encounter: Payer: Self-pay | Admitting: Cardiothoracic Surgery

## 2018-09-17 VITALS — BP 138/88 | HR 67 | Resp 18 | Ht 63.0 in | Wt 181.8 lb

## 2018-09-17 DIAGNOSIS — R918 Other nonspecific abnormal finding of lung field: Secondary | ICD-10-CM | POA: Diagnosis not present

## 2018-09-17 DIAGNOSIS — I2510 Atherosclerotic heart disease of native coronary artery without angina pectoris: Secondary | ICD-10-CM

## 2018-09-17 NOTE — Progress Notes (Signed)
[address]       [place] [address]             [phone]                    Karen Barrera [facility] Medical Record #[id number] Date of Birth: 1951/03/05  Referring: Vicie Mutters, PA-C Primary Care: [name], MD Primary Cardiologist: Shelva Majestic, MD Pulmonary: Dr Annamaria Boots  Chief Complaint:    Chief Complaint  Patient presents with  . Lung Lesion    Surgical eval, PET Scan 08/31/18, Chest CT 08/17/18,     History of Present Illness:    Karen Barrera 68 y.o. female is seen in the office  today for evaluation of left upper lobe along with 6 mm nodular density is slowly increased in size on serial CT scans.  Patient has known underlying COPD.  She has been a long-term smoker more than 50 years 1 pack/day, notes that she is currently cut down to a third of a pack a day.   On CT scan she was noted to have significant calcification of her coronary arteries, underwent cardiac CT by Dr. Claiborne Billings and subsequent cardiac catheterization.-Proceeding with medical therapy for LAD disease.   Current Activity/ Functional Status:  Patient is independent with mobility/ambulation, transfers, ADL's, IADL's.   Zubrod Score: At the time of surgery this patient's most appropriate activity status/level should be described as: _0     0    Normal activity, no symptoms _1     1    Restricted in physical strenuous activity but ambulatory, able to do out light work _2     2    Ambulatory and capable of self care, unable to do work activities, up and about               >50 % of waking hours                              _3     3    Only limited self care, in bed greater than 50% of waking hours _4     4    Completely disabled, no self care, confined to bed or chair _5     5    Moribund   Past Medical History:  Diagnosis Date  . Acute perforated appendicitis 10/31/2014  . Adenomatous colon polyp   . Allergic rhinitis   . Allergy   . Anemia   . Arthritis    "fingers"  (10/31/2014)  . Chronic bronchitis ([place])    "got it q yr til I had my nose OR" (10/31/2014)  . COPD (chronic obstructive pulmonary disease) ([facility])   . DDD (degenerative disc disease), cervical   . DDD (degenerative disc disease), lumbar   . Depression    "short term when my sister passed away unexpectedly"  . Family history of adverse reaction to anesthesia    "mother is hard to wake up; a little goes a long way w/her"  . Family history of breast cancer   . Family history of colon cancer   . Family history of skin cancer   . GERD (gastroesophageal reflux disease)   . History of stomach ulcers   . Hyperlipidemia    "borderline; RX is preventative" (10/31/2014)  . Kidney stones    "they passed"  . Migraine    "used to get them alot; don't get them anymore" (10/31/2014)  .  Osteoarthritis of both knees   . Osteopenia   . Pneumonia 2003 X 1  . Pre-diabetes   . Seizures ([place]) ~ 1962 X 1   "from an abscessed wisdom tooth"  . Tobacco dependence   . Vitamin D deficiency     Past Surgical History:  Procedure Laterality Date  . APPENDECTOMY    . ARTHROTOMY Right 01/11/2016   Procedure: DISTAL INTERPHALANGEAL JOINT ARTHROTOMY RIGHT INDEX FINGER;  Surgeon: Leanora Cover, MD;  Location: [facility];  Service: Orthopedics;  Laterality: Right;  . COLONOSCOPY    . CYST EXCISION Right 01/11/2016   Procedure: EXCISION MUCOID CYST;  Surgeon: Leanora Cover, MD;  Location: [facility];  Service: Orthopedics;  Laterality: Right;  . LAPAROSCOPIC APPENDECTOMY N/A 10/31/2014   Procedure: APPENDECTOMY LAPAROSCOPIC;  Surgeon: Alphonsa Overall, MD;  Location: [facility];  Service: General;  Laterality: N/A;  . LEFT HEART CATH AND CORONARY ANGIOGRAPHY N/A 09/06/2018   Procedure: LEFT HEART CATH AND CORONARY ANGIOGRAPHY;  Surgeon: Troy Sine, MD;  Location: [facility];  Service: Cardiovascular;  Laterality: N/A;  . SALPINGOOPHORECTOMY Right 1999   "w/grapefruit-sized polyp"  . SHOULDER  ARTHROSCOPY Right 2003   removal spurs  . SHOULDER ARTHROSCOPY W/ ROTATOR CUFF REPAIR Left 2012  . SINUS EXPLORATION  12/2013   [facility]  . TENDON REPAIR Left 10/2009   torn tendon @ elbow  . VAGINAL HYSTERECTOMY  1978    Family History  Problem Relation Age of Onset  . Colon cancer Other        dx 40's/50's  . Colon cancer Other 65  . Diabetes Mother   . Skin cancer Mother 60       unsure what type  . Seizures Sister   . Stroke Maternal Grandmother   . Hyperlipidemia Maternal Grandfather   . Arthritis Paternal Grandmother   . Melanoma Paternal Grandmother 74  . Colon polyps Father        has had about 5 every c-scope (every 5 years-ish)  . Colon cancer Other 51  . Cancer Other        type unknown  . Breast cancer Other 88  . Breast cancer Other   . Stomach cancer Neg Hx   . Esophageal cancer Neg Hx   . Rectal cancer Neg Hx      Social History   Tobacco Use  Smoking Status Current Every Day Smoker  . Packs/day: 1.00  . Years: 25.00  . Pack years: 25.00  . Types: Cigarettes  Smokeless Tobacco Never Used  Tobacco Comment   Pt currently smoking .75ppd as of 06/05/17    Social History   Substance and Sexual Activity  Alcohol Use No  . Alcohol/week: 0.0 standard drinks     Allergies  Allergen Reactions  . Chantix [Varenicline] Anaphylaxis    Makes throat swell up  . Moxifloxacin Hcl In Nacl Other (See Comments)    aches    Current Outpatient Medications  Medication Sig Dispense Refill  . albuterol (PROVENTIL HFA;VENTOLIN HFA) 108 (90 Base) MCG/ACT inhaler Use 2 inhalations 5 minutes apart every 4 hours PRN. (Patient taking differently: Inhale 2 puffs into the lungs every 6 (six) hours as needed for wheezing or shortness of breath. ) 18 g 3  . albuterol (PROVENTIL) (2.5 MG/3ML) 0.083% nebulizer solution Take 2.5 mg by nebulization every 6 (six) hours as needed for wheezing or shortness of breath.    Marland Kitchen alum & mag hydroxide-simeth (MAALOX/MYLANTA) 200-200-20  MG/5ML suspension  Take 15-30 mLs by mouth every 6 (six) hours as needed for indigestion or heartburn.    Marland Kitchen aspirin EC 81 MG tablet Take 81 mg by mouth daily.    Marland Kitchen buPROPion (WELLBUTRIN XL) 150 MG 24 hr tablet Take 1 tablet every morning for Mood (Patient taking differently: Take 150 mg by mouth daily. Take 1 tablet every morning for Mood) 90 tablet 1  . cetirizine (ZYRTEC) 10 MG tablet Take 10 mg by mouth daily.    . Cholecalciferol (VITAMIN D-3) 5000 UNITS TABS Take 10,000 Units by mouth daily.     . Coenzyme Q10 (COQ10) 200 MG CAPS Take 200 mg by mouth daily.    . diclofenac sodium (VOLTAREN) 1 % GEL Apply 2 g topically 4 (four) times daily as needed.    . diphenhydrAMINE (BENADRYL) 25 MG tablet Take 25 mg by mouth daily.    . diphenhydrAMINE (SOMINEX) 25 MG tablet Take 25 mg by mouth daily.    Marland Kitchen estradiol (ESTRACE) 1 MG tablet Take 1 mg by mouth daily.  3  . ibuprofen (ADVIL,MOTRIN) 200 MG tablet Take 600 mg by mouth every 8 (eight) hours as needed (pain/headaches.).    Marland Kitchen metoprolol succinate (TOPROL XL) 25 MG 24 hr tablet Take 1 tablet (25 mg total) by mouth daily.    . montelukast (SINGULAIR) 10 MG tablet TAKE 1 TABLET AT BEDTIME (Patient taking differently: Take 10 mg by mouth daily. ) 90 tablet 1  . rosuvastatin (CRESTOR) 40 MG tablet Take 1 tablet (40 mg total) by mouth daily. 90 tablet 1  . sertraline (ZOLOFT) 100 MG tablet Take 1 tablet daily for Mood (Patient taking differently: Take 100 mg by mouth daily. ) 90 tablet 3  . umeclidinium-vilanterol (ANORO ELLIPTA) 62.5-25 MCG/INH AEPB Inhale 1 puff into the lungs daily. 3 each 3   No current facility-administered medications for this visit.     Pertinent items are noted in HPI.   Review of Systems:     Cardiac Review of Systems: [Y] = yes  or   [ N ] = no   Chest Pain [ n   ]  Resting SOB [n   ] Exertional SOB  [  y]  Orthopnea [  ]   Pedal Edema [ n  ]    Palpitations [n  ] Syncope  [n  ]   Presyncope [   ]   General Review of  Systems: [Y] = yes [  ]=no Constitional: recent weight change [ y ];  Wt loss over the last 3 months [   ] anorexia [  ]; fatigue [  ]; nausea [  ]; night sweats [  ]; fever [  ]; or chills [  ];           Eye : blurred vision [  ]; diplopia [   ]; vision changes [  ];  Amaurosis fugax[  ]; Resp: cough [ y ];  wheezing[  ];  hemoptysis[  ]; shortness of breath[  ]; paroxysmal nocturnal dyspnea[  ]; dyspnea on exertion[  ]; or orthopnea[  ];  GI:  gallstones[  ], vomiting[  ];  dysphagia[  ]; melena[  ];  hematochezia [  ]; heartburn[  ];   Hx of  Colonoscopy[  ]; GU: kidney stones [  ]; hematuria[  ];   dysuria [  ];  nocturia[  ];  history of     obstruction [  ]; urinary frequency [  ]  Skin: rash, swelling[  ];, hair loss[  ];  peripheral edema[  ];  or itching[  ]; Musculosketetal: myalgias[  ];  joint swelling[  ];  joint erythema[  ];  joint pain[  ];  back pain[  ];  Heme/Lymph: bruising[  ];  bleeding[  ];  anemia[  ];  Neuro: TIA[  ];  headaches[  ];  stroke[  ];  vertigo[  ];  seizures[  ];   paresthesias[  ];  difficulty walking[  ];  Psych:depression[  ]; anxiety[  ];  Endocrine: diabetes[  ];  thyroid dysfunction[  ];  Immunizations: Flu up to date Blue.Reese  ]; Pneumococcal up to date [ y ];  Other:     PHYSICAL EXAMINATION: BP 138/88 (BP Location: Left Arm, Patient Position: Sitting, Cuff Size: Large)   Pulse 67   Resp 18   Ht _0  (1.6 m)   Wt 181 lb 12.8 oz (82.5 kg)   SpO2 97% Comment: RA  BMI 32.20 kg/m  General appearance: alert, cooperative and no distress Head: Normocephalic, without obvious abnormality, atraumatic Neck: no adenopathy, no carotid bruit, no JVD, supple, symmetrical, trachea midline and thyroid not enlarged, symmetric, no tenderness/mass/nodules Lymph nodes: Cervical, supraclavicular, and axillary nodes normal. Resp: clear to auscultation bilaterally Cardio: regular rate and rhythm, S1, S2 normal, no murmur, click, rub or gallop GI: soft,  non-tender; bowel sounds normal; no masses,  no organomegaly Extremities: extremities normal, atraumatic, no cyanosis or edema Neurologic: Grossly normal  Diagnostic Studies & Laboratory data:     Recent Radiology Findings:   Nm Pet Image Initial (pi) Skull Base To Thigh  Result Date: 08/31/2018 CLINICAL DATA:  Initial treatment strategy for non-small-cell lung cancer. EXAM: NUCLEAR MEDICINE PET SKULL BASE TO THIGH TECHNIQUE: 8.6 mCi F-18 FDG was injected intravenously. Full-ring PET imaging was performed from the skull base to thigh after the radiotracer. CT data was obtained and used for attenuation correction and anatomic localization. Fasting blood glucose: 92 mg/dl COMPARISON:  Chest CT 08/17/2018, 03/01/2018 and 06/11/2017. FINDINGS: Mediastinal blood pool activity: SUV max 2.8 NECK: No hypermetabolic cervical lymph nodes are identified.There are no lesions of the pharyngeal mucosal space. Incidental CT findings: none CHEST: There are no hypermetabolic mediastinal, hilar or axillary lymph nodes. There is low-level hypermetabolic activity associated with the soft tissue thickening along the posterior aspect of the left upper lobe cystic lesion. This has an SUV max of 1.9 and corresponds with soft tissue thickening measuring approximately 1.7 x 0.8 cm on image 22/8. No other hypermetabolic pulmonary activity. Incidental CT findings: Minimal left upper lobe nodularity on images 11 and 12 of series 8 is stable. Atherosclerosis of the aorta, great vessels and coronary arteries. ABDOMEN/PELVIS: There is no hypermetabolic activity within the liver, adrenal glands, spleen or pancreas. There is no hypermetabolic nodal activity. Incidental CT findings: There is diffuse hepatic steatosis. 5.9 x 4.1 cm cyst posteriorly in the right hepatic lobe (image 91/4) is stable without hypermetabolic activity. Stable 2.1 x 1.3 cm low-density left adrenal adenoma, without hypermetabolic activity. Diffuse aortic and branch  vessel atherosclerosis. Postsurgical changes consistent with previous appendectomy and hysterectomy. SKELETON: There is no hypermetabolic activity to suggest osseous metastatic disease. Incidental CT findings: none IMPRESSION: 1. There is only low level hypermetabolic activity associated with the increasing peripheral nodularity around the left upper lobe bulla. However, this does not exclude the possibility of indolent neoplasm such as adenocarcinoma. I do not see histologic confirmation of non-small cell lung cancer in the  patient's medical record. Referral to multidisciplinary thoracic Oncology Clinic recommended. 2. No evidence of metastatic disease. 3. Hepatic steatosis, cyst posteriorly in the right hepatic lobe and left adrenal adenoma. Electronically Signed   By: Richardean Sale M.D.   On: 08/31/2018 15:35     I have independently reviewed the above radiology studies  and reviewed the findings with the patient.   Recent Lab Findings: Lab Results  Component Value Date   WBC 7.4 08/11/2018   HGB 14.0 08/11/2018   HCT 41.9 08/11/2018   PLT 252 08/11/2018   GLUCOSE 107 (H) 09/06/2018   CHOL 200 (H) 08/11/2018   TRIG 154 (H) 08/11/2018   HDL 76 08/11/2018   LDLCALC 98 08/11/2018   ALT 18 08/11/2018   AST 20 08/11/2018   NA 134 (L) 09/06/2018   K 4.1 09/06/2018   CL 103 09/06/2018   CREATININE 0.95 09/06/2018   BUN 15 09/06/2018   CO2 23 09/06/2018   TSH 2.10 08/11/2018   HGBA1C 5.9 (H) 08/11/2018   CLINICAL DATA:  Left upper lobe lung nodule. Current smoker without history of primary malignancy.  EXAM: CT CHEST WITHOUT CONTRAST  TECHNIQUE: Multidetector CT imaging of the chest was performed following the standard protocol without IV contrast.  COMPARISON:  03/01/2018 diagnostic chest CT. Coronary CTA of 08/13/2018.  FINDINGS: Cardiovascular: Aortic and branch vessel atherosclerosis. Normal heart size, without pericardial effusion. Multivessel coronary artery  atherosclerosis.  Mediastinum/Nodes: No mediastinal or definite hilar adenopathy, given limitations of unenhanced CT.  Lungs/Pleura: No pleural fluid. Mild centrilobular emphysema. Minimal posterior left apical nodularity including at on the order of 3 mm superiorly and 3 mm inferiorly on images 20-22/series 8, similar.  Again identified is a soft tissue density nodule along the posterolateral aspect of the left upper lobe bulla. Especially when compared back to 06/11/2017, there is increased nodularity. For example, today measuresto 10 x 7 mm on image 48/8. On the order of 9 x 4 mm on 06/11/2017 (when remeasured). Similarly, soft tissue density measures up to 12 by 9 mm on sagittal image 135 today versus 10 x 4 mm on 06/11/2017 (when remeasured).  Upper Abdomen: Moderate hepatic steatosis with sparing adjacent the gallbladder. 5.6 cm hypoattenuating posterior right hepatic lobe lesion is fluid density but has enlarged from 4.0 cm on 06/11/2017. This measured 2.6 cm on 10/31/2014 and did not have worrisome features on that post-contrast abdominal CT. Normal imaged portions of the spleen, stomach, pancreas, kidneys. Abdominal aortic atherosclerosis. Bilateral adrenal nodularity is low-density, favoring small adenomas.  Musculoskeletal: Lower cervical spondylosis. Remote fracture of the sternal body.  IMPRESSION: 1. Especially when compared to 06/11/2017, interval increase in soft tissue thickening along the periphery (primarily posterior and lateral) of a left upper lobe bulla. Although this could represent superinfection, an underlying neoplasm is suspected. Consider further evaluation with PET. 2. No evidence of thoracic adenopathy. 3. Aortic atherosclerosis (ICD10-I70.0), coronary artery atherosclerosis and emphysema (ICD10-J43.9). 4. Hepatic steatosis. 5. Enlarging posterior right hepatic lobe lesion which is favored to represent a cyst, especially given presence  back on 10/31/2014. Recommend attention on follow-up.  These results will be called to the ordering clinician or representative by the Radiologist Assistant, and communication documented in the PACS or zVision Dashboard.   Electronically Signed   By: Abigail Miyamoto M.D.   On: 08/18/2018 07:28 CLINICAL DATA:  Followup abnormal chest x-ray.  EXAM: CT CHEST WITHOUT CONTRAST  TECHNIQUE: Multidetector CT imaging of the chest was performed following the standard protocol without IV  contrast.  COMPARISON:  Chest x-ray 06/05/2017 and 03/23/2015  FINDINGS: Cardiovascular: The heart is normal in size. No pericardial effusion. The aorta is normal in caliber. Moderate atherosclerotic calcifications. Dense three-vessel coronary artery calcifications are noted.  Mediastinum/Nodes: No mediastinal or hilar mass or lymphadenopathy. Small scattered lymph nodes are noted. The esophagus is grossly normal.  Lungs/Pleura: Minimal/ mild emphysematous changes. No focal airspace consolidation to suggest pneumonia. No worrisome pulmonary lesions. Small bulla or up lung cysts noted in the left upper lobe on image number 51. Linear scarring type changes in the right upper lobe on image number 49.  Small clusters of airspace nodules noted in the left upper lobe on image number 25, in the right upper lobe on image number 32 and in the superior segment of the left lower lobe on images 42 and 43. This is likely benign inflammatory change or atypical infection such as MAC.  Upper Abdomen: No significant findings. There is a benign segment 7 hepatic cyst. Small bilateral adrenal gland adenomas. No adenopathy or mass.  Musculoskeletal: Remote healed proximal sternal fracture accounting for the recent chest x-ray abnormality. In retrospect I believe the fracture was evident on the lateral chest film from 03/23/2015. No worrisome sternal lesion. The thoracic vertebral bodies  are maintained.  IMPRESSION: 1. The sternal abnormality is a remote healed fracture. 2. Mild emphysematous changes and patchy small airspace nodules, likely atypical infection or inflammation. Recommend followup noncontrast chest CT in 3-4 months. 3. No mediastinal or hilar mass or adenopathy. 4. Atherosclerotic calcifications involving the abdominal and thoracic aorta and coronary arteries. 5. Simple appearing hepatic cyst.  Aortic Atherosclerosis (ICD10-I70.0) and Emphysema (ICD10-J43.9).   Electronically Signed   By: Marijo Sanes M.D.   On: 06/11/2017 14:39   Cardiac cath 09/06/2018  Prox RCA lesion is 30% stenosed.  Mid RCA lesion is 20% stenosed.  Prox Cx lesion is 20% stenosed.  Ost 1st Mrg lesion is 20% stenosed.  Ost LAD to Prox LAD lesion is 20% stenosed.  Prox LAD to Mid LAD lesion is 30% stenosed.  Mid LAD lesion is 55% stenosed.   There is evidence for moderate coronary calcification without high-grade obstructive CAD.  There is 20 to 30% proximal to mid LAD stenoses with 55% mid stenosis in the mid vessel beyond a small diagonal vessel; more significant calcification involving the proximal left circumflex coronary artery with 20% proximal narrowing and 20% narrowing in the OM1 vessel; and 30 and 20% proximal to mid RCA stenoses.  Normal LV function without focal segmental wall motion abnormalities.  LVEDP 18 mm; however, the patient's blood pressure was low   prior to the procedure and she had received a 250 cc normal saline bolus.  RECOMMENDATION: Initial medical therapy.  With multivessel coronary calcification, recommend high potency statin therapy and will titrate rosuvastatin to 40 mg.  Patient had been started on Toprol-XL 25 mg.  Depending upon blood pressure, additional medical therapy can be added as needed. Recommend aspirin 81 mg.  Smoking cessation is essential  Assessment / Plan:   #1 increasing nodular density in the wall of the left upper  lobe bulla, suspicious for carcinoma of the lung and long-term smoker. #2 patient with known COPD, most recent pulmonary functions are 2016 we will repeat PFTs  I discussed with the patient proceeding with primary resection of the left upper lobe lung lesion.  The risks and expectations of surgical resection were discussed in detail with her.  She is considering her options and will  return after pulmonary functions are completed to make a final decision about surgical resection.     I  spent 60 minutes with  the patient face to face and greater then 50% of the time was spent in counseling and coordination of care.    Grace Isaac MD      [address] [place] [address] Office [phone]   Beeper [phone]  09/17/2018 2:04 PM

## 2018-09-17 NOTE — Patient Instructions (Signed)
Pulmonary Nodule A pulmonary nodule is a small, round growth of tissue in the lung. It is sometimes referred to as a "shadow" or "spot on the lung." Nodules range in size from less than 1/5 of an inch (4 mm) to a little bigger than an inch (30 mm). Pulmonary nodules can be either noncancerous (benign) or cancerous (malignant). Most are noncancerous. Smaller nodules in people who do not smoke and do not have any other risk factors for lung cancer are more likely to be noncancerous. Larger, irregular nodules in people who smoke or who have a strong family history of lung cancer are more likely to be cancerous. What are the causes? This condition may be caused by:  A bacterial, fungal, or viral infection, such as tuberculosis. The infection is usually an old and inactive one.  A noncancerous mass of tissue.  Inflammation from conditions such as rheumatoid arthritis.  Abnormal blood vessels in the lungs.  Cancerous tissue, such as lung cancer or a cancer in another part of the body that has spread to the lung. What are the signs or symptoms? This condition usually does not cause symptoms. If symptoms appear, they are usually related to the underlying cause. For example, if the condition is caused by an infection, you may have a cough or fever. How is this diagnosed? This condition is usually diagnosed with an X-ray or CT scan. To help determine whether a pulmonary nodule is benign or malignant, your health care provider will:  Take your medical history.  Perform a physical exam.  Order tests, including: ? Blood tests. ? A skin test called a tuberculin test. This test is done to check if you have been exposed to the germ that causes tuberculosis. ? Chest X-rays. ? A CT scan. This test shows smaller pulmonary nodules more clearly and with more detail than an X-ray. ? A positron emission tomography (PET) scan. This test is done to check if the nodule is cancerous. During the test, a safe amount  of a radioactive substance is injected into the bloodstream. Then a picture is taken. ? Biopsy. In this test, a tiny piece of the pulmonary nodule is removed and then examined under a microscope. How is this treated? Treatment for this condition depends on whether the pulmonary nodule is malignant or benign as well as your risk of getting cancer.  Noncancerous nodules usually do not need to be treated, but they may need to be monitored with CT scans. If a CT scan shows that the pulmonary nodule got bigger, more tests may be done.  Some nodules need to be removed. If this is the case, you may have a procedure called a thoractomy. During the procedure, your health care provider will make an incision in your chest and remove the part of the lung where the nodule is located. Follow these instructions at home:   Take over-the-counter and prescription medicines only as told by your health care provider.  Do not use any products that contain nicotine or tobacco, such as cigarettes and e-cigarettes. If you need help quitting, ask your health care provider.  Keep all follow-up visits as told by your health care provider. This is important. Contact a health care provider if:  You have trouble breathing when you are active.  You feel sick or unusually tired.  You do not feel like eating.  You lose weight without trying.  You develop chills or night sweats. Get help right away if:  You cannot catch your breath.    You begin wheezing.  You cannot stop coughing.  You cough up blood.  You become dizzy or feel like you are going to faint.  You have sudden chest pain.  You have a fever or persistent symptoms for more than 2-3 days.  You have a fever and your symptoms suddenly get worse. Summary  A pulmonary nodule is a small, round growth of tissue in the lung. Most pulmonary nodules are noncancerous.  This condition is usually diagnosed with an X-ray or CT scan.  Common causes of  pulmonary nodules include infection, inflammation, and noncancerous growths.  Though less common, if a nodule is found to be cancerous, you will need specific diagnostic tests and treatment options as directed by your medical provider.  Treatment for this condition depends on whether the pulmonary nodule is benign or malignant as well as your risk of getting cancer. This information is not intended to replace advice given to you by your health care provider. Make sure you discuss any questions you have with your health care provider. Document Released: 05/11/2009 Document Revised: 08/12/2016 Document Reviewed: 08/12/2016 Elsevier Interactive Patient Education  2019 [place] Lung cancer is an abnormal growth of cancerous cells that forms a mass (malignant tumor) in a lung. There are several types of lung cancer. The types are based on the appearance of the tumor cells. The two most common types are:  Non-small cell lung cancer. This type of lung cancer is the most common type. Non-small cell lung cancers include squamous cell carcinoma, adenocarcinoma, and large cell carcinoma.  Small cell lung cancer. In this type of lung cancer, abnormal cells are smaller than those of non-small cell lung cancer. Small cell lung cancer gets worse (progresses) faster than non-small cell lung cancer. What are the causes? The most common cause of lung cancer is smoking tobacco. The second most common cause is exposure to a chemical called radon. What increases the risk? You are more likely to develop this condition if:  You smoke tobacco.  You have been exposed to: ? Secondhand tobacco smoke. ? Radon gas. ? Uranium. ? Asbestos. ? Arsenic in drinking water. ? Air pollution.  You have a family or personal history of lung cancer.  You have had lung radiation therapy in the past.  You are older than age 68. What are the signs or symptoms? In the early stages, you may not have any  symptoms. As the cancer progresses, symptoms may include:  A lasting cough, possibly with blood.  Fatigue.  Unexplained weight loss.  Shortness of breath.  Loud breathing (wheezing).  Chest pain.  Loss of appetite. Symptoms of advanced lung cancer include:  Hoarseness.  Bone or joint pain.  Weakness.  Change in the structure of the fingernails (clubbing), so that the nail looks like an upside-down spoon.  Swelling of the face or arms.  Inability to move the face (paralysis).  Drooping eyelids. How is this diagnosed? This condition may be diagnosed based on:  Your symptoms and medical history.  A physical exam.  A chest X-ray.  A CT scan.  Blood tests.  Sputum tests.  Removal of a sample of lung tissue (lung biopsy) for testing. Your cancer will be assessed (staged) to determine how severe it is and how much it has spread (metastasized). How is this treated? Treatment depends on the type and stage of your cancer. Treatment may include one or more of the following:  Surgery to remove as much of the cancer  as possible. Lymph nodes in the area may be removed and tested for cancer as well.  Medicines that kill cancer cells (chemotherapy).  High-energy rays that kill cancer cells (radiation therapy).  Chemotherapy. This treatment uses medicines to destroy cancer cells.  Targeted therapy. This targets specific parts of cancer cells and the area around them to block the growth and spread of the cancer. Targeted therapy can help limit the damage to healthy cells. Follow these instructions at home: Eating and drinking  Some of your treatments might affect your appetite. If you are having problems eating, or if you do not have an appetite, meet with a dietitian.  If you have side effects that affect your appetite, it may help to: ? Eat smaller meals and snacks often. ? Drink high-nutrition and high-calorie shakes or supplements. ? Eat bland and soft foods that  are easy to eat. ? Avoid eating foods that are hot, spicy, or hard to swallow. General instructions   Do not use any products that contain nicotine or tobacco, such as cigarettes and e-cigarettes. If you need help quitting, ask your health care provider.  Do not drink alcohol.  If you are admitted to the hospital, make sure your cancer specialist (oncologist) is aware. Your cancer may affect your treatment for other conditions.  Take over-the-counter and prescription medicines only as told by your health care provider.  Consider joining a support group for people who have been diagnosed with lung cancer.  Work with your health care provider to manage any side effects of treatment.  Keep all follow-up visits as told by your health care provider. This is important. Where to find more information  American Cancer Society: https://www.cancer.[place] https://www.cancer.gov Contact a health care provider if you:  Lose weight without trying.  Have a persistent cough and wheezing.  Feel short of breath.  Get tired easily.  Have bone or joint pain.  Have difficulty swallowing.  Notice that your voice is changing or getting hoarse.  Have pain that does not get better with medicine. Get help right away if you:  Cough up blood.  Have new breathing problems.  Have chest pain.  Have a fever.  Have swelling in an ankle, leg, or arm, or the face or neck.  Have paralysis in your face.  Are very confused.  Have a drooping eyelid. Summary  Lung cancer is an abnormal growth of cancerous cells that forms a mass (malignant tumor) in a lung.  There are several types of lung cancer. The types are based on the appearance of the tumor cells. The two most common types are non-small cell and small cell.  The most common cause of lung cancer is smoking tobacco.  Early symptoms include a lasting cough, possibly with blood, and fatigue, unexplained  weight loss, and shortness of breath.  After diagnosis, treatment depends on the type and stage of your cancer. This information is not intended to replace advice given to you by your health care provider. Make sure you discuss any questions you have with your health care provider. Document Released: 10/20/2000 Document Revised: 05/21/2017 Document Reviewed: 05/21/2017 Elsevier Interactive Patient Education  2019 [employer]  Lung Resection  A lung resection is a procedure to remove part or all of a lung. An entire lung may be removed (pneumonectomy), or only part of it may be removed (lobectomy). A lung resection may be done as an open surgery or as a minimally invasive surgery. A lung resection  is most often done to remove a tumor or cancer, but it may be done to treat other conditions. The procedure can relieve symptoms and keep the problem from getting worse. Tell a health care provider about:  Any allergies you have.  All medicines you are taking, including vitamins, herbs, eye drops, creams, and over-the-counter medicines.  Any problems you or family members have had with anesthetic medicines.  Any blood disorders you have.  Any surgeries you have had.  Any medical conditions you have.  Whether you are pregnant or may be pregnant. What are the risks? Generally, this is a safe procedure. However, problems may occur, including:  Excessive bleeding.  Infection.  Reaction to anesthesia.  Allergic reaction to medicines.  Blood clots.  Injury to a nerve or blood vessel.  Problems breathing.  Heart problems.  Stroke. What happens before the procedure? Staying hydrated Follow instructions from your health care provider about hydration, which may include:  Up to 2 hours before the procedure - you may continue to drink clear liquids, such as water, clear fruit juice, black coffee, and plain tea. Eating and drinking restrictions Follow instructions from your health  care provider about eating and drinking, which may include:  8 hours before the procedure - stop eating heavy meals or foods such as meat, fried foods, or fatty foods.  6 hours before the procedure - stop eating light meals or foods, such as toast or cereal.  6 hours before the procedure - stop drinking milk or drinks that contain milk.  2 hours before the procedure - stop drinking clear liquids. Medicines Ask your health care provider about:  Changing or stopping your regular medicines. This is especially important if you are taking diabetes medicines or blood thinners.  Taking medicines such as aspirin and ibuprofen. These medicines can thin your blood. Do not take these medicines unless your health care provider tells you to take them.  Taking over-the-counter medicines, vitamins, herbs, and supplements. Tests You may have tests done before the procedure, including:  Blood and urine tests.  Imaging tests, such as X-rays, CT, MRI, or PET scans.  Bronchoscopy. In this procedure, a health care provider uses a flexible tube (bronchoscope) to look at the inside of your airways.  Pulmonary function tests. These are done to check how well your lungs work.  Heart testing. This is done to check heart function before the procedure.  Lymph node sampling. This may be done to see if you have a tumor that has spread. General instructions  Plan to have someone take you home from the hospital or clinic.  Plan to have a responsible adult care for you for at least 24 hours after you leave the hospital or clinic. This is important.  Do not use any products that contain nicotine or tobacco for as long as possible before your procedure. These include cigarettes and e-cigarettes. If you need help quitting, ask your health care provider.  Ask your health care provider what steps will be taken to help prevent infection. These may include: ? Removing hair at the surgery site. ? Washing skin with a  germ-killing soap. ? Taking antibiotic medicine. What happens during the procedure?  An IV will be inserted into one of your veins. You will be given one or both of the following: ? A medicine to help you relax (sedative). ? A medicine to make you fall asleep (general anesthetic).  A breathing tube will be placed in your throat.  A thin tube (  catheter) may be inserted into the part of your body that drains urine from the bladder (urethra). The catheter will drain your urine.  Your health care provider will make a large incision on your side (open lung surgery) or several small incisions over your chest area (minimally invasive surgery).  Your health care provider will carefully cut any tissues leading to the area of the lung being treated.  The lung or part of the lung will then be removed. Lymph nodes near the lung may also be removed for testing.  Your health care provider will check inside your chest to make sure there is no bleeding in or around the lungs.  Your health care provider may put tubes into your chest to drain extra fluid and air after surgery.  Your incisions will be closed. This may be done using stitches (sutures), staples, skin glue, or skin tape (adhesive) strips.  A bandage (dressing) may be placed over your incisions. The procedure may vary among health care providers and hospitals. What happens after the procedure?  Your blood pressure, heart rate, breathing rate, and blood oxygen level will be monitored until you leave the hospital or clinic.  Right after surgery, you may: ? Be moved to the intensive care unit (ICU). ? Continue to have a tube to help you breathe or have a urinary catheter. ? Have an IV for fluids and medicines. ? Remain on a respirator, if assistance is needed to help you breathe. ? Start respiratory therapy in the ICU. This will help your other lung to get strong and stay healthy. ? Be given medicine to help with pain and nausea.  You  may have to wear compression stockings. These stockings help to prevent blood clots and reduce swelling in your legs.  As you continue to recover: ? You will be moved to a regular hospital room. Therapy will continue. ? You may be released to go home or to an extended care facility. Summary  A lung resection is a procedure to remove part or all of a lung. It can be done as an open surgery or a minimally invasive surgery.  A lung resection is most often done to remove a tumor or cancer, but it may be done to treat other conditions.  After surgery, respiratory therapy will be prescribed to help your lung recover and become stronger. This information is not intended to replace advice given to you by your health care provider. Make sure you discuss any questions you have with your health care provider. Document Released: 10/04/2002 Document Revised: 07/27/2017 Document Reviewed: 07/27/2017 Elsevier Interactive Patient Education  2019 [place].   Lung Resection, Care After This sheet gives you information about how to care for yourself after your procedure. Your health care provider may also give you more specific instructions. If you have problems or questions, contact your health care provider. What can I expect after the procedure? After the procedure, it is common to have:  Pain in your throat and near your incisions.  Pain when taking deep breaths.  Nausea.  Tiredness (fatigue). Follow these instructions at home:  Medicines  Take over-the-counter and prescription medicines only as told by your health care provider.  If you were prescribed an antibiotic medicine, take it as told by your health care provider. Do not stop taking the antibiotic even if you start to feel better.  If you are taking prescription pain medicine, take actions to prevent or treat constipation. Your health care provider may recommend that  you: ? Drink enough fluid to keep your urine pale yellow. ? Eat  foods that are high in fiber, such as fresh fruits and vegetables, whole grains, and beans. ? Limit foods that are high in fat and processed sugars, such as fried or sweet foods. ? Take an over-the-counter or prescription medicine for constipation. Incision care  Follow instructions from your health care provider about how to take care of your incisions. Make sure you: ? Wash your hands with soap and water before you change your bandage (dressing). If soap and water are not available, use hand sanitizer. ? Change your dressing as told by your health care provider. ? Leave stitches (sutures), skin glue, or adhesive strips in place. These skin closures may need to stay in place for 2 weeks or longer. If adhesive strip edges start to loosen and curl up, you may trim the loose edges. Do not remove adhesive strips completely unless your health care provider tells you to do that.  Check your incision area every day for signs of infection. Check for: ? Redness, swelling, or pain. ? Fluid or blood. ? Pus or a bad smell. ? Warmth.  Do not take baths, swim, or use a hot tub until your health care provider approves. Ask your health care provider if you may take showers. Preventing pneumonia   Do breathing exercises as instructed by your health care provider. Doing this helps prevent lung infection (pneumonia).  Try to breathe deeply and cough as told by your health care provider. Holding a pillow firmly over your ribs may help with discomfort.  If you were given an incentive spirometer in the hospital, continue to use it as directed by your health care provider.  Participate in pulmonary rehabilitation as directed by your health care provider. This is a program that combines education, exercise, and support from a team of specialists. The goal is to help you heal and get back to your normal activities as soon as possible. Activity  Rest as told by your health care provider.  Avoid sitting for a  long time without moving. Get up to take short walks every 1-2 hours. This is important to improve blood flow and breathing. Ask for help if you feel weak or unsteady.  Ask your health care provider what activities are safe for you.  Do not lift anything that is heavier than 10 lb (4.5 kg), or the limit that you are told, until your health care provider says that it is safe.  Return to a normal diet and activities as told by your health care provider. General instructions  Wear compression stockings as told by your health care provider. These stockings help to prevent blood clots and reduce swelling in your legs.  If you have a chest tube, care for it as instructed by your health care provider. Do not travel by airplane during the 2 weeks after your chest tube is removed, or until your health care provider says that this is safe.  Do not use any products that contain nicotine or tobacco, such as cigarettes and e-cigarettes. These can delay healing after surgery. If you need help quitting, ask your health care provider.  Do not drive until your health care provider approves.  Do not drive or use heavy machinery while taking prescription pain medicine.  Keep all follow-up visits as told by your health care provider. This is important. Contact a health care provider if you:  Have redness, swelling, or pain around your incision.  Have  fluid or blood coming from your incision.  Have pus or a bad smell coming from your incision or bandage.  Have an incision that feels warm to the touch.  Have a fever or chills.  Notice that your incision is breaking open.  Cough up blood or pus, or you develop a cough that produces bad-smelling sputum.  Have pain or swelling in your legs.  Have increasing pain that is not controlled with medicine.  Have trouble managing any of the tubes that have been left in place after surgery. Get help right away if you:  Have chest pain or an irregular or  rapid heartbeat.  Feel weak, light-headed, or dizzy.  Have shortness of breath or difficulty breathing.  Have persistent nausea or vomiting.  Have a rash. These symptoms may represent a serious problem that is an emergency. Do not wait to see if the symptoms will go away. Get medical help right away. Call your local emergency services (911 in the [place] Do not drive yourself to the hospital. Summary  After lung resection surgery, it is common to have pain around your incisions, pain when taking deep breaths, nausea, and fatigue.  Follow instructions from your health care provider about how to take care of your incisions.  Be sure to contact your health care provider if you have any redness or swelling around your incision area or if blood, pus, or other fluid drains from your incision. This information is not intended to replace advice given to you by your health care provider. Make sure you discuss any questions you have with your health care provider. Document Released: 01/31/2005 Document Revised: 07/27/2017 Document Reviewed: 07/27/2017 Elsevier Interactive Patient Education  2019 [employer].

## 2018-09-21 ENCOUNTER — Telehealth: Payer: Self-pay | Admitting: Cardiovascular Disease

## 2018-09-21 ENCOUNTER — Ambulatory Visit (HOSPITAL_COMMUNITY)
Admission: RE | Admit: 2018-09-21 | Discharge: 2018-09-21 | Disposition: A | Discharge status: Home / Self Care | Payer: Medicare Other | Source: Ambulatory Visit | Attending: Cardiothoracic Surgery | Admitting: Cardiothoracic Surgery

## 2018-09-21 DIAGNOSIS — R918 Other nonspecific abnormal finding of lung field: Secondary | ICD-10-CM | POA: Diagnosis not present

## 2018-09-21 LAB — PULMONARY FUNCTION TEST
DL/VA % pred: 87 %
DL/VA: 3.62 ml/min/mmHg/L
DLCO unc % pred: 96 %
DLCO unc: 19.07 ml/min/mmHg
FEF 25-75 Post: 1.95 L/sec
FEF 25-75 Pre: 1.49 L/sec
FEF2575-%Change-Post: 30 %
FEF2575-%Pred-Post: 95 %
FEF2575-%Pred-Pre: 73 %
FEV1-%Change-Post: 5 %
FEV1-%Pred-Post: 113 %
FEV1-%Pred-Pre: 108 %
FEV1-Post: 2.69 L
FEV1-Pre: 2.56 L
FEV1FVC-%Change-Post: 8 %
FEV1FVC-%Pred-Pre: 91 %
FEV6-%Change-Post: -1 %
FEV6-%Pred-Post: 117 %
FEV6-%Pred-Pre: 119 %
FEV6-Post: 3.5 L
FEV6-Pre: 3.55 L
FEV6FVC-%Change-Post: 2 %
FEV6FVC-%Pred-Post: 104 %
FEV6FVC-%Pred-Pre: 101 %
FVC-%Change-Post: -3 %
FVC-%Pred-Post: 113 %
FVC-%Pred-Pre: 117 %
FVC-Post: 3.51 L
FVC-Pre: 3.63 L
Post FEV1/FVC ratio: 77 %
Post FEV6/FVC ratio: 100 %
Pre FEV1/FVC ratio: 70 %
Pre FEV6/FVC Ratio: 98 %
RV % pred: 105 %
RV: 2.24 L
TLC % pred: 118 %
TLC: 5.98 L

## 2018-09-21 MED ORDER — ALBUTEROL SULFATE (2.5 MG/3ML) 0.083% IN NEBU
2.5000 mg | INHALATION_SOLUTION | Freq: Once | RESPIRATORY_TRACT | Status: AC
  Administered 2018-09-21: 2.5 mg via RESPIRATORY_TRACT

## 2018-09-21 NOTE — Telephone Encounter (Signed)
° °  ° °  [facility] Pre-operative Risk Assessment    Request for surgical clearance:  1. What type of surgery is being performed? Left upper lobe , lung resection  2. When is this surgery scheduled? ASAP  3. What type of clearance is required (medical clearance vs. Pharmacy clearance to hold med vs. Both)? both  4. Are there any medications that need to be held prior to surgery and how long?  5. Practice name and name of physician performing surgery? Dr Lanelle Bal  6. What is your office phone number [phone]   7.   What is your office fax number [phone]  8.   Anesthesia type (None, local, MAC, general) ? general   Karen Barrera 09/21/2018, 4:11 PM  _________________________________________________________________   (provider comments below)

## 2018-09-21 NOTE — Telephone Encounter (Signed)
   Primary Cardiologist: Shelva Majestic, MD  Chart reviewed as part of pre-operative protocol coverage. Given past medical history and time since last visit, based on ACC/AHA guidelines, Karen Barrera would be at acceptable risk for the planned procedure without further cardiovascular testing. Pt was seen in the office on 09/13/2018 by Almyra Deforest PA in follow up after LHC from 09/06/2018. She was found to have 30% proximal RCA, 20% mid RCA, 20% proximal left circumflex, 20% ostial OM1, 20% ostial LAD and 30% proximal LAD lesion followed by 55% mid LAD lesion. Echo from 07/14/18 showed normal LV systolic function with EF 55-60%, mildly dilated aortic root-40 mm, and no significant valvular abnormalities. Medical therapy was recommended for non-obstructive CAD and she is on beta blocker, aspirin 81 mg and high intensity statin. She has been strongly advised on smoking cessation.   Karen Barrera has normal renal function, no hx of stroke, A1c 5.9. According to the RCRI risk calculator Karen Barrera is a class II risk due to being intrathoracic surgery with 0.9% risk of major cardiac event perioperatively. This is low risk. She can proceed with the planned surgery.  Beta blocker and statin should be continued perioperatively as long as tolerated to reduce her cardiac risk. Aspirin should be continued if OK with surgeon but can be held if needed.   I will route this recommendation to the requesting party via Epic fax function and remove from pre-op pool.  Please call with questions.  Daune Perch, NP 09/21/2018, 5:15 PM

## 2018-09-22 ENCOUNTER — Ambulatory Visit (INDEPENDENT_AMBULATORY_CARE_PROVIDER_SITE_OTHER): Payer: Medicare Other | Admitting: Cardiothoracic Surgery

## 2018-09-22 ENCOUNTER — Other Ambulatory Visit: Payer: Self-pay | Admitting: *Deleted

## 2018-09-22 ENCOUNTER — Encounter: Payer: Self-pay | Admitting: Cardiothoracic Surgery

## 2018-09-22 VITALS — BP 119/66 | HR 66 | Resp 20 | Ht 63.0 in | Wt 180.0 lb

## 2018-09-22 DIAGNOSIS — I2510 Atherosclerotic heart disease of native coronary artery without angina pectoris: Secondary | ICD-10-CM

## 2018-09-22 DIAGNOSIS — R918 Other nonspecific abnormal finding of lung field: Secondary | ICD-10-CM

## 2018-09-22 NOTE — Progress Notes (Signed)
[address]       [place] [address]             [phone]                    Elham Milne [facility]ora Medical Record #[id number] Date of Birth: February 12, 1951  Referring: Vicie Mutters, PA-C Primary Care: [name], MD Primary Cardiologist: Shelva Majestic, MD Pulmonary: Dr Annamaria Boots  Chief Complaint:    Chief Complaint  Patient presents with  . Lung Lesion    Further discuss surgery, PFT's 09/21/18    History of Present Illness:    Karen Barrera 68 y.o. female is seen in the office  today for evaluation of left upper lobe along with 6 mm nodular density is slowly increased in size on serial CT scans Since last seen we contacted Dr Eston Mould to review her cardiac history and current PFT's.  Patient has known underlying COPD.  She has been a long-term smoker more than 50 years 1 pack/day, notes that she is currently cut down to a third of a pack a day.   On CT scan she was noted to have significant calcification of her coronary arteries, underwent cardiac CT by Dr. Claiborne Billings and subsequent cardiac catheterization.-Proceeding with medical therapy for LAD disease.   Current Activity/ Functional Status:  Patient is independent with mobility/ambulation, transfers, ADL's, IADL's.   Zubrod Score: At the time of surgery this patient's most appropriate activity status/level should be described as: _0     0    Normal activity, no symptoms _1     1    Restricted in physical strenuous activity but ambulatory, able to do out light work _2     2    Ambulatory and capable of self care, unable to do work activities, up and about               >50 % of waking hours                              _3     3    Only limited self care, in bed greater than 50% of waking hours _4     4    Completely disabled, no self care, confined to bed or chair _5     5    Moribund   Past Medical History:  Diagnosis Date  . Acute perforated appendicitis 10/31/2014  . Adenomatous colon polyp   .  Allergic rhinitis   . Allergy   . Anemia   . Arthritis    "fingers" (10/31/2014)  . Chronic bronchitis ([facility])    "got it q yr til I had my nose OR" (10/31/2014)  . COPD (chronic obstructive pulmonary disease) ([facility])   . DDD (degenerative disc disease), cervical   . DDD (degenerative disc disease), lumbar   . Depression    "short term when my sister passed away unexpectedly"  . Family history of adverse reaction to anesthesia    "mother is hard to wake up; a little goes a long way w/her"  . Family history of breast cancer   . Family history of colon cancer   . Family history of skin cancer   . GERD (gastroesophageal reflux disease)   . History of stomach ulcers   . Hyperlipidemia    "borderline; RX is preventative" (10/31/2014)  . Kidney stones    "they passed"  . Migraine    "used  to get them alot; don't get them anymore" (10/31/2014)  . Osteoarthritis of both knees   . Osteopenia   . Pneumonia 2003 X 1  . Pre-diabetes   . Seizures ([facility]) ~ 1962 X 1   "from an abscessed wisdom tooth"  . Tobacco dependence   . Vitamin D deficiency     Past Surgical History:  Procedure Laterality Date  . APPENDECTOMY    . ARTHROTOMY Right 01/11/2016   Procedure: DISTAL INTERPHALANGEAL JOINT ARTHROTOMY RIGHT INDEX FINGER;  Surgeon: Leanora Cover, MD;  Location: [facility];  Service: Orthopedics;  Laterality: Right;  . COLONOSCOPY    . CYST EXCISION Right 01/11/2016   Procedure: EXCISION MUCOID CYST;  Surgeon: Leanora Cover, MD;  Location: [facility];  Service: Orthopedics;  Laterality: Right;  . LAPAROSCOPIC APPENDECTOMY N/A 10/31/2014   Procedure: APPENDECTOMY LAPAROSCOPIC;  Surgeon: Alphonsa Overall, MD;  Location: [facility];  Service: General;  Laterality: N/A;  . LEFT HEART CATH AND CORONARY ANGIOGRAPHY N/A 09/06/2018   Procedure: LEFT HEART CATH AND CORONARY ANGIOGRAPHY;  Surgeon: Troy Sine, MD;  Location: [facility];  Service: Cardiovascular;  Laterality: N/A;  .  SALPINGOOPHORECTOMY Right 1999   "w/grapefruit-sized polyp"  . SHOULDER ARTHROSCOPY Right 2003   removal spurs  . SHOULDER ARTHROSCOPY W/ ROTATOR CUFF REPAIR Left 2012  . SINUS EXPLORATION  12/2013   [facility]  . TENDON REPAIR Left 10/2009   torn tendon @ elbow  . VAGINAL HYSTERECTOMY  1978    Family History  Problem Relation Age of [place]  . Colon cancer Other        dx 40's/50's  . Colon cancer Other 79  . Diabetes Mother   . Skin cancer Mother 48       unsure what type  . Seizures Sister   . Stroke Maternal Grandmother   . Hyperlipidemia Maternal Grandfather   . Arthritis Paternal Grandmother   . Melanoma Paternal Grandmother 39  . Colon polyps Father        has had about 5 every c-scope (every 5 years-ish)  . Colon cancer Other 35  . Cancer Other        type unknown  . Breast cancer Other 77  . Breast cancer Other   . Stomach cancer Neg Hx   . Esophageal cancer Neg Hx   . Rectal cancer Neg Hx      Social History   Tobacco Use  Smoking Status Current Every Day Smoker  . Packs/day: 1.00  . Years: 25.00  . Pack years: 25.00  . Types: Cigarettes  Smokeless Tobacco Never Used  Tobacco Comment   Pt currently smoking .75ppd as of 06/05/17    Social History   Substance and Sexual Activity  Alcohol Use No  . Alcohol/week: 0.0 standard drinks     Allergies  Allergen Reactions  . Chantix [Varenicline] Anaphylaxis    Makes throat swell up  . Moxifloxacin Hcl In Nacl Other (See Comments)    aches    Current Outpatient Medications  Medication Sig Dispense Refill  . albuterol (PROVENTIL HFA;VENTOLIN HFA) 108 (90 Base) MCG/ACT inhaler Use 2 inhalations 5 minutes apart every 4 hours PRN. (Patient taking differently: Inhale 2 puffs into the lungs every 6 (six) hours as needed for wheezing or shortness of breath. ) 18 g 3  . albuterol (PROVENTIL) (2.5 MG/3ML) 0.083% nebulizer solution Take 2.5 mg by nebulization every 6 (six) hours as needed for wheezing or shortness  of breath.    Marland Kitchen  alum & mag hydroxide-simeth (MAALOX/MYLANTA) 200-200-20 MG/5ML suspension Take 15-30 mLs by mouth every 6 (six) hours as needed for indigestion or heartburn.    Marland Kitchen aspirin EC 81 MG tablet Take 81 mg by mouth daily.    Marland Kitchen buPROPion (WELLBUTRIN XL) 150 MG 24 hr tablet Take 1 tablet every morning for Mood (Patient taking differently: Take 150 mg by mouth daily. Take 1 tablet every morning for Mood) 90 tablet 1  . cetirizine (ZYRTEC) 10 MG tablet Take 10 mg by mouth daily.    . Cholecalciferol (VITAMIN D-3) 5000 UNITS TABS Take 10,000 Units by mouth daily.     . Coenzyme Q10 (COQ10) 200 MG CAPS Take 200 mg by mouth daily.    . diclofenac sodium (VOLTAREN) 1 % GEL Apply 2 g topically 4 (four) times daily as needed.    . diphenhydrAMINE (BENADRYL) 25 MG tablet Take 25 mg by mouth daily.    . diphenhydrAMINE (SOMINEX) 25 MG tablet Take 25 mg by mouth daily.    Marland Kitchen estradiol (ESTRACE) 1 MG tablet Take 1 mg by mouth daily.  3  . ibuprofen (ADVIL,MOTRIN) 200 MG tablet Take 600 mg by mouth every 8 (eight) hours as needed (pain/headaches.).    Marland Kitchen metoprolol succinate (TOPROL XL) 25 MG 24 hr tablet Take 1 tablet (25 mg total) by mouth daily.    . montelukast (SINGULAIR) 10 MG tablet TAKE 1 TABLET AT BEDTIME (Patient taking differently: Take 10 mg by mouth daily. ) 90 tablet 1  . rosuvastatin (CRESTOR) 40 MG tablet Take 1 tablet (40 mg total) by mouth daily. 90 tablet 1  . sertraline (ZOLOFT) 100 MG tablet Take 1 tablet daily for Mood (Patient taking differently: Take 100 mg by mouth daily. ) 90 tablet 3  . umeclidinium-vilanterol (ANORO ELLIPTA) 62.5-25 MCG/INH AEPB Inhale 1 puff into the lungs daily. 3 each 3   No current facility-administered medications for this visit.     Pertinent items are noted in HPI.   Review of Systems:     Cardiac Review of Systems: [Y] = yes  or   [ N ] = no   Chest Pain [ N  ]  Resting SOB Aqua.Slicker  ] Exertional SOB  [Y]  Orthopnea [  ]   Pedal Edema [ N ]      Palpitations Aqua.Slicker ] Syncope  Aqua.Slicker ]   Presyncope [   ]   General Review of Systems: [Y] = yes [  ]=no Constitional: recent weight change [ Y ];  Wt loss over the last 3 months [   ] anorexia [  ]; fatigue [  ]; nausea [  ]; night sweats [  ]; fever [  ]; or chills [  ];           Eye : blurred vision [  ]; diplopia [   ]; vision changes [  ];  Amaurosis fugax[  ]; Resp: cough [ Y ];  wheezing[  ];  hemoptysis[  ]; shortness of breath[  ]; paroxysmal nocturnal dyspnea[  ]; dyspnea on exertion[  ]; or orthopnea[  ];  GI:  gallstones[  ], vomiting[  ];  dysphagia[  ]; melena[  ];  hematochezia [  ]; heartburn[  ];   Hx of  Colonoscopy[  ]; GU: kidney stones [  ]; hematuria[  ];   dysuria [  ];  nocturia[  ];  history of     obstruction [  ]; urinary frequency [  ]  Skin: rash, swelling[  ];, hair loss[  ];  peripheral edema[  ];  or itching[  ]; Musculosketetal: myalgias[  ];  joint swelling[  ];  joint erythema[  ];  joint pain[  ];  back pain[  ];  Heme/Lymph: bruising[  ];  bleeding[  ];  anemia[  ];  Neuro: TIA[  ];  headaches[  ];  stroke[  ];  vertigo[  ];  seizures[  ];   paresthesias[  ];  difficulty walking[  ];  Psych:depression[  ]; anxiety[  ];  Endocrine: diabetes[  ];  thyroid dysfunction[  ];  Immunizations: Flu up to date Jazmín.[place]  ]; Pneumococcal up to date [ Y];  Other:     PHYSICAL EXAMINATION: BP 119/66   Pulse 66   Resp 20   Ht _0  (1.6 m)   Wt 180 lb (81.6 kg)   SpO2 94% Comment: RA  BMI 31.89 kg/m  General appearance: alert, cooperative and no distress Head: Normocephalic, without obvious abnormality, atraumatic Lymph nodes: Cervical, supraclavicular, and axillary nodes normal. Resp: clear to auscultation bilaterally Back: symmetric, no curvature. ROM normal. No CVA tenderness. Cardio: regular rate and rhythm, S1, S2 normal, no murmur, click, rub or gallop GI: soft, non-tender; bowel sounds normal; no masses,  no organomegaly Extremities: extremities  normal, atraumatic, no cyanosis or edema Neurologic: Grossly normal   Diagnostic Studies & Laboratory data:     Recent Radiology Findings:   Nm Pet Image Initial (pi) Skull Base To Thigh  Result Date: 08/31/2018 CLINICAL DATA:  Initial treatment strategy for non-small-cell lung cancer. EXAM: NUCLEAR MEDICINE PET SKULL BASE TO THIGH TECHNIQUE: 8.6 mCi F-18 FDG was injected intravenously. Full-ring PET imaging was performed from the skull base to thigh after the radiotracer. CT data was obtained and used for attenuation correction and anatomic localization. Fasting blood glucose: 92 mg/dl COMPARISON:  Chest CT 08/17/2018, 03/01/2018 and 06/11/2017. FINDINGS: Mediastinal blood pool activity: SUV max 2.8 NECK: No hypermetabolic cervical lymph nodes are identified.There are no lesions of the pharyngeal mucosal space. Incidental CT findings: none CHEST: There are no hypermetabolic mediastinal, hilar or axillary lymph nodes. There is low-level hypermetabolic activity associated with the soft tissue thickening along the posterior aspect of the left upper lobe cystic lesion. This has an SUV max of 1.9 and corresponds with soft tissue thickening measuring approximately 1.7 x 0.8 cm on image 22/8. No other hypermetabolic pulmonary activity. Incidental CT findings: Minimal left upper lobe nodularity on images 11 and 12 of series 8 is stable. Atherosclerosis of the aorta, great vessels and coronary arteries. ABDOMEN/PELVIS: There is no hypermetabolic activity within the liver, adrenal glands, spleen or pancreas. There is no hypermetabolic nodal activity. Incidental CT findings: There is diffuse hepatic steatosis. 5.9 x 4.1 cm cyst posteriorly in the right hepatic lobe (image 91/4) is stable without hypermetabolic activity. Stable 2.1 x 1.3 cm low-density left adrenal adenoma, without hypermetabolic activity. Diffuse aortic and branch vessel atherosclerosis. Postsurgical changes consistent with previous appendectomy and  hysterectomy. SKELETON: There is no hypermetabolic activity to suggest osseous metastatic disease. Incidental CT findings: none IMPRESSION: 1. There is only low level hypermetabolic activity associated with the increasing peripheral nodularity around the left upper lobe bulla. However, this does not exclude the possibility of indolent neoplasm such as adenocarcinoma. I do not see histologic confirmation of non-small cell lung cancer in the patient's medical record. Referral to multidisciplinary thoracic Oncology Clinic recommended. 2. No evidence of metastatic disease. 3. Hepatic steatosis, cyst posteriorly in  the right hepatic lobe and left adrenal adenoma. Electronically Signed   By: Richardean Sale M.D.   On: 08/31/2018 15:35     I have independently reviewed the above radiology studies  and reviewed the findings with the patient.   Recent Lab Findings: Lab Results  Component Value Date   WBC 7.4 08/11/2018   HGB 14.0 08/11/2018   HCT 41.9 08/11/2018   PLT 252 08/11/2018   GLUCOSE 107 (H) 09/06/2018   CHOL 200 (H) 08/11/2018   TRIG 154 (H) 08/11/2018   HDL 76 08/11/2018   LDLCALC 98 08/11/2018   ALT 18 08/11/2018   AST 20 08/11/2018   NA 134 (L) 09/06/2018   K 4.1 09/06/2018   CL 103 09/06/2018   CREATININE 0.95 09/06/2018   BUN 15 09/06/2018   CO2 23 09/06/2018   TSH 2.10 08/11/2018   HGBA1C 5.9 (H) 08/11/2018   CLINICAL DATA:  Left upper lobe lung nodule. Current smoker without history of primary malignancy.  EXAM: CT CHEST WITHOUT CONTRAST  TECHNIQUE: Multidetector CT imaging of the chest was performed following the standard protocol without IV contrast.  COMPARISON:  03/01/2018 diagnostic chest CT. Coronary CTA of 08/13/2018.  FINDINGS: Cardiovascular: Aortic and branch vessel atherosclerosis. Normal heart size, without pericardial effusion. Multivessel coronary artery atherosclerosis.  Mediastinum/Nodes: No mediastinal or definite hilar adenopathy, given  limitations of unenhanced CT.  Lungs/Pleura: No pleural fluid. Mild centrilobular emphysema. Minimal posterior left apical nodularity including at on the order of 3 mm superiorly and 3 mm inferiorly on images 20-22/series 8, similar.  Again identified is a soft tissue density nodule along the posterolateral aspect of the left upper lobe bulla. Especially when compared back to 06/11/2017, there is increased nodularity. For example, today measuresto 10 x 7 mm on image 48/8. On the order of 9 x 4 mm on 06/11/2017 (when remeasured). Similarly, soft tissue density measures up to 12 by 9 mm on sagittal image 135 today versus 10 x 4 mm on 06/11/2017 (when remeasured).  Upper Abdomen: Moderate hepatic steatosis with sparing adjacent the gallbladder. 5.6 cm hypoattenuating posterior right hepatic lobe lesion is fluid density but has enlarged from 4.0 cm on 06/11/2017. This measured 2.6 cm on 10/31/2014 and did not have worrisome features on that post-contrast abdominal CT. Normal imaged portions of the spleen, stomach, pancreas, kidneys. Abdominal aortic atherosclerosis. Bilateral adrenal nodularity is low-density, favoring small adenomas.  Musculoskeletal: Lower cervical spondylosis. Remote fracture of the sternal body.  IMPRESSION: 1. Especially when compared to 06/11/2017, interval increase in soft tissue thickening along the periphery (primarily posterior and lateral) of a left upper lobe bulla. Although this could represent superinfection, an underlying neoplasm is suspected. Consider further evaluation with PET. 2. No evidence of thoracic adenopathy. 3. Aortic atherosclerosis (ICD10-I70.0), coronary artery atherosclerosis and emphysema (ICD10-J43.9). 4. Hepatic steatosis. 5. Enlarging posterior right hepatic lobe lesion which is favored to represent a cyst, especially given presence back on 10/31/2014. Recommend attention on follow-up.  These results will be called to the  ordering clinician or representative by the Radiologist Assistant, and communication documented in the PACS or zVision Dashboard.   Electronically Signed   By: Abigail Miyamoto M.D.   On: 08/18/2018 07:28 CLINICAL DATA:  Followup abnormal chest x-ray.  EXAM: CT CHEST WITHOUT CONTRAST  TECHNIQUE: Multidetector CT imaging of the chest was performed following the standard protocol without IV contrast.  COMPARISON:  Chest x-ray 06/05/2017 and 03/23/2015  FINDINGS: Cardiovascular: The heart is normal in size. No pericardial effusion. The  aorta is normal in caliber. Moderate atherosclerotic calcifications. Dense three-vessel coronary artery calcifications are noted.  Mediastinum/Nodes: No mediastinal or hilar mass or lymphadenopathy. Small scattered lymph nodes are noted. The esophagus is grossly normal.  Lungs/Pleura: Minimal/ mild emphysematous changes. No focal airspace consolidation to suggest pneumonia. No worrisome pulmonary lesions. Small bulla or up lung cysts noted in the left upper lobe on image number 51. Linear scarring type changes in the right upper lobe on image number 49.  Small clusters of airspace nodules noted in the left upper lobe on image number 25, in the right upper lobe on image number 32 and in the superior segment of the left lower lobe on images 42 and 43. This is likely benign inflammatory change or atypical infection such as MAC.  Upper Abdomen: No significant findings. There is a benign segment 7 hepatic cyst. Small bilateral adrenal gland adenomas. No adenopathy or mass.  Musculoskeletal: Remote healed proximal sternal fracture accounting for the recent chest x-ray abnormality. In retrospect I believe the fracture was evident on the lateral chest film from 03/23/2015. No worrisome sternal lesion. The thoracic vertebral bodies are maintained.  IMPRESSION: 1. The sternal abnormality is a remote healed fracture. 2. Mild  emphysematous changes and patchy small airspace nodules, likely atypical infection or inflammation. Recommend followup noncontrast chest CT in 3-4 months. 3. No mediastinal or hilar mass or adenopathy. 4. Atherosclerotic calcifications involving the abdominal and thoracic aorta and coronary arteries. 5. Simple appearing hepatic cyst.  Aortic Atherosclerosis (ICD10-I70.0) and Emphysema (ICD10-J43.9).   Electronically Signed   By: Marijo Sanes M.D.   On: 06/11/2017 14:39   Cardiac cath 09/06/2018  Prox RCA lesion is 30% stenosed.  Mid RCA lesion is 20% stenosed.  Prox Cx lesion is 20% stenosed.  Ost 1st Mrg lesion is 20% stenosed.  Ost LAD to Prox LAD lesion is 20% stenosed.  Prox LAD to Mid LAD lesion is 30% stenosed.  Mid LAD lesion is 55% stenosed.   There is evidence for moderate coronary calcification without high-grade obstructive CAD.  There is 20 to 30% proximal to mid LAD stenoses with 55% mid stenosis in the mid vessel beyond a small diagonal vessel; more significant calcification involving the proximal left circumflex coronary artery with 20% proximal narrowing and 20% narrowing in the OM1 vessel; and 30 and 20% proximal to mid RCA stenoses.  Normal LV function without focal segmental wall motion abnormalities.  LVEDP 18 mm; however, the patient's blood pressure was low   prior to the procedure and she had received a 250 cc normal saline bolus.  RECOMMENDATION: Initial medical therapy.  With multivessel coronary calcification, recommend high potency statin therapy and will titrate rosuvastatin to 40 mg.  Patient had been started on Toprol-XL 25 mg.  Depending upon blood pressure, additional medical therapy can be added as needed. Recommend aspirin 81 mg.  Smoking cessation is essential  PFT'S 08/2018 FEV1  2.56   108% DLCO 19.07 96%  Assessment / Plan:   #1 increasing nodular density in the wall of the left upper lobe bulla, suspicious for carcinoma of the  lung and long-term smoker. #2 patient with known COPD, most recent pulmonary functions are 2016 we will repeat PFTs  I discussed with the patient and her husband  proceeding with primary resection of the left upper lobe lung lesion.  The risks and expectations of surgical resection were discussed in detail with her and her husband. Her questions have been answered. Plan to proceed March 3.  Grace Isaac MD      [address] [place] [address] Office [phone]   Beeper [phone]  09/22/2018 3:22 PM

## 2018-09-22 NOTE — Telephone Encounter (Signed)
Agree, okay to proceed with surgery.  Continue aspirin.

## 2018-09-23 NOTE — Pre-Procedure Instructions (Signed)
Karen Barrera [facility]  09/23/2018      [id number] [place], [place] - [address] AT [place] [address] [place] [address] Phone: [phone] Fax: [phone]    Your procedure is scheduled on March 3  Report to [facility] 5:30 A.M.  Call this number if you have problems the morning of surgery:  [phone]   Remember:  Do not eat or drink after midnight.      Take these medicines the morning of surgery with A SIP OF WATER :               Albuterol inhaler if needed--bring to hospital              Albuterol nebulizer              bupropion (wellbutrin)              Cetirizine (zyrtec)              Benadryl              Estradiol (estrace)              Metoprolol (toprolol-xl)              Sertraline (zoloft)              anoro ellipta--bring to hospital            7 days prior to surgery STOP taking Aleve, Naproxen, Ibuprofen, Motrin, Advil, Goody's, BC's, all herbal medications, fish oil, and all vitamins, voltaren gel                 Do not wear jewelry, make-up or nail polish.  Do not wear lotions, powders, or perfumes, or deodorant.  Do not shave 48 hours prior to surgery.  Men may shave face and neck.  Do not bring valuables to the hospital.  [facility] is not responsible for any belongings or valuables.  Contacts, dentures or bridgework may not be worn into surgery.  Leave your suitcase in the car.  After surgery it may be brought to your room.  For patients admitted to the hospital, discharge time will be determined by your treatment team.  Patients discharged the day of surgery will not be allowed to drive home.    Special instructions:  [facility] [place]- Preparing For Surgery  Before surgery, you can play an important role. Because skin is not sterile, your skin needs to be as free of germs as possible. You can reduce the number of germs on your skin by washing with CHG (chlorahexidine  gluconate) Soap before surgery.  CHG is an antiseptic cleaner which kills germs and bonds with the skin to continue killing germs even after washing.    Oral Hygiene is also important to reduce your risk of infection.  Remember - BRUSH YOUR TEETH THE MORNING OF SURGERY WITH YOUR REGULAR TOOTHPASTE  Please do not use if you have an allergy to CHG or antibacterial soaps. If your skin becomes reddened/irritated stop using the CHG.  Do not shave (including legs and underarms) for at least 48 hours prior to first CHG shower. It is OK to shave your face.  Please follow these instructions carefully.   1. Shower the NIGHT BEFORE SURGERY and the MORNING OF SURGERY with CHG.   2. If you chose to wash your hair, wash your hair first as usual with your normal shampoo.  3. After you shampoo, rinse your hair and body thoroughly to remove the shampoo.  4. Use CHG as you would any other liquid soap. You can apply CHG directly to the skin and wash gently with a scrungie or a clean washcloth.   5. Apply the CHG Soap to your body ONLY FROM THE NECK DOWN.  Do not use on open wounds or open sores. Avoid contact with your eyes, ears, mouth and genitals (private parts). Wash Face and genitals (private parts)  with your normal soap.  6. Wash thoroughly, paying special attention to the area where your surgery will be performed.  7. Thoroughly rinse your body with warm water from the neck down.  8. DO NOT shower/wash with your normal soap after using and rinsing off the CHG Soap.  9. Pat yourself dry with a CLEAN TOWEL.  10. Wear CLEAN PAJAMAS to bed the night before surgery, wear comfortable clothes the morning of surgery  11. Place CLEAN SHEETS on your bed the night of your first shower and DO NOT SLEEP WITH PETS.    Day of Surgery:  Do not apply any deodorants/lotions.  Please wear clean clothes to the hospital/surgery center.   Remember to brush your teeth WITH YOUR REGULAR TOOTHPASTE.    Please  read over the following fact sheets that you were given. Coughing and Deep Breathing, MRSA Information and Surgical Site Infection Prevention

## 2018-09-24 ENCOUNTER — Encounter: Payer: Self-pay | Admitting: Cardiovascular Disease

## 2018-09-24 ENCOUNTER — Other Ambulatory Visit: Payer: Self-pay | Admitting: *Deleted

## 2018-09-24 ENCOUNTER — Encounter (HOSPITAL_COMMUNITY)
Admission: RE | Admit: 2018-09-24 | Discharge: 2018-09-24 | Disposition: A | Discharge status: Home / Self Care | Payer: Medicare Other | Source: Ambulatory Visit | Attending: Cardiothoracic Surgery | Admitting: Cardiothoracic Surgery

## 2018-09-24 ENCOUNTER — Other Ambulatory Visit: Payer: Self-pay

## 2018-09-24 ENCOUNTER — Encounter (HOSPITAL_COMMUNITY): Payer: Self-pay

## 2018-09-24 ENCOUNTER — Ambulatory Visit (HOSPITAL_COMMUNITY)
Admission: RE | Admit: 2018-09-24 | Discharge: 2018-09-24 | Disposition: A | Discharge status: Home / Self Care | Payer: Medicare Other | Source: Ambulatory Visit | Attending: Cardiothoracic Surgery | Admitting: Cardiothoracic Surgery

## 2018-09-24 DIAGNOSIS — R918 Other nonspecific abnormal finding of lung field: Secondary | ICD-10-CM

## 2018-09-24 HISTORY — DX: Atherosclerotic heart disease of native coronary artery without angina pectoris: I25.10

## 2018-09-24 LAB — BLOOD GAS, ARTERIAL
Acid-base deficit: 1.2 mmol/L (ref 0.0–2.0)
Bicarbonate: 22.4 mmol/L (ref 20.0–28.0)
Drawn by: 470591
FIO2: 21
O2 Saturation: 96.5 %
Patient temperature: 98.6
pCO2 arterial: 33.5 mmHg (ref 32.0–48.0)
pH, Arterial: 7.44 (ref 7.350–7.450)
pO2, Arterial: 86.5 mmHg (ref 83.0–108.0)

## 2018-09-24 LAB — TYPE AND SCREEN
ABO/RH(D): AB NEG
Antibody Screen: NEGATIVE

## 2018-09-24 LAB — CBC
HCT: 44.1 % (ref 36.0–46.0)
Hemoglobin: 14.3 g/dL (ref 12.0–15.0)
MCH: 29.9 pg (ref 26.0–34.0)
MCHC: 32.4 g/dL (ref 30.0–36.0)
MCV: 92.1 fL (ref 80.0–100.0)
Platelets: 179 10*3/uL (ref 150–400)
RBC: 4.79 MIL/uL (ref 3.87–5.11)
RDW: 12.9 % (ref 11.5–15.5)
WBC: 7.6 10*3/uL (ref 4.0–10.5)
nRBC: 0 % (ref 0.0–0.2)

## 2018-09-24 LAB — URINALYSIS, ROUTINE W REFLEX MICROSCOPIC
Bacteria, UA: NONE SEEN
Bilirubin Urine: NEGATIVE
Glucose, UA: NEGATIVE mg/dL
Ketones, ur: NEGATIVE mg/dL
Leukocytes,Ua: NEGATIVE
Nitrite: NEGATIVE
Protein, ur: NEGATIVE mg/dL
Specific Gravity, Urine: 1.003 — ABNORMAL LOW (ref 1.005–1.030)
pH: 7 (ref 5.0–8.0)

## 2018-09-24 LAB — COMPREHENSIVE METABOLIC PANEL
ALT: 24 U/L (ref 0–44)
AST: 23 U/L (ref 15–41)
Albumin: 4.2 g/dL (ref 3.5–5.0)
Alkaline Phosphatase: 50 U/L (ref 38–126)
Anion gap: 11 (ref 5–15)
BUN: 13 mg/dL (ref 8–23)
CO2: 17 mmol/L — ABNORMAL LOW (ref 22–32)
Calcium: 9.6 mg/dL (ref 8.9–10.3)
Chloride: 104 mmol/L (ref 98–111)
Creatinine, Ser: 0.76 mg/dL (ref 0.44–1.00)
GFR calc Af Amer: >60 mL/min (ref >60)
GFR calc non Af Amer: >60 mL/min (ref >60)
Glucose, Bld: 84 mg/dL (ref 70–99)
Potassium: 4.3 mmol/L (ref 3.5–5.1)
Sodium: 132 mmol/L — ABNORMAL LOW (ref 135–145)
Total Bilirubin: 0.3 mg/dL (ref 0.3–1.2)
Total Protein: 7.1 g/dL (ref 6.5–8.1)

## 2018-09-24 LAB — SURGICAL PCR SCREEN
MRSA, PCR: NEGATIVE
Staphylococcus aureus: POSITIVE — AB

## 2018-09-24 LAB — PROTIME-INR
INR: 1.1 (ref 0.8–1.2)
Prothrombin Time: 13.9 seconds (ref 11.4–15.2)

## 2018-09-24 LAB — ABO/RH: ABO/RH(D): AB NEG

## 2018-09-24 LAB — APTT: aPTT: >200 seconds (ref 24–36)

## 2018-09-24 NOTE — Progress Notes (Addendum)
PCP - [name], MD Cardiologist - Shelva Majestic, MD  Chest x-ray - 09/24/2018 at PAT appt EKG - 09/24/2018 at PAT appt  Stress Test - pt denies ECHO - 07/14/18 in EPIC  Cardiac Cath - 09/06/2018  Sleep Study - pt denies CPAP - n/a  Fasting Blood Sugar - n/a Checks Blood Sugar _____ times a day-n/a  Blood Thinner Instructions: n/a Aspirin Instructions: Continue Aspirin per Dr. Claiborne Billings  Anesthesia review: Yes, heart hx  Patient denies shortness of breath, fever, cough and chest pain at PAT appointment  Patient verbalized understanding of instructions that were given to them at the PAT appointment. Patient was also instructed that they will need to review over the PAT instructions again at home before surgery.

## 2018-09-24 NOTE — Progress Notes (Signed)
Surgical PCR positive for staph.  Called and left a confidential voicemail for patient per her request and [facility] [place] [facility] [address]. Per patient request

## 2018-09-24 NOTE — Progress Notes (Addendum)
CRITICAL VALUE ALERT  Critical Value:  PTT -greater than 200  Date & Time Notied:  09/24/2018 1520  Provider Notified: 09/24/2018 1530 Called Dr. Everrett Coombe office and spoke with Caryl Pina, RN-she will inform MD  Orders Received/Actions taken: No orders, patient has already left as she was her for a PAT appointment  Levonne Spiller, RN called from Dr. Everrett Coombe office.  She is trying to get additional labs added for patient.  The lab does not have enough blood so the patient will be coming back in on Monday for redraw.  Thurmond Butts will call and inform patient and enter orders. She has also informed Shelby Dubin. PA-C and will inform scheduler.

## 2018-09-27 ENCOUNTER — Encounter (HOSPITAL_COMMUNITY)
Admission: RE | Admit: 2018-09-27 | Discharge: 2018-09-27 | Disposition: A | Discharge status: Home / Self Care | Payer: Medicare Other | Source: Ambulatory Visit | Attending: Cardiothoracic Surgery | Admitting: Cardiothoracic Surgery

## 2018-09-27 ENCOUNTER — Encounter (HOSPITAL_COMMUNITY): Payer: Self-pay

## 2018-09-27 DIAGNOSIS — R918 Other nonspecific abnormal finding of lung field: Secondary | ICD-10-CM

## 2018-09-27 DIAGNOSIS — Z01812 Encounter for preprocedural laboratory examination: Secondary | ICD-10-CM | POA: Insufficient documentation

## 2018-09-27 LAB — APTT: aPTT: 28 seconds (ref 24–36)

## 2018-09-27 NOTE — Progress Notes (Addendum)
Anesthesia Chart Review:  Case:  [id number] Date/Time:  09/28/18 0715   Procedures:      VIDEO BRONCHOSCOPY (N/A )     VIDEO ASSISTED THORACOSCOPY (VATS)/LUNG RESECTION (Left Chest)   Anesthesia type:  General   Pre-op diagnosis:  LUNG NODULES   Location:  [facility] OR ROOM 14 / [facility]   Surgeon:  Grace Isaac, MD      DISCUSSION: Patient is a 68 year old female scheduled for the above procedure. She has a low level hypermetabolic LUL lung lesion.   History includes smoking, COPD, HLD, pre-diabetes, CAD (20-30% RCA, 20% CX/OM1, 55% LAD, medical therapy 08/2018), GERD, seizure (1960's from "abscessed wisdom tooth"). BMI is consistent with obesity.  Per cardiologist Dr. Claiborne Billings, "Agree, okay to proceed with surgery. Continue aspirin."  Preoperative PTT on 09/24/18 was > 200, but 28 on repeat labs on 09/27/18. Since repeat PTT WNL, she does not need to mixing study per Dr. Servando Snare. If no acute changes then I would anticipate that she can proceed as planned.   VS: BP 119/69   Pulse 61   Temp (!) 36.4 C (Oral)   Resp 20   Ht 5\' 3"  (1.6 m)   Wt 81.3 kg   SpO2 100%   BMI 31.74 kg/m    PROVIDERS: [name], MD is PCP Shelva Majestic, MD is cardiologist. Last visit 09/13/18 by Almyra Deforest, [place]. Smoking cessation encouraged and continued medical therapy for CAD.  Baird Lyons, MD is pulmonologist   LABS: Preoperative labs noted. PTT > 200, thought to be an erroneous result. Repeat PTT on 09/27/18 was 28. (PTT mixing study dilution had also been ordered by surgeon when PTT was > 200, but since it could not be added to 09/24/18 labs, a PTT repeated was repeated first on 09/27/18 and was normal.)  A1c 08/11/18 was 5.9. (all labs ordered are listed, but only abnormal results are displayed)  Labs Reviewed  SURGICAL PCR SCREEN - Abnormal; Notable for the following components:      Result Value   Staphylococcus aureus POSITIVE (*)    All other components within normal limits  APTT - Abnormal; Notable  for the following components:   aPTT >200 (*)    All other components within normal limits  COMPREHENSIVE METABOLIC PANEL - Abnormal; Notable for the following components:   Sodium 132 (*)    CO2 17 (*)    All other components within normal limits  URINALYSIS, ROUTINE W REFLEX MICROSCOPIC - Abnormal; Notable for the following components:   Color, Urine COLORLESS (*)    Specific Gravity, Urine 1.003 (*)    Hgb urine dipstick SMALL (*)    All other components within normal limits  BLOOD GAS, ARTERIAL  CBC  PROTIME-INR  TYPE AND SCREEN  ABO/RH    PFTs 09/21/18: FVC 3.63   117% FEV1  2.56   108% DLCO 19.07   96%   IMAGES: CXR 09/24/18: IMPRESSION: 1.  No active cardiopulmonary disease.  PET Scan 08/31/18: IMPRESSION: 1. There is only low level hypermetabolic activity associated with the increasing peripheral nodularity around the left upper lobe bulla. However, this does not exclude the possibility of indolent neoplasm such as adenocarcinoma. I do not see histologic confirmation of non-small cell lung cancer in the patient's medical record. Referral to multidisciplinary thoracic Oncology Clinic recommended. 2. No evidence of metastatic disease. 3. Hepatic steatosis, cyst posteriorly in the right hepatic lobe and left adrenal adenoma.   EKG: 09/24/18: NSR   CV:  Cardiac cath 09/06/18:  Prox RCA lesion is 30% stenosed.  Mid RCA lesion is 20% stenosed.  Prox Cx lesion is 20% stenosed.  Ost 1st Mrg lesion is 20% stenosed.  Ost LAD to Prox LAD lesion is 20% stenosed.  Prox LAD to Mid LAD lesion is 30% stenosed.  Mid LAD lesion is 55% stenosed. - There is evidence for moderate coronary calcification without high-grade obstructive CAD.  There is 20 to 30% proximal to mid LAD stenoses with 55% mid stenosis in the mid vessel beyond a small diagonal vessel; more significant calcification involving the proximal left circumflex coronary artery with 20% proximal narrowing and  20% narrowing in the OM1 vessel; and 30 and 20% proximal to mid RCA stenoses. - Normal LV function without focal segmental wall motion abnormalities.  LVEDP 18 mm; however, the patient's blood pressure was low   prior to the procedure and she had received a 250 cc normal saline bolus. RECOMMENDATION: Initial medical therapy.  With multivessel coronary calcification, recommend high potency statin therapy and will titrate rosuvastatin to 40 mg.  Patient had been started on Toprol-XL 25 mg.  Depending upon blood pressure, additional medical therapy can be added as needed. Recommend aspirin 81 mg.  Smoking cessation is essential.  CT coronary FFR 08/13/18: FINDINGS: FFR 0.76 mid LAD. IMPRESSION: Hemodynamically significant proximal LAD stenosis.  Echo 07/14/18: Study Conclusions - Left ventricle: The cavity size was normal. Wall thickness was   normal. Systolic function was normal. The estimated ejection   fraction was in the range of 55% to 60%. Wall motion was normal;   there were no regional wall motion abnormalities. Doppler   parameters are consistent with abnormal left ventricular   relaxation (grade 1 diastolic dysfunction). - Aortic valve: There was no stenosis. - Aorta: Mildly dilated aortic root. Aortic root dimension: 40 mm   (ED). - Mitral valve: There was no significant regurgitation. - Right ventricle: The cavity size was normal. Systolic function   was normal. - Tricuspid valve: Peak RV-RA gradient (S): 17 mm Hg. - Pulmonary arteries: PA peak pressure: 20 mm Hg (S). - Inferior vena cava: The vessel was normal in size. The   respirophasic diameter changes were in the normal range (>= 50%),   consistent with normal central venous pressure. Impressions: - Normal LV size with EF 55-60%. Normal RV size and systolic   function. No significant valvular abnormalities.   Past Medical History:  Diagnosis Date  . Acute perforated appendicitis 10/31/2014  . Adenomatous colon polyp    . Allergic rhinitis   . Allergy   . Anemia   . Arthritis    "fingers" (10/31/2014)  . Chronic bronchitis ([place])    "got it q yr til I had my nose OR" (10/31/2014)  . COPD (chronic obstructive pulmonary disease) ([place])   . Coronary artery disease   . DDD (degenerative disc disease), cervical   . DDD (degenerative disc disease), lumbar   . Depression    "short term when my sister passed away unexpectedly"  . Family history of adverse reaction to anesthesia    "mother is hard to wake up; a little goes a long way w/her"  . Family history of breast cancer   . Family history of colon cancer   . Family history of skin cancer   . GERD (gastroesophageal reflux disease)   . History of stomach ulcers   . Hyperlipidemia    "borderline; RX is preventative" (10/31/2014)  . Kidney stones    "they  passed"  . Migraine    "used to get them alot; don't get them anymore" (10/31/2014)  . Osteoarthritis of both knees   . Osteopenia   . Pneumonia 2003 X 1  . Pre-diabetes   . Seizures ([place]) ~ 1962 X 1   "from an abscessed wisdom tooth"  . Tobacco dependence   . Vitamin D deficiency     Past Surgical History:  Procedure Laterality Date  . APPENDECTOMY    . ARTHROTOMY Right 01/11/2016   Procedure: DISTAL INTERPHALANGEAL JOINT ARTHROTOMY RIGHT INDEX FINGER;  Surgeon: Leanora Cover, MD;  Location: [place] [facility];  Service: Orthopedics;  Laterality: Right;  . COLONOSCOPY    . CYST EXCISION Right 01/11/2016   Procedure: EXCISION MUCOID CYST;  Surgeon: Leanora Cover, MD;  Location: [facility];  Service: Orthopedics;  Laterality: Right;  . LAPAROSCOPIC APPENDECTOMY N/A 10/31/2014   Procedure: APPENDECTOMY LAPAROSCOPIC;  Surgeon: Alphonsa Overall, MD;  Location: [facility] [place];  Service: General;  Laterality: N/A;  . LEFT HEART CATH AND CORONARY ANGIOGRAPHY N/A 09/06/2018   Procedure: LEFT HEART CATH AND CORONARY ANGIOGRAPHY;  Surgeon: Troy Sine, MD;  Location: [place] [facility] [facility];  Service:  Cardiovascular;  Laterality: N/A;  . SALPINGOOPHORECTOMY Right 1999   "w/grapefruit-sized polyp"  . SHOULDER ARTHROSCOPY Right 2003   removal spurs  . SHOULDER ARTHROSCOPY W/ ROTATOR CUFF REPAIR Left 2012  . SINUS EXPLORATION  12/2013   [place]  . TENDON REPAIR Left 10/2009   torn tendon @ elbow  . VAGINAL HYSTERECTOMY  1978    MEDICATIONS: . albuterol (PROVENTIL HFA;VENTOLIN HFA) 108 (90 Base) MCG/ACT inhaler  . albuterol (PROVENTIL) (2.5 MG/3ML) 0.083% nebulizer solution  . alum & mag hydroxide-simeth (MAALOX/MYLANTA) 200-200-20 MG/5ML suspension  . aspirin EC 81 MG tablet  . buPROPion (WELLBUTRIN XL) 150 MG 24 hr tablet  . cetirizine (ZYRTEC) 10 MG tablet  . Cholecalciferol (VITAMIN D-3) 5000 UNITS TABS  . Coenzyme Q10 (COQ10) 200 MG CAPS  . diclofenac sodium (VOLTAREN) 1 % GEL  . diphenhydrAMINE (BENADRYL) 25 MG tablet  . estradiol (ESTRACE) 1 MG tablet  . ibuprofen (ADVIL,MOTRIN) 200 MG tablet  . metoprolol succinate (TOPROL XL) 25 MG 24 hr tablet  . montelukast (SINGULAIR) 10 MG tablet  . rosuvastatin (CRESTOR) 40 MG tablet  . sertraline (ZOLOFT) 100 MG tablet  . umeclidinium-vilanterol (ANORO ELLIPTA) 62.5-25 MCG/INH AEPB   No current facility-administered medications for this encounter.     Myra Gianotti, PA-C Surgical Short Stay/Anesthesiology [facility] Phone [phone] [facility] Phone [phone] 09/27/2018 11:19 AM

## 2018-09-27 NOTE — Anesthesia Preprocedure Evaluation (Addendum)
Anesthesia Evaluation  Patient identified by MRN, date of birth, ID band Patient awake    Reviewed: Allergy & Precautions, NPO status , Patient's Chart, lab work & pertinent test results, reviewed documented beta blocker date and time   Airway Mallampati: III  TM Distance: >3 FB Neck ROM: Full    Dental no notable dental hx. (+) Teeth Intact, Dental Advisory Given   Pulmonary COPD,  COPD inhaler, Current Smoker,    Pulmonary exam normal breath sounds clear to auscultation       Cardiovascular + CAD  Normal cardiovascular exam Rhythm:Regular Rate:Normal  Per cardiologist Dr. Claiborne Billings, "Agree, okay to proceed with surgery. Continue aspirin."   Neuro/Psych  Headaches, Seizures -, Well Controlled,  PSYCHIATRIC DISORDERS Depression    GI/Hepatic Neg liver ROS, GERD  Medicated and Controlled,  Endo/Other  negative endocrine ROS  Renal/GU negative Renal ROS     Musculoskeletal negative musculoskeletal ROS (+)   Abdominal (+) + obese,   Peds  Hematology HLD   Anesthesia Other Findings LUNG NODULES  Reproductive/Obstetrics                          Anesthesia Physical Anesthesia Plan  ASA: III  Anesthesia Plan: General   Post-op Pain Management:    Induction: Intravenous  PONV Risk Score and Plan: 3 and Midazolam, Dexamethasone, Ondansetron and Treatment may vary due to age or medical condition  Airway Management Planned: Double Lumen EBT and Oral ETT  Additional Equipment: Arterial line, CVP and Ultrasound Guidance Line Placement  Intra-op Plan:   Post-operative Plan: Extubation in OR and Possible Post-op intubation/ventilation  Informed Consent: I have reviewed the patients History and Physical, chart, labs and discussed the procedure including the risks, benefits and alternatives for the proposed anesthesia with the patient or authorized representative who has indicated his/her understanding  and acceptance.     Dental advisory given  Plan Discussed with: CRNA  Anesthesia Plan Comments: (Reviewed PAT note written 09/27/2018 by Myra Gianotti, PA-C. )     Anesthesia Quick Evaluation

## 2018-09-28 ENCOUNTER — Inpatient Hospital Stay (HOSPITAL_COMMUNITY): Payer: Medicare Other

## 2018-09-28 ENCOUNTER — Inpatient Hospital Stay (HOSPITAL_COMMUNITY): Payer: Medicare Other | Admitting: Certified Registered"

## 2018-09-28 ENCOUNTER — Encounter (HOSPITAL_COMMUNITY)
Admission: RE | Disposition: A | Discharge status: Home / Self Care | Payer: Self-pay | Source: Home / Self Care | Attending: Cardiothoracic Surgery

## 2018-09-28 ENCOUNTER — Other Ambulatory Visit: Payer: Self-pay | Admitting: *Deleted

## 2018-09-28 ENCOUNTER — Inpatient Hospital Stay (HOSPITAL_COMMUNITY)
Admission: RE | Admit: 2018-09-28 | Discharge: 2018-10-06 | DRG: 164 | Disposition: A | Discharge status: Home / Self Care | Payer: Medicare Other | Attending: Cardiothoracic Surgery | Admitting: Cardiothoracic Surgery

## 2018-09-28 ENCOUNTER — Other Ambulatory Visit: Payer: Self-pay

## 2018-09-28 ENCOUNTER — Inpatient Hospital Stay (HOSPITAL_COMMUNITY): Payer: Medicare Other | Admitting: Vascular Surgery

## 2018-09-28 ENCOUNTER — Encounter (HOSPITAL_COMMUNITY): Payer: Self-pay | Admitting: Surgery

## 2018-09-28 DIAGNOSIS — K219 Gastro-esophageal reflux disease without esophagitis: Secondary | ICD-10-CM | POA: Diagnosis not present

## 2018-09-28 DIAGNOSIS — F319 Bipolar disorder, unspecified: Secondary | ICD-10-CM | POA: Diagnosis present

## 2018-09-28 DIAGNOSIS — J309 Allergic rhinitis, unspecified: Secondary | ICD-10-CM | POA: Diagnosis present

## 2018-09-28 DIAGNOSIS — J95812 Postprocedural air leak: Secondary | ICD-10-CM | POA: Diagnosis not present

## 2018-09-28 DIAGNOSIS — I2510 Atherosclerotic heart disease of native coronary artery without angina pectoris: Secondary | ICD-10-CM | POA: Diagnosis not present

## 2018-09-28 DIAGNOSIS — Z800 Family history of malignant neoplasm of digestive organs: Secondary | ICD-10-CM

## 2018-09-28 DIAGNOSIS — C3490 Malignant neoplasm of unspecified part of unspecified bronchus or lung: Secondary | ICD-10-CM | POA: Diagnosis present

## 2018-09-28 DIAGNOSIS — Z8371 Family history of colonic polyps: Secondary | ICD-10-CM

## 2018-09-28 DIAGNOSIS — Z79899 Other long term (current) drug therapy: Secondary | ICD-10-CM

## 2018-09-28 DIAGNOSIS — E559 Vitamin D deficiency, unspecified: Secondary | ICD-10-CM | POA: Diagnosis not present

## 2018-09-28 DIAGNOSIS — T797XXA Traumatic subcutaneous emphysema, initial encounter: Secondary | ICD-10-CM | POA: Diagnosis not present

## 2018-09-28 DIAGNOSIS — Z9689 Presence of other specified functional implants: Secondary | ICD-10-CM

## 2018-09-28 DIAGNOSIS — E785 Hyperlipidemia, unspecified: Secondary | ICD-10-CM | POA: Diagnosis present

## 2018-09-28 DIAGNOSIS — Z86010 Personal history of colonic polyps: Secondary | ICD-10-CM | POA: Diagnosis not present

## 2018-09-28 DIAGNOSIS — Z823 Family history of stroke: Secondary | ICD-10-CM | POA: Diagnosis not present

## 2018-09-28 DIAGNOSIS — Z808 Family history of malignant neoplasm of other organs or systems: Secondary | ICD-10-CM | POA: Diagnosis not present

## 2018-09-28 DIAGNOSIS — D62 Acute posthemorrhagic anemia: Secondary | ICD-10-CM | POA: Diagnosis not present

## 2018-09-28 DIAGNOSIS — I10 Essential (primary) hypertension: Secondary | ICD-10-CM | POA: Diagnosis present

## 2018-09-28 DIAGNOSIS — F17210 Nicotine dependence, cigarettes, uncomplicated: Secondary | ICD-10-CM | POA: Diagnosis present

## 2018-09-28 DIAGNOSIS — R112 Nausea with vomiting, unspecified: Secondary | ICD-10-CM | POA: Diagnosis not present

## 2018-09-28 DIAGNOSIS — I440 Atrioventricular block, first degree: Secondary | ICD-10-CM | POA: Diagnosis present

## 2018-09-28 DIAGNOSIS — R7303 Prediabetes: Secondary | ICD-10-CM | POA: Diagnosis not present

## 2018-09-28 DIAGNOSIS — Z833 Family history of diabetes mellitus: Secondary | ICD-10-CM | POA: Diagnosis not present

## 2018-09-28 DIAGNOSIS — J9811 Atelectasis: Secondary | ICD-10-CM | POA: Diagnosis not present

## 2018-09-28 DIAGNOSIS — C3492 Malignant neoplasm of unspecified part of left bronchus or lung: Secondary | ICD-10-CM | POA: Diagnosis not present

## 2018-09-28 DIAGNOSIS — E669 Obesity, unspecified: Secondary | ICD-10-CM | POA: Diagnosis present

## 2018-09-28 DIAGNOSIS — J449 Chronic obstructive pulmonary disease, unspecified: Secondary | ICD-10-CM | POA: Diagnosis present

## 2018-09-28 DIAGNOSIS — C3412 Malignant neoplasm of upper lobe, left bronchus or lung: Principal | ICD-10-CM | POA: Diagnosis present

## 2018-09-28 DIAGNOSIS — D649 Anemia, unspecified: Secondary | ICD-10-CM | POA: Diagnosis present

## 2018-09-28 DIAGNOSIS — R918 Other nonspecific abnormal finding of lung field: Secondary | ICD-10-CM | POA: Diagnosis not present

## 2018-09-28 DIAGNOSIS — R569 Unspecified convulsions: Secondary | ICD-10-CM | POA: Diagnosis not present

## 2018-09-28 DIAGNOSIS — Z8349 Family history of other endocrine, nutritional and metabolic diseases: Secondary | ICD-10-CM | POA: Diagnosis not present

## 2018-09-28 DIAGNOSIS — Z888 Allergy status to other drugs, medicaments and biological substances status: Secondary | ICD-10-CM

## 2018-09-28 DIAGNOSIS — Z803 Family history of malignant neoplasm of breast: Secondary | ICD-10-CM

## 2018-09-28 DIAGNOSIS — J95811 Postprocedural pneumothorax: Secondary | ICD-10-CM | POA: Diagnosis not present

## 2018-09-28 DIAGNOSIS — J939 Pneumothorax, unspecified: Secondary | ICD-10-CM | POA: Diagnosis not present

## 2018-09-28 DIAGNOSIS — Z4682 Encounter for fitting and adjustment of non-vascular catheter: Secondary | ICD-10-CM | POA: Diagnosis not present

## 2018-09-28 DIAGNOSIS — Z6831 Body mass index (BMI) 31.0-31.9, adult: Secondary | ICD-10-CM

## 2018-09-28 DIAGNOSIS — R682 Dry mouth, unspecified: Secondary | ICD-10-CM | POA: Diagnosis not present

## 2018-09-28 HISTORY — PX: VIDEO BRONCHOSCOPY: SHX5072

## 2018-09-28 HISTORY — DX: Malignant neoplasm of unspecified part of unspecified bronchus or lung: C34.90

## 2018-09-28 HISTORY — PX: VIDEO ASSISTED THORACOSCOPY (VATS)/WEDGE RESECTION: SHX6174

## 2018-09-28 LAB — GLUCOSE, CAPILLARY: Glucose-Capillary: 119 mg/dL — ABNORMAL HIGH (ref 70–99)

## 2018-09-28 SURGERY — BRONCHOSCOPY, VIDEO-ASSISTED
Anesthesia: General | Site: Chest

## 2018-09-28 MED ORDER — OXYCODONE HCL 5 MG PO TABS
5.0000 mg | ORAL_TABLET | ORAL | Status: DC | PRN
  Administered 2018-09-28 – 2018-10-05 (×12): 5 mg via ORAL
  Filled 2018-09-28 (×13): qty 1

## 2018-09-28 MED ORDER — MIDAZOLAM HCL 5 MG/5ML IJ SOLN
INTRAMUSCULAR | Status: DC | PRN
  Administered 2018-09-28: 2 mg via INTRAVENOUS

## 2018-09-28 MED ORDER — CEFAZOLIN SODIUM-DEXTROSE 2-4 GM/100ML-% IV SOLN
2.0000 g | Freq: Three times a day (TID) | INTRAVENOUS | Status: AC
  Administered 2018-09-28 (×2): 2 g via INTRAVENOUS
  Filled 2018-09-28 (×2): qty 100

## 2018-09-28 MED ORDER — ONDANSETRON HCL 4 MG/2ML IJ SOLN: 4.0000 mg | Freq: Once | INTRAMUSCULAR | Status: DC | PRN

## 2018-09-28 MED ORDER — ESTRADIOL 1 MG PO TABS
1.0000 mg | ORAL_TABLET | Freq: Every day | ORAL | Status: DC
  Administered 2018-09-29 – 2018-10-06 (×8): 1 mg via ORAL
  Filled 2018-09-28 (×9): qty 1

## 2018-09-28 MED ORDER — LEVALBUTEROL HCL 0.63 MG/3ML IN NEBU: 0.6300 mg | INHALATION_SOLUTION | Freq: Three times a day (TID) | RESPIRATORY_TRACT | Status: DC | PRN

## 2018-09-28 MED ORDER — PROPOFOL 10 MG/ML IV BOLUS
INTRAVENOUS | Status: AC
  Filled 2018-09-28: qty 20

## 2018-09-28 MED ORDER — POTASSIUM CHLORIDE IN NACL 20-0.9 MEQ/L-% IV SOLN
INTRAVENOUS | Status: DC
  Administered 2018-09-28 – 2018-10-01 (×3): via INTRAVENOUS
  Filled 2018-09-28 (×4): qty 1000

## 2018-09-28 MED ORDER — SUGAMMADEX SODIUM 200 MG/2ML IV SOLN
INTRAVENOUS | Status: DC | PRN
  Administered 2018-09-28: 200 mg via INTRAVENOUS

## 2018-09-28 MED ORDER — UMECLIDINIUM-VILANTEROL 62.5-25 MCG/INH IN AEPB
1.0000 | INHALATION_SPRAY | Freq: Every day | RESPIRATORY_TRACT | Status: DC
  Administered 2018-09-29 – 2018-10-05 (×7): 1 via RESPIRATORY_TRACT
  Filled 2018-09-28 (×2): qty 14

## 2018-09-28 MED ORDER — PROPOFOL 10 MG/ML IV BOLUS
INTRAVENOUS | Status: DC | PRN
  Administered 2018-09-28: 50 mg via INTRAVENOUS
  Administered 2018-09-28: 100 mg via INTRAVENOUS

## 2018-09-28 MED ORDER — ENOXAPARIN SODIUM 40 MG/0.4ML ~~LOC~~ SOLN
40.0000 mg | Freq: Two times a day (BID) | SUBCUTANEOUS | Status: DC
  Administered 2018-09-28 – 2018-09-29 (×2): 40 mg via SUBCUTANEOUS
  Filled 2018-09-28 (×2): qty 0.4

## 2018-09-28 MED ORDER — SERTRALINE HCL 100 MG PO TABS
100.0000 mg | ORAL_TABLET | Freq: Every day | ORAL | Status: DC
  Administered 2018-09-29 – 2018-10-06 (×8): 100 mg via ORAL
  Filled 2018-09-28 (×9): qty 1

## 2018-09-28 MED ORDER — CEFAZOLIN SODIUM-DEXTROSE 2-4 GM/100ML-% IV SOLN
2.0000 g | INTRAVENOUS | Status: AC
  Administered 2018-09-28: 2 g via INTRAVENOUS

## 2018-09-28 MED ORDER — FENTANYL CITRATE (PF) 250 MCG/5ML IJ SOLN
INTRAMUSCULAR | Status: AC
  Filled 2018-09-28: qty 5

## 2018-09-28 MED ORDER — GLYCOPYRROLATE PF 0.2 MG/ML IJ SOSY
PREFILLED_SYRINGE | INTRAMUSCULAR | Status: DC | PRN
  Administered 2018-09-28: .1 mg via INTRAVENOUS

## 2018-09-28 MED ORDER — ACETAMINOPHEN 500 MG PO TABS
1000.0000 mg | ORAL_TABLET | Freq: Once | ORAL | Status: AC
  Administered 2018-09-28: 1000 mg via ORAL

## 2018-09-28 MED ORDER — METOPROLOL SUCCINATE ER 25 MG PO TB24
25.0000 mg | ORAL_TABLET | Freq: Every day | ORAL | Status: DC
  Filled 2018-09-28: qty 1

## 2018-09-28 MED ORDER — ROCURONIUM BROMIDE 50 MG/5ML IV SOSY
PREFILLED_SYRINGE | INTRAVENOUS | Status: AC
  Filled 2018-09-28: qty 10

## 2018-09-28 MED ORDER — DIPHENHYDRAMINE HCL 12.5 MG/5ML PO ELIX
12.5000 mg | ORAL_SOLUTION | Freq: Four times a day (QID) | ORAL | Status: DC | PRN
  Filled 2018-09-28: qty 5

## 2018-09-28 MED ORDER — EPHEDRINE SULFATE-NACL 50-0.9 MG/10ML-% IV SOSY
PREFILLED_SYRINGE | INTRAVENOUS | Status: DC | PRN
  Administered 2018-09-28: 5 mg via INTRAVENOUS
  Administered 2018-09-28 (×2): 10 mg via INTRAVENOUS
  Administered 2018-09-28: 5 mg via INTRAVENOUS

## 2018-09-28 MED ORDER — LACTATED RINGERS IV SOLN
INTRAVENOUS | Status: DC | PRN
  Administered 2018-09-28 (×2): via INTRAVENOUS

## 2018-09-28 MED ORDER — ROCURONIUM BROMIDE 50 MG/5ML IV SOSY
PREFILLED_SYRINGE | INTRAVENOUS | Status: DC | PRN
  Administered 2018-09-28: 10 mg via INTRAVENOUS
  Administered 2018-09-28: 20 mg via INTRAVENOUS
  Administered 2018-09-28: 50 mg via INTRAVENOUS
  Administered 2018-09-28: 20 mg via INTRAVENOUS

## 2018-09-28 MED ORDER — LORATADINE 10 MG PO TABS
10.0000 mg | ORAL_TABLET | Freq: Every day | ORAL | Status: DC
  Administered 2018-09-29 – 2018-10-06 (×8): 10 mg via ORAL
  Filled 2018-09-28 (×9): qty 1

## 2018-09-28 MED ORDER — BUPIVACAINE HCL (PF) 0.5 % IJ SOLN
INTRAMUSCULAR | Status: AC
  Filled 2018-09-28: qty 30

## 2018-09-28 MED ORDER — BISACODYL 5 MG PO TBEC
10.0000 mg | DELAYED_RELEASE_TABLET | Freq: Every day | ORAL | Status: DC
  Administered 2018-09-28 – 2018-10-05 (×6): 10 mg via ORAL
  Filled 2018-09-28 (×7): qty 2

## 2018-09-28 MED ORDER — FENTANYL CITRATE (PF) 100 MCG/2ML IJ SOLN
INTRAMUSCULAR | Status: AC
  Administered 2018-09-28: 50 ug via INTRAVENOUS
  Filled 2018-09-28: qty 2

## 2018-09-28 MED ORDER — FENTANYL 40 MCG/ML IV SOLN
INTRAVENOUS | Status: DC
  Administered 2018-09-28: 12:00:00 via INTRAVENOUS
  Administered 2018-09-28: 140 ug via INTRAVENOUS
  Administered 2018-09-29: 60 ug via INTRAVENOUS
  Administered 2018-09-29: 130 ug via INTRAVENOUS
  Administered 2018-09-29: 100 ug via INTRAVENOUS
  Administered 2018-09-29: 1000 ug via INTRAVENOUS
  Administered 2018-09-29: 100 ug via INTRAVENOUS
  Administered 2018-09-30: 70 ug via INTRAVENOUS
  Administered 2018-09-30: 60 ug via INTRAVENOUS
  Administered 2018-10-01 (×2): 50 ug via INTRAVENOUS
  Administered 2018-10-01: 09:00:00 via INTRAVENOUS
  Filled 2018-09-28: qty 25
  Filled 2018-09-28 (×2): qty 1000

## 2018-09-28 MED ORDER — 0.9 % SODIUM CHLORIDE (POUR BTL) OPTIME
TOPICAL | Status: DC | PRN
  Administered 2018-09-28: 1000 mL

## 2018-09-28 MED ORDER — BUPROPION HCL ER (XL) 150 MG PO TB24
150.0000 mg | ORAL_TABLET | Freq: Every day | ORAL | Status: DC
  Administered 2018-09-29 – 2018-10-06 (×8): 150 mg via ORAL
  Filled 2018-09-28 (×8): qty 1

## 2018-09-28 MED ORDER — DEXAMETHASONE SODIUM PHOSPHATE 10 MG/ML IJ SOLN
INTRAMUSCULAR | Status: DC | PRN
  Administered 2018-09-28: 10 mg via INTRAVENOUS

## 2018-09-28 MED ORDER — ONDANSETRON HCL 4 MG/2ML IJ SOLN
INTRAMUSCULAR | Status: DC | PRN
  Administered 2018-09-28: 4 mg via INTRAVENOUS

## 2018-09-28 MED ORDER — FENTANYL CITRATE (PF) 100 MCG/2ML IJ SOLN
25.0000 ug | INTRAMUSCULAR | Status: DC | PRN
  Administered 2018-09-28 (×2): 50 ug via INTRAVENOUS

## 2018-09-28 MED ORDER — MONTELUKAST SODIUM 10 MG PO TABS
10.0000 mg | ORAL_TABLET | Freq: Every day | ORAL | Status: DC
  Administered 2018-09-29 – 2018-10-05 (×8): 10 mg via ORAL
  Filled 2018-09-28 (×8): qty 1

## 2018-09-28 MED ORDER — TRAMADOL HCL 50 MG PO TABS: 50.0000 mg | ORAL_TABLET | Freq: Four times a day (QID) | ORAL | Status: DC | PRN

## 2018-09-28 MED ORDER — PHENYLEPHRINE 40 MCG/ML (10ML) SYRINGE FOR IV PUSH (FOR BLOOD PRESSURE SUPPORT)
PREFILLED_SYRINGE | INTRAVENOUS | Status: DC | PRN
  Administered 2018-09-28: 40 ug via INTRAVENOUS

## 2018-09-28 MED ORDER — LACTATED RINGERS IV SOLN
INTRAVENOUS | Status: DC | PRN
  Administered 2018-09-28: 07:00:00 via INTRAVENOUS

## 2018-09-28 MED ORDER — ONDANSETRON HCL 4 MG/2ML IJ SOLN
INTRAMUSCULAR | Status: AC
  Filled 2018-09-28: qty 2

## 2018-09-28 MED ORDER — DIPHENHYDRAMINE HCL 25 MG PO CAPS
25.0000 mg | ORAL_CAPSULE | Freq: Every day | ORAL | Status: DC
  Administered 2018-09-28 – 2018-10-06 (×9): 25 mg via ORAL
  Filled 2018-09-28 (×10): qty 1

## 2018-09-28 MED ORDER — ROSUVASTATIN CALCIUM 20 MG PO TABS
40.0000 mg | ORAL_TABLET | Freq: Every day | ORAL | Status: DC
  Administered 2018-09-29 – 2018-10-06 (×8): 40 mg via ORAL
  Filled 2018-09-28 (×9): qty 2

## 2018-09-28 MED ORDER — MIDAZOLAM HCL 2 MG/2ML IJ SOLN
INTRAMUSCULAR | Status: AC
  Filled 2018-09-28: qty 2

## 2018-09-28 MED ORDER — NALOXONE HCL 0.4 MG/ML IJ SOLN: 0.4000 mg | INTRAMUSCULAR | Status: DC | PRN

## 2018-09-28 MED ORDER — POTASSIUM CHLORIDE 10 MEQ/50ML IV SOLN: 10.0000 meq | Freq: Every day | INTRAVENOUS | Status: DC | PRN

## 2018-09-28 MED ORDER — INSULIN ASPART 100 UNIT/ML ~~LOC~~ SOLN: 0.0000 [IU] | Freq: Four times a day (QID) | SUBCUTANEOUS | Status: DC

## 2018-09-28 MED ORDER — SENNOSIDES-DOCUSATE SODIUM 8.6-50 MG PO TABS
1.0000 | ORAL_TABLET | Freq: Every day | ORAL | Status: DC
  Administered 2018-09-28 – 2018-09-29 (×2): 1 via ORAL
  Filled 2018-09-28 (×7): qty 1

## 2018-09-28 MED ORDER — EPHEDRINE 5 MG/ML INJ
INTRAVENOUS | Status: AC
  Filled 2018-09-28: qty 10

## 2018-09-28 MED ORDER — SODIUM CHLORIDE 0.9% FLUSH: 9.0000 mL | INTRAVENOUS | Status: DC | PRN

## 2018-09-28 MED ORDER — ACETAMINOPHEN 160 MG/5ML PO SOLN: 1000.0000 mg | Freq: Four times a day (QID) | ORAL | Status: AC

## 2018-09-28 MED ORDER — LIDOCAINE 2% (20 MG/ML) 5 ML SYRINGE
INTRAMUSCULAR | Status: DC | PRN
  Administered 2018-09-28: 100 mg via INTRAVENOUS

## 2018-09-28 MED ORDER — SODIUM CHLORIDE (PF) 0.9 % IJ SOLN
INTRAMUSCULAR | Status: DC | PRN
  Administered 2018-09-28: 50 mL via INTRAVENOUS

## 2018-09-28 MED ORDER — ACETAMINOPHEN 500 MG PO TABS
1000.0000 mg | ORAL_TABLET | Freq: Four times a day (QID) | ORAL | Status: AC
  Administered 2018-09-28 – 2018-10-03 (×17): 1000 mg via ORAL
  Filled 2018-09-28 (×17): qty 2

## 2018-09-28 MED ORDER — DEXAMETHASONE SODIUM PHOSPHATE 10 MG/ML IJ SOLN
INTRAMUSCULAR | Status: AC
  Filled 2018-09-28: qty 1

## 2018-09-28 MED ORDER — DIPHENHYDRAMINE HCL 50 MG/ML IJ SOLN: 12.5000 mg | Freq: Four times a day (QID) | INTRAMUSCULAR | Status: DC | PRN

## 2018-09-28 MED ORDER — ASPIRIN EC 81 MG PO TBEC
81.0000 mg | DELAYED_RELEASE_TABLET | Freq: Every day | ORAL | Status: DC
  Administered 2018-09-29 – 2018-10-06 (×8): 81 mg via ORAL
  Filled 2018-09-28 (×9): qty 1

## 2018-09-28 MED ORDER — BUPIVACAINE HCL (PF) 0.5 % IJ SOLN
INTRAMUSCULAR | Status: DC | PRN
  Administered 2018-09-28: 30 mL

## 2018-09-28 MED ORDER — FENTANYL CITRATE (PF) 100 MCG/2ML IJ SOLN
INTRAMUSCULAR | Status: DC | PRN
  Administered 2018-09-28: 100 ug via INTRAVENOUS
  Administered 2018-09-28 (×2): 50 ug via INTRAVENOUS
  Administered 2018-09-28: 100 ug via INTRAVENOUS
  Administered 2018-09-28: 50 ug via INTRAVENOUS
  Administered 2018-09-28: 100 ug via INTRAVENOUS

## 2018-09-28 MED ORDER — GLYCOPYRROLATE PF 0.2 MG/ML IJ SOSY
PREFILLED_SYRINGE | INTRAMUSCULAR | Status: AC
  Filled 2018-09-28: qty 1

## 2018-09-28 MED ORDER — PHENYLEPHRINE 40 MCG/ML (10ML) SYRINGE FOR IV PUSH (FOR BLOOD PRESSURE SUPPORT)
PREFILLED_SYRINGE | INTRAVENOUS | Status: AC
  Filled 2018-09-28: qty 10

## 2018-09-28 MED ORDER — ONDANSETRON HCL 4 MG/2ML IJ SOLN: 4.0000 mg | Freq: Four times a day (QID) | INTRAMUSCULAR | Status: DC | PRN

## 2018-09-28 MED ORDER — ALBUMIN HUMAN 5 % IV SOLN
INTRAVENOUS | Status: DC | PRN
  Administered 2018-09-28: 10:00:00 via INTRAVENOUS

## 2018-09-28 MED ORDER — BUPIVACAINE LIPOSOME 1.3 % IJ SUSP
20.0000 mL | INTRAMUSCULAR | Status: AC
  Administered 2018-09-28: 20 mL
  Filled 2018-09-28: qty 20

## 2018-09-28 MED ORDER — LIDOCAINE 2% (20 MG/ML) 5 ML SYRINGE
INTRAMUSCULAR | Status: AC
  Filled 2018-09-28: qty 5

## 2018-09-28 SURGICAL SUPPLY — 100 items
ADAPTER VALVE BIOPSY EBUS (MISCELLANEOUS) IMPLANT
ADPTR VALVE BIOPSY EBUS (MISCELLANEOUS)
APPLICATOR TIP COSEAL (VASCULAR PRODUCTS) IMPLANT
APPLICATOR TIP EXT COSEAL (VASCULAR PRODUCTS) IMPLANT
BRUSH CYTOL CELLEBRITY 1.5X140 (MISCELLANEOUS) IMPLANT
CANISTER SUCT 3000ML PPV (MISCELLANEOUS) ×3 IMPLANT
CATH THORACIC 28FR (CATHETERS) IMPLANT
CATH THORACIC 36FR (CATHETERS) IMPLANT
CATH THORACIC 36FR RT ANG (CATHETERS) IMPLANT
CLIP VESOCCLUDE MED 24/CT (CLIP) ×1 IMPLANT
CLIP VESOCCLUDE MED 6/CT (CLIP) ×3 IMPLANT
CLIP VESOCCLUDE SM WIDE 24/CT (CLIP) ×1 IMPLANT
CONN ST 1/4X3/8  BEN (MISCELLANEOUS)
CONN ST 1/4X3/8 BEN (MISCELLANEOUS) IMPLANT
CONT SPEC 4OZ CLIKSEAL STRL BL (MISCELLANEOUS) ×6 IMPLANT
COVER BACK TABLE 60X90IN (DRAPES) ×3 IMPLANT
COVER WAND RF STERILE (DRAPES) ×3 IMPLANT
DERMABOND ADHESIVE PROPEN (GAUZE/BANDAGES/DRESSINGS) ×1
DERMABOND ADVANCED (GAUZE/BANDAGES/DRESSINGS)
DERMABOND ADVANCED .7 DNX12 (GAUZE/BANDAGES/DRESSINGS) IMPLANT
DERMABOND ADVANCED .7 DNX6 (GAUZE/BANDAGES/DRESSINGS) IMPLANT
DISSECTOR BLUNT TIP ENDO 5MM (MISCELLANEOUS) IMPLANT
DRAIN CHANNEL 28F RND 3/8 FF (WOUND CARE) IMPLANT
DRAIN CHANNEL 32F RND 10.7 FF (WOUND CARE) IMPLANT
DRAPE LAPAROSCOPIC ABDOMINAL (DRAPES) ×3 IMPLANT
DRILL BIT 7/64X5 (BIT) IMPLANT
ELECT BLADE 4.0 EZ CLEAN MEGAD (MISCELLANEOUS) ×3
ELECT BLADE 6.5 EXT (BLADE) ×4 IMPLANT
ELECT REM PT RETURN 9FT ADLT (ELECTROSURGICAL) ×3
ELECTRODE BLDE 4.0 EZ CLN MEGD (MISCELLANEOUS) ×2 IMPLANT
ELECTRODE REM PT RTRN 9FT ADLT (ELECTROSURGICAL) ×2 IMPLANT
FORCEPS BIOP RJ4 1.8 (CUTTING FORCEPS) IMPLANT
GAUZE SPONGE 4X4 12PLY STRL (GAUZE/BANDAGES/DRESSINGS) ×3 IMPLANT
GLOVE BIO SURGEON STRL SZ 6.5 (GLOVE) ×6 IMPLANT
GOWN STRL REUS W/ TWL LRG LVL3 (GOWN DISPOSABLE) ×8 IMPLANT
GOWN STRL REUS W/TWL LRG LVL3 (GOWN DISPOSABLE) ×4
KIT BASIN OR (CUSTOM PROCEDURE TRAY) ×3 IMPLANT
KIT CLEAN ENDO COMPLIANCE (KITS) ×3 IMPLANT
KIT SUCTION CATH 14FR (SUCTIONS) ×3 IMPLANT
KIT TURNOVER KIT B (KITS) ×3 IMPLANT
MARKER SKIN DUAL TIP RULER LAB (MISCELLANEOUS) IMPLANT
NDL SPNL 18GX3.5 QUINCKE PK (NEEDLE) ×2 IMPLANT
NEEDLE SPNL 18GX3.5 QUINCKE PK (NEEDLE) ×3 IMPLANT
NS IRRIG 1000ML POUR BTL (IV SOLUTION) ×6 IMPLANT
OIL SILICONE PENTAX (PARTS (SERVICE/REPAIRS)) ×3 IMPLANT
PACK CHEST (CUSTOM PROCEDURE TRAY) ×3 IMPLANT
PAD ARMBOARD 7.5X6 YLW CONV (MISCELLANEOUS) ×9 IMPLANT
PASSER SUT SWANSON 36MM LOOP (INSTRUMENTS) ×1 IMPLANT
POUCH ENDO CATCH II 15MM (MISCELLANEOUS) ×3 IMPLANT
POUCH SPECIMEN RETRIEVAL 10MM (ENDOMECHANICALS) ×1 IMPLANT
RELOAD STAPLE 35X2.5 WHT THIN (STAPLE) IMPLANT
RELOAD STAPLE 45 4.1 GRN THCK (STAPLE) IMPLANT
RELOAD STAPLE 45 GOLD REG/THCK (STAPLE) IMPLANT
SCISSORS LAP 5X35 DISP (ENDOMECHANICALS) IMPLANT
SEALANT SURG COSEAL 4ML (VASCULAR PRODUCTS) IMPLANT
SEALANT SURG COSEAL 8ML (VASCULAR PRODUCTS) IMPLANT
SOLUTION ANTI FOG 6CC (MISCELLANEOUS) ×3 IMPLANT
SPONGE TONSIL TAPE 1 RFD (DISPOSABLE) IMPLANT
STAPLE RELOAD 2.5MM WHITE (STAPLE) ×18 IMPLANT
STAPLE RELOAD 45 GRN (STAPLE) ×4 IMPLANT
STAPLE RELOAD 45MM GOLD (STAPLE) ×12 IMPLANT
STAPLE RELOAD 45MM GREEN (STAPLE) ×2
STAPLER ECHELON POWERED (MISCELLANEOUS) ×1 IMPLANT
STOPCOCK 4 WAY LG BORE MALE ST (IV SETS) ×3 IMPLANT
SUT PROLENE 3 0 SH DA (SUTURE) IMPLANT
SUT PROLENE 4 0 RB 1 (SUTURE)
SUT PROLENE 4-0 RB1 .5 CRCL 36 (SUTURE) IMPLANT
SUT SILK  1 MH (SUTURE) ×4
SUT SILK 1 MH (SUTURE) ×8 IMPLANT
SUT SILK 1 TIES 10X30 (SUTURE) IMPLANT
SUT SILK 2 0 SH (SUTURE) IMPLANT
SUT SILK 2 0SH CR/8 30 (SUTURE) IMPLANT
SUT STEEL 1 (SUTURE) IMPLANT
SUT VIC AB 0 CTX 18 (SUTURE) ×3 IMPLANT
SUT VIC AB 1 CTX 18 (SUTURE) IMPLANT
SUT VIC AB 1 CTX 36 (SUTURE)
SUT VIC AB 1 CTX36XBRD ANBCTR (SUTURE) IMPLANT
SUT VIC AB 2-0 CTX 36 (SUTURE) IMPLANT
SUT VIC AB 3-0 SH 8-18 (SUTURE) IMPLANT
SUT VIC AB 3-0 X1 27 (SUTURE) IMPLANT
SUT VICRYL 0 UR6 27IN ABS (SUTURE) ×1 IMPLANT
SUT VICRYL 2 TP 1 (SUTURE) ×1 IMPLANT
SYR 20ML ECCENTRIC (SYRINGE) ×3 IMPLANT
SYR 5ML LL (SYRINGE) ×3 IMPLANT
SYRINGE 60CC LL (MISCELLANEOUS) ×3 IMPLANT
SYSTEM SAHARA CHEST DRAIN ATS (WOUND CARE) ×3 IMPLANT
TAPE CLOTH SURG 4X10 WHT LF (GAUZE/BANDAGES/DRESSINGS) ×1 IMPLANT
TAPE UMBILICAL COTTON 1/8X30 (MISCELLANEOUS) IMPLANT
TOWEL GREEN STERILE (TOWEL DISPOSABLE) ×3 IMPLANT
TOWEL GREEN STERILE FF (TOWEL DISPOSABLE) ×3 IMPLANT
TRAP SPECIMEN MUCOUS 40CC (MISCELLANEOUS) IMPLANT
TRAY FOLEY MTR SLVR 16FR STAT (SET/KITS/TRAYS/PACK) ×3 IMPLANT
TROCAR BLADELESS 12MM (ENDOMECHANICALS) IMPLANT
TROCAR XCEL 12X100 BLDLESS (ENDOMECHANICALS) IMPLANT
TUBE CONNECTING 20X1/4 (TUBING) ×3 IMPLANT
TUBING EXTENTION W/L.L. (IV SETS) ×3 IMPLANT
VALVE BIOPSY  SINGLE USE (MISCELLANEOUS) ×1
VALVE BIOPSY SINGLE USE (MISCELLANEOUS) ×2 IMPLANT
VALVE SUCTION BRONCHIO DISP (MISCELLANEOUS) ×3 IMPLANT
WATER STERILE IRR 1000ML POUR (IV SOLUTION) ×3 IMPLANT

## 2018-09-28 NOTE — Anesthesia Procedure Notes (Addendum)
Arterial Line Insertion Start/End3/09/2018 6:48 AM, 09/28/2018 6:50 AM Performed by: Wilburn Cornelia, CRNA, CRNA  Patient location: Pre-op. Preanesthetic checklist: patient identified, IV checked, risks and benefits discussed, surgical consent, monitors and equipment checked, pre-op evaluation and timeout performed Lidocaine 1% used for infiltration and patient sedated Right, radial was placed Catheter size: 20 G Hand hygiene performed  and maximum sterile barriers used   Attempts: 1 Procedure performed without using ultrasound guided technique. Following insertion, dressing applied and Biopatch. Post procedure assessment: normal  Patient tolerated the procedure well with no immediate complications.

## 2018-09-28 NOTE — Progress Notes (Signed)
Patient interviewed in the preop area. Patient able to confirm name, DOB, procedure including laterality, allergies, no metal in body, npo status and no pain at this time.  Leatha Gilding, RN

## 2018-09-28 NOTE — H&P (Signed)
[address]       [place] [address]             [phone]                    Jermika Moore [place] Record #[id number] Date of Birth: 19-Jan-1951  Referring: Vicie Mutters, PA-C Primary Care: [name], MD Primary Cardiologist: Shelva Majestic, MD Pulmonary: Dr Annamaria Boots  Chief Complaint:    Left Lung Mass  History of Present Illness:    Karen Barrera 68 y.o. female  Was  seen in the office for evaluation of left upper lobe cyt with increasing solid portion  along with 6 mm nodular density is slowly increased in size on serial CT scans. She recently had cardiac cath and evaluation by   Dr Eston Mould ts.  Patient has known underlying COPD followed by Dr Annamaria Boots .  She has been a long-term smoker more than 50 years 1 pack/day, notes that she is currently cut down to a third of a pack a day.   On CT scan she was noted to have significant calcification of her coronary arteries, underwent cardiac CT by Dr. Claiborne Billings and subsequent cardiac catheterization.-Proceeding with medical therapy for LAD disease.   Current Activity/ Functional Status:  Patient is independent with mobility/ambulation, transfers, ADL's, IADL's.   Zubrod Score: At the time of surgery this patient's most appropriate activity status/level should be described as: _0     0    Normal activity, no symptoms _1     1    Restricted in physical strenuous activity but ambulatory, able to do out light work _2     2    Ambulatory and capable of self care, unable to do work activities, up and about               >50 % of waking hours                              _3     3    Only limited self care, in bed greater than 50% of waking hours _4     4    Completely disabled, no self care, confined to bed or chair _5     5    Moribund   Past Medical History:  Diagnosis Date  . Acute perforated appendicitis 10/31/2014  . Adenomatous colon polyp   . Allergic rhinitis   . Allergy   . Anemia     . Arthritis    "fingers" (10/31/2014)  . Chronic bronchitis ([place])    "got it q yr til I had my nose OR" (10/31/2014)  . COPD (chronic obstructive pulmonary disease) ([place])   . Coronary artery disease   . DDD (degenerative disc disease), cervical   . DDD (degenerative disc disease), lumbar   . Depression    "short term when my sister passed away unexpectedly"  . Family history of adverse reaction to anesthesia    "mother is hard to wake up; a little goes a long way w/her"  . Family history of breast cancer   . Family history of colon cancer   . Family history of skin cancer   . GERD (gastroesophageal reflux disease)   . History of stomach ulcers   . Hyperlipidemia    "borderline; RX is preventative" (10/31/2014)  . Kidney stones    "they passed"  . Migraine    "  used to get them alot; don't get them anymore" (10/31/2014)  . Osteoarthritis of both knees   . Osteopenia   . Pneumonia 2003 X 1  . Pre-diabetes   . Seizures ([place]) ~ 1962 X 1   "from an abscessed wisdom tooth"  . Tobacco dependence   . Vitamin D deficiency     Past Surgical History:  Procedure Laterality Date  . APPENDECTOMY    . ARTHROTOMY Right 01/11/2016   Procedure: DISTAL INTERPHALANGEAL JOINT ARTHROTOMY RIGHT INDEX FINGER;  Surgeon: Leanora Cover, MD;  Location: [facility];  Service: Orthopedics;  Laterality: Right;  . COLONOSCOPY    . CYST EXCISION Right 01/11/2016   Procedure: EXCISION MUCOID CYST;  Surgeon: Leanora Cover, MD;  Location: [facility];  Service: Orthopedics;  Laterality: Right;  . LAPAROSCOPIC APPENDECTOMY N/A 10/31/2014   Procedure: APPENDECTOMY LAPAROSCOPIC;  Surgeon: Alphonsa Overall, MD;  Location: [facility];  Service: General;  Laterality: N/A;  . LEFT HEART CATH AND CORONARY ANGIOGRAPHY N/A 09/06/2018   Procedure: LEFT HEART CATH AND CORONARY ANGIOGRAPHY;  Surgeon: Troy Sine, MD;  Location: [facility];  Service: Cardiovascular;  Laterality: N/A;  . SALPINGOOPHORECTOMY  Right 1999   "w/grapefruit-sized polyp"  . SHOULDER ARTHROSCOPY Right 2003   removal spurs  . SHOULDER ARTHROSCOPY W/ ROTATOR CUFF REPAIR Left 2012  . SINUS EXPLORATION  12/2013   [facility]  . TENDON REPAIR Left 10/2009   torn tendon @ elbow  . VAGINAL HYSTERECTOMY  1978    Family History  Problem Relation Age of Onset  . Colon cancer Other        dx 40's/50's  . Colon cancer Other 19  . Diabetes Mother   . Skin cancer Mother 2       unsure what type  . Seizures Sister   . Stroke Maternal Grandmother   . Hyperlipidemia Maternal Grandfather   . Arthritis Paternal Grandmother   . Melanoma Paternal Grandmother 46  . Colon polyps Father        has had about 5 every c-scope (every 5 years-ish)  . Colon cancer Other 44  . Cancer Other        type unknown  . Breast cancer Other 40  . Breast cancer Other   . Stomach cancer Neg Hx   . Esophageal cancer Neg Hx   . Rectal cancer Neg Hx      Social History   Tobacco Use  Smoking Status Current Every Day Smoker  . Packs/day: 0.25  . Years: 25.00  . Pack years: 6.25  . Types: Cigarettes  Smokeless Tobacco Never Used  Tobacco Comment   Pt currently smoking .75ppd as of 06/05/17    Social History   Substance and Sexual Activity  Alcohol Use No  . Alcohol/week: 0.0 standard drinks     Allergies  Allergen Reactions  . Chantix [Varenicline] Anaphylaxis and Swelling    Makes throat swell up  . Moxifloxacin Hcl In Nacl Other (See Comments)    aches    Current Facility-Administered Medications  Medication Dose Route Frequency Provider Last Rate Last Dose  . ceFAZolin (ANCEF) IVPB 2g/100 mL premix  2 g Intravenous 30 min Pre-Op Grace Isaac, MD       Facility-Administered Medications Ordered in Other Encounters  Medication Dose Route Frequency Provider Last Rate Last Dose  . fentaNYL (SUBLIMAZE) injection   Intravenous Anesthesia Intra-op Orlie Dakin, CRNA   50 mcg at 09/28/18 [phone]  . lactated ringers  infusion    Intravenous Continuous PRN Ellender, Karyl Kinnier, MD      . midazolam (VERSED) 5 MG/5ML injection   Intravenous Anesthesia Intra-op Orlie Dakin, CRNA   2 mg at 09/28/18 3329    Pertinent items are noted in HPI.   Review of Systems:     Cardiac Review of Systems: [Y] = yes  or   [ N ] = no   Chest Pain [ N  ]  Resting SOB Aqua.Slicker  ] Exertional SOB  [Y]  Orthopnea [  ]   Pedal Edema [ N ]    Palpitations Aqua.Slicker ] Syncope  Aqua.Slicker ]   Presyncope [   ]   General Review of Systems: [Y] = yes [  ]=no Constitional: recent weight change [ Y ];  Wt loss over the last 3 months [   ] anorexia [  ]; fatigue [  ]; nausea [  ]; night sweats [  ]; fever [  ]; or chills [  ];           Eye : blurred vision [  ]; diplopia [   ]; vision changes [  ];  Amaurosis fugax[  ]; Resp: cough [ Y ];  wheezing[  ];  hemoptysis[  ]; shortness of breath[  ]; paroxysmal nocturnal dyspnea[  ]; dyspnea on exertion[  ]; or orthopnea[  ];  GI:  gallstones[  ], vomiting[  ];  dysphagia[  ]; melena[  ];  hematochezia [  ]; heartburn[  ];   Hx of  Colonoscopy[  ]; GU: kidney stones [  ]; hematuria[  ];   dysuria [  ];  nocturia[  ];  history of     obstruction [  ]; urinary frequency [  ]             Skin: rash, swelling[  ];, hair loss[  ];  peripheral edema[  ];  or itching[  ]; Musculosketetal: myalgias[  ];  joint swelling[  ];  joint erythema[  ];  joint pain[  ];  back pain[  ];  Heme/Lymph: bruising[  ];  bleeding[  ];  anemia[  ];  Neuro: TIA[  ];  headaches[  ];  stroke[  ];  vertigo[  ];  seizures[  ];   paresthesias[  ];  difficulty walking[  ];  Psych:depression[  ]; anxiety[  ];  Endocrine: diabetes[  ];  thyroid dysfunction[  ];  Immunizations: Flu up to date [place]  ]; Pneumococcal up to date [ Y];  Other:     PHYSICAL EXAMINATION: BP 126/74   Pulse 62   Temp (!) 97.4 F (36.3 C) (Oral)   Resp 18   Ht _0  (1.6 m)   Wt 81.6 kg   SpO2 95%   BMI 31.89 kg/m  General appearance: alert, cooperative and no distress Head:  Normocephalic, without obvious abnormality, atraumatic Lymph nodes: Cervical, supraclavicular, and axillary nodes normal. Resp: clear to auscultation bilaterally Back: symmetric, no curvature. ROM normal. No CVA tenderness. Cardio: regular rate and rhythm, S1, S2 normal, no murmur, click, rub or gallop GI: soft, non-tender; bowel sounds normal; no masses,  no organomegaly Extremities: extremities normal, atraumatic, no cyanosis or edema Neurologic: Grossly normal   Diagnostic Studies & Laboratory data:     Recent Radiology Findings:  Dg Chest 2 View  Result Date: 09/25/2018 CLINICAL DATA:  Preoperative study or left upper lobe nodule removal. EXAM: CHEST - 2 VIEW COMPARISON:  PET-CT dated August 31, 2018. Chest x-ray dated June 05, 2017. FINDINGS: The heart size and mediastinal contours are within normal limits. Atherosclerotic calcification of the aortic arch. Normal pulmonary vascularity. Known left upper lobe lung nodule not well visualized by x-ray. No focal consolidation, pleural effusion, or pneumothorax. No acute osseous abnormality. IMPRESSION: 1.  No active cardiopulmonary disease. Electronically Signed   By: Titus Dubin M.D.   On: 09/25/2018 09:46    Nm Pet Image Initial (pi) Skull Base To Thigh  Result Date: 08/31/2018 CLINICAL DATA:  Initial treatment strategy for non-small-cell lung cancer. EXAM: NUCLEAR MEDICINE PET SKULL BASE TO THIGH TECHNIQUE: 8.6 mCi F-18 FDG was injected intravenously. Full-ring PET imaging was performed from the skull base to thigh after the radiotracer. CT data was obtained and used for attenuation correction and anatomic localization. Fasting blood glucose: 92 mg/dl COMPARISON:  Chest CT 08/17/2018, 03/01/2018 and 06/11/2017. FINDINGS: Mediastinal blood pool activity: SUV max 2.8 NECK: No hypermetabolic cervical lymph nodes are identified.There are no lesions of the pharyngeal mucosal space. Incidental CT findings: none CHEST: There are no  hypermetabolic mediastinal, hilar or axillary lymph nodes. There is low-level hypermetabolic activity associated with the soft tissue thickening along the posterior aspect of the left upper lobe cystic lesion. This has an SUV max of 1.9 and corresponds with soft tissue thickening measuring approximately 1.7 x 0.8 cm on image 22/8. No other hypermetabolic pulmonary activity. Incidental CT findings: Minimal left upper lobe nodularity on images 11 and 12 of series 8 is stable. Atherosclerosis of the aorta, great vessels and coronary arteries. ABDOMEN/PELVIS: There is no hypermetabolic activity within the liver, adrenal glands, spleen or pancreas. There is no hypermetabolic nodal activity. Incidental CT findings: There is diffuse hepatic steatosis. 5.9 x 4.1 cm cyst posteriorly in the right hepatic lobe (image 91/4) is stable without hypermetabolic activity. Stable 2.1 x 1.3 cm low-density left adrenal adenoma, without hypermetabolic activity. Diffuse aortic and branch vessel atherosclerosis. Postsurgical changes consistent with previous appendectomy and hysterectomy. SKELETON: There is no hypermetabolic activity to suggest osseous metastatic disease. Incidental CT findings: none IMPRESSION: 1. There is only low level hypermetabolic activity associated with the increasing peripheral nodularity around the left upper lobe bulla. However, this does not exclude the possibility of indolent neoplasm such as adenocarcinoma. I do not see histologic confirmation of non-small cell lung cancer in the patient's medical record. Referral to multidisciplinary thoracic Oncology Clinic recommended. 2. No evidence of metastatic disease. 3. Hepatic steatosis, cyst posteriorly in the right hepatic lobe and left adrenal adenoma. Electronically Signed   By: Richardean Sale M.D.   On: 08/31/2018 15:35     I have independently reviewed the above radiology studies  and reviewed the findings with the patient.   Recent Lab Findings: Lab  Results  Component Value Date   WBC 7.6 09/24/2018   HGB 14.3 09/24/2018   HCT 44.1 09/24/2018   PLT 179 09/24/2018   GLUCOSE 84 09/24/2018   CHOL 200 (H) 08/11/2018   TRIG 154 (H) 08/11/2018   HDL 76 08/11/2018   LDLCALC 98 08/11/2018   ALT 24 09/24/2018   AST 23 09/24/2018   NA 132 (L) 09/24/2018   K 4.3 09/24/2018   CL 104 09/24/2018   CREATININE 0.76 09/24/2018   BUN 13 09/24/2018   CO2 17 (L) 09/24/2018   TSH 2.10 08/11/2018   INR 1.1 09/24/2018   HGBA1C 5.9 (H) 08/11/2018   CLINICAL DATA:  Left upper lobe lung nodule. Current smoker without history  of primary malignancy.  EXAM: CT CHEST WITHOUT CONTRAST  TECHNIQUE: Multidetector CT imaging of the chest was performed following the standard protocol without IV contrast.  COMPARISON:  03/01/2018 diagnostic chest CT. Coronary CTA of 08/13/2018.  FINDINGS: Cardiovascular: Aortic and branch vessel atherosclerosis. Normal heart size, without pericardial effusion. Multivessel coronary artery atherosclerosis.  Mediastinum/Nodes: No mediastinal or definite hilar adenopathy, given limitations of unenhanced CT.  Lungs/Pleura: No pleural fluid. Mild centrilobular emphysema. Minimal posterior left apical nodularity including at on the order of 3 mm superiorly and 3 mm inferiorly on images 20-22/series 8, similar.  Again identified is a soft tissue density nodule along the posterolateral aspect of the left upper lobe bulla. Especially when compared back to 06/11/2017, there is increased nodularity. For example, today measuresto 10 x 7 mm on image 48/8. On the order of 9 x 4 mm on 06/11/2017 (when remeasured). Similarly, soft tissue density measures up to 12 by 9 mm on sagittal image 135 today versus 10 x 4 mm on 06/11/2017 (when remeasured).  Upper Abdomen: Moderate hepatic steatosis with sparing adjacent the gallbladder. 5.6 cm hypoattenuating posterior right hepatic lobe lesion is fluid density but has  enlarged from 4.0 cm on 06/11/2017. This measured 2.6 cm on 10/31/2014 and did not have worrisome features on that post-contrast abdominal CT. Normal imaged portions of the spleen, stomach, pancreas, kidneys. Abdominal aortic atherosclerosis. Bilateral adrenal nodularity is low-density, favoring small adenomas.  Musculoskeletal: Lower cervical spondylosis. Remote fracture of the sternal body.  IMPRESSION: 1. Especially when compared to 06/11/2017, interval increase in soft tissue thickening along the periphery (primarily posterior and lateral) of a left upper lobe bulla. Although this could represent superinfection, an underlying neoplasm is suspected. Consider further evaluation with PET. 2. No evidence of thoracic adenopathy. 3. Aortic atherosclerosis (ICD10-I70.0), coronary artery atherosclerosis and emphysema (ICD10-J43.9). 4. Hepatic steatosis. 5. Enlarging posterior right hepatic lobe lesion which is favored to represent a cyst, especially given presence back on 10/31/2014. Recommend attention on follow-up.  These results will be called to the ordering clinician or representative by the Radiologist Assistant, and communication documented in the PACS or zVision Dashboard.   Electronically Signed   By: Abigail Miyamoto M.D.   On: 08/18/2018 07:28 CLINICAL DATA:  Followup abnormal chest x-ray.  EXAM: CT CHEST WITHOUT CONTRAST  TECHNIQUE: Multidetector CT imaging of the chest was performed following the standard protocol without IV contrast.  COMPARISON:  Chest x-ray 06/05/2017 and 03/23/2015  FINDINGS: Cardiovascular: The heart is normal in size. No pericardial effusion. The aorta is normal in caliber. Moderate atherosclerotic calcifications. Dense three-vessel coronary artery calcifications are noted.  Mediastinum/Nodes: No mediastinal or hilar mass or lymphadenopathy. Small scattered lymph nodes are noted. The esophagus is  grossly normal.  Lungs/Pleura: Minimal/ mild emphysematous changes. No focal airspace consolidation to suggest pneumonia. No worrisome pulmonary lesions. Small bulla or up lung cysts noted in the left upper lobe on image number 51. Linear scarring type changes in the right upper lobe on image number 49.  Small clusters of airspace nodules noted in the left upper lobe on image number 25, in the right upper lobe on image number 32 and in the superior segment of the left lower lobe on images 42 and 43. This is likely benign inflammatory change or atypical infection such as MAC.  Upper Abdomen: No significant findings. There is a benign segment 7 hepatic cyst. Small bilateral adrenal gland adenomas. No adenopathy or mass.  Musculoskeletal: Remote healed proximal sternal fracture accounting for the recent  chest x-ray abnormality. In retrospect I believe the fracture was evident on the lateral chest film from 03/23/2015. No worrisome sternal lesion. The thoracic vertebral bodies are maintained.  IMPRESSION: 1. The sternal abnormality is a remote healed fracture. 2. Mild emphysematous changes and patchy small airspace nodules, likely atypical infection or inflammation. Recommend followup noncontrast chest CT in 3-4 months. 3. No mediastinal or hilar mass or adenopathy. 4. Atherosclerotic calcifications involving the abdominal and thoracic aorta and coronary arteries. 5. Simple appearing hepatic cyst.  Aortic Atherosclerosis (ICD10-I70.0) and Emphysema (ICD10-J43.9).   Electronically Signed   By: Marijo Sanes M.D.   On: 06/11/2017 14:39   Cardiac cath 09/06/2018  Prox RCA lesion is 30% stenosed.  Mid RCA lesion is 20% stenosed.  Prox Cx lesion is 20% stenosed.  Ost 1st Mrg lesion is 20% stenosed.  Ost LAD to Prox LAD lesion is 20% stenosed.  Prox LAD to Mid LAD lesion is 30% stenosed.  Mid LAD lesion is 55% stenosed.   There is evidence for moderate coronary  calcification without high-grade obstructive CAD.  There is 20 to 30% proximal to mid LAD stenoses with 55% mid stenosis in the mid vessel beyond a small diagonal vessel; more significant calcification involving the proximal left circumflex coronary artery with 20% proximal narrowing and 20% narrowing in the OM1 vessel; and 30 and 20% proximal to mid RCA stenoses.  Normal LV function without focal segmental wall motion abnormalities.  LVEDP 18 mm; however, the patient's blood pressure was low   prior to the procedure and she had received a 250 cc normal saline bolus.  RECOMMENDATION: Initial medical therapy.  With multivessel coronary calcification, recommend high potency statin therapy and will titrate rosuvastatin to 40 mg.  Patient had been started on Toprol-XL 25 mg.  Depending upon blood pressure, additional medical therapy can be added as needed. Recommend aspirin 81 mg.  Smoking cessation is essential  PFT'S 08/2018 FEV1  2.56   108% DLCO 19.07 96%  Assessment / Plan:   #1 increasing nodular density in the wall of the left upper lobe bulla, suspicious for carcinoma of the lung and long-term smoker. #2 patient with known COPD #3 cad as noted on recent cardiology evaluation  I discussed with the patient and her husband  proceeding with primary resection of the left upper lobe lung lesion.  The risks and expectations of surgical resection were discussed in detail with her and her husband. Her questions have been answered.   The goals risks and alternatives of the planned surgical procedure Procedure(s): VIDEO BRONCHOSCOPY (N/A) VIDEO ASSISTED THORACOSCOPY (VATS)/LUNG RESECTION (Left)  Has  been discussed with the patient in detail. The risks of the procedure including death, infection, stroke, myocardial infarction, bleeding, blood transfusion have all been discussed specifically.  I have quoted Karen Barrera a 3 % of perioperative mortality and a complication rate as high as 30 %.  The patient's questions have been answered.Karen Barrera is willing  to proceed with the planned procedure.    Grace Isaac MD      [address] [place] [address] Office [phone]   Beeper [phone]  09/28/2018 7:03 AM

## 2018-09-28 NOTE — Anesthesia Procedure Notes (Deleted)
Performed by: Orlie Dakin, CRNA

## 2018-09-28 NOTE — Anesthesia Postprocedure Evaluation (Signed)
Anesthesia Post Note  Patient: Karen Barrera  Procedure(s) Performed: VIDEO BRONCHOSCOPY (N/A Bronchus) LEFT VIDEO ASSISTED THORACOSCOPY (VATS)/Completion Left Upper lobe Lobectomy. Lymph node dissection. Intercostal nerve block. (Left Chest)     Patient location during evaluation: PACU Anesthesia Type: General Level of consciousness: awake and alert Pain management: pain level controlled Vital Signs Assessment: post-procedure vital signs reviewed and stable Respiratory status: spontaneous breathing, nonlabored ventilation, respiratory function stable and patient connected to nasal cannula oxygen Cardiovascular status: blood pressure returned to baseline and stable Postop Assessment: no apparent nausea or vomiting Anesthetic complications: no    Last Vitals:  Vitals:   09/28/18 1256 09/28/18 1309  BP: (!) 85/55 (!) 80/52  Pulse: 80 81  Resp: 14 14  Temp:    SpO2: 98% 98%    Last Pain:  Vitals:   09/28/18 1256  TempSrc:   PainSc: Asleep                 Karen Barrera P Karen Barrera

## 2018-09-28 NOTE — Transfer of Care (Signed)
Immediate Anesthesia Transfer of Care Note  Patient: Karen Barrera  Procedure(s) Performed: VIDEO BRONCHOSCOPY (N/A Bronchus) LEFT VIDEO ASSISTED THORACOSCOPY (VATS)/Completion Left Upper lobe Lobectomy. Lymph node dissection. Intercostal nerve block. (Left Chest)  Patient Location: PACU  Anesthesia Type:General  Level of Consciousness: drowsy and patient cooperative  Airway & Oxygen Therapy: Patient Spontanous Breathing and Patient connected to face mask oxygen  Post-op Assessment: Report given to RN and Post -op Vital signs reviewed and stable  Post vital signs: Reviewed and stable  Last Vitals:  Vitals Value Taken Time  BP 94/65 09/28/2018 11:54 AM  Temp    Pulse 77 09/28/2018 11:54 AM  Resp 17 09/28/2018 11:54 AM  SpO2 100 % 09/28/2018 11:54 AM  Vitals shown include unvalidated device data.  Last Pain:  Vitals:   09/28/18 0608  TempSrc:   PainSc: 0-No pain      Patients Stated Pain Goal: 0 (37/62/83 1517)  Complications: No apparent anesthesia complications

## 2018-09-28 NOTE — Anesthesia Procedure Notes (Signed)
Procedure Name: Intubation Date/Time: 09/28/2018 7:25 AM Performed by: Orlie Dakin, CRNA Pre-anesthesia Checklist: Patient identified, Emergency Drugs available, Suction available and Patient being monitored Patient Re-evaluated:Patient Re-evaluated prior to induction Oxygen Delivery Method: Circle system utilized Preoxygenation: Pre-oxygenation with 100% oxygen Induction Type: IV induction Ventilation: Mask ventilation without difficulty Laryngoscope Size: Miller and 3 Grade View: Grade I Tube type: Oral Endobronchial tube: Left and Double lumen EBT and 37 Fr Number of attempts: 1 Airway Equipment and Method: Stylet Placement Confirmation: ETT inserted through vocal cords under direct vision,  positive ETCO2 and breath sounds checked- equal and bilateral Secured at: 25 cm Tube secured with: Tape Dental Injury: Teeth and Oropharynx as per pre-operative assessment

## 2018-09-28 NOTE — Anesthesia Procedure Notes (Signed)
Central Venous Catheter Insertion Performed by: Murvin Natal, MD, anesthesiologist Start/End3/09/2018 6:45 AM, 09/28/2018 7:00 AM Patient location: Pre-op. Preanesthetic checklist: patient identified, IV checked, site marked, risks and benefits discussed, surgical consent, monitors and equipment checked, pre-op evaluation, timeout performed and anesthesia consent Position: Trendelenburg Lidocaine 1% used for infiltration and patient sedated Hand hygiene performed , maximum sterile barriers used  and Seldinger technique used Catheter size: 8 Fr Total catheter length 16. Central line was placed.Double lumen Procedure performed using ultrasound guided technique. Ultrasound Notes:anatomy identified, needle tip was noted to be adjacent to the nerve/plexus identified, no ultrasound evidence of intravascular and/or intraneural injection and image(s) printed for medical record Attempts: 1 Following insertion, dressing applied, line sutured and Biopatch. Post procedure assessment: blood return through all ports, free fluid flow and no air  Patient tolerated the procedure well with no immediate complications.

## 2018-09-28 NOTE — Brief Op Note (Addendum)
      [address]       [place] [address]             [phone]     09/28/2018  11:33 AM  PATIENT:  Karen Barrera  68 y.o. 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 Frozen section of wedge LUL showed adenocarcinoma.   COUNTS CORRECT:  YES  DICTATION: .Dragon Dictation  PLAN OF CARE: Admit to inpatient   PATIENT DISPOSITION:  PACU - hemodynamically stable.   Delay start of Pharmacological VTE agent (>24hrs) due to surgical blood loss or risk of bleeding: yes

## 2018-09-29 ENCOUNTER — Inpatient Hospital Stay (HOSPITAL_COMMUNITY): Payer: Medicare Other

## 2018-09-29 ENCOUNTER — Encounter (HOSPITAL_COMMUNITY): Payer: Self-pay | Admitting: Cardiothoracic Surgery

## 2018-09-29 LAB — CBC
HCT: 35.7 % — ABNORMAL LOW (ref 36.0–46.0)
Hemoglobin: 11.4 g/dL — ABNORMAL LOW (ref 12.0–15.0)
MCH: 29.8 pg (ref 26.0–34.0)
MCHC: 31.9 g/dL (ref 30.0–36.0)
MCV: 93.5 fL (ref 80.0–100.0)
Platelets: 164 10*3/uL (ref 150–400)
RBC: 3.82 MIL/uL — ABNORMAL LOW (ref 3.87–5.11)
RDW: 12.9 % (ref 11.5–15.5)
WBC: 8.8 10*3/uL (ref 4.0–10.5)
nRBC: 0 % (ref 0.0–0.2)

## 2018-09-29 LAB — BLOOD GAS, ARTERIAL
Acid-base deficit: 2 mmol/L (ref 0.0–2.0)
Bicarbonate: 22.7 mmol/L (ref 20.0–28.0)
Drawn by: 350431
O2 Content: 2 L/min
O2 Saturation: 98.4 %
Patient temperature: 97.8
pCO2 arterial: 40.4 mmHg (ref 32.0–48.0)
pH, Arterial: 7.365 (ref 7.350–7.450)
pO2, Arterial: 115 mmHg — ABNORMAL HIGH (ref 83.0–108.0)

## 2018-09-29 LAB — GLUCOSE, CAPILLARY: Glucose-Capillary: 97 mg/dL (ref 70–99)

## 2018-09-29 LAB — BASIC METABOLIC PANEL
Anion gap: 4 — ABNORMAL LOW (ref 5–15)
BUN: 9 mg/dL (ref 8–23)
CO2: 25 mmol/L (ref 22–32)
Calcium: 8.2 mg/dL — ABNORMAL LOW (ref 8.9–10.3)
Chloride: 105 mmol/L (ref 98–111)
Creatinine, Ser: 0.77 mg/dL (ref 0.44–1.00)
GFR calc Af Amer: >60 mL/min (ref >60)
GFR calc non Af Amer: >60 mL/min (ref >60)
Glucose, Bld: 108 mg/dL — ABNORMAL HIGH (ref 70–99)
Potassium: 4.4 mmol/L (ref 3.5–5.1)
Sodium: 134 mmol/L — ABNORMAL LOW (ref 135–145)

## 2018-09-29 MED ORDER — KETOROLAC TROMETHAMINE 30 MG/ML IJ SOLN
15.0000 mg | Freq: Four times a day (QID) | INTRAMUSCULAR | Status: AC
  Administered 2018-09-29 (×4): 15 mg via INTRAVENOUS
  Filled 2018-09-29 (×4): qty 1

## 2018-09-29 MED ORDER — ENOXAPARIN SODIUM 40 MG/0.4ML ~~LOC~~ SOLN
40.0000 mg | SUBCUTANEOUS | Status: DC
  Administered 2018-09-30 – 2018-10-05 (×6): 40 mg via SUBCUTANEOUS
  Filled 2018-09-29 (×6): qty 0.4

## 2018-09-29 MED ORDER — BIOTENE DRY MOUTH MT LIQD: 15.0000 mL | OROMUCOSAL | Status: DC | PRN

## 2018-09-29 NOTE — Discharge Instructions (Signed)
Discharge Instructions:  1. You may shower, please wash incisions daily with soap and water and keep dry.  If you wish to cover wounds with dressing you may do so but please keep clean and change daily.  No tub baths or swimming until incisions have completely healed.  If your incisions become red or develop any drainage please call our office at [phone]  2. No Driving until cleared by Dr. Everrett Coombe office and you are no longer using narcotic pain medications  3. Fever of 101.5 for at least 24 hours with no source, please contact our office at [phone]  4. Activity- up as tolerated, please walk at least 3 times per day.    5. If any questions or concerns arise, please do not hesitate to contact our office at [phone]

## 2018-09-29 NOTE — Discharge Summary (Signed)
Physician Discharge Summary       [address]       [place] [address]             [phone]    Patient ID: Karen Barrera [facility] MRN: [id number] DOB/AGE: 68-15-1952 68 y.o.  Admit date: 09/28/2018 Discharge date: 10/06/2018  Admission Diagnoses: Left upper lobe pulmonary nodule  Discharge Diagnoses:  1. S/p bronchoscopy, left VATS, LUL 2. Non small cancer LUL 3. History of chronic bronchitis 4. History of COPD (chronic obstructive pulmonary disease) ([place]) 5. History of CAD 6. History of DDD (degenerative disc disease) cervical and lumbar 7. History of GERD (gastroesophageal reflux disease) 8. History of borderline hyperlipidemia 9. History of pre-diabetes 10. History of seizures ([place]) 11. History of  tobacco dependence   Procedure (s):  Bronchoscopy, left video-assisted thoracoscopy, wedge resection, left upper lobe, completion left upper lobectomy with lymph node dissection and intercostal nerve blocks by Dr. Servando Snare on 09/28/2018.  Pathology: Lung, wedge biopsy/resection, Left upper lobe - MUCINOUS ADENOCARCINOMA, 1.2 CM. 2. Lung, resection (segmental or lobe), Left upper lobe - FINDINGS CONSISTENT WITH PREVIOUS WEDGE BIOPSY. - NO RESIDUAL CARCINOMA IN LOBECTOMY. - BRONCHIAL AND VASCULAR MARGINS FREE OF TUMOR. 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 3. Lymph node, biopsy, Level 10L - BENIGN LYMPH NODE. - NO TUMOR IDENTIFIED. 4. Lymph node, biopsy, Level 10L #2 - BENIGN LYMPH NODE. - NO TUMOR IDENTIFIED. 5. Lymph node, biopsy, Level 4L - BENIGN LYMPH NODE. - NO TUMOR IDENTIFIED. 6. Lymph node, biopsy, Level 11L - BENIGN LYMPH NODE. - NO TUMOR IDENTIFIED. 7. Lymph node, biopsy, Level 11L #2 - BENIGN LYMPH NODE. - NO TUMOR IDENTIFIED. 8. Lymph node, biopsy, Level 12L - BENIGN LUNG TISSUE. - NO LYMPH NODE TISSUE OR TUMOR. 9. Lymph node, biopsy, Level 5L - SCANT BENIGN LYMPHOID TISSUE AND BENIGN  LUNG. - NO TUMOR IDENTIFIED. TNM Code: pT1b, pN0  History of Presenting Illness: Karen Barrera is a 68 y.o. female who was seen in the office for evaluation of left upper lobe cyst with increasing solid portion  along with 6 mm nodular density is slowly increased in size on serial CT scans. She recently had cardiac cath and evaluation by Dr Claiborne Billings. Patient has known underlying COPD followed by Dr Annamaria Boots .  She has been a long-term smoker more than 50 years 1 pack/day, notes that she is currently cut down to a third of a pack a day.   On CT scan, she was noted to have significant calcification of her coronary arteries, underwent cardiac CT by Dr. Claiborne Billings and subsequent cardiac catheterization-proceeding with medical therapy for LAD disease.  Patient has an increasing nodular density in the wall of the left upper lobe bulla, suspicious for carcinoma of the lung and long-term smoker. Dr. Servando Snare  discussed with the patient and her husband  proceeding with primary resection of the left upper lobe lung lesion.  The risks and expectations of surgical resection were discussed in detail with her and her husband. Her questions have been answered. She was admitted to [facility] on 09/28/2018 in order to undergo a bronchoscopy, left VATS, wedge LUL followed by completion lobectomy, lymph node dissection, and intercostal nerve block.  Brief Hospital Course:  The patient remained afebrile and hemodynamically stable. A line and foley were removed early in the post operative course. Daily chest x rays were obtained and remained stable. Chest tubes were placed to water seal on 03/05.  She had a fair  amount of sero sanguinous drainage from the chest tubes. Chest tube output gradually decreased. [name] drain was removed on 03/06. Remaining chest tube had a small air leak. Remaining chest tube appeared to have tidling more than an air leak so it was clamped the morning of 03/10. Another chest x ray was obtained that  afternoon and showed no pneumothorax and subcutaneous emphysema in the base of the left neck and lower left chest  .  Chest tube was removed on 03/10. Patient has been ambulating on room air. Patient is tolerating a diet and has had a bowel movement. Wounds are clean and dry. Final chest X ray showed small left apical pneumothorax (has had). As discussed with Dr. Servando Snare, the patient is felt surgically stable for discharge today.   Latest Vital Signs: Blood pressure 112/65, pulse 84, temperature (!) 97.4 F (36.3 C), temperature source Oral, resp. rate 14, height 5\' 3"  (1.6 m), weight 81.6 kg, SpO2 97 %.  Physical Exam: Cardiovascular: RRR Pulmonary: Clear to auscultation  Abdomen: Soft, non tender, bowel sounds present. Extremities: No LE edema Wound: Clean and dry.  No erythema or signs of infection.   Discharge Condition: Stable and discharged to home.  Recent laboratory studies:  Lab Results  Component Value Date   WBC 8.9 09/30/2018   HGB 11.4 (L) 09/30/2018   HCT 34.6 (L) 09/30/2018   MCV 93.0 09/30/2018   PLT 144 (L) 09/30/2018   Lab Results  Component Value Date   NA 136 09/30/2018   K 4.1 09/30/2018   CL 104 09/30/2018   CO2 24 09/30/2018   CREATININE 0.88 09/30/2018   GLUCOSE 109 (H) 09/30/2018    Diagnostic Studies:  CLINICAL DATA:  Postoperative pneumothorax  EXAM: CHEST - 2 VIEW  COMPARISON:  Yesterday  FINDINGS: Small left pneumothorax, 10% or less. Stable chest wall emphysema. Postoperative volume loss on the left with atelectasis. The right lung is clear. Normal heart size. Small pleural effusion or extrapleural thickening on the lateral view.  IMPRESSION: Chest tube removal with interval small left pneumothorax.   Electronically Signed   By: Monte Fantasia M.D.   On: 10/06/2018 08:30  CLINICAL DATA:  Patient status post bronchoscopy 09/28/2018. Left pneumothorax with a chest tube in place. The chest tube was clamped this  morning.  EXAM: PORTABLE CHEST 1 VIEW  COMPARISON:  Single-view of the chest 10/05/2018 and 10/04/2018.  FINDINGS: Left chest tube is unchanged in position. No pneumothorax is identified. Subcutaneous emphysema in the base of the left neck and lower left chest is unchanged. Right lung is clear. Heart size is normal. Aortic atherosclerosis noted.  IMPRESSION: Negative for pneumothorax left chest tube in place. No acute abnormality.   Electronically Signed   By: Inge Rise M.D.   On: 10/05/2018 13:27     Discharge Medications: Allergies as of 10/06/2018      Reactions   Chantix [varenicline] Anaphylaxis, Swelling   Makes throat swell up   Moxifloxacin Hcl In Nacl Other (See Comments)   aches      Medication List    TAKE these medications   albuterol (2.5 MG/3ML) 0.083% nebulizer solution Commonly known as:  PROVENTIL Take 2.5 mg by nebulization every 6 (six) hours as needed for wheezing or shortness of breath.   albuterol 108 (90 Base) MCG/ACT inhaler Commonly known as:  PROVENTIL HFA;VENTOLIN HFA Inhale 2 puffs into the lungs every 6 (six) hours as needed for wheezing or shortness of breath.   alum & mag hydroxide-simeth  200-200-20 MG/5ML suspension Commonly known as:  MAALOX/MYLANTA Take 15-30 mLs by mouth every 6 (six) hours as needed for indigestion or heartburn.   aspirin EC 81 MG tablet Take 81 mg by mouth daily.   buPROPion 150 MG 24 hr tablet Commonly known as:  Wellbutrin XL Take 1 tablet every morning for Mood What changed:    how much to take  how to take this  when to take this   cetirizine 10 MG tablet Commonly known as:  ZYRTEC Take 10 mg by mouth daily.   CoQ10 200 MG Caps Take 200 mg by mouth daily.   diclofenac sodium 1 % Gel Commonly known as:  VOLTAREN Apply 2 g topically 4 (four) times daily as needed (joint pain).   diphenhydrAMINE 25 MG tablet Commonly known as:  BENADRYL Take 25 mg by mouth daily.    estradiol 1 MG tablet Commonly known as:  ESTRACE Take 1 mg by mouth daily.   ibuprofen 200 MG tablet Commonly known as:  ADVIL,MOTRIN Take 600 mg by mouth every 8 (eight) hours as needed for headache or moderate pain.   montelukast 10 MG tablet Commonly known as:  SINGULAIR TAKE 1 TABLET AT BEDTIME What changed:  when to take this   oxyCODONE 5 MG immediate release tablet Commonly known as:  Oxy IR/ROXICODONE Take 1 tablet (5 mg total) by mouth every 4 (four) hours as needed for severe pain.   rosuvastatin 40 MG tablet Commonly known as:  CRESTOR Take 1 tablet (40 mg total) by mouth daily.   sertraline 100 MG tablet Commonly known as:  Zoloft Take 1 tablet daily for Mood What changed:    how much to take  how to take this  when to take this  additional instructions   Toprol XL 25 MG 24 hr tablet Generic drug:  metoprolol succinate Take 1 tablet (25 mg total) by mouth daily.   umeclidinium-vilanterol 62.5-25 MCG/INH Aepb Commonly known as:  Anoro Ellipta Inhale 1 puff into the lungs daily.   Vitamin D-3 125 MCG (5000 UT) Tabs Take 10,000 Units by mouth daily. Please discuss dosage with medical doctor as is a high dose What changed:  additional instructions       Follow Up Appointments: Follow-up Information    Grace Isaac, MD. Go on 10/21/2018.   Specialty:  Cardiothoracic Surgery Why:  PA/LAT CXR to be taken (at [facility] [place] [facility] which is in the same building as Dr. Everrett Coombe office) on 03/26 at 8:30 am;Appointment time is at 9:00 am Contact information: [place] [address] [phone]           Signed: Sharalyn Ink [facility] 10/06/2018, 8:19 AM

## 2018-09-29 NOTE — Progress Notes (Signed)
Fentanlyl 3 mls wasted with bernadette Rn as a witness.

## 2018-09-29 NOTE — Op Note (Signed)
NAME: Karen Barrera, Karen Barrera MEDICAL RECORD [id number] ACCOUNT [id number] DATE OF BIRTH:04-30-1951 FACILITY: [facility] LOCATION: [facility] PHYSICIAN:Katrena Stehlin Maryruth Bun, MD  OPERATIVE REPORT  DATE OF PROCEDURE:  09/28/2018  PREOPERATIVE DIAGNOSIS:  Left upper lobe lung mass.  POSTOPERATIVE DIAGNOSIS:  Left upper lobe lung mass, nonsmall-cell lung cancer by frozen section.  PROCEDURE PERFORMED:  Bronchoscopy, left video-assisted thoracoscopy, wedge resection, left upper lobe, completion left upper lobectomy with lymph node dissection and intercostal nerve blocks.  SURGEON:  Lanelle Bal, MD  FIRST ASSISTANT:  Lars Pinks, PA.  BRIEF HISTORY:  The patient is a 68 year old female, current smoker, followed by Dr. Annamaria Boots for COPD.  She was noted on CT scan in August of 2019 to have a left upper lobe cyst, but with some increasing thickening of the cyst wall.  Followup CT scan done  in January showed this area was increasing in size.  The patient was referred to thoracic surgery.  PET scan showed very mild uptake in this area.  There was no evidence of metastatic disease.  With the obvious radiographic changes in a high risk  patient, we recommended proceeding with primary resection.  PFTs were adequate for lobectomy.  The patient also had extensive cardiac workup recently including cardiac catheterization by Dr. Claiborne Billings.  Risks and options were discussed with the patient in  detail and she was agreeable and willing to proceed.  DESCRIPTION OF PROCEDURE:  With central line and arterial line in place, the patient underwent general endotracheal anesthesia with a double lumen endotracheal tube.  Appropriate timeout was performed and we proceeded with fiberoptic bronchoscopy  confirming the position of the double lumen endotracheal tube.  No endobronchial lesions were appreciated.  Scope was removed.  The patient was turned in lateral decubitus position with the left side up.  The left side had been  preoperatively marked.   The left chest was prepped with Betadine, draped in the usual sterile manner.  A second timeout was performed and we proceeded with creation of a port site, posterior axillary line approximately sixth intercostal space.  A 5 mm trocar was introduced into  the left chest.  The lung was adequately decompressed.  The fissure was complete.  There was no visual inspection of the chest.  There were no obvious landmarks of the mass or cyst.  We then made a small incision slightly more superior between the  intercostal space.  Through this, we were able to palpate the cystic area.  Wedge resection of this area was done to biopsy the cyst.  Frozen section of this area confirmed nonsmall-cell lung cancer.  We then proceeded with a completion lobectomy.  The  fissure was well developed.  The right superior pulmonary vein was identified, dissected free and encircled with a vessel loop.  We then proceeded along the fissure, identifying the pulmonary artery branches to the lingula.  Area of the wedge resection  was along the junction between the lingular segments and the formal upper lobe segments so a lingular sparing upper lobectomy was not felt appropriate.  We continued dissection along the pulmonary artery and 3 additional pulmonary artery branches to the  upper lobe were identified.  Each of these were divided with a vascular stapler.  Pulmonary vein draining the left upper lobe was then also divided.  Using a gold-load stapler through the port site, we were able to clamp across the left upper lobe  bronchus.  With this clamp the inflation of the lung was tested.  The lower lobe inflated  well.  The bronchus was divided.  The remaining soft tissue areas were dissected free and the specimen placed in a specimen bag.  The specimen was brought out  through the incision in the specimen bag.  We then proceeded with further lymph node dissection and labeling lymph nodes according to station area.   The bronchial closure was tested and was free of leaks.  The inferior pulmonary ligament was divided up  to the inferior pulmonary vein.  Using 50 mL of saline, 30 mL of 0.5% bupivacaine and 20/0 mL of Exparel mixed, a spinal needle was used to perform spinal blocks above and below the operative areas.  A total of approximately 80 mL was used through the  port sites.  Two chest tubes, 1 Blake drain posteriorly and 1 standard [place] chest tube anteriorly were placed.  The lung was reinflated well.  Incision was closed with interrupted 0 Vicryl, running 3-0 Vicryl, subcutaneous tissue, 3-0 subcuticular  stitch in skin edges.  Dry dressings were applied.  Sponge and needle count was reported as correct at completion of the procedure.  RF scanning reported clear code.  Estimated blood loss less than 100 mL.  The patient was extubated in the operating room  and transferred to the recovery room having tolerated the procedure without obvious complication.  TN/NUANCE  D:09/29/2018 T:09/29/2018 JOB:[id number]

## 2018-09-29 NOTE — Progress Notes (Addendum)
      [address]       [place] [address]             [phone]       1 Day Post-Op Procedure(s) (LRB): VIDEO BRONCHOSCOPY (N/A) LEFT VIDEO ASSISTED THORACOSCOPY (VATS)/Completion Left Upper lobe Lobectomy. Lymph node dissection. Intercostal nerve block. (Left)  Subjective: Patient with incisional pain and dry mouth this am.  Objective: Vital signs in last 24 hours: Temp:  [97.5 F (36.4 C)-97.8 F (36.6 C)] 97.8 F (36.6 C) (03/04 0344) Pulse Rate:  [66-84] 67 (03/04 0600) Cardiac Rhythm: Normal sinus rhythm;Heart block (03/04 0430) Resp:  [10-22] 13 (03/04 0600) BP: (80-107)/(46-65) 92/56 (03/04 0347) SpO2:  [97 %-100 %] 98 % (03/04 0747) Arterial Line BP: (88-129)/(48-63) 88/53 (03/04 0600)     Intake/Output from previous day: 03/03 0701 - 03/04 0700 In: 3500 [I.V.:3150; IV Piggyback:350] Out: 3025 [Urine:2650; Blood:100; Chest Tube:275]   Physical Exam:  Cardiovascular: RRR Pulmonary: Clear to auscultation on the right and coarse on the left Abdomen: Soft, non tender, bowel sounds present. Extremities: SCDs in place Wound: Clean and dry.  No erythema or signs of infection. Chest Tubes:to suction, +1 air leak  Lab Results: CBC: Recent Labs    09/29/18 0305  WBC 8.8  HGB 11.4*  HCT 35.7*  PLT 164   BMET:  Recent Labs    09/29/18 0305  NA 134*  K 4.4  CL 105  CO2 25  GLUCOSE 108*  BUN 9  CREATININE 0.77  CALCIUM 8.2*    PT/INR: No results for input(s): LABPROT, INR in the last 72 hours. ABG:  INR: Will add last result for INR, ABG once components are confirmed Will add last 4 CBG results once components are confirmed  Assessment/Plan:  1. CV - SR, first degree heart block in the 60-70's. Will not restart Toprol XL yet. 2.  Pulmonary - On 2 liters of oxygen via [place]. Chest tubes with 275 cc since surgery. Chest tubes are to suction. There is + 1 air leak. CXR this am appears to show no pneumothorax and atelectasis on the  right. Encourage incentive spirometer. Await final pathology. 3. Remove a line, decrease IVF 4. CBGs 119/97. No history of DM. Will stop accu checks and SS PRN 5. Biotene for dry mouth 6. Toradol to help with pain  Donielle M ZimmermanPA-C 09/29/2018,7:50 AM [phone]  Stable post op Leave chest tubes xray ok Foley out , a line out  Ambulate  I have seen and examined Ruben Reason and agree with the above assessment  and plan.  Grace Isaac MD Beeper [phone] Office [phone] 09/29/2018 8:52 AM

## 2018-09-30 ENCOUNTER — Inpatient Hospital Stay (HOSPITAL_COMMUNITY): Payer: Medicare Other

## 2018-09-30 LAB — CBC
HCT: 34.6 % — ABNORMAL LOW (ref 36.0–46.0)
Hemoglobin: 11.4 g/dL — ABNORMAL LOW (ref 12.0–15.0)
MCH: 30.6 pg (ref 26.0–34.0)
MCHC: 32.9 g/dL (ref 30.0–36.0)
MCV: 93 fL (ref 80.0–100.0)
Platelets: 144 10*3/uL — ABNORMAL LOW (ref 150–400)
RBC: 3.72 MIL/uL — ABNORMAL LOW (ref 3.87–5.11)
RDW: 12.9 % (ref 11.5–15.5)
WBC: 8.9 10*3/uL (ref 4.0–10.5)
nRBC: 0 % (ref 0.0–0.2)

## 2018-09-30 LAB — COMPREHENSIVE METABOLIC PANEL
ALT: 19 U/L (ref 0–44)
AST: 24 U/L (ref 15–41)
Albumin: 3 g/dL — ABNORMAL LOW (ref 3.5–5.0)
Alkaline Phosphatase: 46 U/L (ref 38–126)
Anion gap: 8 (ref 5–15)
BUN: 12 mg/dL (ref 8–23)
CO2: 24 mmol/L (ref 22–32)
Calcium: 8.2 mg/dL — ABNORMAL LOW (ref 8.9–10.3)
Chloride: 104 mmol/L (ref 98–111)
Creatinine, Ser: 0.88 mg/dL (ref 0.44–1.00)
GFR calc Af Amer: >60 mL/min (ref >60)
GFR calc non Af Amer: >60 mL/min (ref >60)
Glucose, Bld: 109 mg/dL — ABNORMAL HIGH (ref 70–99)
Potassium: 4.1 mmol/L (ref 3.5–5.1)
Sodium: 136 mmol/L (ref 135–145)
Total Bilirubin: 0.4 mg/dL (ref 0.3–1.2)
Total Protein: 5.6 g/dL — ABNORMAL LOW (ref 6.5–8.1)

## 2018-09-30 NOTE — Progress Notes (Signed)
Spoke with RN Ulice Dash and he states he is aware of the DC central line order

## 2018-09-30 NOTE — Progress Notes (Addendum)
      [address]       [place] [address]             [phone]       2 Days Post-Op Procedure(s) (LRB): VIDEO BRONCHOSCOPY (N/A) LEFT VIDEO ASSISTED THORACOSCOPY (VATS)/Completion Left Upper lobe Lobectomy. Lymph node dissection. Intercostal nerve block. (Left)  Subjective: Patient without specific complaints this am.  Objective: Vital signs in last 24 hours: Temp:  [97.6 F (36.4 C)-98.2 F (36.8 C)] 98.1 F (36.7 C) (03/05 0740) Pulse Rate:  [65-74] 68 (03/04 2356) Cardiac Rhythm: Normal sinus rhythm (03/04 2000) Resp:  [12-20] 15 (03/05 0740) BP: (95-131)/(54-61) 101/54 (03/05 0740) SpO2:  [95 %-100 %] 99 % (03/05 0350)     Intake/Output from previous day: 03/04 0701 - 03/05 0700 In: 340 [P.O.:220; I.V.:120] Out: 3178 [Urine:3100; Chest Tube:78]   Physical Exam:  Cardiovascular: RRR Pulmonary: Clear to auscultation on the right and coarse on the left (chest tubes in place) Abdomen: Soft, non tender, bowel sounds present. Extremities: SCDs in place Wound: Clean and dry.  No erythema or signs of infection. Chest Tubes:to suction, +1 air leak with cough  Lab Results: CBC: Recent Labs    09/29/18 0305 09/30/18 0350  WBC 8.8 8.9  HGB 11.4* 11.4*  HCT 35.7* 34.6*  PLT 164 144*   BMET:  Recent Labs    09/29/18 0305 09/30/18 0350  NA 134* 136  K 4.4 4.1  CL 105 104  CO2 25 24  GLUCOSE 108* 109*  BUN 9 12  CREATININE 0.77 0.88  CALCIUM 8.2* 8.2*    PT/INR: No results for input(s): LABPROT, INR in the last 72 hours. ABG:  INR: Will add last result for INR, ABG once components are confirmed Will add last 4 CBG results once components are confirmed  Assessment/Plan:  1. CV - SR, first degree heart block in the 70's. Will not restart Toprol XL yet as BP still somewhat labile. 2.  Pulmonary - On 2 liters of oxygen via [place]. Chest tubes with 78 cc since surgery. Chest tubes are to suction. There is + 1 air leak with cough. CXR  this am appears to show ? trace left pneumothorax and atelectasis on the right. Will place chest tubes to water seal as discussed with Dr. Servando Snare.  Encourage incentive spirometer. Final pathology: MUCINOUS ADENOCARCINOMA,pT1b, pN0 (Dr. Servando Snare to discuss with patient) 3. ABL expected anemia-H and H stable 11.4 and 34.6 4. Remove central line  Karen M ZimmermanPA-C 09/30/2018,7:42 AM [phone]  discussed path with patient Cancer Staging Lung cancer [facility]) Staging form: Lung, AJCC 8th Edition - Pathologic stage from 09/29/2018: Stage IA2 (pT1b, pN0, cM0) - Signed by Grace Isaac, MD on 09/29/2018  Chest tube to water seal, d/c pca tomorrow Central line out today  I have seen and examined Karen Barrera and agree with the above assessment  and plan.  Grace Isaac MD Beeper [phone] Office [phone] 09/30/2018 8:48 AM

## 2018-09-30 NOTE — Progress Notes (Signed)
Central line discontinued. Dressing to right side of neck: clean, dry, and intact. Patient ambulated 175 feet. O2 SAT maintained at 95-98% during ambulation and at rest.

## 2018-09-30 NOTE — Plan of Care (Signed)
  Problem: Education: Goal: Knowledge of General Education information will improve Description Including pain rating scale, medication(s)/side effects and non-pharmacologic comfort measures Outcome: Progressing Note:  POC and pain management reviewed with pt.   

## 2018-10-01 ENCOUNTER — Inpatient Hospital Stay (HOSPITAL_COMMUNITY): Payer: Medicare Other

## 2018-10-01 MED ORDER — KETOROLAC TROMETHAMINE 30 MG/ML IJ SOLN
15.0000 mg | Freq: Four times a day (QID) | INTRAMUSCULAR | Status: AC
  Administered 2018-10-01 (×3): 15 mg via INTRAVENOUS
  Filled 2018-10-01 (×3): qty 1

## 2018-10-01 NOTE — Progress Notes (Addendum)
[address]       [place] [address]             [phone]       3 Days Post-Op Procedure(s) (LRB): VIDEO BRONCHOSCOPY (N/A) LEFT VIDEO ASSISTED THORACOSCOPY (VATS)/Completion Left Upper lobe Lobectomy. Lymph node dissection. Intercostal nerve block. (Left)  Subjective: Patient with a lot of pain at chest tube sites and a lot of draining.  Objective: Vital signs in last 24 hours: Temp:  [98 F (36.7 C)-98.4 F (36.9 C)] 98 F (36.7 C) (03/06 0301) Pulse Rate:  [68-81] 78 (03/06 0301) Cardiac Rhythm: Normal sinus rhythm (03/05 1923) Resp:  [13-22] 14 (03/06 0301) BP: (101-120)/(54-69) 115/69 (03/06 0301) SpO2:  [93 %-98 %] 98 % (03/06 0301)     Intake/Output from previous day: 03/05 0701 - 03/06 0700 In: 204 [I.V.:124; IV Piggyback:80] Out: -    Physical Exam:  Cardiovascular: RRR Pulmonary: Clear to auscultation on the right and diminished left base Abdomen: Soft, non tender, bowel sounds present. Extremities: SCDs in place Wound: Clean and dry.  No erythema or signs of infection. Chest Tubes:to water seal, +++ air leak with cough Chest tube dressings saturated with sero sanguineous ooze  Lab Results: CBC: Recent Labs    09/29/18 0305 09/30/18 0350  WBC 8.8 8.9  HGB 11.4* 11.4*  HCT 35.7* 34.6*  PLT 164 144*   BMET:  Recent Labs    09/29/18 0305 09/30/18 0350  NA 134* 136  K 4.4 4.1  CL 105 104  CO2 25 24  GLUCOSE 108* 109*  BUN 9 12  CREATININE 0.77 0.88  CALCIUM 8.2* 8.2*    PT/INR: No results for input(s): LABPROT, INR in the last 72 hours. ABG:  INR: Will add last result for INR, ABG once components are confirmed Will add last 4 CBG results once components are confirmed  Assessment/Plan:  1. CV - SR, first degree heart block in the 80's. Will not restart Toprol XL yet as BP still somewhat labile. 2.  Pulmonary -  Chest tubes with output not recorded.  Chest tubes are to water seal. There is +++ air leak with  cough. CXR this am appears to stable. Will remove Blake drain. Encourage incentive spirometer. Check CXR in am. 3. ABL expected anemia-Last H and H stable 11.4 and 34.6 4. Will remove PCA later this am 5. Toradol for pain  Donielle M ZimmermanPA-C 10/01/2018,7:26 AM [phone]  EVENING ROUNDS NOTE :     [address]       [place] [address]             [phone]                 3 Days Post-Op Procedure(s) (LRB): VIDEO BRONCHOSCOPY (N/A) LEFT VIDEO ASSISTED THORACOSCOPY (VATS)/Completion Left Upper lobe Lobectomy. Lymph node dissection. Intercostal nerve block. (Left)  Total Length of Stay:  LOS: 3 days  BP 115/69 (BP Location: Left Arm)   Pulse 73   Temp 98 F (36.7 C)   Resp 18   Ht 5\' 3"  (1.6 m)   Wt 81.6 kg   SpO2 100%   BMI 31.89 kg/m   .Intake/Output      03/05 0701 - 03/06 0700 03/06 0701 - 03/07 0700   P.O.     I.V. (mL/kg) 124 (1.5)    IV Piggyback 80    Total Intake(mL/kg) 204 (2.5)    Urine (mL/kg/hr)  Chest Tube 50 75   Total Output 50 75   Net +154 -75          . 0.9 % NaCl with KCl 20 mEq / L 10 mL/hr at 10/01/18 1157     Lab Results  Component Value Date   WBC 8.9 09/30/2018   HGB 11.4 (L) 09/30/2018   HCT 34.6 (L) 09/30/2018   PLT 144 (L) 09/30/2018   GLUCOSE 109 (H) 09/30/2018   CHOL 200 (H) 08/11/2018   TRIG 154 (H) 08/11/2018   HDL 76 08/11/2018   LDLCALC 98 08/11/2018   ALT 19 09/30/2018   AST 24 09/30/2018   NA 136 09/30/2018   K 4.1 09/30/2018   CL 104 09/30/2018   CREATININE 0.88 09/30/2018   BUN 12 09/30/2018   CO2 24 09/30/2018   TSH 2.10 08/11/2018   INR 1.1 09/24/2018   HGBA1C 5.9 (H) 08/11/2018   MICROALBUR 0.4 08/11/2018      Grace Isaac MD  Beeper [phone] Office [phone] 10/01/2018 5:10 PM

## 2018-10-01 NOTE — Care Management Important Message (Signed)
Important Message  Patient Details  Name: Karen Barrera MRN: [id number] Date of Birth: Jul 06, 1951   Medicare Important Message Given:  Yes    Jnyah Brazee P Tutuilla 10/01/2018, 11:09 AM

## 2018-10-02 ENCOUNTER — Inpatient Hospital Stay (HOSPITAL_COMMUNITY): Payer: Medicare Other

## 2018-10-02 MED ORDER — DICLOFENAC SODIUM 1 % TD GEL
2.0000 g | Freq: Four times a day (QID) | TRANSDERMAL | Status: DC | PRN
  Filled 2018-10-02: qty 100

## 2018-10-02 MED ORDER — ONDANSETRON HCL 4 MG/2ML IJ SOLN
4.0000 mg | Freq: Four times a day (QID) | INTRAMUSCULAR | Status: AC | PRN
  Administered 2018-10-02 – 2018-10-04 (×5): 4 mg via INTRAVENOUS
  Filled 2018-10-02 (×5): qty 2

## 2018-10-02 NOTE — Progress Notes (Addendum)
4 Days Post-Op Procedure(s) (LRB): VIDEO BRONCHOSCOPY (N/A) LEFT VIDEO ASSISTED THORACOSCOPY (VATS)/Completion Left Upper lobe Lobectomy. Lymph node dissection. Intercostal nerve block. (Left) Subjective: Awake and alert, sitting up on side of bed.  She has no complaints now.  She had some nausea earlier this morning and was treated with Zofran with resolution. Pain control has been adequate with tramadol and Tylenol.  Objective: Vital signs in last 24 hours: Temp:  [97.3 F (36.3 C)-97.8 F (36.6 C)] 97.7 F (36.5 C) (03/07 0758) Pulse Rate:  [67-75] 68 (03/07 0400) Cardiac Rhythm: Normal sinus rhythm (03/07 0700) Resp:  [11-21] 18 (03/07 0400) BP: (93-113)/(64-80) 102/70 (03/07 0300) SpO2:  [96 %-100 %] 97 % (03/07 0843)    Intake/Output from previous day: 03/06 0701 - 03/07 0700 In: 62.7 [I.V.:62.7] Out: 125 [Chest Tube:125] Intake/Output this shift: No intake/output data recorded.  General appearance: alert, cooperative and no distress Neurologic: intact Heart: regular rate and rhythm Lungs: clear to auscultation bilaterally Abdomen:Soft and non-tender Wound: serous drainage from around the CT insertion site. Tube is secured and well positioned. Active air leak persists.  Lab Results: Recent Labs    09/30/18 0350  WBC 8.9  HGB 11.4*  HCT 34.6*  PLT 144*   BMET:  Recent Labs    09/30/18 0350  NA 136  K 4.1  CL 104  CO2 24  GLUCOSE 109*  BUN 12  CREATININE 0.88  CALCIUM 8.2*    PT/INR: No results for input(s): LABPROT, INR in the last 72 hours. ABG    Component Value Date/Time   PHART 7.365 09/29/2018 0345   HCO3 22.7 09/29/2018 0345   ACIDBASEDEF 2.0 09/29/2018 0345   O2SAT 98.4 09/29/2018 0345   CBG (last 3)  No results for input(s): GLUCAP in the last 72 hours.  Assessment/Plan: S/P Procedure(s) (LRB): VIDEO BRONCHOSCOPY (N/A) LEFT VIDEO ASSISTED THORACOSCOPY (VATS)/Completion Left Upper lobe Lobectomy. Lymph node dissection. Intercostal  nerve block. 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  She remained stable clinically.  When chest tube was removed yesterday.  There is a tiny apical lateral pneumothorax.  There is a small amount of subcutaneous air along the tract of the tubing.  Leave chest tube to waterseal.  -History of hypertension--blood pressure remained satisfactory without medication.  We will continue to hold her Toprol and monitor closely. -History of COPD-Singulair and inhaled medications have been resumed.  She is maintaining oxygen saturation on room air -History of depression-antidepressants have been resumed. -Dyslipidemia-continue Crestor -DVT prophylaxis-continue enoxaparin.  Encourage ambulation   LOS: 4 days    Antony Odea, PA-C [phone] 10/02/2018  I have seen and examined the patient and agree with the assessment and plan as outlined.  Rexene Alberts, MD 10/02/2018 10:27 AM

## 2018-10-02 NOTE — Plan of Care (Signed)

## 2018-10-02 NOTE — Progress Notes (Signed)
Pt ambulated in a hallway 2-3 times without distress and SOB, tylenol is covering for pain on incision site, dressing changed and reinforced later, pt is tolerating diet and activity, Voltaren gel ordered with a verbal order of MD for a complain of back muscle pain, will continue to monitor the patient  Palma Holter, RN

## 2018-10-02 NOTE — Progress Notes (Signed)
Patient woke up with back pain and nausea. Dr. Roxy Manns paged and gave verbal order to give patient zofran. Patient started feeling relief within 20 mins of zofran IV. Patient also complains of back pain, she was given a hot pack and 5mg  of oxycodone. Will continue to monitor patients pain and nausea.

## 2018-10-03 ENCOUNTER — Inpatient Hospital Stay (HOSPITAL_COMMUNITY): Payer: Medicare Other

## 2018-10-03 NOTE — Plan of Care (Signed)

## 2018-10-03 NOTE — Plan of Care (Signed)

## 2018-10-03 NOTE — Progress Notes (Addendum)
5 Days Post-Op Procedure(s) (LRB): VIDEO BRONCHOSCOPY (N/A) LEFT VIDEO ASSISTED THORACOSCOPY (VATS)/Completion Left Upper lobe Lobectomy. Lymph node dissection. Intercostal nerve block. (Left) Subjective: Rested well. No new problems. Maintaining O2 sats on RA.  Objective: Vital signs in last 24 hours: Temp:  [97.5 F (36.4 C)-98 F (36.7 C)] 97.5 F (36.4 C) (03/08 0732) Pulse Rate:  [74-85] 74 (03/08 0800) Cardiac Rhythm: Normal sinus rhythm (03/08 0700) Resp:  [12-25] 12 (03/08 0900) BP: (103-133)/(61-89) 111/67 (03/08 0732) SpO2:  [92 %-99 %] 99 % (03/08 0829)     Intake/Output from previous day: 03/07 0701 - 03/08 0700 In: 480 [P.O.:480] Out: 1250 [Urine:1200; Chest Tube:50] Intake/Output this shift: Total I/O In: 240 [P.O.:240] Out: 300 [Urine:300]  General appearance: alert, cooperative and no distress Neurologic: intact Heart: regular rate and rhythm Lungs: clear to auscultation bilaterally Abdomen:Soft and non-tender Wound: serous drainage from around the CT insertion site has diminished. Tube is secured and well positioned. Air leak has slowed, now occurring primarily with cough. CXR is pending.  Lab Results: No results for input(s): WBC, HGB, HCT, PLT in the last 72 hours. BMET: No results for input(s): NA, K, CL, CO2, GLUCOSE, BUN, CREATININE, CALCIUM in the last 72 hours.  PT/INR: No results for input(s): LABPROT, INR in the last 72 hours. ABG    Component Value Date/Time   PHART 7.365 09/29/2018 0345   HCO3 22.7 09/29/2018 0345   ACIDBASEDEF 2.0 09/29/2018 0345   O2SAT 98.4 09/29/2018 0345   CBG (last 3)  No results for input(s): GLUCAP in the last 72 hours.  Assessment/Plan: S/P Procedure(s) (LRB): VIDEO BRONCHOSCOPY (N/A) LEFT VIDEO ASSISTED THORACOSCOPY (VATS)/Completion Left Upper lobe Lobectomy. Lymph node dissection. Intercostal nerve block. (Left)  -Postop day 5 video-assisted thoracoscopy with left upper lobectomy for pathologic stage  T1bN0 mucinous adenocarcinoma of the lung.  She remained stable clinically.  One chest tube was removed 10/02/18.  The air leak via the remaining chest tube is gradually improving.  Leave chest tube to waterseal.  -History of hypertension--blood pressure remained satisfactory without medication.  We will continue to hold her Toprol and monitor closely. -History of COPD-Singulair and inhaled medications have been resumed.  She is maintaining oxygen saturation on room air -History of depression-antidepressants have been resumed. -Dyslipidemia-continue Crestor -DVT prophylaxis-continue enoxaparin.  Encourage ambulation    LOS: 5 days    Antony Odea 10/03/2018  I have seen and examined the patient and agree with the assessment and plan as outlined.  Rexene Alberts, MD 10/03/2018 11:28 AM

## 2018-10-04 ENCOUNTER — Inpatient Hospital Stay (HOSPITAL_COMMUNITY): Payer: Medicare Other

## 2018-10-04 NOTE — Progress Notes (Addendum)
      [address]       [place] [address]             [phone]       6 Days Post-Op Procedure(s) (LRB): VIDEO BRONCHOSCOPY (N/A) LEFT VIDEO ASSISTED THORACOSCOPY (VATS)/Completion Left Upper lobe Lobectomy. Lymph node dissection. Intercostal nerve block. (Left)  Subjective: Patient without specific complaints this am.  Objective: Vital signs in last 24 hours: Temp:  [97.4 F (36.3 C)-98.3 F (36.8 C)] 97.5 F (36.4 C) (03/09 0300) Pulse Rate:  [46-118] 81 (03/09 0300) Cardiac Rhythm: Normal sinus rhythm (03/09 0700) Resp:  [12-24] 15 (03/09 0300) BP: (103-129)/(64-107) 108/75 (03/09 0300) SpO2:  [73 %-100 %] 98 % (03/09 0741)     Intake/Output from previous day: 03/08 0701 - 03/09 0700 In: 52 [P.O.:580] Out: 1010 [Urine:900; Chest Tube:110]   Physical Exam:  Cardiovascular: RRR Pulmonary: Clear to auscultation on the right and diminished left base Abdomen: Soft, non tender, bowel sounds present. Extremities: SCDs in place Wound: Clean and dry.  No erythema or signs of infection. Chest Tubes:to water seal,  Intermittent + air leak with cough Chest tube dressing with sanguineous ooze  Lab Results: CBC: No results for input(s): WBC, HGB, HCT, PLT in the last 72 hours. BMET:  No results for input(s): NA, K, CL, CO2, GLUCOSE, BUN, CREATININE, CALCIUM in the last 72 hours.  PT/INR: No results for input(s): LABPROT, INR in the last 72 hours. ABG:  INR: Will add last result for INR, ABG once components are confirmed Will add last 4 CBG results once components are confirmed  Assessment/Plan:  1. CV - SR, first degree heart block in the 80's. Will not restart Toprol XL yet as BP still somewhat labile. 2.  Pulmonary -  Chest tube with 110 cc output last 24 hours. Chest tubes are to water seal. There is + intermittent air leak with cough. CXR this am appears to stable. Encourage incentive spirometer. Check CXR in am 3. ABL expected anemia-Last H  and H stable 11.4 and 34.6   Donielle M ZimmermanPA-C 10/04/2018,7:58 AM [phone]  Very small air leak with cough, large tidal movement Leave chest tube on moe day, poss removal tomorrow  I have seen and examined [place] and agree with the above assessment  and plan.  Grace Isaac MD Beeper [phone] Office [phone] 10/04/2018 12:37 PM

## 2018-10-04 NOTE — Plan of Care (Signed)
  Problem: Clinical Measurements: Goal: Ability to maintain clinical measurements within normal limits will improve Outcome: Progressing Goal: Will remain free from infection Outcome: Progressing   Problem: Activity: Goal: Risk for activity intolerance will decrease Outcome: Completed/Met  Pt ambulating halls with assistance of front wheel walker for stability and chest tube management. Tolerated well.

## 2018-10-05 ENCOUNTER — Inpatient Hospital Stay (HOSPITAL_COMMUNITY): Payer: Medicare Other

## 2018-10-05 MED ORDER — ALBUTEROL SULFATE HFA 108 (90 BASE) MCG/ACT IN AERS: 2.0000 | INHALATION_SPRAY | Freq: Four times a day (QID) | RESPIRATORY_TRACT | Status: DC | PRN

## 2018-10-05 NOTE — Care Management Important Message (Signed)
Important Message  Patient Details  Name: Karen Barrera MRN: [id number] Date of Birth: 12-Feb-1951   Medicare Important Message Given:  Yes    Barb Merino Mikel [place]ve 10/05/2018, 5:54 PM

## 2018-10-05 NOTE — Progress Notes (Signed)
Chest tube clamped by Dr. Servando Snare this am CXR just taken appears stable. As discussed with Dr. Servando Snare, remove chest tube. Patient informed.

## 2018-10-05 NOTE — Progress Notes (Addendum)
      [address]       [place] [address]             [phone]       7 Days Post-Op Procedure(s) (LRB): VIDEO BRONCHOSCOPY (N/A) LEFT VIDEO ASSISTED THORACOSCOPY (VATS)/Completion Left Upper lobe Lobectomy. Lymph node dissection. Intercostal nerve block. (Left)  Subjective: Patient without specific complaints this am. She hopes chest tube comes out today.  Objective: Vital signs in last 24 hours: Temp:  [97.4 F (36.3 C)-98.1 F (36.7 C)] 97.7 F (36.5 C) (03/10 0300) Pulse Rate:  [80-89] 80 (03/10 0300) Cardiac Rhythm: Normal sinus rhythm (03/10 0400) Resp:  [10-21] 17 (03/10 0300) BP: (98-132)/(60-82) 102/72 (03/10 0300) SpO2:  [98 %-99 %] 98 % (03/10 0300)     Intake/Output from previous day: 03/09 0701 - 03/10 0700 In: 510 [P.O.:510] Out: 50 [Chest Tube:50]   Physical Exam:  Cardiovascular: RRR Pulmonary: Clear to auscultation on the right and diminished left base Abdomen: Soft, non tender, bowel sounds present. Extremities: No LE edema Wound: Clean and dry.  No erythema or signs of infection. Chest Tube: to water seal,  Intermittent + tidling with cough   Lab Results: CBC: No results for input(s): WBC, HGB, HCT, PLT in the last 72 hours. BMET:  No results for input(s): NA, K, CL, CO2, GLUCOSE, BUN, CREATININE, CALCIUM in the last 72 hours.  PT/INR: No results for input(s): LABPROT, INR in the last 72 hours. ABG:  INR: Will add last result for INR, ABG once components are confirmed Will add last 4 CBG results once components are confirmed  Assessment/Plan:  1. CV - SR, first degree heart block in the 80's. Will  restart Toprol XL at discharge. 2.  Pulmonary -  Chest tube with 50 cc output last 24 hours. Chest tube is to water seal. There is tidling. As discussed with Dr. Servando Snare, unsure if is an actual air leak. CXR this am appears to stable (trace left apical pneumohorax). Hope to remove chest tube soon. Encourage incentive  spirometer. Check CXR in am 3. ABL expected anemia-Last H and H stable 11.4 and 34.6   Donielle M ZimmermanPA-C 10/05/2018,7:19 AM [phone]  Large tidal with cough, not clear air leak , will clamp tube and get follow up xray later today if unchanged them d/c tube and poss hope tomorrow  I have seen and examined Ruben Reason and agree with the above assessment  and plan.  Grace Isaac MD Beeper [phone] Office [phone] 10/05/2018 8:24 AM

## 2018-10-06 ENCOUNTER — Inpatient Hospital Stay (HOSPITAL_COMMUNITY): Payer: Medicare Other

## 2018-10-06 ENCOUNTER — Ambulatory Visit: Payer: Medicare Other | Admitting: Internal Medicine

## 2018-10-06 MED ORDER — OXYCODONE HCL 5 MG PO TABS: 5.0000 mg | ORAL_TABLET | ORAL | 0 refills | Status: DC | PRN

## 2018-10-06 MED ORDER — VITAMIN D-3 125 MCG (5000 UT) PO TABS: 10000.0000 [IU] | ORAL_TABLET | Freq: Every day | ORAL | 0 refills | Status: DC

## 2018-10-06 NOTE — Progress Notes (Signed)
Removed PIV access and received discharge instructions. Pt & her husband understood well. Husband took her belongings. HS [employer]

## 2018-10-06 NOTE — Progress Notes (Addendum)
      [address]       [place] [address]             [phone]       8 Days Post-Op Procedure(s) (LRB): VIDEO BRONCHOSCOPY (N/A) LEFT VIDEO ASSISTED THORACOSCOPY (VATS)/Completion Left Upper lobe Lobectomy. Lymph node dissection. Intercostal nerve block. (Left)  Subjective: Patient without specific complaints this am. She hopes to go home.  Objective: Vital signs in last 24 hours: Temp:  [97.4 F (36.3 C)-98.6 F (37 C)] 97.5 F (36.4 C) (03/11 0300) Pulse Rate:  [82-90] 90 (03/11 0300) Cardiac Rhythm: Normal sinus rhythm (03/11 0700) Resp:  [14-19] 17 (03/11 0300) BP: (95-422)/(68-86) 129/74 (03/11 0300) SpO2:  [96 %-98 %] 98 % (03/11 0300)     Intake/Output from previous day: 03/10 0701 - 03/11 0700 In: 120 [P.O.:120] Out: -    Physical Exam:  Cardiovascular: RRR Pulmonary: Clear to auscultation  Abdomen: Soft, non tender, bowel sounds present. Extremities: No LE edema Wound: Clean and dry.  No erythema or signs of infection.    Lab Results: CBC: No results for input(s): WBC, HGB, HCT, PLT in the last 72 hours. BMET:  No results for input(s): NA, K, CL, CO2, GLUCOSE, BUN, CREATININE, CALCIUM in the last 72 hours.  PT/INR: No results for input(s): LABPROT, INR in the last 72 hours. ABG:  INR: Will add last result for INR, ABG once components are confirmed Will add last 4 CBG results once components are confirmed  Assessment/Plan:  1. CV - SR, first degree heart block in the 80's. Will  restart Toprol XL at discharge. 2.  Pulmonary -  Remaining chest tube removed yesterday.  CXR this am appears to stable (minor subcutaneous emphysema left lateral chest wall and left neck). Encourage incentive spirometer.  3. ABL expected anemia-Last H and H stable 11.4 and 34.6 4. Discharge patient once chest x ray officially read  Sharalyn Ink [facility] 10/06/2018,7:32 AM [phone]  Chest xray stable, unchanged not increase in ptx Wounds  ok  H/h stable and patient feels well Plan home today Cancer Staging Lung cancer [facility]) Staging form: Lung, AJCC 8th Edition - Pathologic stage from 09/29/2018: Stage IA2 (pT1b, pN0, cM0) - Signed by Grace Isaac, MD on 09/29/2018 I have seen and examined Ruben Reason and agree with the above assessment  and plan.  Grace Isaac MD Beeper [phone] Office [phone] 10/06/2018 7:51 AM

## 2018-10-07 ENCOUNTER — Other Ambulatory Visit: Payer: Self-pay | Admitting: *Deleted

## 2018-10-07 NOTE — Progress Notes (Signed)
The proposed treatment discussed in cancer conference 10/07/2018 is for discussion purpose only and is not a binding recommendation.  The patient was not physically examined nor present for their treatment options.  Therefore, final treatment plans cannot be decided.

## 2018-10-08 ENCOUNTER — Telehealth: Payer: Self-pay | Admitting: Physician Assistant

## 2018-10-08 NOTE — Telephone Encounter (Signed)
Called patient on 10/08/2018 , 10:19 AM in an attempt to reach the patient for a hospital follow up.   Admit date: 09/28/18 Discharge: 10/06/18   She does not have any questions or concerns about medications from the hospital admission. The patient's medications were reviewed over the phone, they were counseled to bring in all current medications to the hospital follow up visit.   I advised the patient to call if any questions or concerns arise about the hospital admission or medications    Home health was not started in the hospital.  All questions were answered and a follow up appointment was made.   Prior to Admission medications   Medication Sig Start Date End Date Taking? Authorizing Provider  albuterol (PROVENTIL HFA;VENTOLIN HFA) 108 (90 Base) MCG/ACT inhaler Inhale 2 puffs into the lungs every 6 (six) hours as needed for wheezing or shortness of breath. 10/05/18   Nani Skillern, PA-C  albuterol (PROVENTIL) (2.5 MG/3ML) 0.083% nebulizer solution Take 2.5 mg by nebulization every 6 (six) hours as needed for wheezing or shortness of breath.    [provider]  alum & mag hydroxide-simeth (MAALOX/MYLANTA) 200-200-20 MG/5ML suspension Take 15-30 mLs by mouth every 6 (six) hours as needed for indigestion or heartburn.    [provider]  aspirin EC 81 MG tablet Take 81 mg by mouth daily.    [provider]  buPROPion (WELLBUTRIN XL) 150 MG 24 hr tablet Take 1 tablet every morning for Mood Patient taking differently: Take 150 mg by mouth daily. Take 1 tablet every morning for Mood 08/24/18   [name], MD  cetirizine (ZYRTEC) 10 MG tablet Take 10 mg by mouth daily.    [provider]  Cholecalciferol (VITAMIN D-3) 125 MCG (5000 UT) TABS Take 10,000 Units by mouth daily. Please discuss dosage with medical doctor as is a high dose 10/06/18   Lars Pinks M, PA-C  Coenzyme Q10 (COQ10) 200 MG CAPS Take 200 mg by mouth daily.    [provider]  diclofenac sodium (VOLTAREN) 1 % GEL Apply 2 g topically 4 (four) times daily as needed (joint pain).     [provider]  diphenhydrAMINE (BENADRYL) 25 MG tablet Take 25 mg by mouth daily.    [provider]  estradiol (ESTRACE) 1 MG tablet Take 1 mg by mouth daily. 03/06/17   [provider]  ibuprofen (ADVIL,MOTRIN) 200 MG tablet Take 600 mg by mouth every 8 (eight) hours as needed for headache or moderate pain.     [provider]  metoprolol succinate (TOPROL XL) 25 MG 24 hr tablet Take 1 tablet (25 mg total) by mouth daily. 09/02/18   Troy Sine, MD  montelukast (SINGULAIR) 10 MG tablet TAKE 1 TABLET AT BEDTIME Patient taking differently: Take 10 mg by mouth daily.  05/25/18   [name], MD  oxyCODONE (OXY IR/ROXICODONE) 5 MG immediate release tablet Take 1 tablet (5 mg total) by mouth every 4 (four) hours as needed for severe pain. 10/06/18   Nani Skillern, PA-C  rosuvastatin (CRESTOR) 40 MG tablet Take 1 tablet (40 mg total) by mouth daily. 09/13/18   Almyra Deforest, PA  sertraline (ZOLOFT) 100 MG tablet Take 1 tablet daily for Mood Patient taking differently: Take 100 mg by mouth daily.  03/17/18   [name], MD  umeclidinium-vilanterol (ANORO ELLIPTA) 62.5-25 MCG/INH AEPB Inhale 1 puff into the lungs daily. 02/03/18   Deneise Lever, MD

## 2018-10-08 NOTE — Telephone Encounter (Signed)
Patient has follow up office visit. Knows to call with any questions or concerns.   

## 2018-10-11 NOTE — Progress Notes (Signed)
Hospital follow up  Assessment and Plan: Hospital visit follow up for: LUL pulm nodule/non-small cell carcinoma  Karen Barrera was seen today for hospitalization follow-up.  Diagnoses and all orders for this visit:  Malignant neoplasm of upper lobe of left lung (HCC) S/p LUL lobectomy by VATS; stage 1 per pathology report Follow up as scheduled with Dr. Servando Snare  Postprocedural pneumothorax Incision sites doing well; no signs of infection of concerning symptoms at this time Respiratory sounds normal Follow up as scheduled with surgeon   Medication management -     CBC with Differential/Platelet -     COMPLETE METABOLIC PANEL WITH GFR  Anemia due to acute blood loss/ mild -     CBC with Differential/Platelet  Reviewed last labs from routine OV with patient; overall near goal/WNL In light of coronavirus outbreak and high risk history, discussed if outbreak not resolved at time of scheduled follow up appointment to call and postpone to limit possible exposure in community. Encouraged to call with any questions or concerns.   All medications were reviewed with patient and family and fully reconciled. All questions answered fully, and patient and family members were encouraged to call the office with any further questions or concerns. Discussed goal to avoid readmission related to this diagnosis.   There are no discontinued medications.  Over 40 minutes of exam, counseling, chart review, and complex, high/moderate level critical decision making was performed this visit.   Future Appointments  Date Time Provider [place]  10/21/2018  9:00 AM Grace Isaac, MD [facility]  11/19/2018  9:00 AM Deneise Lever, MD [facility] None  11/22/2018 10:45 AM Vicie Mutters, PA-C [facility] None  12/13/2018  9:00 AM Troy Sine, MD [facility]  08/02/2019  2:00 PM Vicie Mutters, PA-C [facility] None     HPI 68 y.o.female presents for follow up for transition from  recent hospitalization. Admit date to the hospital was 09/28/18, patient was discharged from the hospital on 10/06/18 and our clinical staff contacted the office the day after discharge to set up a follow up appointment. The discharge summary, medications, and diagnostic test results were reviewed before meeting with the patient. The patient was admitted for: L upper lobe pulmonary nodule/non-small cell cancer LUL  History of Presenting Illness: Karen Barrera a 68 y.o.femalewith long-term smoking history more than 50 pack/years, known COPD followed by Dr. Annamaria Boots who wasadmitted for evaluation of left upper lobe cyst with increasing solid portionalong with 6 mm nodular density is slowly increased in size on serial CT scans, suspicious for carcinoma of lung. Dr. Servando Snare discussed proceeding with primary resection of LUL lung lesion. She was admitted to [facility] on 09/28/2018 in order to undergo a bronchoscopy, left VATS, wedge LUL followed by completion lobectomy, lymph node dissection, and intercostal nerve block.  On CT scan, she was noted to have significant calcification of her coronary arteries, underwent cardiac CT by Dr. Claiborne Billings and subsequent cardiac catheterization-proceeding with medical therapy for LAD disease.  Brief Hospital Course per Dr. Servando Snare discharge note :  The patient remained afebrile and hemodynamically stable. A line and foley were removed early in the post operative course. Daily chest x rays were obtained and remained stable. Chest tubes were placed to water seal on 03/05.  She had a fair amount of sero sanguinous drainage from the chest tubes. Chest tube output gradually decreased. [name] drain was removed on 03/06. Remaining chest tube had a small air leak. Remaining chest tube appeared to have tidling  more than an air leak so it was clamped the morning of 03/10. Another chest x ray was obtained that afternoon and showed no pneumothorax and subcutaneous emphysema in  the base of the left neck and lower left chest  .  Chest tube was removed on 03/10. Patient has been ambulating on room air. Patient is tolerating a diet and has had a bowel movement. Wounds are clean and dry. Final chest X ray showed small left apical pneumothorax (has had). As discussed with Dr. Servando Snare, the patient is felt surgically stable for discharge today.  Procedure (s):  Bronchoscopy, left video-assisted thoracoscopy, wedge resection, left upper lobe, completion left upper lobectomy with lymph node dissection and intercostal nerve blocks by Dr. Servando Snare on 09/28/2018.  Pathology: Lung, wedge biopsy/resection, Left upper lobe - MUCINOUS ADENOCARCINOMA, 1.2 CM. 2. Lung, resection (segmental or lobe), Left upper lobe - FINDINGS CONSISTENT WITH PREVIOUS WEDGE BIOPSY. - NO RESIDUAL CARCINOMA IN LOBECTOMY. - BRONCHIAL AND VASCULAR MARGINS FREE OF TUMOR. 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 3. Lymph node, biopsy, Level 10L - BENIGN LYMPH NODE. - NO TUMOR IDENTIFIED. 4. Lymph node, biopsy, Level 10L #2 - BENIGN LYMPH NODE. - NO TUMOR IDENTIFIED. 5. Lymph node, biopsy, Level 4L - BENIGN LYMPH NODE. - NO TUMOR IDENTIFIED. 6. Lymph node, biopsy, Level 11L - BENIGN LYMPH NODE. - NO TUMOR IDENTIFIED. 7. Lymph node, biopsy, Level 11L #2 - BENIGN LYMPH NODE. - NO TUMOR IDENTIFIED. 8. Lymph node, biopsy, Level 12L - BENIGN LUNG TISSUE. - NO LYMPH NODE TISSUE OR TUMOR. 9. Lymph node, biopsy, Level 5L - SCANT BENIGN LYMPHOID TISSUE AND BENIGN LUNG. - NO TUMOR IDENTIFIED. TNM Code: pT1b, pN0   The patient presents today reporting doing well since surgery; she was prescribed oxycodone for pain but hasn't needed. No pain other than locally at sutures with coughing. She denies dyspnea, pain, fever/chills, discharge at incision site, heat, swelling. She denies fatigue/malaise, constipation, sore throat.  She does endorse mild intermittent dry cough  since procedure. She has no concerns today and has follow up appointment scheduled with Dr. Servando Snare in 2 weeks on 10/21/2018. She has appointment scheduled to follow up with Dr. Annamaria Boots on 11/19/2018; she has been trialing trelegy ellipta instead of anoro; she reports she has not noted much difference but feels her breathing is doing well.      CBC Latest Ref Rng & Units 09/30/2018 09/29/2018 09/24/2018  WBC 4.0 - 10.5 K/uL 8.9 8.8 7.6  Hemoglobin 12.0 - 15.0 g/dL 11.4(L) 11.4(L) 14.3  Hematocrit 36.0 - 46.0 % 34.6(L) 35.7(L) 44.1  Platelets 150 - 400 K/uL 144(L) 164 179    Component     Latest Ref Rng & Units 09/30/2018  Sodium     135 - 145 mmol/L 136  Potassium     3.5 - 5.1 mmol/L 4.1  Chloride     98 - 111 mmol/L 104  CO2     22 - 32 mmol/L 24  Glucose     70 - 99 mg/dL 109 (H)  BUN     8 - 23 mg/dL 12  Creatinine     0.44 - 1.00 mg/dL 0.88  Calcium     8.9 - 10.3 mg/dL 8.2 (L)  Total Protein     6.5 - 8.1 g/dL 5.6 (L)  Albumin     3.5 - 5.0 g/dL 3.0 (L)  AST     15 - 41 U/L 24  ALT     0 -  44 U/L 19  Alkaline Phosphatase     38 - 126 U/L 46  Total Bilirubin     0.3 - 1.2 mg/dL 0.4  GFR, Est Non African American     >60 mL/min >60  GFR, Est African American     >60 mL/min >60  Anion gap     5 - 15 8    Home health is not involved.   Images while in the hospital: Dg Chest Port 1 View  Result Date: 09/28/2018 CLINICAL DATA:  Postop VATS EXAM: PORTABLE CHEST 1 VIEW COMPARISON:  PET-CT 08/31/2018 FINDINGS: Two chest tubes on the LEFT. No appreciable pneumothorax. Atelectasis in the upper lobe on LEFT. RIGHT lung clear. Central venous line with tip in distal SVC. IMPRESSION: 1. Two LEFT tubes in place without appreciable pneumothorax. 2. No complications following VATS Electronically Signed   By: Suzy Bouchard M.D.   On: 09/28/2018 13:15     Current Outpatient Medications (Endocrine & Metabolic):  .  estradiol (ESTRACE) 1 MG tablet, Take 1 mg by mouth  daily.  Current Outpatient Medications (Cardiovascular):  .  metoprolol succinate (TOPROL XL) 25 MG 24 hr tablet, Take 1 tablet (25 mg total) by mouth daily. .  rosuvastatin (CRESTOR) 40 MG tablet, Take 1 tablet (40 mg total) by mouth daily.  Current Outpatient Medications (Respiratory):  .  albuterol (PROVENTIL HFA;VENTOLIN HFA) 108 (90 Base) MCG/ACT inhaler, Inhale 2 puffs into the lungs every 6 (six) hours as needed for wheezing or shortness of breath. Marland Kitchen  albuterol (PROVENTIL) (2.5 MG/3ML) 0.083% nebulizer solution, Take 2.5 mg by nebulization every 6 (six) hours as needed for wheezing or shortness of breath. .  cetirizine (ZYRTEC) 10 MG tablet, Take 10 mg by mouth daily. .  diphenhydrAMINE (BENADRYL) 25 MG tablet, Take 25 mg by mouth daily. .  montelukast (SINGULAIR) 10 MG tablet, TAKE 1 TABLET AT BEDTIME (Patient taking differently: Take 10 mg by mouth daily. ) .  umeclidinium-vilanterol (ANORO ELLIPTA) 62.5-25 MCG/INH AEPB, Inhale 1 puff into the lungs daily.  Current Outpatient Medications (Analgesics):  .  aspirin EC 81 MG tablet, Take 81 mg by mouth daily. Marland Kitchen  ibuprofen (ADVIL,MOTRIN) 200 MG tablet, Take 600 mg by mouth every 8 (eight) hours as needed for headache or moderate pain.  Marland Kitchen  oxyCODONE (OXY IR/ROXICODONE) 5 MG immediate release tablet, Take 1 tablet (5 mg total) by mouth every 4 (four) hours as needed for severe pain.   Current Outpatient Medications (Other):  .  alum & mag hydroxide-simeth (MAALOX/MYLANTA) 200-200-20 MG/5ML suspension, Take 15-30 mLs by mouth every 6 (six) hours as needed for indigestion or heartburn. Marland Kitchen  buPROPion (WELLBUTRIN XL) 150 MG 24 hr tablet, Take 1 tablet every morning for Mood (Patient taking differently: Take 150 mg by mouth daily. Take 1 tablet every morning for Mood) .  Cholecalciferol (VITAMIN D-3) 125 MCG (5000 UT) TABS, Take 10,000 Units by mouth daily. Please discuss dosage with medical doctor as is a high dose .  Coenzyme Q10 (COQ10) 200  MG CAPS, Take 200 mg by mouth daily. .  diclofenac sodium (VOLTAREN) 1 % GEL, Apply 2 g topically 4 (four) times daily as needed (joint pain).  Marland Kitchen  sertraline (ZOLOFT) 100 MG tablet, Take 1 tablet daily for Mood (Patient taking differently: Take 100 mg by mouth daily. )  Past Medical History:  Diagnosis Date  . Acute perforated appendicitis 10/31/2014  . Adenomatous colon polyp   . Allergic rhinitis   . Allergy   .  Anemia   . Arthritis    "fingers" (10/31/2014)  . Chronic bronchitis ([place])    "got it q yr til I had my nose OR" (10/31/2014)  . COPD (chronic obstructive pulmonary disease) ([place])   . Coronary artery disease   . DDD (degenerative disc disease), cervical   . DDD (degenerative disc disease), lumbar   . Depression    "short term when my sister passed away unexpectedly"  . Family history of adverse reaction to anesthesia    "mother is hard to wake up; a little goes a long way w/her"  . Family history of breast cancer   . Family history of colon cancer   . Family history of skin cancer   . GERD (gastroesophageal reflux disease)   . History of stomach ulcers   . Hyperlipidemia    "borderline; RX is preventative" (10/31/2014)  . Kidney stones    "they passed"  . Lung cancer ([place]) 09/28/2018  . Migraine    "used to get them alot; don't get them anymore" (10/31/2014)  . Osteoarthritis of both knees   . Osteopenia   . Pneumonia 2003 X 1  . Pre-diabetes   . Seizures ([place]) ~ 1962 X 1   "from an abscessed wisdom tooth"  . Tobacco dependence   . Vitamin D deficiency      Allergies  Allergen Reactions  . Chantix [Varenicline] Anaphylaxis and Swelling    Makes throat swell up  . Moxifloxacin Hcl In Nacl Other (See Comments)    aches    ROS: Review of Systems  Constitutional: Negative for chills, diaphoresis, fever, malaise/fatigue and weight loss.  HENT: Negative for hearing loss, sore throat and tinnitus.   Eyes: Negative for blurred vision and double vision.  Respiratory:  Positive for cough. Negative for hemoptysis, sputum production, shortness of breath and wheezing.   Cardiovascular: Negative for chest pain, palpitations, orthopnea, claudication and leg swelling.  Gastrointestinal: Negative for abdominal pain, blood in stool, constipation, diarrhea, heartburn, melena, nausea and vomiting.  Genitourinary: Negative.   Musculoskeletal: Negative for falls, joint pain and myalgias.  Skin: Negative for rash.  Neurological: Negative for dizziness, tingling, sensory change, weakness and headaches.  Endo/Heme/Allergies: Negative for polydipsia.  Psychiatric/Behavioral: Negative.   All other systems reviewed and are negative.    Physical Exam: Filed Weights   10/12/18 1401  Weight: 178 lb (80.7 kg)   BP 124/82   Pulse 68   Temp (!) 97.3 F (36.3 C)   Ht 5\' 3"  (1.6 m)   Wt 178 lb (80.7 kg)   SpO2 99%   BMI 31.53 kg/m  General Appearance: Well nourished, in no apparent distress. Eyes: PERRLA, conjunctiva no swelling or erythema ENT/Mouth: Ext aud canals clear, TMs without erythema, bulging. No erythema, swelling, or exudate on post pharynx.  Tonsils not swollen or erythematous. Hearing normal.  Neck: Supple, thyroid normal.  Respiratory: Respiratory effort normal, BS equal bilaterally without rales, rhonchi, wheezing or stridor.  Cardio: RRR with no MRGs. Brisk peripheral pulses without edema.  Abdomen: Soft, + BS.  Non tender, no guarding, rebound, hernias, masses. Lymphatics: Non tender without lymphadenopathy.  Musculoskeletal: Symmetrical strength, normal gait.  Skin: Warm, dry without rashes, lesions, ecchymosis. 2 incision sites to L lateral mid chest, 1.5 cm each with 2 sutures each intact; dried serous discharge at incision site. No erythema excepting mildly at base of sutures.  Neuro: Cranial nerves intact. Normal muscle tone, no cerebellar symptoms.  Psych: Awake and oriented X 3, normal affect, Insight  and Judgment appropriate.     Izora Ribas, NP 2:28 PM [facility]

## 2018-10-12 ENCOUNTER — Other Ambulatory Visit: Payer: Self-pay

## 2018-10-12 ENCOUNTER — Encounter: Payer: Self-pay | Admitting: Adult Health

## 2018-10-12 ENCOUNTER — Ambulatory Visit (INDEPENDENT_AMBULATORY_CARE_PROVIDER_SITE_OTHER): Payer: Medicare Other | Admitting: Adult Health

## 2018-10-12 VITALS — BP 124/82 | HR 68 | Temp 97.3°F | Ht 63.0 in | Wt 178.0 lb

## 2018-10-12 DIAGNOSIS — J95811 Postprocedural pneumothorax: Secondary | ICD-10-CM

## 2018-10-12 DIAGNOSIS — R918 Other nonspecific abnormal finding of lung field: Secondary | ICD-10-CM

## 2018-10-12 DIAGNOSIS — Z79899 Other long term (current) drug therapy: Secondary | ICD-10-CM

## 2018-10-12 DIAGNOSIS — C3412 Malignant neoplasm of upper lobe, left bronchus or lung: Secondary | ICD-10-CM | POA: Diagnosis not present

## 2018-10-12 DIAGNOSIS — D62 Acute posthemorrhagic anemia: Secondary | ICD-10-CM | POA: Diagnosis not present

## 2018-10-12 NOTE — Patient Instructions (Signed)
Video-Assisted Thoracic Surgery, Care After °This sheet gives you information about how to care for yourself after your procedure. Your health care provider may also give you more specific instructions. If you have problems or questions, contact your health care provider. °What can I expect after the procedure? °After the procedure, it is common to have: °· Some pain and soreness in your chest. °· Pain when breathing in (inhaling) and coughing. °· Constipation. °· Fatigue. °· Difficulty sleeping. °Follow these instructions at home: °Preventing pneumonia °· Take deep breaths or do breathing exercises as instructed by your health care provider. Doing this helps prevent lung infection (pneumonia). °· Cough frequently. Coughing may cause discomfort, but it is important to clear mucus (phlegm) and expand your lungs. If it hurts to cough, hold a pillow against your chest or place the palms of both hands on top of the incision (use splinting) when you cough. This may help relieve discomfort. °· If you were given an incentive spirometer, use it as directed. An incentive spirometer is a tool that measures how well you are filling your lungs with each breath. °· Participate in pulmonary rehabilitation as directed by your health care provider. This is a program that combines education, exercise, and support from a team of specialists. The goal is to help you heal and get back to your normal activities as soon as possible. °Medicines °· Take over-the-counter or prescription medicines only as told by your health care provider. °· If you have pain, take pain-relieving medicine before your pain becomes severe. This is important because if your pain is under control, you will be able to breathe and cough more comfortably. °· If you were prescribed an antibiotic medicine, take it as told by your health care provider. Do not stop taking the antibiotic even if you start to feel better. °Activity °· Ask your health care provider what  activities are safe for you. °· Avoid activities that use your chest muscles for at least 3-4 weeks. °· Do not lift anything that is heavier than 10 lb (4.5 kg), or the limit that your health care provider tells you, until he or she says that it is safe. °Incision care °· Follow instructions from your health care provider about how to take care of your incision(s). Make sure you: °? Wash your hands with soap and water before you change your bandage (dressing). If soap and water are not available, use hand sanitizer. °? Change your dressing as told by your health care provider. °? Leave stitches (sutures), skin glue, or adhesive strips in place. These skin closures may need to stay in place for 2 weeks or longer. If adhesive strip edges start to loosen and curl up, you may trim the loose edges. Do not remove adhesive strips completely unless your health care provider tells you to do that. °· Keep your dressing dry until it has been removed. °· Check your incision area every day for signs of infection. Check for: °? Redness, swelling, or pain. °? Fluid or blood. °? Warmth. °? Pus or a bad smell. °Bathing °· Do not take baths, swim, or use a hot tub until your health care provider approves. You may take showers. °· After your dressing has been removed, use soap and water to gently wash your incision area. Do not use anything else to clean your incision(s) unless your health care provider tells you to do this. °Driving ° °· Do not drive until your health care provider approves. °· Do not drive or   use heavy machinery while taking prescription pain medicine. °Eating and drinking °· Eat a healthy, balanced diet as instructed by your health care provider. A healthy diet includes plenty of fresh fruits and vegetables, whole grains, and low-fat (lean) proteins. °· Limit foods that are high in fat and processed sugars, such as fried and sweet foods. °· Drink enough fluid to keep your urine clear or pale yellow. °General  instructions ° °· To prevent or treat constipation while you are taking prescription pain medicine, your health care provider may recommend that you: °? Take over-the-counter or prescription medicines. °? Eat foods that are high in fiber, such as beans, fresh fruits and vegetables, and whole grains. °· Do not use any products that contain nicotine or tobacco, such as cigarettes and e-cigarettes. If you need help quitting, ask your health care provider. °· Avoid secondhand smoke. °· Wear compression stockings as told by your health care provider. These stockings help to prevent blood clots and reduce swelling in your legs. °· If you have a chest tube, care for it as instructed by your health care provider. Do not travel by airplane during the 2 weeks after your chest tube is removed, or until your health care provider says that this is safe. °· Keep all follow-up visits as told by your health care provider. This is important. °Contact a health care provider if: °· You have redness, swelling, or pain around an incision. °· You have fluid or blood coming from an incision. °· Your incision area feels warm to the touch. °· You have pus or a bad smell coming from an incision. °· You have a fever or chills. °· You have nausea or vomiting. °· You have pain that does not get better with medicine. °Get help right away if: °· You have chest pain. °· Your heart is fluttering or beating rapidly. °· You develop a rash. °· You have shortness of breath or trouble breathing. °· You are confused. °· You have trouble speaking. °· You feel weak, light-headed, or dizzy. °· You faint. °Summary °· To help prevent lung infection (pneumonia), take deep breaths or do breathing exercises as instructed by your health care provider. °· Cough frequently to clear mucus (phlegm) and expand your lungs. If it hurts to cough, hold a pillow against your chest or place the palms of both hands on top of the incision (use splinting) when you cough. °· If  you have pain, take pain-relieving medicine before your pain becomes severe. This is important because if your pain is under control, you will be able to breathe and cough more comfortably. °· Ask your health care provider what activities are safe for you. °This information is not intended to replace advice given to you by your health care provider. Make sure you discuss any questions you have with your health care provider. °Document Released: 11/08/2012 Document Revised: 06/23/2016 Document Reviewed: 06/23/2016 °[employer]vier Interactive Patient Education © 2019 [employer] ° °

## 2018-10-13 LAB — CBC WITH DIFFERENTIAL/PLATELET
Absolute Monocytes: 799 cells/uL (ref 200–950)
Basophils Absolute: 144 cells/uL (ref 0–200)
Basophils Relative: 1.3 %
Eosinophils Absolute: 866 cells/uL — ABNORMAL HIGH (ref 15–500)
Eosinophils Relative: 7.8 %
HCT: 37.8 % (ref 35.0–45.0)
Hemoglobin: 12.7 g/dL (ref 11.7–15.5)
Lymphs Abs: 1976 cells/uL (ref 850–3900)
MCH: 30.1 pg (ref 27.0–33.0)
MCHC: 33.6 g/dL (ref 32.0–36.0)
MCV: 89.6 fL (ref 80.0–100.0)
MPV: 9.8 fL (ref 7.5–12.5)
Monocytes Relative: 7.2 %
Neutro Abs: 7315 cells/uL (ref 1500–7800)
Neutrophils Relative %: 65.9 %
Platelets: 379 10*3/uL (ref 140–400)
RBC: 4.22 10*6/uL (ref 3.80–5.10)
RDW: 12.6 % (ref 11.0–15.0)
Total Lymphocyte: 17.8 %
WBC: 11.1 10*3/uL — ABNORMAL HIGH (ref 3.8–10.8)

## 2018-10-13 LAB — COMPLETE METABOLIC PANEL WITH GFR
AG Ratio: 1.6 (calc) (ref 1.0–2.5)
ALT: 22 U/L (ref 6–29)
AST: 15 U/L (ref 10–35)
Albumin: 3.7 g/dL (ref 3.6–5.1)
Alkaline phosphatase (APISO): 75 U/L (ref 37–153)
BUN: 16 mg/dL (ref 7–25)
CO2: 26 mmol/L (ref 20–32)
Calcium: 9 mg/dL (ref 8.6–10.4)
Chloride: 104 mmol/L (ref 98–110)
Creat: 0.86 mg/dL (ref 0.50–0.99)
GFR, Est African American: 81 mL/min/{1.73_m2} (ref >=60)
GFR, Est Non African American: 70 mL/min/{1.73_m2} (ref >=60)
Globulin: 2.3 g/dL (calc) (ref 1.9–3.7)
Glucose, Bld: 79 mg/dL (ref 65–99)
Potassium: 4.5 mmol/L (ref 3.5–5.3)
Sodium: 138 mmol/L (ref 135–146)
Total Bilirubin: 0.2 mg/dL (ref 0.2–1.2)
Total Protein: 6 g/dL — ABNORMAL LOW (ref 6.1–8.1)

## 2018-10-20 ENCOUNTER — Other Ambulatory Visit: Payer: Self-pay | Admitting: Cardiothoracic Surgery

## 2018-10-20 ENCOUNTER — Other Ambulatory Visit: Payer: Self-pay

## 2018-10-20 DIAGNOSIS — C3412 Malignant neoplasm of upper lobe, left bronchus or lung: Secondary | ICD-10-CM

## 2018-10-21 ENCOUNTER — Ambulatory Visit (INDEPENDENT_AMBULATORY_CARE_PROVIDER_SITE_OTHER): Payer: Self-pay | Admitting: Cardiothoracic Surgery

## 2018-10-21 ENCOUNTER — Ambulatory Visit
Admission: RE | Admit: 2018-10-21 | Discharge: 2018-10-21 | Disposition: A | Discharge status: Home / Self Care | Payer: Medicare Other | Source: Ambulatory Visit | Attending: Cardiothoracic Surgery | Admitting: Cardiothoracic Surgery

## 2018-10-21 VITALS — BP 100/70 | HR 72 | Temp 97.8°F | Resp 20 | Ht 63.0 in | Wt 175.0 lb

## 2018-10-21 DIAGNOSIS — C3412 Malignant neoplasm of upper lobe, left bronchus or lung: Secondary | ICD-10-CM

## 2018-10-21 DIAGNOSIS — Z902 Acquired absence of lung [part of]: Secondary | ICD-10-CM

## 2018-10-21 DIAGNOSIS — J90 Pleural effusion, not elsewhere classified: Secondary | ICD-10-CM | POA: Diagnosis not present

## 2018-10-21 NOTE — Progress Notes (Signed)
[address]       [place] [address]             [phone]      Karen Barrera Medical Record #[id number] Date of Birth: November 14, 1950  Referring: Deneise Lever, MD Primary Care: [name], MD Primary Cardiologist: Shelva Majestic, MD   Chief Complaint:   POST OP FOLLOW UP 09/28/2018 PREOPERATIVE DIAGNOSIS:  Left upper lobe lung mass. POSTOPERATIVE DIAGNOSIS:  Left upper lobe lung mass, nonsmall-cell lung cancer by frozen section. PROCEDURE PERFORMED:  Bronchoscopy, left video-assisted thoracoscopy, wedge resection, left upper lobe, completion left upper lobectomy with lymph node dissection and intercostal nerve blocks. SURGEON:  Lanelle Bal, MD  History of Present Illness:     Patient returns to the office after recent left upper lobectomy for stage Ia 2 non-small cell carcinoma of the lung.  Since discharge she has been doing well.  Increase in physical activity appropriately.  She has had no fever chills.  Slight dry cough.  She is using occasional Tylenol for pain control. Cancer Staging Lung cancer [facility]) Staging form: Lung, AJCC 8th Edition - Pathologic stage from 09/29/2018: Stage IA2 (pT1b, pN0, cM0) - Signed by Grace Isaac, MD on 09/29/2018    Past Medical History:  Diagnosis Date  . Acute perforated appendicitis 10/31/2014  . Adenomatous colon polyp   . Allergic rhinitis   . Allergy   . Anemia   . Arthritis    "fingers" (10/31/2014)  . Chronic bronchitis ([place])    "got it q yr til I had my nose OR" (10/31/2014)  . COPD (chronic obstructive pulmonary disease) ([place])   . Coronary artery disease   . DDD (degenerative disc disease), cervical   . DDD (degenerative disc disease), lumbar   . Depression    "short term when my sister passed away unexpectedly"  . Family history of adverse reaction to anesthesia    "mother is hard to wake up; a little goes a long way w/her"  . Family history of breast cancer   . Family  history of colon cancer   . Family history of skin cancer   . GERD (gastroesophageal reflux disease)   . History of stomach ulcers   . Hyperlipidemia    "borderline; RX is preventative" (10/31/2014)  . Kidney stones    "they passed"  . Lung cancer ([place]) 09/28/2018  . Migraine    "used to get them alot; don't get them anymore" (10/31/2014)  . Osteoarthritis of both knees   . Osteopenia   . Pneumonia 2003 X 1  . Pre-diabetes   . Seizures ([place]) ~ 1962 X 1   "from an abscessed wisdom tooth"  . Tobacco dependence   . Vitamin D deficiency      Social History   Tobacco Use  Smoking Status Former Smoker  . Packs/day: 0.25  . Years: 25.00  . Pack years: 6.25  . Types: Cigarettes  . Last attempt to quit: 09/27/2018  . Years since quitting: 0.0  Smokeless Tobacco Never Used  Tobacco Comment   Pt currently smoking .75ppd as of 06/05/17    Social History   Substance and Sexual Activity  Alcohol Use No  . Alcohol/week: 0.0 standard drinks     Allergies  Allergen Reactions  . Chantix [Varenicline] Anaphylaxis and Swelling    Makes throat swell up  . Moxifloxacin Hcl In Nacl Other (See Comments)    aches    Current Outpatient  Medications  Medication Sig Dispense Refill  . albuterol (PROVENTIL HFA;VENTOLIN HFA) 108 (90 Base) MCG/ACT inhaler Inhale 2 puffs into the lungs every 6 (six) hours as needed for wheezing or shortness of breath.    Marland Kitchen albuterol (PROVENTIL) (2.5 MG/3ML) 0.083% nebulizer solution Take 2.5 mg by nebulization every 6 (six) hours as needed for wheezing or shortness of breath.    Marland Kitchen alum & mag hydroxide-simeth (MAALOX/MYLANTA) 200-200-20 MG/5ML suspension Take 15-30 mLs by mouth every 6 (six) hours as needed for indigestion or heartburn.    Marland Kitchen aspirin EC 81 MG tablet Take 81 mg by mouth daily.    Marland Kitchen buPROPion (WELLBUTRIN XL) 150 MG 24 hr tablet Take 1 tablet every morning for Mood (Patient taking differently: Take 150 mg by mouth daily. Take 1 tablet every morning for  Mood) 90 tablet 1  . cetirizine (ZYRTEC) 10 MG tablet Take 10 mg by mouth daily.    . Cholecalciferol (VITAMIN D-3) 125 MCG (5000 UT) TABS Take 10,000 Units by mouth daily. Please discuss dosage with medical doctor as is a high dose 30 tablet 0  . Coenzyme Q10 (COQ10) 200 MG CAPS Take 200 mg by mouth daily.    . diclofenac sodium (VOLTAREN) 1 % GEL Apply 2 g topically 4 (four) times daily as needed (joint pain).     Marland Kitchen diphenhydrAMINE (BENADRYL) 25 MG tablet Take 25 mg by mouth daily.    Marland Kitchen estradiol (ESTRACE) 1 MG tablet Take 1 mg by mouth daily.  3  . ibuprofen (ADVIL,MOTRIN) 200 MG tablet Take 600 mg by mouth every 8 (eight) hours as needed for headache or moderate pain.     . metoprolol succinate (TOPROL XL) 25 MG 24 hr tablet Take 1 tablet (25 mg total) by mouth daily.    . montelukast (SINGULAIR) 10 MG tablet TAKE 1 TABLET AT BEDTIME (Patient taking differently: Take 10 mg by mouth daily. ) 90 tablet 1  . oxyCODONE (OXY IR/ROXICODONE) 5 MG immediate release tablet Take 1 tablet (5 mg total) by mouth every 4 (four) hours as needed for severe pain. 30 tablet 0  . rosuvastatin (CRESTOR) 40 MG tablet Take 1 tablet (40 mg total) by mouth daily. 90 tablet 1  . sertraline (ZOLOFT) 100 MG tablet Take 1 tablet daily for Mood (Patient taking differently: Take 100 mg by mouth daily. ) 90 tablet 3  . umeclidinium-vilanterol (ANORO ELLIPTA) 62.5-25 MCG/INH AEPB Inhale 1 puff into the lungs daily. 3 each 3   No current facility-administered medications for this visit.        Physical Exam: BP 100/70   Pulse 72   Temp 97.8 F (36.6 C) (Oral)   Resp 20   Ht 5\' 3"  (1.6 m)   Wt 175 lb (79.4 kg)   SpO2 95% Comment: RA  BMI 31.00 kg/m   General appearance: alert and cooperative Neurologic: intact Heart: regular rate and rhythm, S1, S2 normal, no murmur, click, rub or gallop Lungs: clear to auscultation bilaterally Abdomen: soft, non-tender; bowel sounds normal; no masses,  no organomegaly  Extremities: extremities normal, atraumatic, no cyanosis or edema Wound: Incisions are well-healed intact without evidence of infection   Diagnostic Studies & Laboratory data:     Recent Radiology Findings:   Dg Chest 2 View  Result Date: 10/21/2018 CLINICAL DATA:  Malignant neoplasm left upper lobe EXAM: CHEST - 2 VIEW COMPARISON:  10/06/2018 FINDINGS: Postop changes left upper lobectomy. Small left pleural effusion has progressed. Mild left lower lobe atelectasis. No pneumothorax.  Subcutaneous gas on the left has resolved. Right lung remains clear. IMPRESSION: Mild progression of left pleural effusion which is small. No pneumothorax. Electronically Signed   By: Franchot Gallo M.D.   On: 10/21/2018 08:33    I have independently reviewed the above radiology studies  and reviewed the findings with the patient.    Recent Lab Findings: Lab Results  Component Value Date   WBC 11.1 (H) 10/12/2018   HGB 12.7 10/12/2018   HCT 37.8 10/12/2018   PLT 379 10/12/2018   GLUCOSE 79 10/12/2018   CHOL 200 (H) 08/11/2018   TRIG 154 (H) 08/11/2018   HDL 76 08/11/2018   LDLCALC 98 08/11/2018   ALT 22 10/12/2018   AST 15 10/12/2018   NA 138 10/12/2018   K 4.5 10/12/2018   CL 104 10/12/2018   CREATININE 0.86 10/12/2018   BUN 16 10/12/2018   CO2 26 10/12/2018   TSH 2.10 08/11/2018   INR 1.1 09/24/2018   HGBA1C 5.9 (H) 08/11/2018      Assessment / Plan:      Patient stable after left upper lobectomy for stage I non-small cell lung cancer, her case was reviewed at multidisciplinary thoracic oncology conference, no further treatment with radiation or chemotherapy was indicated.  Patient will be continued to be followed  Plan postop follow-up in 8 weeks and tentative CT scan 6 months postop  Patient was allowed to return to driving    Grace Isaac MD      [address] [place] [address] Office [phone]   Beeper [phone]  10/21/2018 9:45 AM

## 2018-11-08 ENCOUNTER — Other Ambulatory Visit: Payer: Self-pay

## 2018-11-08 ENCOUNTER — Ambulatory Visit (INDEPENDENT_AMBULATORY_CARE_PROVIDER_SITE_OTHER): Payer: Medicare Other | Admitting: Acute Care

## 2018-11-08 ENCOUNTER — Encounter: Payer: Self-pay | Admitting: Acute Care

## 2018-11-08 DIAGNOSIS — F3342 Major depressive disorder, recurrent, in full remission: Secondary | ICD-10-CM

## 2018-11-08 DIAGNOSIS — J449 Chronic obstructive pulmonary disease, unspecified: Secondary | ICD-10-CM

## 2018-11-08 MED ORDER — SERTRALINE HCL 100 MG PO TABS: ORAL_TABLET | ORAL | 3 refills | Status: DC

## 2018-11-08 NOTE — Patient Instructions (Addendum)
Congratulations on quitting smoking ( 09/27/2018) I am glad you are doing well after your surgery. Follow up with Dr. Servando Snare 5/21 as is scheduled. Please continue to remain smoke free. We will do a Therapeutic Trial off the Anoro as you are not sure it is helping you. Remember to evaluate how you are doing off the medication at rest and with exertion. If you find you need it, restart medication right away. We will do a Follow up Tele visit in 12/08/2018  At 9 am  to see how you are doing. We will send you the paperwork for [place] financial assistance to see if you qualify for a lower payment for your Anoro Call you insurance company to see which medication in the [facility] Drug class is preferred. Let us know if there is a better option cost wise, and we will send in a prescription. Continue walking as you have been doing. Make sure you use a mask when you are outside. Continue to self isolate, wash hand frequently, and wear a face mask if you leave your home. Remember to utilize the early hours at grocery stores/ drug stores  for people > 3 years old or any underlying health risks, as the stores are cleaner and less crowded at these times. Tele visit follow up 12/08/2018 at 9 am We will schedule a follow up with Dr. Annamaria Boots in July or August at your May visit. Please contact office for sooner follow up if symptoms do not improve or worsen or seek emergency care

## 2018-11-08 NOTE — Progress Notes (Signed)
Virtual Visit via Telephone Note  I connected with Karen Barrera on 11/08/18 at  9:00 AM EDT by telephone and verified that I am speaking with the correct person using two identifiers.   I discussed the limitations, risks, security and privacy concerns of performing an evaluation and management service by telephone and the availability of in person appointments. I also discussed with the patient that there may be a patient responsible charge related to this service. The patient expressed understanding and agreed to proceed.  I  confirmed date of birth and address to authenticate patient  identity. My nurse Quentin Ore reviewed medications and ordered any refills required.  Synopsis: 68 year old former smoker ( Quit 09/27/2018) with a 50 pack year smoking history with mild obstructive disease. She had a LUL VATS on 09/28/2018 for non-small cell lung cancer 09/28/2018>> Dr. Servando Snare.She has remarkably normal PFT's despite her smoking history. She is on Anoro as maintenance which she would like to stop using.She is followed by Dr. Annamaria Boots.  History of Present Illness: Mild Obstructive Disease/COPD Pt. Presents for a 4 month follow up. She was last seen by Dr. Annamaria Boots 05/2018. She had a LUL VATS 09/28/2018 by Dr. Servando Snare for non-small cell lung cancer. ( Adenocarcinoma per surgical path).She is recovering well. She had her follow up appointment with Dr. Servando Snare 10/21/2018, and states he was very happy with her progress. She is walking to and from her mailbox daily, which is about 100 yards each way.She follows up with him again on 12/16/2018.She wants to stop taking  Anoro due to cost, as she states she does not feel it is making much of a difference in her breathing. She has been on Anoro for 2 years. We discussed financial assistance through  [place], and her calling her insurance to determine the preferred anticholinergic on her plan. She states she rarely uses her rescue inhaler. She is back to her normal  activities.She states she is wearing a mask whenever she ventures out of the house.She denies fever, chest pain, orthopnea and hemoptysis.    Observations/Objective: Pulmonary Function Diagnosis:12/20/2018 Minimal Obstructive Airways Disease Insignificant response to bronchodilator Normal lung volumes Normal Diffusion Allergy Profile 06/16/13 Total IgE 41.7   Pos dust mite, dog, [place] grass, ragwee Office Spirometry 06/13/2015-within normal limits. FVC 3.55/117%, FEV1 2.64/111%, FEV1/FVC 0.74 PFT 10/31/2013-within normal limits with no response to bronchodilator. FVC 3.91/120%, FEV1 3.08/123%, FEV1/FEC 0.79, FEF 25-75% 2.90/129%. TLC 113%, DLCO 86%. 6 minute walk test 10/31/2013-94%, 97%, 97%, 480 m. Normal oxygenation. Office Spirometry 06/13/2015-within normal limits. FVC 3.55/117%, FEV1 2.64/111%, FEV1/FVC 0.74 Assessment and Plan:  Mild Obstructive Disease Would like to do a therapeutic trial off Anoro Cost is an issue, and she is unsure if it is helping much Plan: We will do a Therapeutic Trial off the Anoro as you are not sure it is helping you. Remember to evaluate how you are doing off the medication at rest and with exertion. If you find you need it, restart medication right away. We will do a Follow up Tele visit in 12/08/2018  At 9 am  to see how you are doing. We will send you the paperwork for [place] financial assistance to see if you qualify for a lower payment for your Anoro Call you insurance company to see which medication in the [facility] Drug class is preferred. Let us know if there is a better option cost wise, and we will send in a prescription. Continue walking as you have been doing. Make sure  you use a mask when you are outside. Continue to self isolate, wash hand frequently, and wear a face mask if you leave your home. Remember to utilize the early hours at grocery stores/ drug stores  for people > 31 years old or any underlying health risks, as the stores are cleaner  and less crowded at these times. Tele visit follow up 12/08/2018 at 9 am We will schedule a follow up with Dr. Annamaria Boots in July or August at your May visit. Please contact office for sooner follow up if symptoms do not improve or worsen or seek emergency care   Adenocarcinoma with LUL VATS  On 09/28/2018 ( Stage 1) Case reviewed at [facility]: No further treatment with radiation or chemotherapy was indicated. Doing well Back to pre-op baseline Plan: Continued follow up with Dr. Servando Snare as scheduled   Patient will continue to be followed by Dr. Servando Snare Plan is for  postop follow-up in 11/2018 and tentative CT scan 6 months postop  Tobacco Abuse 50 pack year history Quit 09/27/2018 Plan: I have spent 3 minutes counseling patient on smoking cessation this visit. Patient verbalizes understanding of their importance of her  choice to continue to remain smoke free. We reviewed smoking and its negative health consequences including worsening of COPD, risk of lung cancer , stroke and heart disease.She verbalized understanding..    Follow Up Instructions: May 13 th at 9 am follow up tele visit with Judson Roch NP to evaluate how you are doing off Anoro If you feel you need to resume therapy do so right away.   I discussed the assessment and treatment plan with the patient. The patient was provided an opportunity to ask questions and all were answered. The patient agreed with the plan and demonstrated an understanding of the instructions.   The patient was advised to call back or seek an in-person evaluation if the symptoms worsen or if the condition fails to improve as anticipated.  I provided 25 minutes of non-face-to-face time during this encounter.   Magdalen Spatz, NP 11/08/2018 12:18 PM

## 2018-11-10 ENCOUNTER — Other Ambulatory Visit: Payer: Self-pay | Admitting: Internal Medicine

## 2018-11-18 NOTE — Progress Notes (Signed)
MEDICARE ANNUAL WELLNESS VISIT AND 3 MONTH   Assessment:   Encounter for Medicare annual wellness exam 1 year  Aortic atherosclerosis ([place]) Control blood pressure, cholesterol, glucose, increase exercise.   COPD mixed type ([place]) No triggers, well controlled symptoms, cont to monitor  Malignant neoplasm of upper lobe of left lung ([place]) S/p lobectomy, continue follow up  Recurrent major depressive disorder, in full remission ([place]) -     TSH  Mixed hyperlipidemia -     Lipid panel check lipids decrease fatty foods increase activity.   Anemia, unspecified type -     CBC with Differential/Platelet -     Iron,Total/Total Iron Binding Cap -     Ferritin - monitor, continue iron supp with Vitamin C and increase green leafy veggies  Vitamin D deficiency Continue supplement  Medication management -     CBC with Differential/Platelet -     COMPLETE METABOLIC PANEL WITH GFR -     Magnesium  Class 1 obesity due to excess calories with serious comorbidity and body mass index (BMI) of 30.0 to 30.9 in adult - follow up 3 months for progress monitoring - increase veggies, decrease carbs - long discussion about weight loss, diet, and exercise  Fatty liver -     COMPLETE METABOLIC PANEL WITH GFR Weight loss advised, will monitor LFTs  Agatston coronary artery calcium score greater than 400 Control blood pressure, cholesterol, glucose, increase exercise.   Hot flashes Continue meds  Seasonal and perennial allergic rhinitis Allergic rhinitis - Allegra OTC, increase H20, allergy hygiene explained.  Costochondritis Get lidocaine patches  Hx of adenomatous colonic polyps UTD  Genetic testing Negative  Cough -     omeprazole (PRILOSEC) 40 MG capsule; Take 1 capsule (40 mg total) by mouth daily. Continue anoro, try PPI, if not better may want to try increase to trelegy/nasocort  Abnormal glucose -     Hemoglobin A1c Discussed disease progression and risks Discussed  diet/exercise, weight management and risk modification  B12 deficiency -     Vitamin B12  Over 40 minutes of exam, counseling, chart review and critical decision making was performed Future Appointments  Date Time Provider [place]  12/08/2018  9:00 AM Magdalen Spatz, NP [facility] None  12/13/2018  9:00 AM Troy Sine, MD [facility]  12/16/2018  9:00 AM Grace Isaac, MD [facility]  03/07/2019 11:30 AM [name], MD [facility] None  08/02/2019  2:00 PM Vicie Mutters, PA-C [facility] None  12/01/2019  9:00 AM Vicie Mutters, PA-C [facility] None     Plan:   During the course of the visit the patient was educated and counseled about appropriate screening and preventive services including:    Pneumococcal vaccine   Prevnar 13  Influenza vaccine  Td vaccine  Screening electrocardiogram  Bone densitometry screening  Colorectal cancer screening  Diabetes screening  Glaucoma screening  Nutrition counseling   Advanced directives: requested   Subjective:  Karen Barrera is a 68 y.o. female who presents for Medicare Annual Wellness Visit and 3 month follow up    Her blood pressure has been controlled at home, today their BP is BP: 126/84 She does workout but trying to walk inside and workout inside due to pollen. She denies chest pain, shortness of breath, dizziness.   She has COPD, s/p left lobectomy for stage 2 non small cell carcinoma on 09/28/2018 with Dr. Servando Snare, she continues to have some left sided nerve pain. She has not smoked since  March 3rd. She does complain of dry cough x 6 months, she is back on anoro, she is on zyrtec, singulair, and not on anything for GERD. She occ has reflux, has been taking aleve.   She does have history of stress incontinence.   She complains of left leg pain, worse at night, better with certain positions.   BMI is Body mass index is 32.59 kg/m., she is working on diet and exercise. Wt  Readings from Last 3 Encounters:  11/22/18 184 lb (83.5 kg)  10/21/18 175 lb (79.4 kg)  10/12/18 178 lb (80.7 kg)   She is on cholesterol medication and denies myalgias. Her cholesterol is at goal. The cholesterol last visit was:   Lab Results  Component Value Date   CHOL 200 (H) 08/11/2018   HDL 76 08/11/2018   LDLCALC 98 08/11/2018   TRIG 154 (H) 08/11/2018   CHOLHDL 2.6 08/11/2018   Last A1C  Lab Results  Component Value Date   HGBA1C 5.9 (H) 08/11/2018   Last GFR: Lab Results  Component Value Date   GFRNONAA 70 10/12/2018   Patient is on Vitamin D supplement.   Lab Results  Component Value Date   VD25OH 52 08/11/2018     She was on estrogen pills from GYN and lexapro for hot flashes/depression which is helping some, she is on low dose ASA.   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    Medication Review: Current Outpatient Medications on File Prior to Visit  Medication Sig Dispense Refill  . albuterol (PROVENTIL HFA;VENTOLIN HFA) 108 (90 Base) MCG/ACT inhaler Inhale 2 puffs into the lungs every 6 (six) hours as needed for wheezing or shortness of breath.    Marland Kitchen albuterol (PROVENTIL) (2.5 MG/3ML) 0.083% nebulizer solution Take 2.5 mg by nebulization every 6 (six) hours as needed for wheezing or shortness of breath.    Marland Kitchen alum & mag hydroxide-simeth (MAALOX/MYLANTA) 200-200-20 MG/5ML suspension Take 15-30 mLs by mouth every 6 (six) hours as needed for indigestion or heartburn.    Marland Kitchen aspirin EC 81 MG tablet Take 81 mg by mouth daily.    Marland Kitchen buPROPion (WELLBUTRIN XL) 150 MG 24 hr tablet Take 1 tablet every morning for Mood (Patient taking differently: Take 150 mg by mouth daily. Take 1 tablet every morning for Mood) 90 tablet 1  . cetirizine (ZYRTEC) 10 MG tablet Take 10 mg by mouth daily.    . Cholecalciferol (VITAMIN D-3) 125 MCG (5000 UT) TABS Take 10,000 Units by mouth  daily. Please discuss dosage with medical doctor as is a high dose 30 tablet 0  . Coenzyme Q10 (COQ10) 200 MG CAPS Take 200 mg by mouth daily.    . diclofenac sodium (VOLTAREN) 1 % GEL Apply 2 g topically 4 (four) times daily as needed (joint pain).     Marland Kitchen diphenhydrAMINE (BENADRYL) 25 MG tablet Take 25 mg by mouth daily.    Marland Kitchen estradiol (ESTRACE) 1 MG tablet Take 1 mg by mouth daily.  3  . ibuprofen (ADVIL,MOTRIN) 200 MG tablet Take 600 mg by mouth every 8 (eight) hours as needed for headache or moderate pain.     . metoprolol succinate (TOPROL XL) 25 MG 24 hr tablet Take 1 tablet (25 mg total) by mouth daily.    . montelukast (SINGULAIR) 10 MG tablet TAKE 1 TABLET AT BEDTIME 90 tablet 1  .  oxyCODONE (OXY IR/ROXICODONE) 5 MG immediate release tablet Take 1 tablet (5 mg total) by mouth every 4 (four) hours as needed for severe pain. 30 tablet 0  . rosuvastatin (CRESTOR) 40 MG tablet Take 1 tablet (40 mg total) by mouth daily. 90 tablet 1  . sertraline (ZOLOFT) 100 MG tablet Take 1 tablet daily for Mood 90 tablet 3  . umeclidinium-vilanterol (ANORO ELLIPTA) 62.5-25 MCG/INH AEPB Inhale 1 puff into the lungs daily. 3 each 3   No current facility-administered medications on file prior to visit.     Allergies  Allergen Reactions  . Chantix [Varenicline] Anaphylaxis and Swelling    Makes throat swell up  . Moxifloxacin Hcl In Nacl Other (See Comments)    aches    Current Problems (verified) Patient Active Problem List   Diagnosis Date Noted  . Lung cancer ([place]) 09/28/2018  . Agatston coronary artery calcium score greater than 400   . Fatty liver 08/29/2018  . Aortic atherosclerosis ([place]) 08/06/2017  . Genetic testing 04/24/2017  . Hot flashes 11/12/2016  . Obesity 11/12/2016  . Encounter for Medicare annual wellness exam 11/12/2016  . COPD mixed type ([place]) 06/05/2016  . Costochondritis 03/23/2015  . Hx of adenomatous colonic polyps 01/23/2015  . Medication management 11/21/2014  .  Depression   . Hyperlipidemia   . Anemia   . Vitamin D deficiency   . Tobacco dependence   . Seasonal and perennial allergic rhinitis 07/03/2013    Screening Tests Immunization History  Administered Date(s) Administered  . Influenza Split 05/16/2013, 05/12/2014, 05/14/2015  . Influenza, High Dose Seasonal PF 05/15/2017, 05/07/2018  . Influenza, Seasonal, Injecte, Preservative Fre 05/13/2016  . Pneumococcal Conjugate-13 11/12/2016  . Pneumococcal Polysaccharide-23 06/14/2011  . Tdap 03/27/2010  . Zoster 04/14/2012   Tetanus: 2011 Pneumovax:  2012 Prevnar 13: 2018 Flu vaccine: 2018 Zostavax: 2013  MGM: 03/10/2017   at GYN DEXA: 2017  At GYN Osteopenia PAP 2016 never abnormal, last one Colonoscopy: 2016, due in 5 years/2021 Dr. Carlean Purl PFT 05/2016 CT chest 05/2017  Names of Other Physician/Practitioners you currently use: 1. [facility] here for primary care 2. Dr. Dorena Cookey, eye doctor, last visit 2018 3. Dr. Luretha Rued, dentist, last visit Feb 2019 Patient Care Team: [name], MD as PCP - General (Internal Medicine) Troy Sine, MD as PCP - Cardiology (Cardiology) Lavonna Monarch, MD as Consulting Physician (Dermatology) Troy Sine, MD as Consulting Physician (Cardiology) Thornell Sartorius, MD as Consulting Physician (Otolaryngology) Izora Gala, MD as Consulting Physician (Otolaryngology) Sable Feil, MD as Consulting Physician (Gastroenterology) Delila Pereyra, MD as Consulting Physician (Gynecology) Gatha Mayer, MD as Consulting Physician (Gastroenterology)  SURGICAL HISTORY She  has a past surgical history that includes Shoulder arthroscopy w/ rotator cuff repair (Left, 2012); Tendon repair (Left, 10/2009); Shoulder arthroscopy (Right, 2003); Salpingoophorectomy (Right, 1999); Sinus exploration (12/2013); Vaginal hysterectomy (1978); laparoscopic appendectomy (N/A, 10/31/2014); Appendectomy; Colonoscopy; Cyst  excision (Right, 01/11/2016); Arthrotomy (Right, 01/11/2016); LEFT HEART CATH AND CORONARY ANGIOGRAPHY (N/A, 09/06/2018); Video bronchoscopy (N/A, 09/28/2018); and Video assisted thoracoscopy (vats)/wedge resection (Left, 09/28/2018). FAMILY HISTORY Her family history includes Arthritis in her paternal grandmother; Breast cancer in an other family member; Breast cancer (age of onset: 56) in an other family member; Cancer in an other family member; Colon cancer in an other family member; Colon cancer (age of onset: 8) in some other family members; Colon polyps in her father; Diabetes in her mother; Hyperlipidemia in her maternal grandfather; Melanoma (age of  onset: 54) in her paternal grandmother; Seizures in her sister; Skin cancer (age of onset: 68) in her mother; Stroke in her maternal grandmother. SOCIAL HISTORY She  reports that she quit smoking about 8 weeks ago. Her smoking use included cigarettes. She has a 50.00 pack-year smoking history. She has never used smokeless tobacco. She reports that she does not drink alcohol or use drugs.   MEDICARE WELLNESS OBJECTIVES: Physical activity: Current Exercise Habits: The patient does not participate in regular exercise at present, Exercise limited by: respiratory conditions(s) Cardiac risk factors: Cardiac Risk Factors include: advanced age (>39men, >52 women);dyslipidemia;hypertension;sedentary lifestyle;smoking/ tobacco exposure Depression/mood screen:   Depression screen [facility] 2/9 11/22/2018  Decreased Interest 1  Down, Depressed, Hopeless 0  PHQ - 2 Score 1    ADLs:  In your present state of health, do you have any difficulty performing the following activities: 11/22/2018 09/28/2018  Hearing? N N  Vision? N N  Difficulty concentrating or making decisions? N N  Walking or climbing stairs? N N  Dressing or bathing? N N  Doing errands, shopping? N N  Some recent data might be hidden     Cognitive Testing  Alert? Yes  Normal Appearance?Yes  Oriented  to person? Yes  Place? Yes   Time? Yes  Recall of three objects?  2/3  Can perform simple calculations? Yes  Displays appropriate judgment?Yes  Can read the correct time from a watch face?Yes  EOL planning: Does Patient Have a Medical Advance Directive?: Yes Type of Advance Directive: Healthcare Power of Attorney, Living will Does patient want to make changes to medical advance directive?: No - Patient declined  Review of Systems  Constitutional: Negative.   HENT: Negative.   Eyes: Negative.   Respiratory: Negative.   Cardiovascular: Negative.   Gastrointestinal: Negative for abdominal pain, blood in stool, constipation, diarrhea, heartburn, melena, nausea and vomiting.  Genitourinary: Negative.   Musculoskeletal: Negative.   Skin: Negative.   Neurological: Negative.   Endo/Heme/Allergies: Negative.   Psychiatric/Behavioral: Positive for memory loss. Negative for depression, hallucinations, substance abuse and suicidal ideas. The patient is nervous/anxious. The patient does not have insomnia.      Objective:     Today's Vitals   11/22/18 1031  BP: 126/84  Pulse: 63  Temp: 97.8 F (36.6 C)  SpO2: 97%  Weight: 184 lb (83.5 kg)  Height: 5\' 3"  (1.6 m)   Body mass index is 32.59 kg/m.  General appearance: alert, no distress, WD/WN, female HEENT: normocephalic, sclerae anicteric, TMs pearly, nares patent, no discharge or erythema, pharynx normal Oral cavity: MMM, no lesions Neck: supple, no lymphadenopathy, no thyromegaly, no masses Heart: RRR, normal S1, S2, no murmurs Lungs: CTA bilaterally, no wheezes, rhonchi, or rales, well healing scar on left flank without warmth, swelling.  Abdomen: +bs, soft, non tender, non distended, no masses, no hepatomegaly, no splenomegaly Musculoskeletal: nontender, no swelling, no obvious deformity Extremities: no edema, no cyanosis, no clubbing Pulses: 2+ symmetric, upper and lower extremities, normal cap refill Neurological: alert,  oriented x 3, CN2-12 intact, strength normal upper extremities and lower extremities, sensation normal throughout, DTRs 2+ throughout, no cerebellar signs, gait normal Psychiatric: normal affect, behavior normal, pleasant   Medicare Attestation I have personally reviewed: The patient's medical and social history Their use of alcohol, tobacco or illicit drugs Their current medications and supplements The patient's functional ability including ADLs,fall risks, home safety risks, cognitive, and hearing and visual impairment Diet and physical activities Evidence for depression or mood disorders  The patient's weight, height, BMI, and visual acuity have been recorded in the chart.  I have made referrals, counseling, and provided education to the patient based on review of the above and I have provided the patient with a written personalized care plan for preventive services.     Vicie Mutters, PA-C   11/22/2018

## 2018-11-19 ENCOUNTER — Ambulatory Visit: Payer: Medicare Other | Admitting: Internal Medicine

## 2018-11-22 ENCOUNTER — Encounter: Payer: Self-pay | Admitting: Physician Assistant

## 2018-11-22 ENCOUNTER — Ambulatory Visit (INDEPENDENT_AMBULATORY_CARE_PROVIDER_SITE_OTHER): Payer: Medicare Other | Admitting: Physician Assistant

## 2018-11-22 ENCOUNTER — Other Ambulatory Visit: Payer: Self-pay

## 2018-11-22 VITALS — BP 126/84 | HR 63 | Temp 97.8°F | Ht 63.0 in | Wt 184.0 lb

## 2018-11-22 DIAGNOSIS — F3342 Major depressive disorder, recurrent, in full remission: Secondary | ICD-10-CM

## 2018-11-22 DIAGNOSIS — J3089 Other allergic rhinitis: Secondary | ICD-10-CM

## 2018-11-22 DIAGNOSIS — E538 Deficiency of other specified B group vitamins: Secondary | ICD-10-CM | POA: Diagnosis not present

## 2018-11-22 DIAGNOSIS — J449 Chronic obstructive pulmonary disease, unspecified: Secondary | ICD-10-CM

## 2018-11-22 DIAGNOSIS — J302 Other seasonal allergic rhinitis: Secondary | ICD-10-CM

## 2018-11-22 DIAGNOSIS — Z86010 Personal history of colonic polyps: Secondary | ICD-10-CM

## 2018-11-22 DIAGNOSIS — C3412 Malignant neoplasm of upper lobe, left bronchus or lung: Secondary | ICD-10-CM

## 2018-11-22 DIAGNOSIS — E6609 Other obesity due to excess calories: Secondary | ICD-10-CM

## 2018-11-22 DIAGNOSIS — Z6830 Body mass index (BMI) 30.0-30.9, adult: Secondary | ICD-10-CM

## 2018-11-22 DIAGNOSIS — I2510 Atherosclerotic heart disease of native coronary artery without angina pectoris: Secondary | ICD-10-CM

## 2018-11-22 DIAGNOSIS — Z79899 Other long term (current) drug therapy: Secondary | ICD-10-CM | POA: Diagnosis not present

## 2018-11-22 DIAGNOSIS — K760 Fatty (change of) liver, not elsewhere classified: Secondary | ICD-10-CM

## 2018-11-22 DIAGNOSIS — R7309 Other abnormal glucose: Secondary | ICD-10-CM

## 2018-11-22 DIAGNOSIS — I700 Atherosclerosis of aorta: Secondary | ICD-10-CM

## 2018-11-22 DIAGNOSIS — Z0000 Encounter for general adult medical examination without abnormal findings: Secondary | ICD-10-CM

## 2018-11-22 DIAGNOSIS — M940 Chondrocostal junction syndrome [Tietze]: Secondary | ICD-10-CM

## 2018-11-22 DIAGNOSIS — R059 Cough, unspecified: Secondary | ICD-10-CM

## 2018-11-22 DIAGNOSIS — Z0001 Encounter for general adult medical examination with abnormal findings: Secondary | ICD-10-CM | POA: Diagnosis not present

## 2018-11-22 DIAGNOSIS — R6889 Other general symptoms and signs: Secondary | ICD-10-CM

## 2018-11-22 DIAGNOSIS — R232 Flushing: Secondary | ICD-10-CM | POA: Diagnosis not present

## 2018-11-22 DIAGNOSIS — R05 Cough: Secondary | ICD-10-CM

## 2018-11-22 DIAGNOSIS — E782 Mixed hyperlipidemia: Secondary | ICD-10-CM

## 2018-11-22 DIAGNOSIS — E559 Vitamin D deficiency, unspecified: Secondary | ICD-10-CM | POA: Diagnosis not present

## 2018-11-22 DIAGNOSIS — D649 Anemia, unspecified: Secondary | ICD-10-CM | POA: Diagnosis not present

## 2018-11-22 DIAGNOSIS — R931 Abnormal findings on diagnostic imaging of heart and coronary circulation: Secondary | ICD-10-CM

## 2018-11-22 DIAGNOSIS — Z1379 Encounter for other screening for genetic and chromosomal anomalies: Secondary | ICD-10-CM

## 2018-11-22 MED ORDER — OMEPRAZOLE 40 MG PO CPDR: 40.0000 mg | DELAYED_RELEASE_CAPSULE | Freq: Every day | ORAL | 0 refills | Status: DC

## 2018-11-22 NOTE — Patient Instructions (Addendum)
Generally a cough is either coming from above or from below- so we will treat this OR it can be from irritation/viral cough  To treat the reflux Will send in prilosec 40 mg to take once in the morning and take prevacid from over the counter at night for 2 weeks- then stop the prilosec and continue the pravacid or famotadine  To stop irritation: Need to STOP the cough Do sugar free candy VOICE REST is VERY important  If not better in 2 weeks will refer to ENT  Go to the ER or call the office if you get any chest pain, shortness of breath, severe  headache, leg swelling.    Common causes of cough OR hoarseness OR sore throat:   Allergies, Viral Infections, Acid Reflux and Bacterial Infections.    Allergies and viral infections cause a cough OR sore throat by post nasal drip and are often worse at night, can also have sneezing, lower grade fevers, clear/yellow mucus. This is best treated with allergy medications or nasal sprays.  Please get on allegra for 1-2 weeks The strongest is allegra or fexafinadine  Cheapest at [employer], [employer], [employer]   Bacterial infections are more severe than allergies or viral infections with fever, teeth pain, fatigue. This can be treated with prednisone and the same over the counter medication and after 7 days can be treated with an antibiotic.   Silent reflux/GERD can cause a cough OR sore throat OR hoarseness WITHOUT heart burn because the esophagus that goes to the stomach and trachea that goes to the lungs are very close and when you lay down the acid can irritate your throat and lungs. This can cause hoarseness, cough, and wheezing. Please stop any alcohol or anti-inflammatories like aleve/advil/ibuprofen and start an over the counter Prilosec or omeprazole 1-2 times daily 55mins before food for 2 weeks, then switch to over the counter zantac/ratinidine or pepcid/famotadine once at night for 2 weeks.    sometimes irritation causes more irritation. Try voice  rest, use sugar free cough drops to prevent coughing, and try to stop clearing your throat.   If you ever have a cough that does not go away after trying these things please make a follow up visit for further evaluation or we can refer you to a specialist. Or if you ever have shortness of breath or chest pain go to the ER.    Silent reflux: Not all heartburn burns...Marland KitchenMarland KitchenMarland Kitchen  What is LPR? Laryngopharyngeal reflux (LPR) or silent reflux is a condition in which acid that is made in the stomach travels up the esophagus (swallowing tube) and gets to the throat. Not everyone with reflux has a lot of heartburn or indigestion. In fact, many people with LPR never have heartburn. This is why LPR is called SILENT REFLUX, and the terms "Silent reflux" and "LPR" are often used interchangeably. Because LPR is silent, it is sometimes difficult to diagnose.  How can you tell if you have LPR?  Marland Kitchen Chronic hoarseness- Some people have hoarseness that comes and goes . throat clearing  . Cough . It can cause shortness of breath and cause asthma like symptoms. Marland Kitchen a feeling of a lump in the throat  . difficulty swallowing . a problem with too much nose and throat drainage.  . Some people will feel their esophagus spasm which feels like their heart beating hard and fast, this will usually be after a meal, at rest, or lying down at night.    How do I  treat this? Treatment for LPR should be individualized, and your doctor will suggest the best treatment for you. Generally there are several treatments for LPR: . changing habits and diet to reduce reflux,  . medications to reduce stomach acid, and  . surgery to prevent reflux. Most people with LPR need to modify how and when they eat, as well as take some medication, to get well. Sometimes, nonprescription liquid antacids, such as Maalox, Gelucil and Mylanta are recommended. When used, these antacids should be taken four times each day - one tablespoon one hour after each  meal and before bedtime. Dietary and lifestyle changes alone are not often enough to control LPR - medications that reduce stomach acid are also usually needed. These must be prescribed by our doctor.   TIPS FOR REDUCING REFLUX AND LPR Control your LIFE-STYLE and your DIET! Marland Kitchen If you use tobacco, QUIT.  Marland Kitchen Smoking makes you reflux. After every cigarette you have some LPR.  . Don't wear clothing that is too tight, especially around the waist (trousers, corsets, belts).  . Do not lie down just after eating...in fact, do not eat within three hours of bedtime.  . You should be on a low-fat diet.  . Limit your intake of red meat.  . Limit your intake of butter.  Marland Kitchen Avoid fried foods.  . Avoid chocolate  . Avoid cheese.  Marland Kitchen Avoid eggs. Marland Kitchen Specifically avoid caffeine (especially coffee and tea), soda pop (especially cola) and mints.  . Avoid alcoholic beverages, particularly in the evening.     When it comes to diets, agreement about the perfect plan isn't easy to find, even among the experts. Experts at the [employer] developed an idea known as the Healthy Eating Plate. Just imagine a plate divided into logical, healthy portions.  The emphasis is on diet quality:  Load up on vegetables and fruits - one-half of your plate: Aim for color and variety, and remember that potatoes don't count.  Go for whole grains - one-quarter of your plate: Whole wheat, barley, wheat berries, quinoa, oats, brown rice, and foods made with them. If you want pasta, go with whole wheat pasta.  Protein power - one-quarter of your plate: Fish, chicken, beans, and nuts are all healthy, versatile protein sources. Limit red meat.  The diet, however, does go beyond the plate, offering a few other suggestions.  Use healthy plant oils, such as olive, canola, soy, corn, sunflower and peanut. Check the labels, and avoid partially hydrogenated oil, which have unhealthy trans fats.  If you're thirsty,  drink water. Coffee and tea are good in moderation, but skip sugary drinks and limit milk and dairy products to one or two daily servings.  The type of carbohydrate in the diet is more important than the amount. Some sources of carbohydrates, such as vegetables, fruits, whole grains, and beans-are healthier than others.  Finally, stay active.

## 2018-11-23 LAB — CBC WITH DIFFERENTIAL/PLATELET
Absolute Monocytes: 546 cells/uL (ref 200–950)
Basophils Absolute: 91 cells/uL (ref 0–200)
Basophils Relative: 1.3 %
Eosinophils Absolute: 133 cells/uL (ref 15–500)
Eosinophils Relative: 1.9 %
HCT: 41 % (ref 35.0–45.0)
Hemoglobin: 13.8 g/dL (ref 11.7–15.5)
Lymphs Abs: 1400 cells/uL (ref 850–3900)
MCH: 29.8 pg (ref 27.0–33.0)
MCHC: 33.7 g/dL (ref 32.0–36.0)
MCV: 88.6 fL (ref 80.0–100.0)
MPV: 10.1 fL (ref 7.5–12.5)
Monocytes Relative: 7.8 %
Neutro Abs: 4830 cells/uL (ref 1500–7800)
Neutrophils Relative %: 69 %
Platelets: 223 10*3/uL (ref 140–400)
RBC: 4.63 10*6/uL (ref 3.80–5.10)
RDW: 13 % (ref 11.0–15.0)
Total Lymphocyte: 20 %
WBC: 7 10*3/uL (ref 3.8–10.8)

## 2018-11-23 LAB — COMPLETE METABOLIC PANEL WITH GFR
AG Ratio: 1.7 (calc) (ref 1.0–2.5)
ALT: 21 U/L (ref 6–29)
AST: 19 U/L (ref 10–35)
Albumin: 4.3 g/dL (ref 3.6–5.1)
Alkaline phosphatase (APISO): 69 U/L (ref 37–153)
BUN: 18 mg/dL (ref 7–25)
CO2: 26 mmol/L (ref 20–32)
Calcium: 9.7 mg/dL (ref 8.6–10.4)
Chloride: 104 mmol/L (ref 98–110)
Creat: 0.93 mg/dL (ref 0.50–0.99)
GFR, Est African American: 74 mL/min/{1.73_m2} (ref >=60)
GFR, Est Non African American: 64 mL/min/{1.73_m2} (ref >=60)
Globulin: 2.6 g/dL (calc) (ref 1.9–3.7)
Glucose, Bld: 92 mg/dL (ref 65–99)
Potassium: 4.5 mmol/L (ref 3.5–5.3)
Sodium: 137 mmol/L (ref 135–146)
Total Bilirubin: 0.3 mg/dL (ref 0.2–1.2)
Total Protein: 6.9 g/dL (ref 6.1–8.1)

## 2018-11-23 LAB — IRON, TOTAL/TOTAL IRON BINDING CAP
%SAT: 19 % (calc) (ref 16–45)
Iron: 69 ug/dL (ref 45–160)

## 2018-11-23 LAB — LIPID PANEL
Cholesterol: 182 mg/dL (ref <200)
HDL: 76 mg/dL (ref >=50)
LDL Cholesterol (Calc): 84 mg/dL (calc)
Non-HDL Cholesterol (Calc): 106 mg/dL (calc) (ref <130)
Total CHOL/HDL Ratio: 2.4 (calc) (ref <5.0)
Triglycerides: 120 mg/dL (ref <150)

## 2018-11-23 LAB — HEMOGLOBIN A1C
Hgb A1c MFr Bld: 6 % of total Hgb — ABNORMAL HIGH (ref <5.7)
Mean Plasma Glucose: 126 (calc)
eAG (mmol/L): 7 (calc)

## 2018-11-23 LAB — TSH: TSH: 2.68 mIU/L (ref 0.40–4.50)

## 2018-11-23 LAB — VITAMIN B12: Vitamin B-12: 605 pg/mL (ref 200–1100)

## 2018-11-23 LAB — MAGNESIUM: Magnesium: 2.1 mg/dL (ref 1.5–2.5)

## 2018-11-23 LAB — FERRITIN: Ferritin: 72 ng/mL (ref 16–288)

## 2018-11-23 LAB — IRON,?TOTAL/TOTAL IRON BINDING CAP: TIBC: 362 mcg/dL (calc) (ref 250–450)

## 2018-11-30 DIAGNOSIS — D2261 Melanocytic nevi of right upper limb, including shoulder: Secondary | ICD-10-CM | POA: Diagnosis not present

## 2018-11-30 DIAGNOSIS — L821 Other seborrheic keratosis: Secondary | ICD-10-CM | POA: Diagnosis not present

## 2018-11-30 DIAGNOSIS — D225 Melanocytic nevi of trunk: Secondary | ICD-10-CM | POA: Diagnosis not present

## 2018-11-30 DIAGNOSIS — D1801 Hemangioma of skin and subcutaneous tissue: Secondary | ICD-10-CM | POA: Diagnosis not present

## 2018-11-30 DIAGNOSIS — D2371 Other benign neoplasm of skin of right lower limb, including hip: Secondary | ICD-10-CM | POA: Diagnosis not present

## 2018-11-30 DIAGNOSIS — D2262 Melanocytic nevi of left upper limb, including shoulder: Secondary | ICD-10-CM | POA: Diagnosis not present

## 2018-11-30 DIAGNOSIS — L820 Inflamed seborrheic keratosis: Secondary | ICD-10-CM | POA: Diagnosis not present

## 2018-11-30 DIAGNOSIS — L812 Freckles: Secondary | ICD-10-CM | POA: Diagnosis not present

## 2018-12-08 ENCOUNTER — Encounter: Payer: Self-pay | Admitting: Acute Care

## 2018-12-08 ENCOUNTER — Ambulatory Visit (INDEPENDENT_AMBULATORY_CARE_PROVIDER_SITE_OTHER): Payer: Medicare Other | Admitting: Acute Care

## 2018-12-08 ENCOUNTER — Other Ambulatory Visit: Payer: Self-pay

## 2018-12-08 DIAGNOSIS — J449 Chronic obstructive pulmonary disease, unspecified: Secondary | ICD-10-CM

## 2018-12-08 MED ORDER — UMECLIDINIUM-VILANTEROL 62.5-25 MCG/INH IN AEPB: 1.0000 | INHALATION_SPRAY | Freq: Every day | RESPIRATORY_TRACT | 3 refills | Status: DC

## 2018-12-08 NOTE — Patient Instructions (Addendum)
It's great to talk with you today. Congratulations on remaining smoke free.  Keep it up!! I'm glad you feel better. Continue Zyrtec, Singulair and Benadryl for allergies Consider adding Flonase nasal Spray  1-2 sprays up each nostril once daily Continue Anoro  one puff once daily  Rinse mouth after use. Follow up appointment with Dr. Annamaria Boots in 3 months   Wednesday August 19 th at 10:30 am Follow up with Dr. Servando Snare as is scheduled Follow up CT 6 months after surgery per Dr. Servando Snare Continue walking as you have been doing. Make sure you use a mask when you are outside. Continue to self isolate, wash hand frequently, and wear a face mask if you leave your home. Remember to utilize the early hours at grocery stores/ drug stores for people >53 years old or any underlying health risks, as the stores are cleaner and less crowded at these times. Please contact office for sooner follow up if symptoms do not improve or worsen or seek emergency care

## 2018-12-08 NOTE — Addendum Note (Signed)
Addended by: Joella Prince on: 12/08/2018 09:42 AM   Modules accepted: Orders
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
## 2018-12-13 ENCOUNTER — Telehealth: Payer: Medicare Other | Admitting: Cardiovascular Disease

## 2018-12-14 ENCOUNTER — Other Ambulatory Visit: Payer: Self-pay

## 2018-12-14 MED ORDER — BUPROPION HCL ER (XL) 150 MG PO TB24: ORAL_TABLET | ORAL | 1 refills | Status: DC

## 2018-12-14 NOTE — Telephone Encounter (Signed)
MEDICATION REFILL

## 2018-12-16 ENCOUNTER — Encounter: Payer: Medicare Other | Admitting: Cardiothoracic Surgery

## 2018-12-17 ENCOUNTER — Other Ambulatory Visit: Payer: Self-pay | Admitting: Physician Assistant

## 2018-12-17 DIAGNOSIS — R059 Cough, unspecified: Secondary | ICD-10-CM

## 2018-12-17 DIAGNOSIS — R05 Cough: Secondary | ICD-10-CM

## 2018-12-29 ENCOUNTER — Other Ambulatory Visit: Payer: Self-pay | Admitting: Cardiothoracic Surgery

## 2018-12-29 ENCOUNTER — Other Ambulatory Visit: Payer: Self-pay

## 2018-12-29 DIAGNOSIS — C3490 Malignant neoplasm of unspecified part of unspecified bronchus or lung: Secondary | ICD-10-CM

## 2018-12-30 ENCOUNTER — Ambulatory Visit (INDEPENDENT_AMBULATORY_CARE_PROVIDER_SITE_OTHER): Payer: Medicare Other | Admitting: Cardiothoracic Surgery

## 2018-12-30 ENCOUNTER — Ambulatory Visit
Admission: RE | Admit: 2018-12-30 | Discharge: 2018-12-30 | Disposition: A | Discharge status: Home / Self Care | Payer: Medicare Other | Source: Ambulatory Visit | Attending: Cardiothoracic Surgery | Admitting: Cardiothoracic Surgery

## 2018-12-30 VITALS — BP 95/59 | HR 72 | Temp 97.7°F | Resp 20 | Ht 63.0 in | Wt 188.0 lb

## 2018-12-30 DIAGNOSIS — C3490 Malignant neoplasm of unspecified part of unspecified bronchus or lung: Secondary | ICD-10-CM

## 2018-12-30 DIAGNOSIS — Z902 Acquired absence of lung [part of]: Secondary | ICD-10-CM

## 2018-12-30 DIAGNOSIS — C3412 Malignant neoplasm of upper lobe, left bronchus or lung: Secondary | ICD-10-CM

## 2018-12-30 DIAGNOSIS — I517 Cardiomegaly: Secondary | ICD-10-CM | POA: Diagnosis not present

## 2018-12-30 DIAGNOSIS — J929 Pleural plaque without asbestos: Secondary | ICD-10-CM | POA: Diagnosis not present

## 2018-12-30 NOTE — Progress Notes (Signed)
[address]       Karen Barrera,[place] [address]             [phone]      Karen Barrera [place] Medical Record #[id number] Date of Birth: 09-29-1950  Referring: Deneise Lever, MD Primary Care: [name], MD Primary Cardiologist: Shelva Majestic, MD   Chief Complaint:   POST OP FOLLOW UP 09/28/2018 PREOPERATIVE DIAGNOSIS:  Left upper lobe lung mass. POSTOPERATIVE DIAGNOSIS:  Left upper lobe lung mass, nonsmall-cell lung cancer by frozen section. PROCEDURE PERFORMED:  Bronchoscopy, left video-assisted thoracoscopy, wedge resection, left upper lobe, completion left upper lobectomy with lymph node dissection and intercostal nerve blocks. SURGEON:  Lanelle Bal, MD  History of Present Illness:     Patient returns to the office after recent left upper lobectomy for stage Ia 2 non-small cell carcinoma of the lung. Since surgery patient has increased her ambulation to up to 2 miles a day without difficulty.  She denies shortness of breath     Cancer Staging Lung cancer [facility]) Staging form: Lung, AJCC 8th Edition - Pathologic stage from 09/29/2018: Stage IA2 (pT1b, pN0, cM0) - Signed by Grace Isaac, MD on 09/29/2018    Past Medical History:  Diagnosis Date  . Acute perforated appendicitis 10/31/2014  . Adenomatous colon polyp   . Allergic rhinitis   . Allergy   . Anemia   . Arthritis    "fingers" (10/31/2014)  . Chronic bronchitis ([place])    "got it q yr til I had my nose OR" (10/31/2014)  . COPD (chronic obstructive pulmonary disease) ([place])   . Coronary artery disease   . DDD (degenerative disc disease), cervical   . DDD (degenerative disc disease), lumbar   . Depression    "short term when my sister passed away unexpectedly"  . Family history of adverse reaction to anesthesia    "mother is hard to wake up; a little goes a long way w/her"  . Family history of breast cancer   . Family history of colon cancer   . Family history of skin  cancer   . GERD (gastroesophageal reflux disease)   . History of stomach ulcers   . Hyperlipidemia    "borderline; RX is preventative" (10/31/2014)  . Kidney stones    "they passed"  . Lung cancer ([place]) 09/28/2018  . Migraine    "used to get them alot; don't get them anymore" (10/31/2014)  . Osteoarthritis of both knees   . Osteopenia   . Pneumonia 2003 X 1  . Pre-diabetes   . Seizures ([place]) ~ 1962 X 1   "from an abscessed wisdom tooth"  . Tobacco dependence   . Vitamin D deficiency      Social History   Tobacco Use  Smoking Status Former Smoker  . Packs/day: 1.00  . Years: 50.00  . Pack years: 50.00  . Types: Cigarettes  . Last attempt to quit: 09/27/2018  . Years since quitting: 0.2  Smokeless Tobacco Never Used  Tobacco Comment   Quit 09/27/2018    Social History   Substance and Sexual Activity  Alcohol Use No  . Alcohol/week: 0.0 standard drinks     Allergies  Allergen Reactions  . Chantix [Varenicline] Anaphylaxis and Swelling    Makes throat swell up  . Moxifloxacin Hcl In Nacl Other (See Comments)    aches    Current Outpatient Medications  Medication Sig Dispense Refill  . albuterol (PROVENTIL HFA;VENTOLIN HFA) 108 (  90 Base) MCG/ACT inhaler Inhale 2 puffs into the lungs every 6 (six) hours as needed for wheezing or shortness of breath.    Marland Kitchen albuterol (PROVENTIL) (2.5 MG/3ML) 0.083% nebulizer solution Take 2.5 mg by nebulization every 6 (six) hours as needed for wheezing or shortness of breath.    Marland Kitchen alum & mag hydroxide-simeth (MAALOX/MYLANTA) 200-200-20 MG/5ML suspension Take 15-30 mLs by mouth every 6 (six) hours as needed for indigestion or heartburn.    Marland Kitchen aspirin EC 81 MG tablet Take 81 mg by mouth daily.    Marland Kitchen buPROPion (WELLBUTRIN XL) 150 MG 24 hr tablet Take 1 tablet every morning for Mood 90 tablet 1  . cetirizine (ZYRTEC) 10 MG tablet Take 10 mg by mouth daily.    . Cholecalciferol (VITAMIN D-3) 125 MCG (5000 UT) TABS Take 10,000 Units by mouth daily.  Please discuss dosage with medical doctor as is a high dose 30 tablet 0  . Coenzyme Q10 (COQ10) 200 MG CAPS Take 200 mg by mouth daily.    . diclofenac sodium (VOLTAREN) 1 % GEL Apply 2 g topically 4 (four) times daily as needed (joint pain).     Marland Kitchen diphenhydrAMINE (BENADRYL) 25 MG tablet Take 25 mg by mouth daily.    Marland Kitchen estradiol (ESTRACE) 1 MG tablet Take 1 mg by mouth daily.  3  . ibuprofen (ADVIL,MOTRIN) 200 MG tablet Take 600 mg by mouth every 8 (eight) hours as needed for headache or moderate pain.     . metoprolol succinate (TOPROL XL) 25 MG 24 hr tablet Take 1 tablet (25 mg total) by mouth daily.    . montelukast (SINGULAIR) 10 MG tablet TAKE 1 TABLET AT BEDTIME 90 tablet 1  . omeprazole (PRILOSEC) 40 MG capsule TAKE 1 CAPSULE BY MOUTH EVERY DAY 90 capsule 1  . oxyCODONE (OXY IR/ROXICODONE) 5 MG immediate release tablet Take 1 tablet (5 mg total) by mouth every 4 (four) hours as needed for severe pain. (Patient not taking: Reported on 12/08/2018) 30 tablet 0  . rosuvastatin (CRESTOR) 40 MG tablet Take 1 tablet (40 mg total) by mouth daily. 90 tablet 1  . sertraline (ZOLOFT) 100 MG tablet Take 1 tablet daily for Mood 90 tablet 3  . umeclidinium-vilanterol (ANORO ELLIPTA) 62.5-25 MCG/INH AEPB Inhale 1 puff into the lungs daily. 3 each 3   No current facility-administered medications for this visit.        Physical Exam: BP (!) 95/59   Pulse 72   Temp 97.7 F (36.5 C) (Skin)   Resp 20   Ht 5\' 3"  (1.6 m)   Wt 188 lb (85.3 kg)   SpO2 92% Comment: RA  BMI 33.30 kg/m  General appearance: alert and cooperative Head: Normocephalic, without obvious abnormality, atraumatic Neck: no adenopathy, no carotid bruit, no JVD, supple, symmetrical, trachea midline and thyroid not enlarged, symmetric, no tenderness/mass/nodules Lymph nodes: Cervical, supraclavicular, and axillary nodes normal. Resp: clear to auscultation bilaterally Back: symmetric, no curvature. ROM normal. No CVA tenderness.  Cardio: regular rate and rhythm, S1, S2 normal, no murmur, click, rub or gallop GI: soft, non-tender; bowel sounds normal; no masses,  no organomegaly Extremities: extremities normal, atraumatic, no cyanosis or edema and Homans sign is negative, no sign of DVT Neurologic: Grossly normal Patient's port sites and left chest are well-healed   Diagnostic Studies & Laboratory data:     Recent Radiology Findings:   Dg Chest 2 View  Result Date: 12/30/2018 CLINICAL DATA:  VATS. EXAM: CHEST - 2 VIEW COMPARISON:  10/21/2018. FINDINGS: Mediastinum and hilar structures normal. Surgical clips over the left chest. Left base pleural-parenchymal thickening is again noted and is unchanged. No pneumothorax. IMPRESSION: 1. Postsurgical changes left chest. Left base pleural-parenchymal thickening again noted and is unchanged. No new infiltrates. 2.  Stable cardiomegaly. Electronically Signed   By: Marcello Moores  Register   On: 12/30/2018 15:43    I have independently reviewed the above radiology studies  and reviewed the findings with the patient.    Recent Lab Findings: Lab Results  Component Value Date   WBC 7.0 11/22/2018   HGB 13.8 11/22/2018   HCT 41.0 11/22/2018   PLT 223 11/22/2018   GLUCOSE 92 11/22/2018   CHOL 182 11/22/2018   TRIG 120 11/22/2018   HDL 76 11/22/2018   LDLCALC 84 11/22/2018   ALT 21 11/22/2018   AST 19 11/22/2018   NA 137 11/22/2018   K 4.5 11/22/2018   CL 104 11/22/2018   CREATININE 0.93 11/22/2018   BUN 18 11/22/2018   CO2 26 11/22/2018   TSH 2.68 11/22/2018   INR 1.1 09/24/2018   HGBA1C 6.0 (H) 11/22/2018      Assessment / Plan:   Patient stable now 3 months after left upper lobectomy for stage I A2 non-small cell lung cancer.  Follow-up chest x-ray today shows clear lung fields bilaterally.  We will plan follow-up CT scan of the chest in 3 months which will be 6 months postop.   Grace Isaac MD      [address] [place] [address] Office  [phone]   Beeper [phone]  12/30/2018 4:32 PM

## 2019-01-20 ENCOUNTER — Telehealth: Payer: Self-pay | Admitting: Cardiovascular Disease

## 2019-01-20 NOTE — Telephone Encounter (Signed)
Spoke to pt husband and informed that pt appointment with Dr. Claiborne Billings on Monday, 6/29 has been changed to an in office visit. Informed of time of appointment and that pt needs to wear a mask. Informed that if she does not have one, we will provide one for her before she enters the office. Informed also that we are not allowing visitors at this time unless it is necessary for the pt. Pt husband verbalized understanding and thanks for the call. He stated he will inform pt.

## 2019-01-24 ENCOUNTER — Ambulatory Visit (INDEPENDENT_AMBULATORY_CARE_PROVIDER_SITE_OTHER): Payer: Medicare Other | Admitting: Cardiovascular Disease

## 2019-01-24 ENCOUNTER — Other Ambulatory Visit: Payer: Self-pay

## 2019-01-24 VITALS — BP 112/62 | HR 68 | Temp 98.1°F | Ht 63.0 in | Wt 190.4 lb

## 2019-01-24 DIAGNOSIS — I700 Atherosclerosis of aorta: Secondary | ICD-10-CM | POA: Diagnosis not present

## 2019-01-24 DIAGNOSIS — C3492 Malignant neoplasm of unspecified part of left bronchus or lung: Secondary | ICD-10-CM | POA: Diagnosis not present

## 2019-01-24 DIAGNOSIS — E785 Hyperlipidemia, unspecified: Secondary | ICD-10-CM

## 2019-01-24 DIAGNOSIS — J449 Chronic obstructive pulmonary disease, unspecified: Secondary | ICD-10-CM

## 2019-01-24 DIAGNOSIS — I2510 Atherosclerotic heart disease of native coronary artery without angina pectoris: Secondary | ICD-10-CM

## 2019-01-24 DIAGNOSIS — Z720 Tobacco use: Secondary | ICD-10-CM | POA: Diagnosis not present

## 2019-01-24 NOTE — Patient Instructions (Signed)
Medication Instructions:  Dr Claiborne Billings recommends that you continue on your current medications as directed. Please refer to the Current Medication list given to you today.  If you need a refill on your cardiac medications before your next appointment, please call your pharmacy.   Follow-Up: At [facility], you and your health needs are our priority.  As part of our continuing mission to provide you with exceptional heart care, we have created designated Provider Care Teams.  These Care Teams include your primary Cardiologist (physician) and Advanced Practice Providers (APPs -  Physician Assistants and Nurse Practitioners) who all work together to provide you with the care you need, when you need it. You will need a follow up appointment in 6 months.  Please call our office 2 months in advance to schedule this appointment.  You may see Shelva Majestic, MD or one of the following Advanced Practice Providers on your designated Care Team: [place], [place] . Fabian Sharp, PA-C . You will receive a reminder letter in the mail two months in advance. If you don't receive a letter, please call our office to schedule the follow-up appointment.

## 2019-01-24 NOTE — Progress Notes (Addendum)
Cardiology Office Note    Date:  01/24/2019   ID:  Karen Barrera, DOB 06/24/1951, MRN [id number]  Pulmonary: Dr. Keturah Barre PCP:  [name], MD  Cardiologist:  Shelva Majestic, MD   F/u cardiology evaluation initially referred through the courtesy of Dr. Keturah Barre for evaluation of coronary calcification.  History of Present Illness:  Karen Barrera is a 68 y.o. female who is the daughter of my patient, Karen Barrera.  She is followed by Dr. Melford Aase for primary care and Dr. Annamaria Boots for pulmonary.  She was initially referred for cardiology evaluation after being found to have coronary calcification.  She presents now for follow-up evaluation of her cardiac catheterization.  Karen Barrera has a long history of tobacco use.  She started smoking at age 36 and typically has smoked 1 pack/day.  She has smoked for 57 years and stopped during her pregnancies.  She is followed by Dr. Annamaria Boots for allergic rhinitis, recurrent acute bronchitis, as well as lung nodules.  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  She had recently undergone a chest CT which showed a stable left upper lobe nodule adjacent to bulla formation.  It was recommended that she undergo unenhanced chest CT in 12 months.  On her CT she was found to have coronary artery calcifications as well as a stable right hepatic cyst.  She admits to occasional episodes of left chest wall like discomfort.  Occasionally she has noticed some sporadic chest tightness but typically this has not been exertional.  Usually is short-lived and last only 2 to 3 minutes.  In addition to her longstanding smoking history, there is family history for cardiovascular disease.  Remotely, she believes she may have had a stress test in a years ago.  She has a history of a cracked sternum approximately 3 years ago.    When I initially saw her in December 2019 she denied any exertional type of chest pain.  However, with her longstanding tobacco history,  and family history for CAD as well as documented coronary calcification I recommended a CT coronary angiogram as well as a 2D echo Doppler study.  We discussed the importance of smoking cessation.  In addition with her previous coronary calcification LDL cholesterol of 93 I recommended titration of rosuvastatin to 20 mg.  Her echo Doppler study revealed an EF of 55 to 60% with grade 1 diastolic dysfunction.  There is very mild dilation of the aortic root.  There were no valvular abnormalities.  Her CTA was abnormal and showed coronary artery calcium score of 891 placing her in the 97th percentile for age and gender.  She was felt to have possible 70 to 90% stenosis in the proximal LAD and mild stenosis in the proximal circumflex.  FFR analysis was abnormal in the mid LAD at 0.76 suggesting hemodynamically significant proximal LAD stenosis.  Chest CT also revealed an interval increase in soft tissue thickening along the posterior and lateral aspect of the left upper lobe bulla and although this could represent infectious etiology an underlying neoplasm was suspected.  As result she is to undergo subsequent PET imaging.  She again was found to have aortic atherosclerosis, coronary atherosclerosis as well as emphysema.  She was also noted to have hepatosteatosis and probable enlarging posterior right hepatic lobe cyst.   When I last saw her on August 26, 2018 I recommended further titration of rosuvastatin to 40 mg and added Toprol-XL 25 mg to her medical regimen.  She underwent definitive cardiac catheterization on September 06, 2018 by me which showed moderate coronary calcification without high-grade obstructive disease.  There was a 20 to 30% proximal to mid LAD stenosis with 55% mid stenosis in the mid vessel beyond the small diagonal vessel, more significant calcification involving the proximal left circumflex coronary artery with 20% proximal narrowing and 20% narrowing in the OM1 vessel, and 30 and 20%  proximal to mid RCA stenoses.  LVEDP was 18 mm.  She had normal LV function without focal segmental wall motion abnormalities.  Medical therapy was recommended.  Smoking cessation was essential.  Since her catheterization, she was felt to have progression of a lung nodule ultimately led to bronchoscopy with left video-assisted thoracoscopy, wedge resection, and left upper lobectomy with lymph node dissection by Dr. Darius Bump.  Postoperative diagnosis was non-small cell lung cancer by frozen section stage I A2 (pT1b, pN 0, cMO).  She did not require any adjunctive chemotherapy.  Presently she feels well and has been without chest pain.  She denies palpitations.  She has tolerated her increased rosuvastatin dose and presents for evaluation.  Past Medical History:  Diagnosis Date   Acute perforated appendicitis 10/31/2014   Adenomatous colon polyp    Allergic rhinitis    Allergy    Anemia    Arthritis    "fingers" (10/31/2014)   Chronic bronchitis ([place])    "got it q yr til I had my nose OR" (10/31/2014)   COPD (chronic obstructive pulmonary disease) ([place])    Coronary artery disease    DDD (degenerative disc disease), cervical    DDD (degenerative disc disease), lumbar    Depression    "short term when my sister passed away unexpectedly"   Family history of adverse reaction to anesthesia    "mother is hard to wake up; a little goes a long way w/her"   Family history of breast cancer    Family history of colon cancer    Family history of skin cancer    GERD (gastroesophageal reflux disease)    History of stomach ulcers    Hyperlipidemia    "borderline; RX is preventative" (10/31/2014)   Kidney stones    "they passed"   Lung cancer ([place]) 09/28/2018   Migraine    "used to get them alot; don't get them anymore" (10/31/2014)   Osteoarthritis of both knees    Osteopenia    Pneumonia 2003 X 1   Pre-diabetes    Seizures ([place]) ~ 1962 X 1   "from an abscessed wisdom  tooth"   Tobacco dependence    Vitamin D deficiency     Past Surgical History:  Procedure Laterality Date   APPENDECTOMY     ARTHROTOMY Right 01/11/2016   Procedure: DISTAL INTERPHALANGEAL JOINT ARTHROTOMY RIGHT INDEX FINGER;  Surgeon: Leanora Cover, MD;  Location: [facility];  Service: Orthopedics;  Laterality: Right;   COLONOSCOPY     CYST EXCISION Right 01/11/2016   Procedure: EXCISION MUCOID CYST;  Surgeon: Leanora Cover, MD;  Location: [facility];  Service: Orthopedics;  Laterality: Right;   LAPAROSCOPIC APPENDECTOMY N/A 10/31/2014   Procedure: APPENDECTOMY LAPAROSCOPIC;  Surgeon: Alphonsa Overall, MD;  Location: [facility];  Service: General;  Laterality: N/A;   LEFT HEART CATH AND CORONARY ANGIOGRAPHY N/A 09/06/2018   Procedure: LEFT HEART CATH AND CORONARY ANGIOGRAPHY;  Surgeon: Troy Sine, MD;  Location: [facility];  Service: Cardiovascular;  Laterality: N/A;   SALPINGOOPHORECTOMY Right 1999   "  w/grapefruit-sized polyp"   SHOULDER ARTHROSCOPY Right 2003   removal spurs   SHOULDER ARTHROSCOPY W/ ROTATOR CUFF REPAIR Left 2012   SINUS EXPLORATION  12/2013   [facility]   TENDON REPAIR Left 10/2009   torn tendon @ elbow   VAGINAL HYSTERECTOMY  1978   VIDEO ASSISTED THORACOSCOPY (VATS)/WEDGE RESECTION Left 09/28/2018   Procedure: LEFT VIDEO ASSISTED THORACOSCOPY (VATS)/Completion Left Upper lobe Lobectomy. Lymph node dissection. Intercostal nerve block.;  Surgeon: Grace Isaac, MD;  Location: [facility];  Service: Thoracic;  Laterality: Left;   VIDEO BRONCHOSCOPY N/A 09/28/2018   Procedure: VIDEO BRONCHOSCOPY;  Surgeon: Grace Isaac, MD;  Location: [facility];  Service: Thoracic;  Laterality: N/A;    Current Medications: Outpatient Medications Prior to Visit  Medication Sig Dispense Refill   albuterol (PROVENTIL HFA;VENTOLIN HFA) 108 (90 Base) MCG/ACT inhaler Inhale 2 puffs into the lungs every 6 (six) hours as needed for wheezing or  shortness of breath.     albuterol (PROVENTIL) (2.5 MG/3ML) 0.083% nebulizer solution Take 2.5 mg by nebulization every 6 (six) hours as needed for wheezing or shortness of breath.     alum & mag hydroxide-simeth (MAALOX/MYLANTA) 200-200-20 MG/5ML suspension Take 15-30 mLs by mouth every 6 (six) hours as needed for indigestion or heartburn.     aspirin EC 81 MG tablet Take 81 mg by mouth daily.     buPROPion (WELLBUTRIN XL) 150 MG 24 hr tablet Take 1 tablet every morning for Mood 90 tablet 1   cetirizine (ZYRTEC) 10 MG tablet Take 10 mg by mouth daily.     Cholecalciferol (VITAMIN D-3) 125 MCG (5000 UT) TABS Take 10,000 Units by mouth daily. Please discuss dosage with medical doctor as is a high dose 30 tablet 0   Coenzyme Q10 (COQ10) 200 MG CAPS Take 200 mg by mouth daily.     diphenhydrAMINE (BENADRYL) 25 MG tablet Take 25 mg by mouth daily.     estradiol (ESTRACE) 1 MG tablet Take 1 mg by mouth daily.  3   ibuprofen (ADVIL,MOTRIN) 200 MG tablet Take 600 mg by mouth every 8 (eight) hours as needed for headache or moderate pain.      metoprolol succinate (TOPROL XL) 25 MG 24 hr tablet Take 1 tablet (25 mg total) by mouth daily.     montelukast (SINGULAIR) 10 MG tablet TAKE 1 TABLET AT BEDTIME 90 tablet 1   omeprazole (PRILOSEC) 40 MG capsule TAKE 1 CAPSULE BY MOUTH EVERY DAY 90 capsule 1   rosuvastatin (CRESTOR) 40 MG tablet Take 1 tablet (40 mg total) by mouth daily. 90 tablet 1   sertraline (ZOLOFT) 100 MG tablet Take 1 tablet daily for Mood 90 tablet 3   umeclidinium-vilanterol (ANORO ELLIPTA) 62.5-25 MCG/INH AEPB Inhale 1 puff into the lungs daily. 3 each 3   diclofenac sodium (VOLTAREN) 1 % GEL Apply 2 g topically 4 (four) times daily as needed (joint pain).      oxyCODONE (OXY IR/ROXICODONE) 5 MG immediate release tablet Take 1 tablet (5 mg total) by mouth every 4 (four) hours as needed for severe pain. (Patient not taking: Reported on 12/08/2018) 30 tablet 0   No  facility-administered medications prior to visit.      Allergies:   Chantix [varenicline] and Moxifloxacin hcl in nacl   Social History   Socioeconomic History   Marital status: Married    Spouse name: Not on file   Number of children: Not on file   Years of education: Not on file  Highest education level: Not on file  Occupational History   Occupation: Retired  Scientist, product/process development strain: Not on file   Food insecurity    Worry: Not on file    Inability: Not on Lexicographer needs    Medical: Not on file    Non-medical: Not on file  Tobacco Use   Smoking status: Former Smoker    Packs/day: 1.00    Years: 50.00    Pack years: 50.00    Types: Cigarettes    Quit date: 09/27/2018    Years since quitting: 0.3   Smokeless tobacco: Never Used   Tobacco comment: Quit 09/27/2018  Substance and Sexual Activity   Alcohol use: No    Alcohol/week: 0.0 standard drinks   Drug use: No   Sexual activity: Not Currently  Lifestyle   Physical activity    Days per week: Not on file    Minutes per session: Not on file   Stress: Not on file  Relationships   Social connections    Talks on phone: Not on file    Gets together: Not on file    Attends religious service: Not on file    Active member of club or organization: Not on file    Attends meetings of clubs or organizations: Not on file    Relationship status: Not on file  Other Topics Concern   Not on file  Social History Narrative   Not on file    Social history is notable in that she is married for 50 years.  She has 2 children ages 77 and 54.  One child was stillborn at 24 months.  She is retired.  She has a BS in business and marketing.  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  She does not routinely exercise.  Family History:  The patient's family history includes Arthritis in her paternal grandmother; Breast cancer in an other family member; Breast  cancer (age of onset: 89) in an other family member; Cancer in an other family member; Colon cancer in an other family member; Colon cancer (age of onset: 87) in some other family members; Colon polyps in her father; Diabetes in her mother; Hyperlipidemia in her maternal grandfather; Melanoma (age of onset: 2) in her paternal grandmother; Seizures in her sister; Skin cancer (age of onset: 53) in her mother; Stroke in her maternal grandmother.   ROS General: Negative; No fevers, chills, or night sweats;  HEENT: Negative; No changes in vision or hearing, sinus congestion, difficulty swallowing Pulmonary: COPD, lung nodule Cardiovascular: See HPI GI: Negative; No nausea, vomiting, diarrhea, or abdominal pain GU: Negative; No dysuria, hematuria, or difficulty voiding Musculoskeletal: Negative; no myalgias, joint pain, or weakness Hematologic/Oncology: Negative; no easy bruising, bleeding Endocrine: Negative; no heat/cold intolerance; no diabetes Neuro: Negative; no changes in balance, headaches Skin: Negative; No rashes or skin lesions Psychiatric: Negative; No behavioral problems, depression Sleep: Negative; No snoring, daytime sleepiness, hypersomnolence, bruxism, restless legs, hypnogognic hallucinations, no cataplexy Other comprehensive 14 point system review is negative.   PHYSICAL EXAM:   VS:  BP 112/62    Pulse 68    Temp 98.1 F (36.7 C) (Temporal)    Ht '5\' 3"'  (1.6 m)    Wt 190 lb 6.4 oz (86.4 kg)    SpO2 98%    BMI 33.73 kg/m    Repeat blood pressure by me 112/64  Wt Readings from Last 3 Encounters:  01/24/19 190 lb  6.4 oz (86.4 kg)  12/30/18 188 lb (85.3 kg)  11/22/18 184 lb (83.5 kg)    General: Alert, oriented, no distress.  Skin: normal turgor, no rashes, warm and dry HEENT: Normocephalic, atraumatic. Pupils equal round and reactive to light; sclera anicteric; extraocular muscles intact;  Nose without nasal septal hypertrophy Mouth/Parynx benign;  Neck: No JVD, no  carotid bruits; normal carotid upstroke Lungs: clear to ausculatation and percussion; no wheezing or rales Chest wall: without tenderness to palpitation Heart: PMI not displaced, RRR, s1 s2 normal, 1/6 systolic murmur, no diastolic murmur, no rubs, gallops, thrills, or heaves Abdomen: soft, nontender; no hepatosplenomehaly, BS+; abdominal aorta nontender and not dilated by palpation. Back: no CVA tenderness Pulses 2+ Musculoskeletal: full range of motion, normal strength, no joint deformities Extremities: no clubbing cyanosis or edema, Homan's sign negative  Neurologic: grossly nonfocal; Cranial nerves grossly wnl Psychologic: Normal mood and affect   Studies/Labs Reviewed:   EKG:  EKG is ordered today.  ECG (independently read by me):NSR at 68, Nonspecic T wave abnormality V2-3  August 26, 2018 ECG (independently read by me): Normal sinus rhythm at 75 bpm.  Possible left atrial enlargement.  QTc interval 462 ms.  PR interval 176 ms.  July 15, 2018 ECG (independently read by me): Sinus rhythm at 78 bpm.  No ectopy.  Normal intervals.  Early transition.  Recent Labs: BMP Latest Ref Rng & Units 11/22/2018 10/12/2018 09/30/2018  Glucose 65 - 99 mg/dL 92 79 109(H)  BUN 7 - 25 mg/dL '18 16 12  ' Creatinine 0.50 - 0.99 mg/dL 0.93 0.86 0.88  BUN/Creat Ratio 6 - 22 (calc) NOT APPLICABLE NOT APPLICABLE -  Sodium 962 - 146 mmol/L 137 138 136  Potassium 3.5 - 5.3 mmol/L 4.5 4.5 4.1  Chloride 98 - 110 mmol/L 104 104 104  CO2 20 - 32 mmol/L '26 26 24  ' Calcium 8.6 - 10.4 mg/dL 9.7 9.0 8.2(L)     Hepatic Function Latest Ref Rng & Units 11/22/2018 10/12/2018 09/30/2018  Total Protein 6.1 - 8.1 g/dL 6.9 6.0(L) 5.6(L)  Albumin 3.5 - 5.0 g/dL - - 3.0(L)  AST 10 - 35 U/L '19 15 24  ' ALT 6 - 29 U/L '21 22 19  ' Alk Phosphatase 38 - 126 U/L - - 46  Total Bilirubin 0.2 - 1.2 mg/dL 0.3 0.2 0.4  Bilirubin, Direct 0.0 - 0.2 mg/dL - - -    CBC Latest Ref Rng & Units 11/22/2018 10/12/2018 09/30/2018  WBC 3.8 -  10.8 Thousand/uL 7.0 11.1(H) 8.9  Hemoglobin 11.7 - 15.5 g/dL 13.8 12.7 11.4(L)  Hematocrit 35.0 - 45.0 % 41.0 37.8 34.6(L)  Platelets 140 - 400 Thousand/uL 223 379 144(L)   Lab Results  Component Value Date   MCV 88.6 11/22/2018   MCV 89.6 10/12/2018   MCV 93.0 09/30/2018   Lab Results  Component Value Date   TSH 2.68 11/22/2018   Lab Results  Component Value Date   HGBA1C 6.0 (H) 11/22/2018     BNP No results found for: BNP  ProBNP No results found for: PROBNP   Lipid Panel     Component Value Date/Time   CHOL 182 11/22/2018 1121   TRIG 120 11/22/2018 1121   HDL 76 11/22/2018 1121   CHOLHDL 2.4 11/22/2018 1121   VLDL 29 11/12/2016 1730   LDLCALC 84 11/22/2018 1121     RADIOLOGY: Dg Chest 2 View  Result Date: 12/30/2018 CLINICAL DATA:  VATS. EXAM: CHEST - 2 VIEW COMPARISON:  10/21/2018. FINDINGS: Mediastinum  and hilar structures normal. Surgical clips over the left chest. Left base pleural-parenchymal thickening is again noted and is unchanged. No pneumothorax. IMPRESSION: 1. Postsurgical changes left chest. Left base pleural-parenchymal thickening again noted and is unchanged. No new infiltrates. 2.  Stable cardiomegaly. Electronically Signed   By: Marcello Moores  Register   On: 12/30/2018 15:43     Additional studies/ records that were reviewed today include:  Reviewed the records of Dr. Annamaria Boots, her spirometry, chest CT and laboratory.  ASSESSMENT:    1. Coronary artery disease involving native coronary artery of native heart without angina pectoris   2. Aortic atherosclerosis ([place])   3. Chronic obstructive pulmonary disease, unspecified COPD type ([place])   4. Hyperlipidemia LDL goal <70   5. Tobacco abuse   6. Non-small cell cancer of left lung [facility])     PLAN:  Karen Barrera is a 68 year old female who has a 47-year history of tobacco use, having started at age 68.  She has a history of surgical rhinitis, recurrent acute bronchitis, mixed COPD and has been on  Singulair, pro-air, and Anora Ellipta.  She has had intermittent episodes of nonradiating short-lived chest discomfort, not typically exertionally precipitated.  A chest CT from August 2019 revealed stable left upper lobe nodule but she was noted to have coronary artery calcification suggesting CAD.  When I initially saw her, I titrated her rosuvastatin to 20 mg in light of her LDL of 93.  Her echo Doppler study reveals normal systolic function with grade 1 diastolic dysfunction.  Due to her coronary calcification and significant tobacco history I have scheduled her for coronary CTA.  Calcium score was significantly increased at 91 placing her in the 97th percentile for age and gender.  She was found to have probable 70 to 90% severe stenosis in the proximal LAD and on on FFR assessment this was felt to be hemodynamically significant at 0.76.   I reviewed her cardiac catheterization findings which revealed evidence for coronary calcification and mild not highly obstructive CAD for which medical therapy was recommended.  She subsequently found to have progressive increase in her soft tissue thickening along the posterior lateral left upper lobe bulla and underwent ultimate bronchoscopy with left upper lobectomy and lymph node dissection for non-small cell CA cancer by frozen section.  She is felt to be clear and does not require chemotherapy.  Her blood pressure today is stable on her current regimen on Toprol-XL 25 mg.  I reviewed recent laboratory.  She is tolerating increased rosuvastatin to 20 mg with most recent LDL improved at 84.  Target LDL is less than 70.  If she is unable to achieve this goal the addition of Zetia 10 mg may be beneficial.  She is not having any anginal symptomatology.  She continues to be on Singulair, and Anora Ellipta, and as needed albuterol for her COPD.  She will return to the primary care of Dr. Melford Aase.  I will see her in 6 months for cardiology reevaluation.   Medication  Adjustments/Labs and Tests Ordered: Current medicines are reviewed at length with the patient today.  Concerns regarding medicines are outlined above.  Medication changes, Labs and Tests ordered today are listed in the Patient Instructions below. There are no Patient Instructions on file for this visit.   Signed, Shelva Majestic, MD  01/24/2019 12:20 PM    [facility] [address] [place], [place], [place]  [address] Phone: [phone]

## 2019-01-25 ENCOUNTER — Encounter: Payer: Self-pay | Admitting: Cardiovascular Disease

## 2019-02-20 ENCOUNTER — Other Ambulatory Visit: Payer: Self-pay | Admitting: Internal Medicine

## 2019-03-01 DIAGNOSIS — L218 Other seborrheic dermatitis: Secondary | ICD-10-CM | POA: Diagnosis not present

## 2019-03-01 DIAGNOSIS — L820 Inflamed seborrheic keratosis: Secondary | ICD-10-CM | POA: Diagnosis not present

## 2019-03-03 ENCOUNTER — Other Ambulatory Visit: Payer: Self-pay | Admitting: Cardiothoracic Surgery

## 2019-03-03 DIAGNOSIS — C3490 Malignant neoplasm of unspecified part of unspecified bronchus or lung: Secondary | ICD-10-CM

## 2019-03-06 ENCOUNTER — Encounter: Payer: Self-pay | Admitting: Internal Medicine

## 2019-03-06 NOTE — Progress Notes (Signed)
History of Present Illness:      This very nice 68 y.o.female presents for 3 month follow up with labile HTN, HLD, COPD,  Pre-Diabetes and Vitamin D Deficiency. In Mar 2020, she underwent VATS / LU Lobectomy for resection of non-small cell lung cancer. Patient is followed by Dr Annamaria Boots for COPD. Patient has GERD & reports her Omeprazole if not lasting a full 24 hours      Patient is followed expectantly for labile HTN & BP has been controlled at home. Today's BP is at goal - 121/81.   In Feb 2020, Cardiac cath by Dr Claiborne Billings found minimal nonobstructive CAD.  Patient has had no complaints of any cardiac type chest pain, palpitations, dyspnea / orthopnea / PND, dizziness, claudication, or dependent edema.      Hyperlipidemia is controlled with diet & meds. Patient denies myalgias or other med SE's. Last Lipids were at goal: Lab Results  Component Value Date   CHOL 182 11/22/2018   HDL 76 11/22/2018   LDLCALC 84 11/22/2018   TRIG 120 11/22/2018   CHOLHDL 2.4 11/22/2018       Also, the patient has history of PreDiabetes (A1c 6.2 / 2015, 6.2% / 2016) and has had no symptoms of reactive hypoglycemia, diabetic polys, paresthesias or visual blurring.  Last A1c was not at goal: Lab Results  Component Value Date   HGBA1C 6.0 (H) 11/22/2018       Further, the patient also has history of Vitamin D Deficiency  and supplements vitamin D without any suspected side-effects. Last vitamin D was near goal (70-100): Lab Results  Component Value Date   VD25OH 56 08/11/2018   Current Outpatient Medications on File Prior to Visit  Medication Sig  . albuterol (PROVENTIL HFA;VENTOLIN HFA) 108 (90 Base) MCG/ACT inhaler Inhale 2 puffs into the lungs every 6 (six) hours as needed for wheezing or shortness of breath.  Marland Kitchen albuterol (PROVENTIL) (2.5 MG/3ML) 0.083% nebulizer solution Take 2.5 mg by nebulization every 6 (six) hours as needed for wheezing or shortness of breath.  Marland Kitchen alum & mag hydroxide-simeth  (MAALOX/MYLANTA) 200-200-20 MG/5ML suspension Take 15-30 mLs by mouth every 6 (six) hours as needed for indigestion or heartburn.  Jearl Klinefelter ELLIPTA 62.5-25 MCG/INH AEPB USE 1 INHALATION ORALLY    DAILY  . aspirin EC 81 MG tablet Take 81 mg by mouth daily.  Marland Kitchen buPROPion (WELLBUTRIN XL) 150 MG 24 hr tablet Take 1 tablet every morning for Mood  . cetirizine (ZYRTEC) 10 MG tablet Take 10 mg by mouth daily.  . Cholecalciferol (VITAMIN D-3) 125 MCG (5000 UT) TABS Take 10,000 Units by mouth daily. Please discuss dosage with medical doctor as is a high dose  . Coenzyme Q10 (COQ10) 200 MG CAPS Take 200 mg by mouth daily.  . diphenhydrAMINE (BENADRYL) 25 MG tablet Take 25 mg by mouth daily.  Marland Kitchen estradiol (ESTRACE) 1 MG tablet Take 1 mg by mouth daily.  Marland Kitchen ibuprofen (ADVIL,MOTRIN) 200 MG tablet Take 600 mg by mouth every 8 (eight) hours as needed for headache or moderate pain.   . metoprolol succinate (TOPROL XL) 25 MG 24 hr tablet Take 1 tablet (25 mg total) by mouth daily.  . montelukast (SINGULAIR) 10 MG tablet TAKE 1 TABLET AT BEDTIME  . omeprazole (PRILOSEC) 40 MG capsule TAKE 1 CAPSULE BY MOUTH EVERY DAY  . rosuvastatin (CRESTOR) 40 MG tablet Take 1 tablet (40 mg total) by mouth daily.  . sertraline (ZOLOFT) 100 MG tablet  Take 1 tablet daily for Mood   No current facility-administered medications on file prior to visit.    Allergies  Allergen Reactions  . Chantix [Varenicline] Anaphylaxis and Swelling    Makes throat swell up  . Moxifloxacin Hcl In Nacl Other (See Comments)    aches   PMHx:   Past Medical History:  Diagnosis Date  . Acute perforated appendicitis 10/31/2014  . Adenomatous colon polyp   . Allergic rhinitis   . Allergy   . Anemia   . Arthritis    "fingers" (10/31/2014)  . Chronic bronchitis ([place])    "got it q yr til I had my nose OR" (10/31/2014)  . COPD (chronic obstructive pulmonary disease) ([place])   . Coronary artery disease   . DDD (degenerative disc disease), cervical   .  DDD (degenerative disc disease), lumbar   . Depression    "short term when my sister passed away unexpectedly"  . Family history of adverse reaction to anesthesia    "mother is hard to wake up; a little goes a long way w/her"  . Family history of breast cancer   . Family history of colon cancer   . Family history of skin cancer   . GERD (gastroesophageal reflux disease)   . History of stomach ulcers   . Hyperlipidemia    "borderline; RX is preventative" (10/31/2014)  . Kidney stones    "they passed"  . Lung cancer ([place]) 09/28/2018  . Migraine    "used to get them alot; don't get them anymore" (10/31/2014)  . Osteoarthritis of both knees   . Osteopenia   . Pneumonia 2003 X 1  . Pre-diabetes   . Seizures ([place]) ~ 1962 X 1   "from an abscessed wisdom tooth"  . Tobacco dependence   . Vitamin D deficiency    Immunization History  Administered Date(s) Administered  . Influenza Split 05/16/2013, 05/12/2014, 05/14/2015  . Influenza, High Dose Seasonal PF 05/15/2017, 05/07/2018  . Influenza, Seasonal, Injecte, Preservative Fre 05/13/2016  . Pneumococcal Conjugate-13 11/12/2016  . Pneumococcal Polysaccharide-23 06/14/2011  . Tdap 03/27/2010  . Zoster 04/14/2012   Past Surgical History:  Procedure Laterality Date  . APPENDECTOMY    . ARTHROTOMY Right 01/11/2016   Procedure: DISTAL INTERPHALANGEAL JOINT ARTHROTOMY RIGHT INDEX FINGER;  Surgeon: Leanora Cover, MD;  Location: [facility];  Service: Orthopedics;  Laterality: Right;  . COLONOSCOPY    . CYST EXCISION Right 01/11/2016   Procedure: EXCISION MUCOID CYST;  Surgeon: Leanora Cover, MD;  Location: [facility];  Service: Orthopedics;  Laterality: Right;  . LAPAROSCOPIC APPENDECTOMY N/A 10/31/2014   Procedure: APPENDECTOMY LAPAROSCOPIC;  Surgeon: Alphonsa Overall, MD;  Location: [facility];  Service: General;  Laterality: N/A;  . LEFT HEART CATH AND CORONARY ANGIOGRAPHY N/A 09/06/2018   Procedure: LEFT HEART CATH AND CORONARY  ANGIOGRAPHY;  Surgeon: Troy Sine, MD;  Location: [facility];  Service: Cardiovascular;  Laterality: N/A;  . SALPINGOOPHORECTOMY Right 1999   "w/grapefruit-sized polyp"  . SHOULDER ARTHROSCOPY Right 2003   removal spurs  . SHOULDER ARTHROSCOPY W/ ROTATOR CUFF REPAIR Left 2012  . SINUS EXPLORATION  12/2013   [facility]  . TENDON REPAIR Left 10/2009   torn tendon @ elbow  . VAGINAL HYSTERECTOMY  1978  . VIDEO ASSISTED THORACOSCOPY (VATS)/WEDGE RESECTION Left 09/28/2018   Procedure: LEFT VIDEO ASSISTED THORACOSCOPY (VATS)/Completion Left Upper lobe Lobectomy. Lymph node dissection. Intercostal nerve block.;  Surgeon: Grace Isaac, MD;  Location: [facility];  Service: Thoracic;  Laterality: Left;  Marland Kitchen VIDEO BRONCHOSCOPY N/A 09/28/2018   Procedure: VIDEO BRONCHOSCOPY;  Surgeon: Grace Isaac, MD;  Location[facility];  Service: Thoracic;  Laterality: N/A;   FHx:    Reviewed / unchanged  SHx:    Reviewed / unchanged   Systems Review:  Constitutional: Denies fever, chills, wt changes, headaches, insomnia, fatigue, night sweats, change in appetite. Eyes: Denies redness, blurred vision, diplopia, discharge, itchy, watery eyes.  ENT: Denies discharge, congestion, post nasal drip, epistaxis, sore throat, earache, hearing loss, dental pain, tinnitus, vertigo, sinus pain, snoring.  CV: Denies chest pain, palpitations, irregular heartbeat, syncope, dyspnea, diaphoresis, orthopnea, PND, claudication or edema. Respiratory: denies cough, dyspnea, DOE, pleurisy, hoarseness, laryngitis, wheezing.  Gastrointestinal: Denies dysphagia, odynophagia, heartburn, reflux, water brash, abdominal pain or cramps, nausea, vomiting, bloating, diarrhea, constipation, hematemesis, melena, hematochezia  or hemorrhoids. Genitourinary: Denies dysuria, frequency, urgency, nocturia, hesitancy, discharge, hematuria or flank pain. Musculoskeletal: Denies arthralgias, myalgias, stiffness, jt. swelling, pain, limping or  strain/sprain.  Skin: Denies pruritus, rash, hives, warts, acne, eczema or change in skin lesion(s). Neuro: No weakness, tremor, incoordination, spasms, paresthesia or pain. Psychiatric: Denies confusion, memory loss or sensory loss. Endo: Denies change in weight, skin or hair change.  Heme/Lymph: No excessive bleeding, bruising or enlarged lymph nodes.  Physical Exam  BP 121/81   Pulse 68   Temp (!) 97.1 F (36.2 C)   Resp 16   Ht 5\' 3"  (1.6 m)   Wt 193 lb 3.2 oz (87.6 kg)   BMI 34.22 kg/m   Appears  well nourished, well groomed  and in no distress.  Eyes: PERRLA, EOMs, conjunctiva no swelling or erythema. Sinuses: No frontal/maxillary tenderness ENT/Mouth: EAC's clear, TM's nl w/o erythema, bulging. Nares clear w/o erythema, swelling, exudates. Oropharynx clear without erythema or exudates. Oral hygiene is good. Tongue normal, non obstructing. Hearing intact.  Neck: Supple. Thyroid not palpable. Car 2+/2+ without bruits, nodes or JVD. Chest: Respirations nl with BS clear & equal w/o rales, rhonchi, wheezing or stridor.  Cor: Heart sounds normal w/ regular rate and rhythm without sig. murmurs, gallops, clicks or rubs. Peripheral pulses normal and equal  without edema.  Abdomen: Soft & bowel sounds normal. Non-tender w/o guarding, rebound, hernias, masses or organomegaly.  Lymphatics: Unremarkable.  Musculoskeletal: Full ROM all peripheral extremities, joint stability, 5/5 strength and normal gait.  Skin: Warm, dry without exposed rashes, lesions or ecchymosis apparent.  Neuro: Cranial nerves intact, reflexes equal bilaterally. Sensory-motor testing grossly intact. Tendon reflexes grossly intact.  Pysch: Alert & oriented x 3.  Insight and judgement nl & appropriate. No ideations.  Assessment and Plan:  1. Labile hypertension  - Continue medication, monitor blood pressure at home.  - Continue DASH diet.  Reminder to go to the ER if any CP,  SOB, nausea, dizziness, severe HA,  changes vision/speech.  - CBC with Differential/Platelet - COMPLETE METABOLIC PANEL WITH GFR - Magnesium - TSH  2. Hyperlipidemia, mixed  - Continue diet/meds, exercise,& lifestyle modifications.  - Continue monitor periodic cholesterol/liver & renal functions   - Lipid panel - TSH  3. Abnormal glucose  - Continue diet, exercise  - Lifestyle modifications.  - Monitor appropriate labs.  - Fructosamine - Insulin, random  4. Vitamin D deficiency  - Continue supplementation.  - VITAMIN D 25 Hydroxyl  5. Prediabetes  - Continue diet, exercise  - Lifestyle modifications.  - Monitor appropriate labs.  - Fructosamine - Insulin, random  6.  GERD   -  increase Omeprazole to bid  7. Medication management  - CBC with Differential/Platelet - COMPLETE METABOLIC PANEL WITH GFR - Magnesium - Lipid panel - TSH - Fructosamine - Insulin, random - VITAMIN D 25 Hydroxyl        Discussed  regular exercise, BP monitoring, weight control to achieve/maintain BMI less than 25 and discussed med and SE's. Recommended labs to assess and monitor clinical status with further disposition pending results of labs.  I discussed the assessment and treatment plan with the patient. The patient was provided an opportunity to ask questions and all were answered. The patient agreed with the plan and demonstrated an understanding of the instructions. I provided over 30 minutes of exam, counseling, chart review and  complex critical decision making.   Kirtland Bouchard, MD

## 2019-03-06 NOTE — Patient Instructions (Signed)

## 2019-03-07 ENCOUNTER — Ambulatory Visit (INDEPENDENT_AMBULATORY_CARE_PROVIDER_SITE_OTHER): Payer: Medicare Other | Admitting: Internal Medicine

## 2019-03-07 ENCOUNTER — Other Ambulatory Visit: Payer: Self-pay

## 2019-03-07 ENCOUNTER — Encounter: Payer: Self-pay | Admitting: Internal Medicine

## 2019-03-07 VITALS — BP 121/81 | HR 68 | Temp 97.1°F | Resp 16 | Ht 63.0 in | Wt 193.2 lb

## 2019-03-07 DIAGNOSIS — E782 Mixed hyperlipidemia: Secondary | ICD-10-CM

## 2019-03-07 DIAGNOSIS — R7303 Prediabetes: Secondary | ICD-10-CM

## 2019-03-07 DIAGNOSIS — R7309 Other abnormal glucose: Secondary | ICD-10-CM

## 2019-03-07 DIAGNOSIS — Z79899 Other long term (current) drug therapy: Secondary | ICD-10-CM

## 2019-03-07 DIAGNOSIS — R05 Cough: Secondary | ICD-10-CM | POA: Diagnosis not present

## 2019-03-07 DIAGNOSIS — R059 Cough, unspecified: Secondary | ICD-10-CM

## 2019-03-07 DIAGNOSIS — R0989 Other specified symptoms and signs involving the circulatory and respiratory systems: Secondary | ICD-10-CM

## 2019-03-07 DIAGNOSIS — E559 Vitamin D deficiency, unspecified: Secondary | ICD-10-CM

## 2019-03-07 MED ORDER — OMEPRAZOLE 40 MG PO CPDR: DELAYED_RELEASE_CAPSULE | ORAL | 3 refills | Status: DC

## 2019-03-08 ENCOUNTER — Other Ambulatory Visit: Payer: Self-pay | Admitting: Cardiovascular Disease

## 2019-03-10 LAB — COMPLETE METABOLIC PANEL WITH GFR
AG Ratio: 1.6 (calc) (ref 1.0–2.5)
ALT: 35 U/L — ABNORMAL HIGH (ref 6–29)
AST: 34 U/L (ref 10–35)
Albumin: 4.4 g/dL (ref 3.6–5.1)
Alkaline phosphatase (APISO): 56 U/L (ref 37–153)
BUN: 18 mg/dL (ref 7–25)
CO2: 21 mmol/L (ref 20–32)
Calcium: 9.3 mg/dL (ref 8.6–10.4)
Chloride: 104 mmol/L (ref 98–110)
Creat: 0.76 mg/dL (ref 0.50–0.99)
GFR, Est African American: 94 mL/min/{1.73_m2} (ref >=60)
GFR, Est Non African American: 81 mL/min/{1.73_m2} (ref >=60)
Globulin: 2.7 g/dL (calc) (ref 1.9–3.7)
Glucose, Bld: 79 mg/dL (ref 65–99)
Potassium: 4.5 mmol/L (ref 3.5–5.3)
Sodium: 137 mmol/L (ref 135–146)
Total Bilirubin: 0.4 mg/dL (ref 0.2–1.2)
Total Protein: 7.1 g/dL (ref 6.1–8.1)

## 2019-03-10 LAB — CBC WITH DIFFERENTIAL/PLATELET
Absolute Monocytes: 500 cells/uL (ref 200–950)
Basophils Absolute: 67 cells/uL (ref 0–200)
Basophils Relative: 1.1 %
Eosinophils Absolute: 110 cells/uL (ref 15–500)
Eosinophils Relative: 1.8 %
HCT: 42.9 % (ref 35.0–45.0)
Hemoglobin: 13.9 g/dL (ref 11.7–15.5)
Lymphs Abs: 1446 cells/uL (ref 850–3900)
MCH: 28.7 pg (ref 27.0–33.0)
MCHC: 32.4 g/dL (ref 32.0–36.0)
MCV: 88.6 fL (ref 80.0–100.0)
MPV: 9.9 fL (ref 7.5–12.5)
Monocytes Relative: 8.2 %
Neutro Abs: 3977 cells/uL (ref 1500–7800)
Neutrophils Relative %: 65.2 %
Platelets: 214 10*3/uL (ref 140–400)
RBC: 4.84 10*6/uL (ref 3.80–5.10)
RDW: 13.4 % (ref 11.0–15.0)
Total Lymphocyte: 23.7 %
WBC: 6.1 10*3/uL (ref 3.8–10.8)

## 2019-03-10 LAB — TSH: TSH: 2.28 mIU/L (ref 0.40–4.50)

## 2019-03-10 LAB — LIPID PANEL
Cholesterol: 171 mg/dL (ref <200)
HDL: 67 mg/dL (ref >=50)
LDL Cholesterol (Calc): 77 mg/dL (calc)
Non-HDL Cholesterol (Calc): 104 mg/dL (calc) (ref <130)
Total CHOL/HDL Ratio: 2.6 (calc) (ref <5.0)
Triglycerides: 172 mg/dL — ABNORMAL HIGH (ref <150)

## 2019-03-10 LAB — VITAMIN D 25 HYDROXY (VIT D DEFICIENCY, FRACTURES): Vit D, 25-Hydroxy: 63 ng/mL (ref 30–100)

## 2019-03-10 LAB — FRUCTOSAMINE: Fructosamine: 227 umol/L (ref 205–285)

## 2019-03-10 LAB — INSULIN, RANDOM: Insulin: 17.2 u[IU]/mL

## 2019-03-10 LAB — MAGNESIUM: Magnesium: 2.4 mg/dL (ref 1.5–2.5)

## 2019-03-16 ENCOUNTER — Encounter: Payer: Self-pay | Admitting: Internal Medicine

## 2019-03-16 ENCOUNTER — Ambulatory Visit (INDEPENDENT_AMBULATORY_CARE_PROVIDER_SITE_OTHER): Payer: Medicare Other | Admitting: Internal Medicine

## 2019-03-16 ENCOUNTER — Other Ambulatory Visit: Payer: Self-pay

## 2019-03-16 DIAGNOSIS — I2510 Atherosclerotic heart disease of native coronary artery without angina pectoris: Secondary | ICD-10-CM | POA: Diagnosis not present

## 2019-03-16 DIAGNOSIS — R931 Abnormal findings on diagnostic imaging of heart and coronary circulation: Secondary | ICD-10-CM | POA: Diagnosis not present

## 2019-03-16 DIAGNOSIS — F17200 Nicotine dependence, unspecified, uncomplicated: Secondary | ICD-10-CM | POA: Diagnosis not present

## 2019-03-16 DIAGNOSIS — J449 Chronic obstructive pulmonary disease, unspecified: Secondary | ICD-10-CM

## 2019-03-16 MED ORDER — ALBUTEROL SULFATE HFA 108 (90 BASE) MCG/ACT IN AERS: 2.0000 | INHALATION_SPRAY | Freq: Four times a day (QID) | RESPIRATORY_TRACT | 12 refills | Status: DC | PRN

## 2019-03-16 NOTE — Progress Notes (Signed)
HPI female smoker followed for allergic rhinitis, recurrent acute bronchitis, tobacco use Allergy Profile 06/16/13 Total IgE 41.7   Pos dust mite, dog, [place] grass, ragwee Office Spirometry 06/13/2015-within normal limits. FVC 3.55/117%, FEV1 2.64/111%, FEV1/FVC 0.74 PFT 10/31/2013-within normal limits with no response to bronchodilator. FVC 3.91/120%, FEV1 3.08/123%, FEV1/FEC 0.79, FEF 25-75% 2.90/129%. TLC 113%, DLCO 86%. 6 minute walk test 10/31/2013-94%, 97%, 97%, 480 m. Normal oxygenation. Office Spirometry 06/13/2015-within normal limits. FVC 3.55/117%, FEV1 2.64/111%, FEV1/FVC 0.74 PFT 09/21/2018- minimal obstruction, insig resp to BD. FVC 3.51/ 113%, FEV1 2.69/ 113%, R .77, TLC 118%, DLCO 96% ----------------------------------------------------------------------------------------------  06/05/17- 68 year old female smoker followed for allergic rhinitis, recurrent acute bronchitis, tobacco use ---Pt states that she has been doing good. States that she has a "dry spot" in throat that makes her cough a lot and does have SOB with strenuous activity. Denies any CP.  Has been persistent for 4 months. Continues to smoke, but blames spring and fall pollens for variable dry cough sensation from her throat.  Denies fever, blood, adenopath and swallowing. Nebulizer/albuterol and albuterol rescue inhaler used occasionally. Breo 100.  UTD f&p  03/16/2019- 68 year old female smoker followed for allergic rhinitis, Chronic Bronchitis, tobacco use, [facility]UL VATS 09/28/18 for non-SCCa/ Dr Servando Snare, CAD -----pt states breathing is much better, pt states she takes Anoro every morning, hasn't had to use rescue inhaler; pt reports having a chronic dry, hacking cough  Anoro, singulair, albuterol hfa, neb albuterol Doing very well after VATs with minimal residual L chest wall pain. Chronic mild dry cough unchanged. Covid careful. PFT preop reviewed- minimal obstruction. She remains off cigarettes since March.  CXR  12/30/18-  IMPRESSION: 1. Postsurgical changes left chest. Left base pleural-parenchymal thickening again noted and is unchanged. No new infiltrates. 2.  Stable cardiomegaly.  ROS-see HPI + = positive Constitutional:   No-   weight loss, night sweats, fevers, chills, fatigue, lassitude. HEENT:   +headaches, difficulty swallowing, tooth/dental problems, sore throat,       sneezing,no- itching, ear ache, +nasal congestion, +post nasal drip,  CV:  No-   chest pain, orthopnea, PND, swelling in lower extremities, anasarca,  dizziness, palpitations Resp: No-   shortness of breath with exertion or at rest.             productive cough, +non-productive cough,  No- coughing up of blood.              No-   change in color of mucus.  No- wheezing Skin: No-   rash or lesions. GI:  No-   heartburn, indigestion, abdominal pain, nausea, vomiting,  GU:  MS:  No-   joint pain or swelling.   Neuro-     nothing unusual Psych:  No- change in mood or affect. No depression or anxiety.  No memory loss.  OBJ- Physical Exam General- Alert, Oriented, Affect-appropriate, Distress- none acute, + overweight Skin- rash-none, lesions- none, excoriation- none Lymphadenopathy- none Head- atraumatic            Eyes- Gross vision intact, PERRLA, conjunctivae and secretions clear            Ears- Hearing, canals-normal            Nose- Clear, no-Septal dev, mucus, polyps, erosion, perforation             Throat- Mallampati II , mucosa clear , drainage- none, tonsils- atrophic Neck- flexible , trachea midline, no stridor , thyroid nl, carotid no bruit Chest - symmetrical excursion , unlabored  Heart/CV- RRR , no murmur , no gallop  , no rub, nl s1 s2                           - JVD- none , edema- none, stasis changes- none, varices- none           Lung- + clear, wheeze -none, cough- none , dullness-none, rub- none           Chest wall-  Abd-  Br/ Gen/ Rectal- Not done, not indicated Extrem- cyanosis- none,  clubbing, none, atrophy- none, strength- nl Neuro- grossly intact to observation

## 2019-03-16 NOTE — Assessment & Plan Note (Signed)
Has remained abstinent since March, 2020 - congratulated and encouraged to continue.

## 2019-03-16 NOTE — Assessment & Plan Note (Signed)
Minimal chronic bronchitis. PFT much better than expected for her smoking hx.  Plan- continue present meds. Ok throat lozenges and otc cough syrup if needed.

## 2019-03-16 NOTE — Assessment & Plan Note (Signed)
She is aware of ASCVD. Followed with PCP

## 2019-03-16 NOTE — Patient Instructions (Signed)
Albuterol rescue inhaler refilled at [facility]  Please keep on being Covid careful, get flu shot this fall  I'm proud of you for stopping smoking !!  Please call if we can help

## 2019-04-06 DIAGNOSIS — K219 Gastro-esophageal reflux disease without esophagitis: Secondary | ICD-10-CM | POA: Insufficient documentation

## 2019-04-06 DIAGNOSIS — M8580 Other specified disorders of bone density and structure, unspecified site: Secondary | ICD-10-CM | POA: Insufficient documentation

## 2019-04-06 DIAGNOSIS — Z1231 Encounter for screening mammogram for malignant neoplasm of breast: Secondary | ICD-10-CM | POA: Diagnosis not present

## 2019-04-06 DIAGNOSIS — Z6833 Body mass index (BMI) 33.0-33.9, adult: Secondary | ICD-10-CM | POA: Diagnosis not present

## 2019-04-06 DIAGNOSIS — Z79890 Hormone replacement therapy (postmenopausal): Secondary | ICD-10-CM | POA: Insufficient documentation

## 2019-04-06 DIAGNOSIS — Z01419 Encounter for gynecological examination (general) (routine) without abnormal findings: Secondary | ICD-10-CM | POA: Diagnosis not present

## 2019-04-06 DIAGNOSIS — N959 Unspecified menopausal and perimenopausal disorder: Secondary | ICD-10-CM | POA: Diagnosis not present

## 2019-04-06 DIAGNOSIS — Z809 Family history of malignant neoplasm, unspecified: Secondary | ICD-10-CM | POA: Insufficient documentation

## 2019-04-06 DIAGNOSIS — C3490 Malignant neoplasm of unspecified part of unspecified bronchus or lung: Secondary | ICD-10-CM | POA: Insufficient documentation

## 2019-04-06 DIAGNOSIS — M1990 Unspecified osteoarthritis, unspecified site: Secondary | ICD-10-CM | POA: Insufficient documentation

## 2019-04-11 ENCOUNTER — Other Ambulatory Visit: Payer: Self-pay | Admitting: Obstetrics and Gynecology

## 2019-04-11 DIAGNOSIS — R928 Other abnormal and inconclusive findings on diagnostic imaging of breast: Secondary | ICD-10-CM

## 2019-04-12 ENCOUNTER — Other Ambulatory Visit: Payer: Self-pay

## 2019-04-12 ENCOUNTER — Ambulatory Visit
Admission: RE | Admit: 2019-04-12 | Discharge: 2019-04-12 | Disposition: A | Discharge status: Home / Self Care | Payer: Medicare Other | Source: Ambulatory Visit | Attending: Obstetrics and Gynecology | Admitting: Obstetrics and Gynecology

## 2019-04-12 ENCOUNTER — Ambulatory Visit: Payer: Medicare Other

## 2019-04-12 DIAGNOSIS — R928 Other abnormal and inconclusive findings on diagnostic imaging of breast: Secondary | ICD-10-CM

## 2019-04-14 ENCOUNTER — Ambulatory Visit
Admission: RE | Admit: 2019-04-14 | Discharge: 2019-04-14 | Disposition: A | Discharge status: Home / Self Care | Payer: Medicare Other | Source: Ambulatory Visit | Attending: Cardiothoracic Surgery | Admitting: Cardiothoracic Surgery

## 2019-04-14 ENCOUNTER — Ambulatory Visit (INDEPENDENT_AMBULATORY_CARE_PROVIDER_SITE_OTHER): Payer: Medicare Other | Admitting: Cardiothoracic Surgery

## 2019-04-14 ENCOUNTER — Other Ambulatory Visit: Payer: Self-pay

## 2019-04-14 VITALS — BP 138/78 | HR 77 | Temp 97.6°F | Resp 20 | Ht 63.0 in | Wt 196.0 lb

## 2019-04-14 DIAGNOSIS — Z902 Acquired absence of lung [part of]: Secondary | ICD-10-CM

## 2019-04-14 DIAGNOSIS — C3490 Malignant neoplasm of unspecified part of unspecified bronchus or lung: Secondary | ICD-10-CM

## 2019-04-14 DIAGNOSIS — Z85118 Personal history of other malignant neoplasm of bronchus and lung: Secondary | ICD-10-CM | POA: Diagnosis not present

## 2019-04-14 DIAGNOSIS — I2510 Atherosclerotic heart disease of native coronary artery without angina pectoris: Secondary | ICD-10-CM | POA: Diagnosis not present

## 2019-04-14 DIAGNOSIS — C3412 Malignant neoplasm of upper lobe, left bronchus or lung: Secondary | ICD-10-CM | POA: Diagnosis not present

## 2019-04-14 NOTE — Progress Notes (Signed)
[address]       [place] [address]             [phone]      Karen Barrera [facility] Medical Record #[id number] Date of Birth: Apr 24, 1951  Referring: Deneise Lever, MD Primary Care: [name], MD Primary Cardiologist: Shelva Majestic, MD   Chief Complaint:   POST OP FOLLOW UP 09/28/2018 PREOPERATIVE DIAGNOSIS:  Left upper lobe lung mass. POSTOPERATIVE DIAGNOSIS:  Left upper lobe lung mass, nonsmall-cell lung cancer by frozen section. PROCEDURE PERFORMED:  Bronchoscopy, left video-assisted thoracoscopy, wedge resection, left upper lobe, completion left upper lobectomy with lymph node dissection and intercostal nerve blocks. SURGEON:  Lanelle Bal, MD  History of Present Illness:     Patient returns to the office afterleft upper lobectomy for stage Ia 2 non-small cell carcinoma of the lung. Since surgery patient has increased her ambulation to up to 2 miles a day without difficulty.  She denies shortness of breath.  She has not smoked a cigarette since surgery.  She does complain of gaining weight since that time     Cancer Staging Lung cancer [facility]) Staging form: Lung, AJCC 8th Edition - Pathologic stage from 09/29/2018: Stage IA2 (pT1b, pN0, cM0) - Signed by Grace Isaac, MD on 09/29/2018    Past Medical History:  Diagnosis Date   Acute perforated appendicitis 10/31/2014   Adenomatous colon polyp    Allergic rhinitis    Allergy    Anemia    Arthritis    "fingers" (10/31/2014)   Chronic bronchitis ([place])    "got it q yr til I had my nose OR" (10/31/2014)   COPD (chronic obstructive pulmonary disease) ([place])    Coronary artery disease    DDD (degenerative disc disease), cervical    DDD (degenerative disc disease), lumbar    Depression    "short term when my sister passed away unexpectedly"   Family history of adverse reaction to anesthesia    "mother is hard to wake up; a little goes a long way w/her"   Family  history of breast cancer    Family history of colon cancer    Family history of skin cancer    GERD (gastroesophageal reflux disease)    History of stomach ulcers    Hyperlipidemia    "borderline; RX is preventative" (10/31/2014)   Kidney stones    "they passed"   Lung cancer ([place]) 09/28/2018   Migraine    "used to get them alot; don't get them anymore" (10/31/2014)   Osteoarthritis of both knees    Osteopenia    Pneumonia 2003 X 1   Pre-diabetes    Seizures ([place]) ~ 1962 X 1   "from an abscessed wisdom tooth"   Tobacco dependence    Vitamin D deficiency      Social History   Tobacco Use  Smoking Status Former Smoker   Packs/day: 1.00   Years: 50.00   Pack years: 50.00   Types: Cigarettes   Quit date: 09/27/2018   Years since quitting: 0.5  Smokeless Tobacco Never Used  Tobacco Comment   Quit 09/27/2018    Social History   Substance and Sexual Activity  Alcohol Use No   Alcohol/week: 0.0 standard drinks     Allergies  Allergen Reactions   Chantix [Varenicline] Anaphylaxis and Swelling    Makes throat swell up   Moxifloxacin Hcl In Nacl Other (See Comments)    aches  Current Outpatient Medications  Medication Sig Dispense Refill   albuterol (PROVENTIL) (2.5 MG/3ML) 0.083% nebulizer solution Take 2.5 mg by nebulization every 6 (six) hours as needed for wheezing or shortness of breath.     albuterol (VENTOLIN HFA) 108 (90 Base) MCG/ACT inhaler Inhale 2 puffs into the lungs every 6 (six) hours as needed for wheezing or shortness of breath. 18 g 12   alum & mag hydroxide-simeth (MAALOX/MYLANTA) 200-200-20 MG/5ML suspension Take 15-30 mLs by mouth every 6 (six) hours as needed for indigestion or heartburn.     ANORO ELLIPTA 62.5-25 MCG/INH AEPB USE 1 INHALATION ORALLY    DAILY 180 each 3   aspirin EC 81 MG tablet Take 81 mg by mouth daily.     buPROPion (WELLBUTRIN XL) 150 MG 24 hr tablet Take 1 tablet every morning for Mood 90 tablet 1    cetirizine (ZYRTEC) 10 MG tablet Take 10 mg by mouth daily.     Cholecalciferol (VITAMIN D-3) 125 MCG (5000 UT) TABS Take 10,000 Units by mouth daily. Please discuss dosage with medical doctor as is a high dose 30 tablet 0   Coenzyme Q10 (COQ10) 200 MG CAPS Take 200 mg by mouth daily.     diphenhydrAMINE (BENADRYL) 25 MG tablet Take 25 mg by mouth daily.     estradiol (ESTRACE) 1 MG tablet Take 1 mg by mouth daily.  3   ibuprofen (ADVIL,MOTRIN) 200 MG tablet Take 600 mg by mouth every 8 (eight) hours as needed for headache or moderate pain.      metoprolol succinate (TOPROL-XL) 25 MG 24 hr tablet TAKE 1 TABLET BY MOUTH EVERY DAY 90 tablet 1   montelukast (SINGULAIR) 10 MG tablet TAKE 1 TABLET AT BEDTIME 90 tablet 1   omeprazole (PRILOSEC) 40 MG capsule Take 1 capsule 2 x  /day with Bkfst & Supper for Heartburn & Reflux 180 capsule 3   rosuvastatin (CRESTOR) 40 MG tablet Take 1 tablet (40 mg total) by mouth daily. 90 tablet 1   sertraline (ZOLOFT) 100 MG tablet Take 1 tablet daily for Mood 90 tablet 3   No current facility-administered medications for this visit.        Physical Exam: BP 138/78    Pulse 77    Temp 97.6 F (36.4 C) (Skin)    Resp 20    Ht 5\' 3"  (1.6 m)    Wt 196 lb (88.9 kg)    SpO2 94% Comment: RA   BMI 34.72 kg/m  General appearance: alert, cooperative and no distress Head: Normocephalic, without obvious abnormality, atraumatic Neck: no adenopathy, no carotid bruit, no JVD, supple, symmetrical, trachea midline and thyroid not enlarged, symmetric, no tenderness/mass/nodules Lymph nodes: Cervical, supraclavicular, and axillary nodes normal. Resp: clear to auscultation bilaterally Cardio: regular rate and rhythm, S1, S2 normal, no murmur, click, rub or gallop GI: soft, non-tender; bowel sounds normal; no masses,  no organomegaly Extremities: extremities normal, atraumatic, no cyanosis or edema and Homans sign is negative, no sign of DVT Neurologic: Grossly  normal    Diagnostic Studies & Laboratory data:     Recent Radiology Findings:   Ct Chest Wo Contrast  Result Date: 04/14/2019 CLINICAL DATA:  Left upper lobe lung adenocarcinoma status post left upper lobectomy 09/28/2018. Restaging. EXAM: CT CHEST WITHOUT CONTRAST TECHNIQUE: Multidetector CT imaging of the chest was performed following the standard protocol without IV contrast. COMPARISON:  08/31/2018 PET-CT.  08/17/2018 chest CT. FINDINGS: Cardiovascular: Normal heart size. No significant pericardial effusion/thickening. Three-vessel coronary atherosclerosis.  Atherosclerotic nonaneurysmal thoracic aorta. Normal caliber pulmonary arteries. Mediastinum/Nodes: Stable subcentimeter hypodense upper right thyroid lobe nodule. Unremarkable esophagus. No pathologically enlarged axillary, mediastinal or hilar lymph nodes, noting limited sensitivity for the detection of hilar adenopathy on this noncontrast study. Lungs/Pleura: No pneumothorax. No pleural effusion. Status post left upper lobectomy. Perifissural 4 mm solid anterior left lower lobe pulmonary nodule (series 21/image 51), probably correlating with a 4 mm perifissural nodule seen on 08/17/2018 chest CT and stable. Peripheral left lower lobe 3 mm solid pulmonary nodule (series 21/image 71), not definitely seen on prior scan, although potentially obscured by atelectasis on prior. No acute consolidative airspace disease, lung masses or additional significant pulmonary nodules. Upper abdomen: Diffuse hepatic steatosis. Simple 4.8 cm segment 7 right liver cyst is unchanged. Stable 1.7 cm left adrenal adenoma with density 9 HU. Musculoskeletal: No aggressive appearing focal osseous lesions. Mild thoracic spondylosis. Prominent degenerative disc disease in the lower cervical spine. IMPRESSION: 1. No evidence of residual or recurrent tumor status post left upper lobectomy. 2. No findings highly suspicious for metastatic disease in the chest. Tiny 3 mm  peripheral left lower lobe solid pulmonary nodule, not definitely seen on preoperative chest CT, although potentially obscured by atelectasis on prior CT. Attention on surveillance chest CT advised. 3. Diffuse hepatic steatosis. 4. Three-vessel coronary atherosclerosis. Aortic Atherosclerosis (ICD10-I70.0). Electronically Signed   By: Ilona Sorrel M.D.   On: 04/14/2019 14:20    I have independently reviewed the above radiology studies  and reviewed the findings with the patient.    Recent Lab Findings: Lab Results  Component Value Date   WBC 6.1 03/07/2019   HGB 13.9 03/07/2019   HCT 42.9 03/07/2019   PLT 214 03/07/2019   GLUCOSE 79 03/07/2019   CHOL 171 03/07/2019   TRIG 172 (H) 03/07/2019   HDL 67 03/07/2019   LDLCALC 77 03/07/2019   ALT 35 (H) 03/07/2019   AST 34 03/07/2019   NA 137 03/07/2019   K 4.5 03/07/2019   CL 104 03/07/2019   CREATININE 0.76 03/07/2019   BUN 18 03/07/2019   CO2 21 03/07/2019   TSH 2.28 03/07/2019   INR 1.1 09/24/2018   HGBA1C 6.0 (H) 11/22/2018      Assessment / Plan:   Patient stable now 87months after left upper lobectomy for stage I A2 non-small cell lung cancer.  Follow-up ct chest  shows no evidence of residual or recurrent disease.  We will plan follow-up CT scan of the chest in 6 months which will be 12 months postop.   Grace Isaac MD      [place] [place] [address] Office [phone]   Beeper [phone]  04/14/2019 2:40 PM

## 2019-04-27 DIAGNOSIS — M67431 Ganglion, right wrist: Secondary | ICD-10-CM | POA: Diagnosis not present

## 2019-04-27 DIAGNOSIS — M1812 Unilateral primary osteoarthritis of first carpometacarpal joint, left hand: Secondary | ICD-10-CM | POA: Diagnosis not present

## 2019-04-27 DIAGNOSIS — M79641 Pain in right hand: Secondary | ICD-10-CM | POA: Diagnosis not present

## 2019-04-27 DIAGNOSIS — M79642 Pain in left hand: Secondary | ICD-10-CM | POA: Diagnosis not present

## 2019-04-28 DIAGNOSIS — Z23 Encounter for immunization: Secondary | ICD-10-CM | POA: Diagnosis not present

## 2019-05-07 ENCOUNTER — Other Ambulatory Visit: Payer: Self-pay | Admitting: Internal Medicine

## 2019-05-31 ENCOUNTER — Encounter: Payer: Self-pay | Admitting: Adult Health Nurse Practitioner

## 2019-05-31 ENCOUNTER — Ambulatory Visit (INDEPENDENT_AMBULATORY_CARE_PROVIDER_SITE_OTHER): Payer: Medicare Other | Admitting: Adult Health Nurse Practitioner

## 2019-05-31 ENCOUNTER — Other Ambulatory Visit: Payer: Self-pay

## 2019-05-31 VITALS — BP 130/70 | HR 86 | Temp 97.3°F | Wt 196.0 lb

## 2019-05-31 DIAGNOSIS — R059 Cough, unspecified: Secondary | ICD-10-CM

## 2019-05-31 DIAGNOSIS — J441 Chronic obstructive pulmonary disease with (acute) exacerbation: Secondary | ICD-10-CM | POA: Diagnosis not present

## 2019-05-31 DIAGNOSIS — Z902 Acquired absence of lung [part of]: Secondary | ICD-10-CM | POA: Diagnosis not present

## 2019-05-31 DIAGNOSIS — R05 Cough: Secondary | ICD-10-CM | POA: Diagnosis not present

## 2019-05-31 DIAGNOSIS — I2510 Atherosclerotic heart disease of native coronary artery without angina pectoris: Secondary | ICD-10-CM

## 2019-05-31 MED ORDER — AZITHROMYCIN 250 MG PO TABS: ORAL_TABLET | ORAL | 1 refills | Status: AC

## 2019-05-31 NOTE — Progress Notes (Signed)
Assessment and Plan:   Faelynn was seen today for acute visit, cough and nasal congestion.  Diagnoses and all orders for this visit:  Cough S/P lobectomy of lung, LUL  COPD with acute exacerbation ([place]) Take Muxinex 600mg  Q12 while having symptoms Discussed changing antihistamine, printed education provided -     azithromycin (ZITHROMAX) 250 MG tablet; Take 2 tablets (500 mg) on  Day 1,  followed by 1 tablet (250 mg) once daily on Days 2 through 5. Continue Anoro daily, rinse mouth after use Use nebulizer should wheezing occur Contact office with any new or worsening symptoms If persistent contact pulmonology   Discussed hospital precautions with patient and agrees with plan of care.  We will contact you in 1-3 business days with lab results drawn today.  Call or return with new or worsening symptoms as discussed in appointment.  May contact office via phone [phone] or [place].    Further disposition pending results of labs. Discussed med's effects and SE's.   Over 30 minutes of interview exam, counseling, chart review, and critical decision making was performed.   Future Appointments  Date Time Provider [place]  08/01/2019  1:40 PM Troy Sine, MD [facility]  08/02/2019  2:00 PM Vicie Mutters, PA-C [facility] None  09/16/2019 10:30 AM Baird Lyons D, MD [facility] None  12/01/2019  9:00 AM Vicie Mutters, PA-C [facility] None    ------------------------------------------------------------------------------------------------------------------   HPI 68 y.o.female presents for evaluation of cough.  She reports this has been going on for about a month.   Her cough is productive and she is having yellowish phlem.  It is continuous cough, nothing makes it worse and she reports nothing makes it better.  She has not tried nay OTC products for this.  Denies any shortness of breath or changes to her cough or breathing when laying down at night.  Reports  today she is having an increase in runny nose and drainage. She is taking zyrtec daily and singular.  She is using her Anoro daily and risning her mouth after each use..  She has not had to use her albuterol rescue inhaler or nebulizer.  Denies any wheezing.  Reports scratchy throat that strated today.  Denies any soreness to her throat, oltagia, vertigo, drainage or popping or cracking in her ears. Denies fever or chills, fatigue or malaise, anosmia or ageusia.   She has history of lung cancer s/p left upper lobe lobectomy, stage ia 2 non-small cell carcinoma, completed in 09/2018 by Dr Servando Snare.  Reports she has been doing well up until this point.  Her next follow up is 08/2018 with surgeon and CT.  She follow with Pulmonologist, Dr Annamaria Boots.   Past Medical History:  Diagnosis Date  . Acute perforated appendicitis 10/31/2014  . Adenomatous colon polyp   . Allergic rhinitis   . Allergy   . Anemia   . Arthritis    "fingers" (10/31/2014)  . Chronic bronchitis ([place])    "got it q yr til I had my nose OR" (10/31/2014)  . COPD (chronic obstructive pulmonary disease) ([place])   . Coronary artery disease   . DDD (degenerative disc disease), cervical   . DDD (degenerative disc disease), lumbar   . Depression    "short term when my sister passed away unexpectedly"  . Family history of adverse reaction to anesthesia    "mother is hard to wake up; a little goes a long way w/her"  . Family history of breast cancer   .  Family history of colon cancer   . Family history of skin cancer   . GERD (gastroesophageal reflux disease)   . History of stomach ulcers   . Hyperlipidemia    "borderline; RX is preventative" (10/31/2014)  . Kidney stones    "they passed"  . Lung cancer ([place]) 09/28/2018  . Migraine    "used to get them alot; don't get them anymore" (10/31/2014)  . Osteoarthritis of both knees   . Osteopenia   . Pneumonia 2003 X 1  . Pre-diabetes   . Seizures ([place]) ~ 1962 X 1   "from an abscessed wisdom  tooth"  . Tobacco dependence   . Vitamin D deficiency      Allergies  Allergen Reactions  . Chantix [Varenicline] Anaphylaxis and Swelling    Makes throat swell up  . Moxifloxacin Hcl In Nacl Other (See Comments)    aches    Current Outpatient Medications on File Prior to Visit  Medication Sig  . albuterol (PROVENTIL) (2.5 MG/3ML) 0.083% nebulizer solution Take 2.5 mg by nebulization every 6 (six) hours as needed for wheezing or shortness of breath.  Marland Kitchen albuterol (VENTOLIN HFA) 108 (90 Base) MCG/ACT inhaler Inhale 2 puffs into the lungs every 6 (six) hours as needed for wheezing or shortness of breath.  Jearl Klinefelter ELLIPTA 62.5-25 MCG/INH AEPB USE 1 INHALATION ORALLY    DAILY  . aspirin EC 81 MG tablet Take 81 mg by mouth daily.  Marland Kitchen buPROPion (WELLBUTRIN XL) 150 MG 24 hr tablet Take 1 tablet every morning for Mood  . cetirizine (ZYRTEC) 10 MG tablet Take 10 mg by mouth daily.  . Cholecalciferol (VITAMIN D-3) 125 MCG (5000 UT) TABS Take 10,000 Units by mouth daily. Please discuss dosage with medical doctor as is a high dose  . Coenzyme Q10 (COQ10) 200 MG CAPS Take 200 mg by mouth daily.  . diphenhydrAMINE (BENADRYL) 25 MG tablet Take 25 mg by mouth daily.  Marland Kitchen estradiol (ESTRACE) 1 MG tablet Take 1 mg by mouth daily.  Marland Kitchen ibuprofen (ADVIL,MOTRIN) 200 MG tablet Take 600 mg by mouth every 8 (eight) hours as needed for headache or moderate pain.   . metoprolol succinate (TOPROL-XL) 25 MG 24 hr tablet TAKE 1 TABLET BY MOUTH EVERY DAY  . montelukast (SINGULAIR) 10 MG tablet Take 1 tablet Daily for Allergies  . omeprazole (PRILOSEC) 40 MG capsule Take 1 capsule 2 x  /day with Bkfst & Supper for Heartburn & Reflux  . rosuvastatin (CRESTOR) 40 MG tablet Take 1 tablet (40 mg total) by mouth daily.  . sertraline (ZOLOFT) 100 MG tablet Take 1 tablet daily for Mood   No current facility-administered medications on file prior to visit.     ROS: all negative except above.    Physical Exam:  BP 130/70    Pulse 86   Temp (!) 97.3 F (36.3 C)   Wt 196 lb (88.9 kg)   SpO2 97%   BMI 34.72 kg/m   General Appearance: Well nourished, in no apparent distress. Eyes: PERRLA, EOMs, conjunctiva no swelling or erythema Sinuses: No Frontal/maxillary tenderness ENT/Mouth: Ext aud canals clear, TMs without erythema, bulging. No erythema, swelling, or exudate on post pharynx.  Tonsils not swollen or erythematous. Hearing normal.  Neck: Supple, thyroid normal.  Respiratory: Respiratory effort normal, BS bilaterally with rales, rhonchi.  BS absent LUL. No wheezing or stridor.  Cardio: RRR with no MRGs. Brisk peripheral pulses without edema.  Abdomen: Soft, + BS.  Non tender, no guarding, rebound,  hernias, masses. Lymphatics: Non tender without lymphadenopathy.  Musculoskeletal: Full ROM, 5/5 strength, normal gait.  Skin: Warm, dry without rashes, lesions, ecchymosis.  Neuro: Cranial nerves intact. Normal muscle tone, no cerebellar symptoms. Sensation intact.  Psych: Awake and oriented X 3, normal affect, Insight and Judgment appropriate.     Garnet Sierras, NP 14:35 PM [facility]

## 2019-05-31 NOTE — Patient Instructions (Addendum)
    We are going to sent in a prescription for Azithromyacin    Mucinex 600mg  every 12 hours while having symptoms.  Continue to use your Anoro daily and rinse mouth after use.   Allergy Symptoms / Runny Nose: Chose one  Zyrtec / Cetirizine Take 10mg  by mouth May cause drowsiness, take nightly Be sure to drink plenty of water If this is not effective, try Xyzal or Allegra  OR  Xyzal / Levocetirazine  Take 5mg  by mouth May cause drowsiness, take nightly Be sure to drink plenty of water If this is not effective try Allegra or Zyrtec  OR  Allegra / fexofenadine Take 180mg  by mouth daily If this is not effective try Zyrtec or Xyzal   *If you battle with chronic allergies you may need to change the antihistamine you currently use to find most effective.   You may continue taking Singular with one of the above antihistamines.    Please let us know if you have any new or worsening symptoms.  Should you have any wheezing use your nebulizer.

## 2019-07-07 ENCOUNTER — Other Ambulatory Visit: Payer: Self-pay | Admitting: Physician Assistant

## 2019-08-01 ENCOUNTER — Encounter: Payer: Self-pay | Admitting: Cardiovascular Disease

## 2019-08-01 ENCOUNTER — Ambulatory Visit (INDEPENDENT_AMBULATORY_CARE_PROVIDER_SITE_OTHER): Payer: Medicare Other | Admitting: Cardiovascular Disease

## 2019-08-01 ENCOUNTER — Other Ambulatory Visit: Payer: Self-pay

## 2019-08-01 VITALS — BP 112/71 | HR 72 | Ht 64.0 in | Wt 196.4 lb

## 2019-08-01 DIAGNOSIS — I2510 Atherosclerotic heart disease of native coronary artery without angina pectoris: Secondary | ICD-10-CM | POA: Diagnosis not present

## 2019-08-01 DIAGNOSIS — E785 Hyperlipidemia, unspecified: Secondary | ICD-10-CM | POA: Diagnosis not present

## 2019-08-01 DIAGNOSIS — C3492 Malignant neoplasm of unspecified part of left bronchus or lung: Secondary | ICD-10-CM

## 2019-08-01 DIAGNOSIS — Z6833 Body mass index (BMI) 33.0-33.9, adult: Secondary | ICD-10-CM

## 2019-08-01 DIAGNOSIS — E6609 Other obesity due to excess calories: Secondary | ICD-10-CM

## 2019-08-01 DIAGNOSIS — J449 Chronic obstructive pulmonary disease, unspecified: Secondary | ICD-10-CM

## 2019-08-01 DIAGNOSIS — I700 Atherosclerosis of aorta: Secondary | ICD-10-CM

## 2019-08-01 NOTE — Progress Notes (Signed)
CPE AND 3 MONTH   Assessment:   Dysgeusia x 1 year ? From wellbutrin, will stop that, follow up with dentist, will check labs.  -     ANA- Scleroderma/Sjogren/SLE/RA/Polymyositis/Dermatomyositis/JRA/Graves/Connective Tissue/Autoimmune Hepatitis -     Sjogrens syndrome-B extractable nuclear antibody -     Sjogrens syndrome-A extractable nuclear antibody  CPE 1 year  Aortic atherosclerosis (HCC) Control blood pressure, cholesterol, glucose, increase exercise.   COPD mixed type ([place]) No triggers, well controlled symptoms, cont to monitor  Malignant neoplasm of upper lobe of left lung ([place]) S/p lobectomy, continue follow up  Recurrent major depressive disorder, in full remission ([place]) -     TSH  Mixed hyperlipidemia -     Lipid panel check lipids decrease fatty foods increase activity.   Anemia, unspecified type -     CBC with Differential/Platelet -     Iron,Total/Total Iron Binding Cap -     Ferritin - monitor, continue iron supp with Vitamin C and increase green leafy veggies  Vitamin D deficiency Continue supplement  Medication management -     CBC with Differential/Platelet -     COMPLETE METABOLIC PANEL WITH GFR -     Magnesium  Class 1 obesity due to excess calories with serious comorbidity and body mass index (BMI) of 30.0 to 30.9 in adult - follow up 3 months for progress monitoring - increase veggies, decrease carbs - long discussion about weight loss, diet, and exercise  Fatty liver -     COMPLETE METABOLIC PANEL WITH GFR Weight loss advised, will monitor LFTs  Agatston coronary artery calcium score greater than 400 Control blood pressure, cholesterol, glucose, increase exercise.  Following with Dr. Claiborne Billings  Hot flashes Continue meds  Hx of adenomatous colonic polyps UTD  Genetic testing Negative  Abnormal glucose -     Hemoglobin A1c Discussed disease progression and risks Discussed diet/exercise, weight management and risk  modification  B12 deficiency -     Vitamin B12  Over 40 minutes of exam, counseling, chart review and critical decision making was performed Future Appointments  Date Time Provider [place]  09/16/2019 10:30 AM Baird Lyons D, MD [facility] None  12/01/2019  9:00 AM Vicie Mutters, PA-C [facility] None  08/01/2020  2:00 PM Vicie Mutters, PA-C [facility] None    Subjective:  Karen Barrera is a 69 y.o. female who presents for CPE and 3 month follow up    Her blood pressure has been controlled at home, today their BP is BP: 128/70 She does not workout, she is cleaning, being active inside but not doing any working out. She follows with Dr. Claiborne Billings, saw him yesterday and will have follow up in 1 year.  She denies chest pain, shortness of breath, dizziness.   She has COPD, s/p left lobectomy for stage 2 non small cell carcinoma on 09/28/2018 with Dr. Servando Snare, she continues to have some left sided nerve pain. She has not smoked since March 3rd 2020. Very random dry cough but has improved, she is on the anoro and has albuterol to take as needed.   She is on prilosec 40 mg daily, will still have 1-2 episodes of heart burn once a week.  She occ has reflux, has been taking ibuprofen for diffuse joint pain. She has bilateral knee, elbow, hip, hand pain.  She states she has been unable to taste anything since her surgery, she can taste some sweet and sour, she can taste salty.  She has had some restless legs  since her surgery.  Lab Results  Component Value Date   [id number] 11/22/2018    BMI is Body mass index is 33.85 kg/m., she is working on diet and exercise. She does 4-5 diet soda a day, no fruit juice, no sweet tea, no nuts. She does not eat breakfast. Wt Readings from Last 10 Encounters:  08/02/19 197 lb 3.2 oz (89.4 kg)  08/01/19 196 lb 6.4 oz (89.1 kg)  05/31/19 196 lb (88.9 kg)  04/14/19 196 lb (88.9 kg)  03/16/19 194 lb 9.6 oz (88.3 kg)  03/07/19 193 lb 3.2 oz  (87.6 kg)  01/24/19 190 lb 6.4 oz (86.4 kg)  12/30/18 188 lb (85.3 kg)  11/22/18 184 lb (83.5 kg)  10/21/18 175 lb (79.4 kg)   She does have history of stress incontinence.    She is on cholesterol medication and denies myalgias. Her cholesterol is at goal. The cholesterol last visit was:   Lab Results  Component Value Date   CHOL 171 03/07/2019   HDL 67 03/07/2019   LDLCALC 77 03/07/2019   TRIG 172 (H) 03/07/2019   CHOLHDL 2.6 03/07/2019   Last A1C  Lab Results  Component Value Date   HGBA1C 6.0 (H) 11/22/2018   Last GFR: Lab Results  Component Value Date   GFRNONAA 81 03/07/2019   Patient is on Vitamin D supplement.   Lab Results  Component Value Date   VD25OH 63 03/07/2019     She was on estrogen pills from GYN and lexapro for hot flashes/depression which is helping some, she is on low dose ASA.   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    Medication Review:  Current Outpatient Medications (Endocrine & Metabolic):  .  estradiol (ESTRACE) 1 MG tablet, Take 1 mg by mouth daily.  Current Outpatient Medications (Cardiovascular):  .  metoprolol succinate (TOPROL-XL) 25 MG 24 hr tablet, TAKE 1 TABLET BY MOUTH EVERY DAY .  rosuvastatin (CRESTOR) 40 MG tablet, Take 1 tablet (40 mg total) by mouth daily.  Current Outpatient Medications (Respiratory):  .  albuterol (PROVENTIL) (2.5 MG/3ML) 0.083% nebulizer solution, Take 2.5 mg by nebulization every 6 (six) hours as needed for wheezing or shortness of breath. Marland Kitchen  albuterol (VENTOLIN HFA) 108 (90 Base) MCG/ACT inhaler, Inhale 2 puffs into the lungs every 6 (six) hours as needed for wheezing or shortness of breath. Jearl Klinefelter ELLIPTA 62.5-25 MCG/INH AEPB, USE 1 INHALATION ORALLY    DAILY .  cetirizine (ZYRTEC) 10 MG tablet, Take 10 mg by mouth daily. .  diphenhydrAMINE (BENADRYL) 25 MG tablet, Take 25 mg by mouth daily. .  montelukast (SINGULAIR) 10 MG tablet, Take 1  tablet Daily for Allergies  Current Outpatient Medications (Analgesics):  .  aspirin EC 81 MG tablet, Take 81 mg by mouth daily. Marland Kitchen  ibuprofen (ADVIL,MOTRIN) 200 MG tablet, Take 600 mg by mouth every 8 (eight) hours as needed for headache or moderate pain.    Current Outpatient Medications (Other):  Marland Kitchen  buPROPion (WELLBUTRIN XL) 150 MG 24 hr tablet, TAKE ONE TABLET BY MOUTH EVERY MORNING FOR MOOD .  Cholecalciferol (VITAMIN D-3) 125 MCG (5000 UT) TABS, Take 10,000 Units by mouth daily. Please discuss dosage with medical doctor as is a high dose .  Coenzyme Q10 (COQ10) 200 MG CAPS, Take 200 mg by mouth daily. Marland Kitchen  omeprazole (PRILOSEC) 40 MG capsule, Take 1 capsule 2 x  /day with Bkfst &  Supper for Heartburn & Reflux .  sertraline (ZOLOFT) 100 MG tablet, Take 1 tablet daily for Mood   Allergies  Allergen Reactions  . Chantix [Varenicline] Anaphylaxis and Swelling    Makes throat swell up  . Moxifloxacin Hcl In Nacl Other (See Comments)    aches    Current Problems (verified) Patient Active Problem List   Diagnosis Date Noted  . Lung cancer ([place]) 09/28/2018  . Agatston coronary artery calcium score greater than 400   . Fatty liver 08/29/2018  . Aortic atherosclerosis ([place]) 08/06/2017  . Genetic testing 04/24/2017  . Hot flashes 11/12/2016  . Obesity 11/12/2016  . Encounter for Medicare annual wellness exam 11/12/2016  . COPD mixed type ([place]) 06/05/2016  . Costochondritis 03/23/2015  . Hx of adenomatous colonic polyps 01/23/2015  . Medication management 11/21/2014  . Depression   . Hyperlipidemia   . Anemia   . Vitamin D deficiency   . Tobacco dependence   . Seasonal and perennial allergic rhinitis 07/03/2013    Screening Tests Immunization History  Administered Date(s) Administered  . Influenza Split 05/16/2013, 05/12/2014, 05/14/2015  . Influenza, High Dose Seasonal PF 05/15/2017, 05/07/2018, 03/29/2019  . Influenza, Seasonal, Injecte, Preservative Fre 05/13/2016  .  Pneumococcal Conjugate-13 11/12/2016  . Pneumococcal Polysaccharide-23 06/14/2011  . Tdap 03/27/2010  . Zoster 04/14/2012   Tetanus: 2011 Pneumovax:  2012 Prevnar 13: 2018 Flu vaccine: 2020 Zostavax: 2013 Shingrix 2021 has gotten both  MGM: 03/2019   at GYN DEXA: 2017  At GYN Osteopenia- DUE 2019 PAP 2016 never abnormal, last one Colonoscopy: 2016, due in 5 years/2021 Dr. Carlean Purl PFT 08/2018 CT chest 03/2019 Cath 08/2018  Names of Other Physician/Practitioners you currently use: 1.  [facility] here for primary care 2. Dr. Dorena Cookey, eye doctor, last visit 2020 3. Dr. Luretha Rued, dentist Patient Care Team: [name], MD as PCP - General (Internal Medicine) Troy Sine, MD as PCP - Cardiology (Cardiology) Lavonna Monarch, MD as Consulting Physician (Dermatology) Troy Sine, MD as Consulting Physician (Cardiology) Thornell Sartorius, MD as Consulting Physician (Otolaryngology) Izora Gala, MD as Consulting Physician (Otolaryngology) Sable Feil, MD as Consulting Physician (Gastroenterology) Delila Pereyra, MD as Consulting Physician (Gynecology) Gatha Mayer, MD as Consulting Physician (Gastroenterology)  SURGICAL HISTORY She  has a past surgical history that includes Shoulder arthroscopy w/ rotator cuff repair (Left, 2012); Tendon repair (Left, 10/2009); Shoulder arthroscopy (Right, 2003); Salpingoophorectomy (Right, 1999); Sinus exploration (12/2013); Vaginal hysterectomy (1978); laparoscopic appendectomy (N/A, 10/31/2014); Appendectomy; Colonoscopy; Cyst excision (Right, 01/11/2016); Arthrotomy (Right, 01/11/2016); LEFT HEART CATH AND CORONARY ANGIOGRAPHY (N/A, 09/06/2018); Video bronchoscopy (N/A, 09/28/2018); and Video assisted thoracoscopy (vats)/wedge resection (Left, 09/28/2018). FAMILY HISTORY Her family history includes Arthritis in her paternal grandmother; Cancer in an other family member; Colon cancer in an other family  member; Colon cancer (age of onset: 37) in some other family members; Colon polyps in her father; Diabetes in her mother; Hyperlipidemia in her maternal grandfather; Melanoma (age of onset: 90) in her paternal grandmother; Seizures in her sister; Skin cancer (age of onset: 73) in her mother; Stroke in her maternal grandmother. SOCIAL HISTORY She  reports that she quit smoking about 10 months ago. Her smoking use included cigarettes. She has a 50.00 pack-year smoking history. She has never used smokeless tobacco. She reports that she does not drink alcohol or use drugs.  Review of Systems  Constitutional: Negative.   HENT: Negative.  Negative for congestion and sinus pain.   Eyes: Negative.  Respiratory: Positive for cough. Negative for hemoptysis, sputum production, shortness of breath and wheezing.   Cardiovascular: Negative.  Negative for chest pain and leg swelling.  Gastrointestinal: Positive for heartburn. Negative for abdominal pain, blood in stool, constipation, diarrhea, melena, nausea and vomiting.  Genitourinary: Negative.   Musculoskeletal: Positive for joint pain. Negative for back pain, falls, myalgias and neck pain.  Skin: Negative.  Negative for rash.  Neurological: Negative.  Negative for dizziness, tingling and sensory change.  Endo/Heme/Allergies: Negative.   Psychiatric/Behavioral: Positive for memory loss. Negative for depression, hallucinations, substance abuse and suicidal ideas. The patient is not nervous/anxious and does not have insomnia.      Objective:     Today's Vitals   08/02/19 1358  BP: 128/70  Pulse: 74  Temp: (!) 97.3 F (36.3 C)  SpO2: 97%  Weight: 197 lb 3.2 oz (89.4 kg)  Height: 5\' 4"  (1.626 m)   Body mass index is 33.85 kg/m.  General appearance: alert, no distress, WD/WN, female HEENT: normocephalic, sclerae anicteric, TMs pearly, nares patent, no discharge or erythema, pharynx normal Oral cavity: MMM, no lesions Neck: supple, no  lymphadenopathy, no thyromegaly, no masses Heart: RRR, normal S1, S2, 1/6 systolic murmur Lungs: CTA bilaterally, no wheezes, rhonchi, or rales Abdomen: +bs, soft, + epigastric tenderness, non distended, no masses, no hepatomegaly, no splenomegaly Musculoskeletal: nontender, no swelling, no obvious deformity Extremities: no edema, no cyanosis, no clubbing Pulses: 2+ symmetric, upper and lower extremities, normal cap refill Neurological: alert, oriented x 3, CN2-12 intact, strength normal upper extremities and lower extremities, sensation normal throughout, DTRs 2+ throughout, no cerebellar signs, gait normal Psychiatric: normal affect, behavior normal, pleasant    Vicie Mutters, PA-C   08/02/2019

## 2019-08-01 NOTE — Progress Notes (Signed)
Cardiology Office Note    Date:  08/02/2019   ID:  Karen Barrera, DOB 1951/03/08, MRN [id number]  Pulmonary: Dr. Keturah Barrera PCP:  Karen Pinto, MD  Cardiologist:  Karen Majestic, MD   F/u cardiology evaluation initially referred through the courtesy of Dr. Keturah Barrera for evaluation of coronary calcification.  History of Present Illness:  Karen Barrera is a 69 y.o. female who is the daughter of my patient, Karen Barrera.  She is followed by Dr. Melford Barrera for primary care and Dr. Annamaria Barrera for pulmonary.  She was initially referred for cardiology evaluation after being found to have coronary calcification.  She presents for 42-monthfollow-up cardiology evaluation.  Ms. Karen Salmonhas a long history of tobacco use.  She started smoking at age [age]and typically has smoked 1 pack/day.  She has smoked for 57 years and stopped during her pregnancies.  She is followed by Dr. YAnnamaria Bootsfor allergic rhinitis, recurrent acute bronchitis, as well as lung nodules.  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  She had recently undergone a chest CT which showed a stable left upper lobe nodule adjacent to bulla formation.  It was recommended that she undergo unenhanced chest CT in 12 months.  On her CT she was found to have coronary artery calcifications as well as a stable right hepatic cyst.  She admits to occasional episodes of left chest wall like discomfort.  Occasionally she has noticed some sporadic chest tightness but typically this has not been exertional.  Usually is short-lived and last only 2 to 3 minutes.  In addition to her longstanding smoking history, there is family history for cardiovascular disease.  Remotely, she believes she may have had a stress test in a years ago.  She has a history of a cracked sternum approximately 3 years ago.    When I initially saw her in December 2019 she denied any exertional type of chest pain.  However, with her longstanding tobacco history, and family  history for CAD as well as documented coronary calcification I recommended a CT coronary angiogram as well as a 2D echo Doppler study.  We discussed the importance of smoking cessation.  In addition with her previous coronary calcification LDL cholesterol of 93 I recommended titration of rosuvastatin to 20 mg.  Her echo Doppler study revealed an EF of 55 to 60% with grade 1 diastolic dysfunction.  There is very mild dilation of the aortic root.  There were no valvular abnormalities.  Her CTA was abnormal and showed coronary artery calcium score of 891 placing her in the 97th percentile for age and gender.  She was felt to have possible 70 to 90% stenosis in the proximal LAD and mild stenosis in the proximal circumflex.  FFR analysis was abnormal in the mid LAD at 0.76 suggesting hemodynamically significant proximal LAD stenosis.  Chest CT also revealed an interval increase in soft tissue thickening along the posterior and lateral aspect of the left upper lobe bulla and although this could represent infectious etiology an underlying neoplasm was suspected.  As result she is to undergo subsequent PET imaging.  She again was found to have aortic atherosclerosis, coronary atherosclerosis as well as emphysema.  She was also noted to have hepatosteatosis and probable enlarging posterior right hepatic lobe cyst.   When I  saw her on August 26, 2018 I recommended further titration of rosuvastatin to 40 mg and added Toprol-XL 25 mg to her medical regimen.  She underwent  definitive cardiac catheterization on September 06, 2018 by me which showed moderate coronary calcification without high-grade obstructive disease.  There was a 20 to 30% proximal to mid LAD stenosis with 55% mid stenosis in the mid vessel beyond the small diagonal vessel, more significant calcification involving the proximal left circumflex coronary artery with 20% proximal narrowing and 20% narrowing in the OM1 vessel, and 30 and 20% proximal to mid RCA  stenoses.  LVEDP was 18 mm.  She had normal LV function without focal segmental wall motion abnormalities.  Medical therapy was recommended.  Smoking cessation was essential.  Since her catheterization, she was felt to have progression of a lung nodule ultimately led to bronchoscopy with left video-assisted thoracoscopy, wedge resection, and left upper lobectomy with lymph node dissection by Dr. Darius Barrera.  Postoperative diagnosis was non-small cell lung cancer by frozen section stage I A2 (pT1b, pN 0, cMO).  She did not require any adjunctive chemotherapy.  I last saw her in June 2020 at which time she was remaining stable and was without anginal symptomatology.  She had quit smoking on September 27, 2018.  Over the last 6 months, she has continued to do well.  She specifically denies chest pain or any symptoms of exertional dyspnea.  She has seen Dr. Servando Barrera in follow-up evaluation of her non-small cell CA. a follow-up chest CT did not show any evidence of residual or recurrent disease.  The plan is for her to follow-up CT scan of her chest in 6 months which will be 12 months postoperatively.  She presents for follow-up Cardiologic evaluation.   Past Medical History:  Diagnosis Date  . Acute perforated appendicitis 10/31/2014  . Adenomatous colon polyp   . Allergic rhinitis   . Allergy   . Anemia   . Arthritis    "fingers" (10/31/2014)  . Chronic bronchitis ([place])    "got it q yr til I had my nose OR" (10/31/2014)  . COPD (chronic obstructive pulmonary disease) ([place])   . Coronary artery disease   . DDD (degenerative disc disease), cervical   . DDD (degenerative disc disease), lumbar   . Depression    "short term when my sister passed away unexpectedly"  . Family history of adverse reaction to anesthesia    "mother is hard to wake up; a little goes a long way w/her"  . Family history of breast cancer   . Family history of colon cancer   . Family history of skin cancer   . GERD (gastroesophageal  reflux disease)   . History of stomach ulcers   . Hyperlipidemia    "borderline; RX is preventative" (10/31/2014)  . Kidney stones    "they passed"  . Lung cancer ([place]) 09/28/2018  . Migraine    "used to get them alot; don't get them anymore" (10/31/2014)  . Osteoarthritis of both knees   . Osteopenia   . Pneumonia 2003 X 1  . Pre-diabetes   . Seizures ([place]) ~ 1962 X 1   "from an abscessed wisdom tooth"  . Tobacco dependence   . Vitamin D deficiency     Past Surgical History:  Procedure Laterality Date  . APPENDECTOMY    . ARTHROTOMY Right 01/11/2016   Procedure: DISTAL INTERPHALANGEAL JOINT ARTHROTOMY RIGHT INDEX FINGER;  Surgeon: Leanora Cover, MD;  Location: [facility];  Service: Orthopedics;  Laterality: Right;  . COLONOSCOPY    . CYST EXCISION Right 01/11/2016   Procedure: EXCISION MUCOID CYST;  Surgeon: Leanora Cover, MD;  Location: [facility];  Service: Orthopedics;  Laterality: Right;  . LAPAROSCOPIC APPENDECTOMY N/A 10/31/2014   Procedure: APPENDECTOMY LAPAROSCOPIC;  Surgeon: Alphonsa Overall, MD;  Location: [facility];  Service: General;  Laterality: N/A;  . LEFT HEART CATH AND CORONARY ANGIOGRAPHY N/A 09/06/2018   Procedure: LEFT HEART CATH AND CORONARY ANGIOGRAPHY;  Surgeon: Troy Sine, MD;  Location: [facility];  Service: Cardiovascular;  Laterality: N/A;  . SALPINGOOPHORECTOMY Right 1999   "w/grapefruit-sized polyp"  . SHOULDER ARTHROSCOPY Right 2003   removal spurs  . SHOULDER ARTHROSCOPY W/ ROTATOR CUFF REPAIR Left 2012  . SINUS EXPLORATION  12/2013   [facility]  . TENDON REPAIR Left 10/2009   torn tendon @ elbow  . VAGINAL HYSTERECTOMY  1978  . VIDEO ASSISTED THORACOSCOPY (VATS)/WEDGE RESECTION Left 09/28/2018   Procedure: LEFT VIDEO ASSISTED THORACOSCOPY (VATS)/Completion Left Upper lobe Lobectomy. Lymph node dissection. Intercostal nerve block.;  Surgeon: Grace Isaac, MD;  Location: [facility];  Service: Thoracic;  Laterality: Left;  Marland Kitchen  VIDEO BRONCHOSCOPY N/A 09/28/2018   Procedure: VIDEO BRONCHOSCOPY;  Surgeon: Grace Isaac, MD;  [facility];  Service: Thoracic;  Laterality: N/A;    Current Medications: Outpatient Medications Prior to Visit  Medication Sig Dispense Refill  . albuterol (PROVENTIL) (2.5 MG/3ML) 0.083% nebulizer solution Take 2.5 mg by nebulization every 6 (six) hours as needed for wheezing or shortness of breath.    Marland Kitchen albuterol (VENTOLIN HFA) 108 (90 Base) MCG/ACT inhaler Inhale 2 puffs into the lungs every 6 (six) hours as needed for wheezing or shortness of breath. 18 g 12  . ANORO ELLIPTA 62.5-25 MCG/INH AEPB USE 1 INHALATION ORALLY    DAILY 180 each 3  . aspirin EC 81 MG tablet Take 81 mg by mouth daily.    Marland Kitchen buPROPion (WELLBUTRIN XL) 150 MG 24 hr tablet TAKE ONE TABLET BY MOUTH EVERY MORNING FOR MOOD 90 tablet 0  . cetirizine (ZYRTEC) 10 MG tablet Take 10 mg by mouth daily.    . Cholecalciferol (VITAMIN D-3) 125 MCG (5000 UT) TABS Take 10,000 Units by mouth daily. Please discuss dosage with medical doctor as is a high dose 30 tablet 0  . Coenzyme Q10 (COQ10) 200 MG CAPS Take 200 mg by mouth daily.    . diphenhydrAMINE (BENADRYL) 25 MG tablet Take 25 mg by mouth daily.    Marland Kitchen estradiol (ESTRACE) 1 MG tablet Take 1 mg by mouth daily.  3  . ibuprofen (ADVIL,MOTRIN) 200 MG tablet Take 600 mg by mouth every 8 (eight) hours as needed for headache or moderate pain.     . metoprolol succinate (TOPROL-XL) 25 MG 24 hr tablet TAKE 1 TABLET BY MOUTH EVERY DAY 90 tablet 1  . montelukast (SINGULAIR) 10 MG tablet Take 1 tablet Daily for Allergies 90 tablet 3  . omeprazole (PRILOSEC) 40 MG capsule Take 1 capsule 2 x  /day with Bkfst & Supper for Heartburn & Reflux 180 capsule 3  . rosuvastatin (CRESTOR) 40 MG tablet Take 1 tablet (40 mg total) by mouth daily. 90 tablet 1  . sertraline (ZOLOFT) 100 MG tablet Take 1 tablet daily for Mood 90 tablet 3   No facility-administered medications prior to visit.      Allergies:   Chantix [varenicline] and Moxifloxacin hcl in nacl   Social History   Socioeconomic History  . Marital status: Married    Spouse name: Not on file  . Number of children: Not on file  . Years of education:  Not on file  . Highest education level: Not on file  Occupational History  . Occupation: Retired  Tobacco Use  . Smoking status: Former Smoker    Packs/day: 1.00    Years: 50.00    Pack years: 50.00    Types: Cigarettes    Quit date: 09/27/2018    Years since quitting: 0.8  . Smokeless tobacco: Never Used  . Tobacco comment: Quit 09/27/2018  Substance and Sexual Activity  . Alcohol use: No    Alcohol/week: 0.0 standard drinks  . Drug use: No  . Sexual activity: Not Currently  Other Topics Concern  . Not on file  Social History Narrative  . Not on file   Social Determinants of Health   Financial Resource Strain:   . Difficulty of Paying Living Expenses: Not on file  Food Insecurity:   . Worried About Charity fundraiser in the Last Year: Not on file  . Ran Out of Food in the Last Year: Not on file  Transportation Needs:   . Lack of Transportation (Medical): Not on file  . Lack of Transportation (Non-Medical): Not on file  Physical Activity:   . Days of Exercise per Week: Not on file  . Minutes of Exercise per Session: Not on file  Stress:   . Feeling of Stress : Not on file  Social Connections:   . Frequency of Communication with Friends and Family: Not on file  . Frequency of Social Gatherings with Friends and Family: Not on file  . Attends Religious Services: Not on file  . Active Member of Clubs or Organizations: Not on file  . Attends Archivist Meetings: Not on file  . Marital Status: Not on file    Social history is notable in that she is married for 50 years.  She has 2 children ages 26 and 20.  One child was stillborn at 33 months.  She is retired.  She has a BS in business and marketing.  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  She does not routinely exercise.  Family History:  The patient's family history includes Arthritis in her paternal grandmother; Cancer in an other family member; Colon cancer in an other family member; Colon cancer (age of onset: 32) in some other family members; Colon polyps in her father; Diabetes in her mother; Hyperlipidemia in her maternal grandfather; Melanoma (age of onset: 30) in her paternal grandmother; Seizures in her sister; Skin cancer (age of onset: 70) in her mother; Stroke in her maternal grandmother.   ROS General: Negative; No fevers, chills, or night sweats;  HEENT: Negative; No changes in vision or hearing, sinus congestion, difficulty swallowing Pulmonary: COPD, lung nodule Cardiovascular: See HPI GI: Negative; No nausea, vomiting, diarrhea, or abdominal pain GU: Negative; No dysuria, hematuria, or difficulty voiding Musculoskeletal: Negative; no myalgias, joint pain, or weakness Hematologic/Oncology: Negative; no easy bruising, bleeding Endocrine: Negative; no heat/cold intolerance; no diabetes Neuro: Negative; no changes in balance, headaches Skin: Negative; No rashes or skin lesions Psychiatric: Negative; No behavioral problems, depression Sleep: Negative; No snoring, daytime sleepiness, hypersomnolence, bruxism, restless legs, hypnogognic hallucinations, no cataplexy Other comprehensive 14 point system review is negative.   PHYSICAL EXAM:   VS:  BP 112/71   Pulse 72   Ht _0  (1.626 m)   Wt 196 lb 6.4 oz (89.1 kg)   SpO2 97%   BMI 33.71 kg/m    Repeat blood pressure by me was 110/70  Wt Readings from Last 3 Encounters:  08/01/19 196 lb 6.4 oz (89.1 kg)  05/31/19 196 lb (88.9 kg)  04/14/19 196 lb (88.9 kg)    General: Alert, oriented, no distress.  Skin: normal turgor, no rashes, warm and dry HEENT: Normocephalic, atraumatic. Pupils equal round and reactive to light; sclera anicteric; extraocular muscles intact;  Nose  without nasal septal hypertrophy Mouth/Parynx benign; Mallinpatti scale Neck: No JVD, no carotid bruits; normal carotid upstroke Lungs: clear to ausculatation and percussion; no wheezing or rales Chest wall: without tenderness to palpitation Heart: PMI not displaced, RRR, s1 s2 normal, 1/6 systolic murmur, no diastolic murmur, no rubs, gallops, thrills, or heaves Abdomen: soft, nontender; no hepatosplenomehaly, BS+; abdominal aorta nontender and not dilated by palpation. Back: no CVA tenderness Pulses 2+ Musculoskeletal: full range of motion, normal strength, no joint deformities Extremities: no clubbing cyanosis or edema, Homan's sign negative  Neurologic: grossly nonfocal; Cranial nerves grossly wnl Psychologic: Normal mood and affect   Studies/Labs Reviewed:   EKG:  EKG is ordered today.  ECG (independently read by me): Normal sinus rhythm at 72 bpm.  T wave abnormality V1 through V3.  QTc interval 457 ms.  January 24, 2019 ECG (independently read by me):NSR at 38, Nonspecic T wave abnormality V2-3  August 26, 2018 ECG (independently read by me): Normal sinus rhythm at 75 bpm.  Possible left atrial enlargement.  QTc interval 462 ms.  PR interval 176 ms.  July 15, 2018 ECG (independently read by me): Sinus rhythm at 78 bpm.  No ectopy.  Normal intervals.  Early transition.  Recent Labs: BMP Latest Ref Rng & Units 03/07/2019 11/22/2018 10/12/2018  Glucose 65 - 99 mg/dL 79 92 79  BUN 7 - 25 mg/dL _0 Creatinine 0.50 - 0.99 mg/dL 0.76 0.93 0.86  BUN/Creat Ratio 6 - 22 (calc) NOT APPLICABLE NOT APPLICABLE NOT APPLICABLE  Sodium 017 - 146 mmol/L 137 137 138  Potassium 3.5 - 5.3 mmol/L 4.5 4.5 4.5  Chloride 98 - 110 mmol/L 104 104 104  CO2 20 - 32 mmol/L _1 Calcium 8.6 - 10.4 mg/dL 9.3 9.7 9.0     Hepatic Function Latest Ref Rng & Units 03/07/2019 11/22/2018 10/12/2018  Total Protein 6.1 - 8.1 g/dL 7.1 6.9 6.0(L)  Albumin 3.5 - 5.0 g/dL - - -  AST 10 - 35 U/L 34 19 15   ALT 6 - 29 U/L 35(H) 21 22  Alk Phosphatase 38 - 126 U/L - - -  Total Bilirubin 0.2 - 1.2 mg/dL 0.4 0.3 0.2  Bilirubin, Direct 0.0 - 0.2 mg/dL - - -    CBC Latest Ref Rng & Units 03/07/2019 11/22/2018 10/12/2018  WBC 3.8 - 10.8 Thousand/uL 6.1 7.0 11.1(H)  Hemoglobin 11.7 - 15.5 g/dL 13.9 13.8 12.7  Hematocrit 35.0 - 45.0 % 42.9 41.0 37.8  Platelets 140 - 400 Thousand/uL 214 223 379   Lab Results  Component Value Date   MCV 88.6 03/07/2019   MCV 88.6 11/22/2018   MCV 89.6 10/12/2018   Lab Results  Component Value Date   TSH 2.28 03/07/2019   Lab Results  Component Value Date   HGBA1C 6.0 (H) 11/22/2018     BNP No results found for: BNP  ProBNP No results found for: PROBNP   Lipid Panel     Component Value Date/Time   CHOL 171 03/07/2019 1153   TRIG 172 (H) 03/07/2019 1153   HDL 67 03/07/2019 1153   CHOLHDL 2.6 03/07/2019  1153   VLDL 29 11/12/2016 1730   LDLCALC 77 03/07/2019 1153     RADIOLOGY: No results found.   Additional studies/ records that were reviewed today include:  Reviewed the records of Dr. Annamaria Barrera, her spirometry, chest CT and laboratory.  ASSESSMENT:    1. Coronary artery disease involving native coronary artery of native heart without angina pectoris   2. Hyperlipidemia LDL goal <70   3. Aortic atherosclerosis ([place])   4. Non-small cell cancer of left lung ([place])   5. Chronic obstructive pulmonary disease, unspecified COPD type ([place])   6. Hyperlipidemia with target LDL less than 70   7. Class 1 obesity due to excess calories with serious comorbidity and body mass index (BMI) of 33.0 to 33.9 in adult     PLAN:  Karen Barrera is a 69 year old female who had a 47-year history of tobacco use, having started at age 25.  She has a history of surgical rhinitis, recurrent acute bronchitis, mixed COPD and has been on Singulair, pro-air, and Anora Ellipta.  She has had intermittent episodes of nonradiating short-lived chest discomfort, not  typically exertionally precipitated.  A chest CT from August 2019 revealed stable left upper lobe nodule but she was noted to have coronary artery calcification suggesting CAD.  When I initially saw her, I titrated her rosuvastatin to 20 mg in light of her LDL of 93. An echo Doppler study demonstrated normal systolic function with grade 1 diastolic dysfunction.  Due to her coronary calcification and significant tobacco history she underwent coronary CTA.  Calcium score was significantly increased at 91 placing her in the 97th percentile for age and gender.  She was found to have probable 70 to 90% severe stenosis in the proximal LAD and on on FFR assessment this was felt to be hemodynamically significant at 0.76.  Subsequent cardiac catheterization  revealed evidence for coronary calcification with mild nonobstructive CAD for which medical therapy was recommended.  She subsequently found to have progressive increase in her soft tissue thickening along the posterior lateral left upper lobe bulla and underwent ultimate bronchoscopy with left upper lobectomy and lymph node dissection for non-small cell CA cancer by frozen section.  She is felt to be clear and does not require chemotherapy.  Presently, she feels well and is without anginal symptoms.  Her blood pressure today is controlled on her medical regimen consisting of Toprol-XL 25 mg daily.  She continues to be on aspirin for antiplatelet benefit.  She is now on rosuvastatin 40 mg for aggressive lipid-lowering therapy with target LDL less than 70.  She is not having any wheezing on her current regimen of Anoro Ellipta in addition to as needed albuterol.  She continues to be mildly obese with a BMI of 33.71.  I discussed the importance of weight loss, increased exercise with walking a minimum of 5 days/week for 30 minutes with moderate intensity if at all possible.  She will be undergoing follow-up CT imaging of her chest per recommendations of Dr. Servando Barrera in  several months which will be 12 months following her left upper lung lobectomy.  Being her primary physicians with repeat laboratory.  As long as she remains stable I will see her in 1 year for follow-up Cardiologic evaluation or sooner if problems arise.   Time spent: 25 minutes  Medication Adjustments/Labs and Tests Ordered: Current medicines are reviewed at length with the patient today.  Concerns regarding medicines are outlined above.  Medication changes, Labs and Tests ordered today  are listed in the Patient Instructions below. Patient Instructions  Medication Instructions:  No changes *If you need a refill on your cardiac medications before your next appointment, please call your pharmacy*  Lab Work: None needed If you have labs (blood work) drawn today and your tests are completely normal, you will receive your results only by: Marland Kitchen MyChart Message (if you have MyChart) OR . A paper copy in the mail If you have any lab test that is abnormal or we need to change your treatment, we will call you to review the results.  Testing/Procedures: None needed  Follow-Up: At [facility], you and your health needs are our priority.  As part of our continuing mission to provide you with exceptional heart care, we have created designated Provider Care Teams.  These Care Teams include your primary Cardiologist (physician) and Advanced Practice Providers (APPs -  Physician Assistants and Nurse Practitioners) who all work together to provide you with the care you need, when you need it.  Your next appointment:   1 year(s)  The format for your next appointment:   In Person  Provider:   Shelva Majestic, MD      Signed, Karen Majestic, MD  08/02/2019 11:14 AM    [facility] [address] [place], [place], [place]  [address] Phone: [phone]

## 2019-08-01 NOTE — Patient Instructions (Signed)
Medication Instructions:  No changes *If you need a refill on your cardiac medications before your next appointment, please call your pharmacy*  Lab Work: None needed If you have labs (blood work) drawn today and your tests are completely normal, you will receive your results only by: Marland Kitchen MyChart Message (if you have MyChart) OR . A paper copy in the mail If you have any lab test that is abnormal or we need to change your treatment, we will call you to review the results.  Testing/Procedures: None needed  Follow-Up: At [facility], you and your health needs are our priority.  As part of our continuing mission to provide you with exceptional heart care, we have created designated Provider Care Teams.  These Care Teams include your primary Cardiologist (physician) and Advanced Practice Providers (APPs -  Physician Assistants and Nurse Practitioners) who all work together to provide you with the care you need, when you need it.  Your next appointment:   1 year(s)  The format for your next appointment:   In Person  Provider:   Shelva Majestic, MD

## 2019-08-02 ENCOUNTER — Ambulatory Visit (INDEPENDENT_AMBULATORY_CARE_PROVIDER_SITE_OTHER): Payer: Medicare Other | Admitting: Physician Assistant

## 2019-08-02 ENCOUNTER — Encounter: Payer: Self-pay | Admitting: Cardiovascular Disease

## 2019-08-02 ENCOUNTER — Encounter: Payer: Self-pay | Admitting: Physician Assistant

## 2019-08-02 VITALS — BP 128/70 | HR 74 | Temp 97.3°F | Ht 64.0 in | Wt 197.2 lb

## 2019-08-02 DIAGNOSIS — Z79899 Other long term (current) drug therapy: Secondary | ICD-10-CM

## 2019-08-02 DIAGNOSIS — E782 Mixed hyperlipidemia: Secondary | ICD-10-CM

## 2019-08-02 DIAGNOSIS — E538 Deficiency of other specified B group vitamins: Secondary | ICD-10-CM

## 2019-08-02 DIAGNOSIS — I700 Atherosclerosis of aorta: Secondary | ICD-10-CM | POA: Diagnosis not present

## 2019-08-02 DIAGNOSIS — I10 Essential (primary) hypertension: Secondary | ICD-10-CM

## 2019-08-02 DIAGNOSIS — R432 Parageusia: Secondary | ICD-10-CM | POA: Diagnosis not present

## 2019-08-02 DIAGNOSIS — D649 Anemia, unspecified: Secondary | ICD-10-CM | POA: Diagnosis not present

## 2019-08-02 DIAGNOSIS — F17200 Nicotine dependence, unspecified, uncomplicated: Secondary | ICD-10-CM

## 2019-08-02 DIAGNOSIS — J449 Chronic obstructive pulmonary disease, unspecified: Secondary | ICD-10-CM

## 2019-08-02 DIAGNOSIS — Z6830 Body mass index (BMI) 30.0-30.9, adult: Secondary | ICD-10-CM

## 2019-08-02 DIAGNOSIS — C3490 Malignant neoplasm of unspecified part of unspecified bronchus or lung: Secondary | ICD-10-CM

## 2019-08-02 DIAGNOSIS — R931 Abnormal findings on diagnostic imaging of heart and coronary circulation: Secondary | ICD-10-CM

## 2019-08-02 DIAGNOSIS — F3342 Major depressive disorder, recurrent, in full remission: Secondary | ICD-10-CM | POA: Diagnosis not present

## 2019-08-02 DIAGNOSIS — E66811 Other obesity due to excess calories: Secondary | ICD-10-CM

## 2019-08-02 DIAGNOSIS — R7309 Other abnormal glucose: Secondary | ICD-10-CM | POA: Diagnosis not present

## 2019-08-02 DIAGNOSIS — E559 Vitamin D deficiency, unspecified: Secondary | ICD-10-CM

## 2019-08-02 DIAGNOSIS — Z1379 Encounter for other screening for genetic and chromosomal anomalies: Secondary | ICD-10-CM

## 2019-08-02 DIAGNOSIS — Z0001 Encounter for general adult medical examination with abnormal findings: Secondary | ICD-10-CM

## 2019-08-02 DIAGNOSIS — E6609 Other obesity due to excess calories: Secondary | ICD-10-CM

## 2019-08-02 NOTE — Patient Instructions (Addendum)
Will stop wellbutrin Take the prilosec twice a day for 1 month and see if this helps  Will check some labs on you  Follow up with your dentist  Follow up here 3 months to check on your weight, can discuss meds then if you are still having an issue  General eating tips  What to Avoid . Avoid added sugars o Often added sugar can be found in processed foods such as many condiments, dry cereals, cakes, cookies, chips, crisps, crackers, candies, sweetened drinks, etc.  o Read labels and AVOID/DECREASE use of foods with the following in their ingredient list: Sugar, fructose, high fructose corn syrup, sucrose, glucose, maltose, dextrose, molasses, cane sugar, brown sugar, any type of syrup, agave nectar, etc.   . Avoid snacking in between meals- drink water or if you feel you need a snack, pick a high water content snack such as cucumbers, watermelon, or any veggie.  Marland Kitchen Avoid foods made with flour o If you are going to eat food made with flour, choose those made with whole-grains; and, minimize your consumption as much as is tolerable . Avoid processed foods o These foods are generally stocked in the middle of the grocery store.  o Focus on shopping on the perimeter of the grocery.  What to Include . Vegetables o GREEN LEAFY VEGETABLES: Kale, spinach, mustard greens, collard greens, cabbage, broccoli, etc. o OTHER: Asparagus, cauliflower, eggplant, carrots, peas, Brussel sprouts, tomatoes, bell peppers, zucchini, beets, cucumbers, etc. . Grains, seeds, and legumes o Beans: kidney beans, black eyed peas, garbanzo beans, black beans, pinto beans, etc. o Whole, unrefined grains: brown rice, barley, bulgur, oatmeal, etc. . Healthy fats  o Avoid highly processed fats such as vegetable oil o Examples of healthy fats: avocado, olives, virgin olive oil, dark chocolate (?72% Cocoa), nuts (peanuts, almonds, walnuts, cashews, pecans, etc.) o Please still do small amount of these healthy fats, they are  dense in calories.  . Low - Moderate Intake of Animal Sources of Protein o Meat sources: chicken, [place], salmon, tuna. Limit to 4 ounces of meat at one time or the size of your palm. o Consider limiting dairy sources, but when choosing dairy focus on: PLAIN [place] yogurt, cottage cheese, high-protein milk . Fruit o Choose berries

## 2019-08-03 LAB — CBC WITH DIFFERENTIAL/PLATELET
Absolute Monocytes: 556 cells/uL (ref 200–950)
Basophils Absolute: 80 cells/uL (ref 0–200)
Basophils Relative: 1.2 %
Eosinophils Absolute: 127 cells/uL (ref 15–500)
Eosinophils Relative: 1.9 %
HCT: 40 % (ref 35.0–45.0)
Hemoglobin: 13 g/dL (ref 11.7–15.5)
Lymphs Abs: 1769 cells/uL (ref 850–3900)
MCH: 28.6 pg (ref 27.0–33.0)
MCHC: 32.5 g/dL (ref 32.0–36.0)
MCV: 87.9 fL (ref 80.0–100.0)
MPV: 10.3 fL (ref 7.5–12.5)
Monocytes Relative: 8.3 %
Neutro Abs: 4167 cells/uL (ref 1500–7800)
Neutrophils Relative %: 62.2 %
Platelets: 202 10*3/uL (ref 140–400)
RBC: 4.55 10*6/uL (ref 3.80–5.10)
RDW: 13.3 % (ref 11.0–15.0)
Total Lymphocyte: 26.4 %
WBC: 6.7 10*3/uL (ref 3.8–10.8)

## 2019-08-03 LAB — MICROALBUMIN / CREATININE URINE RATIO
Creatinine, Urine: 86 mg/dL (ref 20–275)
Microalb Creat Ratio: 12 mcg/mg creat (ref <30)
Microalb, Ur: 1 mg/dL

## 2019-08-03 LAB — COMPLETE METABOLIC PANEL WITH GFR
AG Ratio: 1.8 (calc) (ref 1.0–2.5)
ALT: 38 U/L — ABNORMAL HIGH (ref 6–29)
AST: 36 U/L — ABNORMAL HIGH (ref 10–35)
Albumin: 4.2 g/dL (ref 3.6–5.1)
Alkaline phosphatase (APISO): 62 U/L (ref 37–153)
BUN: 17 mg/dL (ref 7–25)
CO2: 23 mmol/L (ref 20–32)
Calcium: 9.2 mg/dL (ref 8.6–10.4)
Chloride: 105 mmol/L (ref 98–110)
Creat: 0.78 mg/dL (ref 0.50–0.99)
GFR, Est African American: 91 mL/min/{1.73_m2} (ref >=60)
GFR, Est Non African American: 78 mL/min/{1.73_m2} (ref >=60)
Globulin: 2.4 g/dL (calc) (ref 1.9–3.7)
Glucose, Bld: 83 mg/dL (ref 65–99)
Potassium: 4.1 mmol/L (ref 3.5–5.3)
Sodium: 138 mmol/L (ref 135–146)
Total Bilirubin: 0.3 mg/dL (ref 0.2–1.2)
Total Protein: 6.6 g/dL (ref 6.1–8.1)

## 2019-08-03 LAB — IRON, TOTAL/TOTAL IRON BINDING CAP
%SAT: 18 % (calc) (ref 16–45)
Iron: 66 ug/dL (ref 45–160)
TIBC: 368 mcg/dL (calc) (ref 250–450)

## 2019-08-03 LAB — URINALYSIS, ROUTINE W REFLEX MICROSCOPIC
Bilirubin Urine: NEGATIVE
Glucose, UA: NEGATIVE
Hgb urine dipstick: NEGATIVE
Ketones, ur: NEGATIVE
Leukocytes,Ua: NEGATIVE
Nitrite: NEGATIVE
Protein, ur: NEGATIVE
Specific Gravity, Urine: 1.019 (ref 1.001–1.03)
pH: 6 (ref 5.0–8.0)

## 2019-08-03 LAB — LIPID PANEL
Cholesterol: 151 mg/dL (ref <200)
HDL: 59 mg/dL (ref >=50)
LDL Cholesterol (Calc): 67 mg/dL (calc)
Non-HDL Cholesterol (Calc): 92 mg/dL (calc) (ref <130)
Total CHOL/HDL Ratio: 2.6 (calc) (ref <5.0)
Triglycerides: 169 mg/dL — ABNORMAL HIGH (ref <150)

## 2019-08-03 LAB — HEMOGLOBIN A1C
Hgb A1c MFr Bld: 6.2 % of total Hgb — ABNORMAL HIGH (ref <5.7)
Mean Plasma Glucose: 131 (calc)
eAG (mmol/L): 7.3 (calc)

## 2019-08-03 LAB — ANA: Anti Nuclear Antibody (ANA): NEGATIVE

## 2019-08-03 LAB — SJOGRENS SYNDROME-A EXTRACTABLE NUCLEAR ANTIBODY: SSA (Ro) (ENA) Antibody, IgG: <1 AI

## 2019-08-03 LAB — VITAMIN D 25 HYDROXY (VIT D DEFICIENCY, FRACTURES): Vit D, 25-Hydroxy: 60 ng/mL (ref 30–100)

## 2019-08-03 LAB — VITAMIN B12: Vitamin B-12: 503 pg/mL (ref 200–1100)

## 2019-08-03 LAB — TSH: TSH: 2.09 mIU/L (ref 0.40–4.50)

## 2019-08-03 LAB — MAGNESIUM: Magnesium: 2.1 mg/dL (ref 1.5–2.5)

## 2019-08-03 LAB — FERRITIN: Ferritin: 121 ng/mL (ref 16–288)

## 2019-08-03 LAB — SJOGRENS SYNDROME-B EXTRACTABLE NUCLEAR ANTIBODY: SSB (La) (ENA) Antibody, IgG: <1 AI

## 2019-08-06 ENCOUNTER — Other Ambulatory Visit: Payer: Self-pay | Admitting: Physician Assistant

## 2019-08-30 ENCOUNTER — Other Ambulatory Visit: Payer: Self-pay | Admitting: Cardiovascular Disease

## 2019-09-02 ENCOUNTER — Other Ambulatory Visit: Payer: Self-pay | Admitting: Cardiothoracic Surgery

## 2019-09-02 DIAGNOSIS — C3490 Malignant neoplasm of unspecified part of unspecified bronchus or lung: Secondary | ICD-10-CM

## 2019-09-16 ENCOUNTER — Ambulatory Visit: Payer: Medicare Other | Admitting: Internal Medicine

## 2019-09-19 ENCOUNTER — Other Ambulatory Visit: Payer: Self-pay | Admitting: Physician Assistant

## 2019-10-01 ENCOUNTER — Ambulatory Visit: Payer: Medicare Other | Attending: Internal Medicine

## 2019-10-01 DIAGNOSIS — Z23 Encounter for immunization: Secondary | ICD-10-CM

## 2019-10-01 NOTE — Progress Notes (Signed)
   Covid-19 Vaccination Clinic  Name:  Karen Barrera [facility]    MRN: [id number] DOB: 27-Jul-1951  10/01/2019  Karen Barrera was observed post Covid-19 immunization for 15 minutes without incident. She was provided with Vaccine Information Sheet and instruction to access the V-Safe system.   Karen Barrera was instructed to call 911 with any severe reactions post vaccine: Marland Kitchen Difficulty breathing  . Swelling of face and throat  . A fast heartbeat  . A bad rash all over body  . Dizziness and weakness   Immunizations Administered    Name Date Dose VIS Date Route   Pfizer COVID-19 Vaccine 10/01/2019  4:46 PM 0.3 mL 07/08/2019 Intramuscular   Manufacturer: [place]   Lot: [id number]   [place]: [address]-5

## 2019-10-18 ENCOUNTER — Encounter: Payer: Self-pay | Admitting: Internal Medicine

## 2019-10-18 ENCOUNTER — Other Ambulatory Visit: Payer: Self-pay

## 2019-10-18 ENCOUNTER — Ambulatory Visit (INDEPENDENT_AMBULATORY_CARE_PROVIDER_SITE_OTHER): Payer: Medicare Other | Admitting: Internal Medicine

## 2019-10-18 DIAGNOSIS — J449 Chronic obstructive pulmonary disease, unspecified: Secondary | ICD-10-CM | POA: Diagnosis not present

## 2019-10-18 DIAGNOSIS — C3490 Malignant neoplasm of unspecified part of unspecified bronchus or lung: Secondary | ICD-10-CM | POA: Diagnosis not present

## 2019-10-18 DIAGNOSIS — F17200 Nicotine dependence, unspecified, uncomplicated: Secondary | ICD-10-CM | POA: Diagnosis not present

## 2019-10-18 DIAGNOSIS — I2510 Atherosclerotic heart disease of native coronary artery without angina pectoris: Secondary | ICD-10-CM | POA: Diagnosis not present

## 2019-10-18 MED ORDER — ALBUTEROL SULFATE HFA 108 (90 BASE) MCG/ACT IN AERS: INHALATION_SPRAY | RESPIRATORY_TRACT | 3 refills | Status: DC

## 2019-10-18 MED ORDER — ANORO ELLIPTA 62.5-25 MCG/INH IN AEPB: INHALATION_SPRAY | RESPIRATORY_TRACT | 3 refills | Status: DC

## 2019-10-18 MED ORDER — ALBUTEROL SULFATE (2.5 MG/3ML) 0.083% IN NEBU: 2.5000 mg | INHALATION_SOLUTION | Freq: Four times a day (QID) | RESPIRATORY_TRACT | 12 refills | Status: DC | PRN

## 2019-10-18 NOTE — Assessment & Plan Note (Signed)
LULobectomy. No recurrence. Plan- F/U as planned with T-sgy, watching 36mm LLL nodule. Keep appointment for CT and visit with T-sgy.

## 2019-10-18 NOTE — Patient Instructions (Signed)
Med refills ordered  You can talk with Dr Servando Snare and Dr Melford Aase about the tiny nodule and atherosclerosis on the September CT scan  Please call if we can help

## 2019-10-18 NOTE — Assessment & Plan Note (Signed)
Remains off tobacco since 2020. Encouraged to continue the good work.

## 2019-10-18 NOTE — Assessment & Plan Note (Signed)
Comfortable and stable Plan- refill Anoro

## 2019-10-18 NOTE — Progress Notes (Signed)
HPI female smoker followed for allergic rhinitis, recurrent acute bronchitis, tobacco use Allergy Profile 06/16/13 Total IgE 41.7   Pos dust mite, dog, [place] grass, ragwee Office Spirometry 06/13/2015-within normal limits. FVC 3.55/117%, FEV1 2.64/111%, FEV1/FVC 0.74 PFT 10/31/2013-within normal limits with no response to bronchodilator. FVC 3.91/120%, FEV1 3.08/123%, FEV1/FEC 0.79, FEF 25-75% 2.90/129%. TLC 113%, DLCO 86%. 6 minute walk test 10/31/2013-94%, 97%, 97%, 480 m. Normal oxygenation. Office Spirometry 06/13/2015-within normal limits. FVC 3.55/117%, FEV1 2.64/111%, FEV1/FVC 0.74 PFT 09/21/2018- minimal obstruction, insig resp to BD. FVC 3.51/ 113%, FEV1 2.69/ 113%, R .77, TLC 118%, DLCO 96% ----------------------------------------------------------------------------------------------   03/16/2019- 69 year old female smoker followed for allergic rhinitis, Chronic Bronchitis, tobacco use, LUL VATS 09/28/18 for non-SCCa/ Dr Servando Snare, CAD -----pt states breathing is much better, pt states she takes Anoro every morning, hasn't had to use rescue inhaler; pt reports having a chronic dry, hacking cough  Anoro, singulair, albuterol hfa, neb albuterol Doing very well after VATs with minimal residual L chest wall pain. Chronic mild dry cough unchanged. Covid careful. PFT preop reviewed- minimal obstruction. She remains off cigarettes since March.  CXR 12/30/18-  IMPRESSION: 1. Postsurgical changes left chest. Left base pleural-parenchymal thickening again noted and is unchanged. No new infiltrates. 2.  Stable cardiomegaly.  10/18/19-  69 year old female former (quit 2020) smoker followed for allergic rhinitis, Chronic Bronchitis, tobacco use, LUL VATS 09/28/18 for non-SCCa/ Dr Servando Snare, CAD ------f/u COPD. Breathing is at her baseline.  Body weight today 199 lbs 1 Covax No recent use of rescue inhaler, continues Anoro She considers her breathing "excellent" for her, with no exacerbation all  winter, thanks to restrictions.  Pending f/u CT this week, per Dr Servando Snare, f/u p lobectomy.  Had heart cath before that. We reviewed CT images and report.  Says she lost her sense of taste, except for sweets, after lobectomy.  CT chest 04/14/2019- 1. No evidence of residual or recurrent tumor status post left upper lobectomy. 2. No findings highly suspicious for metastatic disease in the chest. Tiny 3 mm peripheral left lower lobe solid pulmonary nodule, not definitely seen on preoperative chest CT, although potentially obscured by atelectasis on prior CT. Attention on surveillance chest CT advised. 3. Diffuse hepatic steatosis. 4. Three-vessel coronary atherosclerosis. Aortic Atherosclerosis (ICD10-I70.0).  ROS-see HPI + = positive Constitutional:   No-   weight loss, night sweats, fevers, chills, fatigue, lassitude. HEENT:   +headaches, difficulty swallowing, tooth/dental problems, sore throat,       sneezing,no- itching, ear ache, +nasal congestion, +post nasal drip,  CV:  No-   chest pain, orthopnea, PND, swelling in lower extremities, anasarca,  dizziness, palpitations Resp: No-   shortness of breath with exertion or at rest.             productive cough, +non-productive cough,  No- coughing up of blood.              No-   change in color of mucus.  No- wheezing Skin: No-   rash or lesions. GI:  No-   heartburn, indigestion, abdominal pain, nausea, vomiting,  GU:  MS:  No-   joint pain or swelling.   Neuro-     nothing unusual Psych:  No- change in mood or affect. No depression or anxiety.  No memory loss.  OBJ- Physical Exam General- Alert, Oriented, Affect-appropriate, Distress- none acute, + overweight Skin- rash-none, lesions- none, excoriation- none Lymphadenopathy- none Head- atraumatic            Eyes- Gross vision intact,  PERRLA, conjunctivae and secretions clear            Ears- Hearing, canals-normal            Nose- Clear, no-Septal dev, mucus, polyps, erosion,  perforation             Throat- Mallampati II , mucosa clear , drainage- none, tonsils- atrophic Neck- flexible , trachea midline, no stridor , thyroid nl, carotid no bruit Chest - symmetrical excursion , unlabored           Heart/CV- RRR , no murmur , no gallop  , no rub, nl s1 s2                           - JVD- none , edema- none, stasis changes- none, varices- none           Lung- + clear, wheeze -none, cough- none , dullness-none, rub- none           Chest wall-  Abd-  Br/ Gen/ Rectal- Not done, not indicated Extrem- cyanosis- none, clubbing, none, atrophy- none, strength- nl Neuro- grossly intact to observation

## 2019-10-20 ENCOUNTER — Other Ambulatory Visit: Payer: Self-pay

## 2019-10-20 ENCOUNTER — Ambulatory Visit (INDEPENDENT_AMBULATORY_CARE_PROVIDER_SITE_OTHER): Payer: Medicare Other | Admitting: Cardiothoracic Surgery

## 2019-10-20 ENCOUNTER — Ambulatory Visit
Admission: RE | Admit: 2019-10-20 | Discharge: 2019-10-20 | Disposition: A | Discharge status: Home / Self Care | Payer: Medicare Other | Source: Ambulatory Visit | Attending: Cardiothoracic Surgery | Admitting: Cardiothoracic Surgery

## 2019-10-20 VITALS — BP 103/65 | HR 76 | Temp 96.8°F | Resp 20 | Ht 64.0 in | Wt 199.0 lb

## 2019-10-20 DIAGNOSIS — Z85118 Personal history of other malignant neoplasm of bronchus and lung: Secondary | ICD-10-CM

## 2019-10-20 DIAGNOSIS — I2510 Atherosclerotic heart disease of native coronary artery without angina pectoris: Secondary | ICD-10-CM | POA: Diagnosis not present

## 2019-10-20 DIAGNOSIS — J984 Other disorders of lung: Secondary | ICD-10-CM | POA: Diagnosis not present

## 2019-10-20 DIAGNOSIS — Z902 Acquired absence of lung [part of]: Secondary | ICD-10-CM | POA: Diagnosis not present

## 2019-10-20 DIAGNOSIS — C3490 Malignant neoplasm of unspecified part of unspecified bronchus or lung: Secondary | ICD-10-CM

## 2019-10-20 NOTE — Progress Notes (Signed)
Karen Barrera [address]       [place] [id number]             [phone]      Kyrstal Moore [facility] Medical Record #[id number] Date of Birth: [birth year]  Referring: Deneise Lever, MD Primary Care: [name], MD Primary Cardiologist: Shelva Majestic, MD   Chief Complaint:   POST OP FOLLOW UP 09/28/2018 PREOPERATIVE DIAGNOSIS:  Left upper lobe lung mass. POSTOPERATIVE DIAGNOSIS:  Left upper lobe lung mass, nonsmall-cell lung cancer by frozen section. PROCEDURE PERFORMED:  Bronchoscopy, left video-assisted thoracoscopy, wedge resection, left upper lobe, completion left upper lobectomy with lymph node dissection and intercostal nerve blocks. SURGEON:  Lanelle Bal, MD  History of Present Illness:      Patient doing well now approximately 1 year after resection of left upper lobe lung mass stage Ia 2 non-small cell carcinoma of the lung-currently under observation only.  Patient notes she has not had a cigarette since the day before surgery.  She has avoided Covid infection.  She notes she is getting her second vaccination this week.   She notes she has had low chronic back pain but denies any upper thoracic spine or neck pain    Cancer Staging Lung cancer [facility]) Staging form: Lung, AJCC 8th Edition - Pathologic stage from 09/29/2018: Stage IA2 (pT1b, pN0, cM0) - Signed by Grace Isaac, MD on 09/29/2018    Past Medical History:  Diagnosis Date  . Acute perforated appendicitis 10/31/2014  . Adenomatous colon polyp   . Allergic rhinitis   . Allergy   . Anemia   . Arthritis    "fingers" (10/31/2014)  . Chronic bronchitis ([place])    "got it q yr til I had my nose OR" (10/31/2014)  . COPD (chronic obstructive pulmonary disease) ([place])   . Coronary artery disease   . DDD (degenerative disc disease), cervical   . DDD (degenerative disc disease), lumbar   . Depression    "short term when my sister passed away unexpectedly"  . Family history of adverse  reaction to anesthesia    "mother is hard to wake up; a little goes a long way w/her"  . Family history of breast cancer   . Family history of colon cancer   . Family history of skin cancer   . GERD (gastroesophageal reflux disease)   . History of stomach ulcers   . Hyperlipidemia    "borderline; RX is preventative" (10/31/2014)  . Kidney stones    "they passed"  . Lung cancer ([place]) 09/28/2018  . Migraine    "used to get them alot; don't get them anymore" (10/31/2014)  . Osteoarthritis of both knees   . Osteopenia   . Pneumonia 2003 X 1  . Pre-diabetes   . Seizures ([place]) ~ 1962 X 1   "from an abscessed wisdom tooth"  . Tobacco dependence   . Vitamin D deficiency      Social History   Tobacco Use  Smoking Status Former Smoker  . Packs/day: 1.00  . Years: 50.00  . Pack years: 50.00  . Types: Cigarettes  . Quit date: 09/27/2018  . Years since quitting: 1.0  Smokeless Tobacco Never Used  Tobacco Comment   Quit 09/27/2018    Social History   Substance and Sexual Activity  Alcohol Use No  . Alcohol/week: 0.0 standard drinks     Allergies  Allergen Reactions  . Chantix [Varenicline] Anaphylaxis and Swelling    Makes  throat swell up  . Moxifloxacin Hcl In Nacl Other (See Comments)    aches    Current Outpatient Medications  Medication Sig Dispense Refill  . albuterol (PROVENTIL) (2.5 MG/3ML) 0.083% nebulizer solution Take 3 mLs (2.5 mg total) by nebulization every 6 (six) hours as needed for wheezing or shortness of breath. 75 mL 12  . albuterol (VENTOLIN HFA) 108 (90 Base) MCG/ACT inhaler Use 2 inhalations 15 minutes apart every 4 hours if needed to Rescue Asthma Attack 48 g 3  . aspirin EC 81 MG tablet Take 81 mg by mouth daily.    . cetirizine (ZYRTEC) 10 MG tablet Take 10 mg by mouth daily.    . Cholecalciferol (VITAMIN D-3) 125 MCG (5000 UT) TABS Take 10,000 Units by mouth daily. Please discuss dosage with medical doctor as is a high dose 30 tablet 0  . Coenzyme Q10  (COQ10) 200 MG CAPS Take 200 mg by mouth daily.    . diphenhydrAMINE (BENADRYL) 25 MG tablet Take 25 mg by mouth daily.    Marland Kitchen estradiol (ESTRACE) 1 MG tablet Take 1 mg by mouth daily.  3  . ibuprofen (ADVIL,MOTRIN) 200 MG tablet Take 600 mg by mouth every 8 (eight) hours as needed for headache or moderate pain.     . metoprolol succinate (TOPROL-XL) 25 MG 24 hr tablet TAKE 1 TABLET BY MOUTH EVERY DAY 90 tablet 3  . montelukast (SINGULAIR) 10 MG tablet Take 1 tablet Daily for Allergies 90 tablet 3  . omeprazole (PRILOSEC) 40 MG capsule Take 1 capsule 2 x  /day with Bkfst & Supper for Heartburn & Reflux 180 capsule 3  . rosuvastatin (CRESTOR) 40 MG tablet TAKE ONE TABLET BY MOUTH DAILY 90 tablet 0  . sertraline (ZOLOFT) 100 MG tablet Take 1 tablet daily for Mood 90 tablet 3  . SHINGRIX injection     . umeclidinium-vilanterol (ANORO ELLIPTA) 62.5-25 MCG/INH AEPB USE 1 INHALATION ORALLY    DAILY 180 each 3   No current facility-administered medications for this visit.       Physical Exam: BP 103/65   Pulse 76   Temp (!) 96.8 F (36 C) (Skin)   Resp 20   Ht 5\' 4"  (1.626 m)   Wt 90.3 kg   SpO2 94% Comment: RA  BMI 34.16 kg/m  General appearance: alert, cooperative and no distress Head: Normocephalic, without obvious abnormality, atraumatic Neck: no adenopathy, no carotid bruit, no JVD, supple, symmetrical, trachea midline and thyroid not enlarged, symmetric, no tenderness/mass/nodules Lymph nodes: Cervical, supraclavicular, and axillary nodes normal. Cardio: regular rate and rhythm, S1, S2 normal, no murmur, click, rub or gallop GI: soft, non-tender; bowel sounds normal; no masses,  no organomegaly Extremities: extremities normal, atraumatic, no cyanosis or edema Neurologic: Grossly normal   Diagnostic Studies & Laboratory data:     Recent Radiology Findings:   CT CHEST WO CONTRAST  Result Date: 10/20/2019 CLINICAL DATA:  History of adenocarcinoma left upper lobe post left upper  lobectomy 09/28/2018. EXAM: CT CHEST WITHOUT CONTRAST TECHNIQUE: Multidetector CT imaging of the chest was performed following the standard protocol without IV contrast. COMPARISON:  04/14/2019, 08/17/2018 and PET-CT 08/31/2018 FINDINGS: Cardiovascular: Heart is normal size. Calcified plaque over the left main and 3 vessel coronary arteries. Mild calcified plaque over the thoracic aorta without evidence of aneurysm. Remaining vascular structures are unremarkable. Mediastinum/Nodes: No mediastinal or hilar adenopathy. Remaining mediastinal structures are unremarkable. Lungs/Pleura: Lungs are adequately inflated without consolidation or effusion. Evidence of patient's previous left upper  lobectomy with surgical clips over the left hilar region. 3-4 mm nodular density along the left major fissure (image 45 series 14) anteriorly unchanged. 2 mm subpleural nodular density over the anterolateral left lower lobe (image 65 series 14) unchanged. 2 mm nodular density over the lingula (image 53 series 14) unchanged. Right lung is clear. Airways are unremarkable. Upper Abdomen: Moderate attic steatosis. Known 3.8 cm cyst over the posterior aspect of the right lobe of the liver slightly smaller. Known small left adrenal adenoma unchanged. Calcified plaque over the abdominal aorta which is normal in caliber. Musculoskeletal: Mild degenerative change of the spine. Moderate stable sclerosis involving the C6 and C7 vertebral bodies as well as the right pedicle/lamina and transverse process of T1 not significantly changed from previous CT. There was no significant hypermetabolic uptake on patient's previous PET-CT in these areas. Involvement of the right pedicle and transverse process of T1 is still suspicious for metastatic disease. IMPRESSION: 1. Postsurgical change compatible previous left upper lobectomy for lung cancer. Three tiny stable nodule opacities in the left lung as described likely benign. Recommend attention on  follow-up. No adenopathy. 2. Diffuse sclerosis of the C6 and C7 vertebral bodies without significant change with sclerosis over the right pedicle/lamina and transverse process of T1. These areas were not hypermetabolic on previous PET-CT, although metastatic disease is still possible. Consider bone scan for further evaluation. 3. Aortic Atherosclerosis (ICD10-I70.0). Atherosclerotic coronary artery disease. 4.  3.8 cm right liver cyst.  Moderate attic steatosis. 5.  Small stable left adrenal adenoma. Electronically Signed   By: Marin Olp M.D.   On: 10/20/2019 13:14    I have independently reviewed the above radiology studies  and reviewed the findings with the patient.    Recent Lab Findings: Lab Results  Component Value Date   WBC 6.7 08/02/2019   HGB 13.0 08/02/2019   HCT 40.0 08/02/2019   PLT 202 08/02/2019   GLUCOSE 83 08/02/2019   CHOL 151 08/02/2019   TRIG 169 (H) 08/02/2019   HDL 59 08/02/2019   LDLCALC 67 08/02/2019   ALT 38 (H) 08/02/2019   AST 36 (H) 08/02/2019   NA 138 08/02/2019   K 4.1 08/02/2019   CL 105 08/02/2019   CREATININE 0.78 08/02/2019   BUN 17 08/02/2019   CO2 23 08/02/2019   TSH 2.09 08/02/2019   INR 1.1 09/24/2018   HGBA1C 6.2 (H) 08/02/2019      Assessment / Plan:    #1 status post left upper lobectomy for stage I non-small cell lung cancer-CT scan done today shows no evidence of recurrence.  She has no symptoms of neck or upper chest back pain to suggest metastatic disease.  #2 patient is now a non-smoker for 1 year #3 follow-up Covid vaccination later this week      Grace Isaac MD      [address] [place] [address] Office [phone]   Beeper [phone]  10/20/2019 2:45 PM

## 2019-10-22 ENCOUNTER — Ambulatory Visit: Payer: Medicare Other | Attending: Internal Medicine

## 2019-10-22 DIAGNOSIS — Z23 Encounter for immunization: Secondary | ICD-10-CM

## 2019-10-22 NOTE — Progress Notes (Signed)
   Covid-19 Vaccination Clinic  Name:  Karen Barrera [facility]    MRN: [id number] DOB: 06/17/1951  10/22/2019  Karen Barrera was observed post Covid-19 immunization for 30 minutes based on pre-vaccination screening without incident. She was provided with Vaccine Information Sheet and instruction to access the V-Safe system.   Karen Barrera was instructed to call 911 with any severe reactions post vaccine: Marland Kitchen Difficulty breathing  . Swelling of face and throat  . A fast heartbeat  . A bad rash all over body  . Dizziness and weakness   Immunizations Administered    Name Date Dose VIS Date Route   Pfizer COVID-19 Vaccine 10/22/2019  2:22 PM 0.3 mL 07/08/2019 Intramuscular   Manufacturer: [place]   Lot: [id number]   [place]: [id number]

## 2019-11-01 ENCOUNTER — Ambulatory Visit: Payer: Medicare Other

## 2019-11-27 ENCOUNTER — Other Ambulatory Visit: Payer: Self-pay | Admitting: Physician Assistant

## 2019-11-28 DIAGNOSIS — D3617 Benign neoplasm of peripheral nerves and autonomic nervous system of trunk, unspecified: Secondary | ICD-10-CM | POA: Diagnosis not present

## 2019-11-28 DIAGNOSIS — L812 Freckles: Secondary | ICD-10-CM | POA: Diagnosis not present

## 2019-11-28 DIAGNOSIS — L820 Inflamed seborrheic keratosis: Secondary | ICD-10-CM | POA: Diagnosis not present

## 2019-11-28 DIAGNOSIS — D1801 Hemangioma of skin and subcutaneous tissue: Secondary | ICD-10-CM | POA: Diagnosis not present

## 2019-11-28 DIAGNOSIS — D235 Other benign neoplasm of skin of trunk: Secondary | ICD-10-CM | POA: Diagnosis not present

## 2019-11-28 DIAGNOSIS — D2271 Melanocytic nevi of right lower limb, including hip: Secondary | ICD-10-CM | POA: Diagnosis not present

## 2019-11-28 DIAGNOSIS — D2371 Other benign neoplasm of skin of right lower limb, including hip: Secondary | ICD-10-CM | POA: Diagnosis not present

## 2019-11-28 DIAGNOSIS — L570 Actinic keratosis: Secondary | ICD-10-CM | POA: Diagnosis not present

## 2019-11-28 DIAGNOSIS — L821 Other seborrheic keratosis: Secondary | ICD-10-CM | POA: Diagnosis not present

## 2019-11-28 DIAGNOSIS — D485 Neoplasm of uncertain behavior of skin: Secondary | ICD-10-CM | POA: Diagnosis not present

## 2019-11-28 DIAGNOSIS — B078 Other viral warts: Secondary | ICD-10-CM | POA: Diagnosis not present

## 2019-11-28 NOTE — Telephone Encounter (Signed)
Rx has been sent to the pharmacy electronically. ° °

## 2019-11-30 NOTE — Progress Notes (Signed)
Wellness AND 3 MONTH   Assessment:   wellness 1 year  Dysgeusia Stopping wellbutrin did not help Has seen dentist, surgeon Will stop anoro and given 2 month sampels of Breztri to see if this helps -     SARS-CoV-2 Antibody(IgG)Spike,Semi-Quantitative -     Budeson-Glycopyrrol-Formoterol (BREZTRI AEROSPHERE) 160-9-4.8 MCG/ACT AERO; Inhale 1 Pump into the lungs in the morning and at bedtime.  Tremor No visible tremor in the office, no cogwheeling, no imbalance Some family of essential tremor, given information If any other symptoms with the dysgeusia may refer to neuro/get MRI Normal neuro  Aortic atherosclerosis (HCC) Control blood pressure, cholesterol, glucose, increase exercise.   COPD mixed type ([place]) No triggers, well controlled symptoms, cont to monitor  Malignant neoplasm of upper lobe of left lung ([place]) S/p lobectomy, continue follow up  Recurrent major depressive disorder, in full remission ([place]) -     TSH  Mixed hyperlipidemia -     Lipid panel check lipids decrease fatty foods increase activity.   Anemia, unspecified type -     CBC with Differential/Platelet -     Iron,Total/Total Iron Binding Cap -     Ferritin - monitor, continue iron supp with Vitamin C and increase green leafy veggies  Vitamin D deficiency Continue supplement  Medication management -     CBC with Differential/Platelet -     COMPLETE METABOLIC PANEL WITH GFR -     Magnesium  Class 1 obesity due to excess calories with serious comorbidity and body mass index (BMI) of 30.0 to 30.9 in adult - follow up 3 months for progress monitoring - increase veggies, decrease carbs - long discussion about weight loss, diet, and exercise  Fatty liver -     COMPLETE METABOLIC PANEL WITH GFR Weight loss advised, will monitor LFTs  Agatston coronary artery calcium score greater than 400 Control blood pressure, cholesterol, glucose, increase exercise.  Following with Dr. Claiborne Billings  Hot  flashes Continue meds  Hx of adenomatous colonic polyps UTD  Genetic testing Negative  Abnormal glucose -     Hemoglobin A1c Discussed disease progression and risks Discussed diet/exercise, weight management and risk modification   Over 40 minutes of exam, counseling, chart review and critical decision making was performed Future Appointments  Date Time Provider [place]  08/01/2020  2:00 PM Vicie Mutters, PA-C [facility] None  10/17/2020  9:00 AM Deneise Lever, MD [facility] None   MEDICARE WELLNESS OBJECTIVES: Physical activity: Current Exercise Habits: Home exercise routine, Type of exercise: walking, Time (Minutes): 30, Frequency (Times/Week): 7, Weekly Exercise (Minutes/Week): 210, Intensity: Moderate Cardiac risk factors: Cardiac Risk Factors include: advanced age (>71men, >72 women);dyslipidemia;hypertension;obesity (BMI >30kg/m2);sedentary lifestyle;smoking/ tobacco exposure(quit smoking 09/2018) Depression/mood screen:   Depression screen [facility] 2/9 12/01/2019  Decreased Interest 0  Down, Depressed, Hopeless 0  PHQ - 2 Score 0  Altered sleeping 0  Tired, decreased energy 0  Change in appetite 0  Feeling bad or failure about yourself  0  Trouble concentrating 0  Moving slowly or fidgety/restless 0  Suicidal thoughts 0  PHQ-9 Score 0  Difficult doing work/chores Not difficult at all    ADLs:  In your present state of health, do you have any difficulty performing the following activities: 12/01/2019 03/06/2019  Hearing? N N  Vision? N N  Difficulty concentrating or making decisions? Y N  Comment x 1 year short term -  Walking or climbing stairs? N N  Dressing or bathing? N N  Doing errands, shopping? N  N  Preparing Food and eating ? N -  Using the Toilet? N -  In the past six months, have you accidently leaked urine? N -  Do you have problems with loss of bowel control? N -  Managing your Medications? N -  Managing your Finances? N -  Housekeeping or  managing your Housekeeping? N -  Some recent data might be hidden     Cognitive Testing  Alert? Yes  Normal Appearance?Yes  Oriented to person? Yes  Place? Yes   Time? Yes  Recall of three objects?  Yes  Can perform simple calculations? Yes  Displays appropriate judgment?Yes  Can read the correct time from a watch face?Yes  EOL planning: Does Patient Have a Medical Advance Directive?: Yes Type of Advance Directive: Healthcare Power of Attorney, Living will Does patient want to make changes to medical advance directive?: No - Patient declined Copy of [place] in Chart?: No - copy requested    Medicare Attestation I have personally reviewed: The patient's medical and social history Their use of alcohol, tobacco or illicit drugs Their current medications and supplements The patient's functional ability including ADLs,fall risks, home safety risks, cognitive, and hearing and visual impairment Diet and physical activities Evidence for depression or mood disorders  The patient's weight, height, BMI, and visual acuity have been recorded in the chart.  I have made referrals, counseling, and provided education to the patient based on review of the above and I have provided the patient with a written personalized care plan for preventive services.     Subjective:  Karen Barrera is a 69 y.o. female who presents for Wellness and 3 month follow up    Her blood pressure has been controlled at home, today their BP is BP: 130/74 She does not workout, she is cleaning, being active inside but not doing any working out. She follows with Dr. Claiborne Billings.   She denies chest pain, shortness of breath, dizziness.   She has COPD, s/p left lobectomy for stage 2 non small cell carcinoma on 09/28/2018 with Dr. Servando Snare, she continues to have some left sided nerve pain. She has not smoked since March 3rd 2020. Very random dry cough but has improved, she is on the anoro and has  albuterol to take as needed.    She is on prilosec 40 mg daily, will still have 1-2 episodes of heart burn once a week.  She occ has reflux, has been taking ibuprofen for diffuse joint pain.  She has bilateral knee, elbow, hip, hand pain.  She states she has been unable to taste anything since her surgery, she can taste some sweet and sour, she can taste salty. She has seen her dentist, has seen Dr. Bennye Alm, and we stopped wellbutrin last visit to see if this helped (and hot flashes) but it did not.  She has been on anoro x 2 years.  She also states her husband has noticed her shaking with rest, not with intention, not with brushing teeth, feeding self. Grandmother/siblings with tremor.   She has had some restless legs since her surgery.  Lab Results  Component Value Date   POEUMPNT61 443 08/02/2019   Lab Results  Component Value Date   IRON 66 08/02/2019   TIBC 368 08/02/2019   FERRITIN 121 08/02/2019    BMI is Body mass index is 33.47 kg/m., she is working on diet and exercise. She does 4-5 diet soda a day, no fruit juice, no sweet tea,  no nuts. She does not eat breakfast. Wt Readings from Last 10 Encounters:  12/01/19 195 lb (88.5 kg)  10/20/19 199 lb (90.3 kg)  10/18/19 199 lb 12.8 oz (90.6 kg)  08/02/19 197 lb 3.2 oz (89.4 kg)  08/01/19 196 lb 6.4 oz (89.1 kg)  05/31/19 196 lb (88.9 kg)  04/14/19 196 lb (88.9 kg)  03/16/19 194 lb 9.6 oz (88.3 kg)  03/07/19 193 lb 3.2 oz (87.6 kg)  01/24/19 190 lb 6.4 oz (86.4 kg)   She does have history of stress incontinence.    She is on cholesterol medication and denies myalgias. Her cholesterol is at goal. The cholesterol last visit was:   Lab Results  Component Value Date   CHOL 151 08/02/2019   HDL 59 08/02/2019   LDLCALC 67 08/02/2019   TRIG 169 (H) 08/02/2019   CHOLHDL 2.6 08/02/2019   Last A1C  Lab Results  Component Value Date   HGBA1C 6.2 (H) 08/02/2019   Last GFR: Lab Results  Component Value Date   GFRNONAA 78  08/02/2019   Patient is on Vitamin D supplement.   Lab Results  Component Value Date   VD25OH 60 08/02/2019     She was on estrogen pills from GYN and lexapro for hot flashes/depression which is helping some, she is on low dose ASA.   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    Medication Review:  Current Outpatient Medications (Endocrine & Metabolic):  .  estradiol (ESTRACE) 1 MG tablet, Take 1 mg by mouth daily.  Current Outpatient Medications (Cardiovascular):  .  metoprolol succinate (TOPROL-XL) 25 MG 24 hr tablet, TAKE 1 TABLET BY MOUTH EVERY DAY .  rosuvastatin (CRESTOR) 40 MG tablet, TAKE ONE TABLET BY MOUTH DAILY  Current Outpatient Medications (Respiratory):  .  albuterol (PROVENTIL) (2.5 MG/3ML) 0.083% nebulizer solution, Take 3 mLs (2.5 mg total) by nebulization every 6 (six) hours as needed for wheezing or shortness of breath. Marland Kitchen  albuterol (VENTOLIN HFA) 108 (90 Base) MCG/ACT inhaler, Use 2 inhalations 15 minutes apart every 4 hours if needed to Rescue Asthma Attack .  cetirizine (ZYRTEC) 10 MG tablet, Take 10 mg by mouth daily. .  diphenhydrAMINE (BENADRYL) 25 MG tablet, Take 25 mg by mouth daily. .  montelukast (SINGULAIR) 10 MG tablet, Take 1 tablet Daily for Allergies .  Budeson-Glycopyrrol-Formoterol (BREZTRI AEROSPHERE) 160-9-4.8 MCG/ACT AERO, Inhale 1 Pump into the lungs in the morning and at bedtime.  Current Outpatient Medications (Analgesics):  .  aspirin EC 81 MG tablet, Take 81 mg by mouth daily. Marland Kitchen  ibuprofen (ADVIL,MOTRIN) 200 MG tablet, Take 600 mg by mouth every 8 (eight) hours as needed for headache or moderate pain.    Current Outpatient Medications (Other):  Marland Kitchen  Cholecalciferol (VITAMIN D-3) 125 MCG (5000 UT) TABS, Take 10,000 Units by mouth daily. Please discuss dosage with medical doctor as is a high dose .  Coenzyme Q10 (COQ10) 200 MG CAPS, Take 200 mg by mouth daily. Marland Kitchen  omeprazole (PRILOSEC)  40 MG capsule, Take 1 capsule 2 x  /day with Bkfst & Supper for Heartburn & Reflux .  sertraline (ZOLOFT) 100 MG tablet, Take 1 tablet daily for Mood .  SHINGRIX injection,    Allergies  Allergen Reactions  . Chantix [Varenicline] Anaphylaxis and Swelling    Makes throat swell up  . Moxifloxacin Hcl In Nacl Other (See Comments)    aches    Current  Problems (verified) Patient Active Problem List   Diagnosis Date Noted  . Lung cancer ([place]) 09/28/2018  . Agatston coronary artery calcium score greater than 400   . Fatty liver 08/29/2018  . Aortic atherosclerosis ([place]) 08/06/2017  . Genetic testing 04/24/2017  . Hot flashes 11/12/2016  . Obesity 11/12/2016  . Encounter for Medicare annual wellness exam 11/12/2016  . COPD mixed type ([place]) 06/05/2016  . Costochondritis 03/23/2015  . Hx of adenomatous colonic polyps 01/23/2015  . Medication management 11/21/2014  . Depression   . Hyperlipidemia   . Anemia   . Vitamin D deficiency   . Tobacco dependence   . Seasonal and perennial allergic rhinitis 07/03/2013    Screening Tests Immunization History  Administered Date(s) Administered  . Influenza Split 05/16/2013, 05/12/2014, 05/14/2015  . Influenza, High Dose Seasonal PF 05/15/2017, 05/07/2018, 03/29/2019  . Influenza, Seasonal, Injecte, Preservative Fre 05/13/2016  . PFIZER SARS-COV-2 Vaccination 10/01/2019, 10/22/2019  . Pneumococcal Conjugate-13 11/12/2016  . Pneumococcal Polysaccharide-23 06/14/2011  . Tdap 03/27/2010  . Zoster 04/14/2012   Tetanus: 2011 due in August Pneumovax:  2012 Prevnar 13: 2018 Flu vaccine: 2020 Zostavax: 2013 COVID vaccine completed Shingrix 2021 has gotten both  MGM: 03/2019   at GYN DEXA: 2019  At GYN Osteopenia will get at GYN PAP 2016 never abnormal, last one Colonoscopy: 2016, due in 5 years/2021 Dr. Carlean Purl PFT 08/2018 CT chest 03/2019 Cath 08/2018  Names of Other Physician/Practitioners you currently use: 1. [facility] here for primary care 2. Dr. Dorena Cookey, eye doctor, last visit 2020 3. Dr. Luretha Rued, dentist 2021 Patient Care Team: [name], MD as PCP - General (Internal Medicine) Troy Sine, MD as PCP - Cardiology (Cardiology) Izora Gala, MD as Consulting Physician (Otolaryngology) Delila Pereyra, MD as Consulting Physician (Gynecology) Gatha Mayer, MD as Consulting Physician (Gastroenterology) Maple Hudson, MD as Referring Physician (Surgery)  SURGICAL HISTORY She  has a past surgical history that includes Shoulder arthroscopy w/ rotator cuff repair (Left, 2012); Tendon repair (Left, 10/2009); Shoulder arthroscopy (Right, 2003); Salpingoophorectomy (Right, 1999); Sinus exploration (12/2013); Vaginal hysterectomy (1978); laparoscopic appendectomy (N/A, 10/31/2014); Appendectomy; Colonoscopy; Cyst excision (Right, 01/11/2016); Arthrotomy (Right, 01/11/2016); LEFT HEART CATH AND CORONARY ANGIOGRAPHY (N/A, 09/06/2018); Video bronchoscopy (N/A, 09/28/2018); and Video assisted thoracoscopy (vats)/wedge resection (Left, 09/28/2018). FAMILY HISTORY Her family history includes Arthritis in her paternal grandmother; Cancer in an other family member; Colon cancer in an other family member; Colon cancer (age of onset: 37) in some other family members; Colon polyps in her father; Diabetes in her mother; Hyperlipidemia in her maternal grandfather; Melanoma (age of onset: 49) in her paternal grandmother; Seizures in her sister; Skin cancer (age of onset: 29) in her mother; Stroke in her maternal grandmother. SOCIAL HISTORY She  reports that she quit smoking about 14 months ago. Her smoking use included cigarettes. She has a 50.00 pack-year smoking history. She has never used smokeless tobacco. She reports that she does not drink alcohol or use drugs.  Review of Systems  Constitutional: Negative.   HENT: Negative.  Negative for congestion and sinus pain.   Eyes: Negative.    Respiratory: Negative for cough, hemoptysis, sputum production, shortness of breath and wheezing.   Cardiovascular: Negative.  Negative for chest pain and leg swelling.  Gastrointestinal: Positive for heartburn. Negative for abdominal pain, blood in stool, constipation, diarrhea, melena, nausea and vomiting.  Genitourinary: Negative.   Musculoskeletal: Positive for joint pain. Negative for back pain, falls, myalgias and neck  pain.  Skin: Negative.  Negative for rash.  Neurological: Positive for tremors. Negative for dizziness, tingling and sensory change.  Endo/Heme/Allergies: Negative.   Psychiatric/Behavioral: Positive for memory loss (x 1 year). Negative for depression, hallucinations, substance abuse and suicidal ideas. The patient is not nervous/anxious and does not have insomnia.      Objective:     Today's Vitals   12/01/19 0901  BP: 130/74  Pulse: 90  Temp: 97.7 F (36.5 C)  SpO2: 97%  Weight: 195 lb (88.5 kg)  Height: 5\' 4"  (1.626 m)   Body mass index is 33.47 kg/m.  General appearance: alert, no distress, WD/WN, female HEENT: normocephalic, sclerae anicteric, TMs pearly, nares patent, no discharge or erythema, pharynx normal Oral cavity: MMM, no lesions Neck: supple, no lymphadenopathy, no thyromegaly, no masses Heart: RRR, normal S1, S2, 1/6 systolic murmur Lungs: CTA bilaterally, no wheezes, rhonchi, or rales Abdomen: +bs, soft, + epigastric tenderness, non distended, no masses, no hepatomegaly, no splenomegaly Musculoskeletal: nontender, no swelling, no obvious deformity Extremities: no edema, no cyanosis, no clubbing Pulses: 2+ symmetric, upper and lower extremities, normal cap refill Neurological: alert, oriented x 3, CN2-12 intact, strength normal upper extremities and lower extremities, sensation normal throughout, DTRs 2+ throughout, no cerebellar signs, gait normal Psychiatric: normal affect, behavior normal, pleasant    Vicie Mutters, PA-C   12/01/2019

## 2019-12-01 ENCOUNTER — Ambulatory Visit (INDEPENDENT_AMBULATORY_CARE_PROVIDER_SITE_OTHER): Payer: Medicare Other | Admitting: Physician Assistant

## 2019-12-01 ENCOUNTER — Other Ambulatory Visit: Payer: Self-pay

## 2019-12-01 ENCOUNTER — Encounter: Payer: Self-pay | Admitting: Physician Assistant

## 2019-12-01 VITALS — BP 130/74 | HR 90 | Temp 97.7°F | Ht 64.0 in | Wt 195.0 lb

## 2019-12-01 DIAGNOSIS — Z6833 Body mass index (BMI) 33.0-33.9, adult: Secondary | ICD-10-CM | POA: Diagnosis not present

## 2019-12-01 DIAGNOSIS — R232 Flushing: Secondary | ICD-10-CM | POA: Diagnosis not present

## 2019-12-01 DIAGNOSIS — F3342 Major depressive disorder, recurrent, in full remission: Secondary | ICD-10-CM | POA: Diagnosis not present

## 2019-12-01 DIAGNOSIS — R432 Parageusia: Secondary | ICD-10-CM

## 2019-12-01 DIAGNOSIS — E6609 Other obesity due to excess calories: Secondary | ICD-10-CM

## 2019-12-01 DIAGNOSIS — Z86010 Personal history of colonic polyps: Secondary | ICD-10-CM

## 2019-12-01 DIAGNOSIS — R6889 Other general symptoms and signs: Secondary | ICD-10-CM

## 2019-12-01 DIAGNOSIS — Z0000 Encounter for general adult medical examination without abnormal findings: Secondary | ICD-10-CM

## 2019-12-01 DIAGNOSIS — R7309 Other abnormal glucose: Secondary | ICD-10-CM | POA: Diagnosis not present

## 2019-12-01 DIAGNOSIS — Z1379 Encounter for other screening for genetic and chromosomal anomalies: Secondary | ICD-10-CM | POA: Diagnosis not present

## 2019-12-01 DIAGNOSIS — E66811 Obesity, class 1: Secondary | ICD-10-CM

## 2019-12-01 DIAGNOSIS — Z0001 Encounter for general adult medical examination with abnormal findings: Secondary | ICD-10-CM | POA: Diagnosis not present

## 2019-12-01 DIAGNOSIS — R931 Abnormal findings on diagnostic imaging of heart and coronary circulation: Secondary | ICD-10-CM

## 2019-12-01 DIAGNOSIS — Z79899 Other long term (current) drug therapy: Secondary | ICD-10-CM

## 2019-12-01 DIAGNOSIS — I700 Atherosclerosis of aorta: Secondary | ICD-10-CM

## 2019-12-01 DIAGNOSIS — R251 Tremor, unspecified: Secondary | ICD-10-CM

## 2019-12-01 DIAGNOSIS — C3490 Malignant neoplasm of unspecified part of unspecified bronchus or lung: Secondary | ICD-10-CM

## 2019-12-01 DIAGNOSIS — F17200 Nicotine dependence, unspecified, uncomplicated: Secondary | ICD-10-CM

## 2019-12-01 DIAGNOSIS — D649 Anemia, unspecified: Secondary | ICD-10-CM

## 2019-12-01 DIAGNOSIS — M940 Chondrocostal junction syndrome [Tietze]: Secondary | ICD-10-CM

## 2019-12-01 DIAGNOSIS — E782 Mixed hyperlipidemia: Secondary | ICD-10-CM

## 2019-12-01 DIAGNOSIS — K760 Fatty (change of) liver, not elsewhere classified: Secondary | ICD-10-CM

## 2019-12-01 DIAGNOSIS — J3089 Other allergic rhinitis: Secondary | ICD-10-CM

## 2019-12-01 DIAGNOSIS — J449 Chronic obstructive pulmonary disease, unspecified: Secondary | ICD-10-CM

## 2019-12-01 DIAGNOSIS — J302 Other seasonal allergic rhinitis: Secondary | ICD-10-CM

## 2019-12-01 DIAGNOSIS — E559 Vitamin D deficiency, unspecified: Secondary | ICD-10-CM | POA: Diagnosis not present

## 2019-12-01 DIAGNOSIS — Z860101 Personal history of adenomatous and serrated colon polyps: Secondary | ICD-10-CM

## 2019-12-01 MED ORDER — BREZTRI AEROSPHERE 160-9-4.8 MCG/ACT IN AERO: 1.0000 | INHALATION_SPRAY | Freq: Two times a day (BID) | RESPIRATORY_TRACT | 1 refills | Status: DC

## 2019-12-01 NOTE — Patient Instructions (Addendum)
Stop the Anoro, start the Breztri- do 1 puff twice See if this helps your taste buds Can use TP roll as spacer to try to get more in the lung and less in the mouth  She was informed to call 911 if she develop any new symptoms such as worsening headaches, episodes of blurred vision, double vision or complete loss of vision or speech difficulties or motor weakness.  If you start to have more resting tremors, more memory issues, slowing down more, or imbalance, let us know and we will likely refer you to neuro and or get an MRI.   Essential Tremor A tremor is trembling or shaking that a person cannot control. Most tremors affect the hands or arms. Tremors can also affect the head, vocal cords, legs, and other parts of the body. Essential tremor is a tremor without a known cause. Usually, it occurs while a person is trying to perform an action. It tends to get worse gradually as a person ages. What are the causes? The cause of this condition is not known. What increases the risk? You are more likely to develop this condition if:  You have a family member with essential tremor.  You are age 69 or older.  You take certain medicines. What are the signs or symptoms? The main sign of a tremor is a rhythmic shaking of certain parts of your body that is uncontrolled and unintentional. You may:  Have difficulty eating with a spoon or fork.  Have difficulty writing.  Nod your head up and down or side to side.  Have a quivering voice. The shaking may:  Get worse over time.  Come and go.  Be more noticeable on one side of your body.  Get worse due to stress, fatigue, caffeine, and extreme heat or cold. How is this diagnosed? This condition may be diagnosed based on:  Your symptoms and medical history.  A physical exam. There is no single test to diagnose an essential tremor. However, your health care provider may order tests to rule out other causes of your condition. These may  include:  Blood and urine tests.  Imaging studies of your brain, such as CT scan and MRI.  A test that measures involuntary muscle movement (electromyogram). How is this treated? Treatment for essential tremor depends on the severity of the condition.  Some tremors may go away without treatment.  Mild tremors may not need treatment if they do not affect your day-to-day life.  Severe tremors may need to be treated using one or more of the following options: ? Medicines. ? Lifestyle changes. ? Occupational or physical therapy. Follow these instructions at home: Lifestyle   Do not use any products that contain nicotine or tobacco, such as cigarettes and e-cigarettes. If you need help quitting, ask your health care provider.  Limit your caffeine intake as told by your health care provider.  Try to get 8 hours of sleep each night.  Find ways to manage your stress that fits your lifestyle and personality. Consider trying meditation or yoga.  Try to anticipate stressful situations and allow extra time to manage them.  If you are struggling emotionally with the effects of your tremor, consider working with a mental health provider. General instructions  Take over-the-counter and prescription medicines only as told by your health care provider.  Avoid extreme heat and extreme cold.  Keep all follow-up visits as told by your health care provider. This is important. Visits may include physical therapy visits.  Contact a health care provider if:  You experience any changes in the location or intensity of your tremors.  You start having a tremor after starting a new medicine.  You have tremor with other symptoms, such as: ? Numbness. ? Tingling. ? Pain. ? Weakness.  Your tremor gets worse.  Your tremor interferes with your daily life.  You feel down, blue, or sad for at least 2 weeks in a row.  Worrying about your tremor and what other people think about you interferes  with your everyday life functions, including relationships, work, or school. Summary  Essential tremor is a tremor without a known cause. Usually, it occurs when you are trying to perform an action.  The cause of this condition is not known.  The main sign of a tremor is a rhythmic shaking of certain parts of your body that is uncontrolled and unintentional.  Treatment for essential tremor depends on the severity of the condition. This information is not intended to replace advice given to you by your health care provider. Make sure you discuss any questions you have with your health care provider. Document Revised: 07/24/2017 Document Reviewed: 07/24/2017 Elsevier Patient Education  2020 [employer].

## 2019-12-02 ENCOUNTER — Other Ambulatory Visit: Payer: Self-pay | Admitting: Adult Health

## 2019-12-02 DIAGNOSIS — F3342 Major depressive disorder, recurrent, in full remission: Secondary | ICD-10-CM

## 2019-12-02 LAB — COMPLETE METABOLIC PANEL WITH GFR
AG Ratio: 1.8 (calc) (ref 1.0–2.5)
ALT: 41 U/L — ABNORMAL HIGH (ref 6–29)
AST: 34 U/L (ref 10–35)
Albumin: 4.2 g/dL (ref 3.6–5.1)
Alkaline phosphatase (APISO): 58 U/L (ref 37–153)
BUN: 20 mg/dL (ref 7–25)
CO2: 24 mmol/L (ref 20–32)
Calcium: 9.2 mg/dL (ref 8.6–10.4)
Chloride: 106 mmol/L (ref 98–110)
Creat: 0.77 mg/dL (ref 0.50–0.99)
GFR, Est African American: 92 mL/min/{1.73_m2} (ref >=60)
GFR, Est Non African American: 79 mL/min/{1.73_m2} (ref >=60)
Globulin: 2.4 g/dL (calc) (ref 1.9–3.7)
Glucose, Bld: 105 mg/dL — ABNORMAL HIGH (ref 65–99)
Potassium: 4.1 mmol/L (ref 3.5–5.3)
Sodium: 138 mmol/L (ref 135–146)
Total Bilirubin: 0.6 mg/dL (ref 0.2–1.2)
Total Protein: 6.6 g/dL (ref 6.1–8.1)

## 2019-12-02 LAB — CBC WITH DIFFERENTIAL/PLATELET
Absolute Monocytes: 410 cells/uL (ref 200–950)
Basophils Absolute: 70 cells/uL (ref 0–200)
Basophils Relative: 1.3 %
Eosinophils Absolute: 103 cells/uL (ref 15–500)
Eosinophils Relative: 1.9 %
HCT: 43.1 % (ref 35.0–45.0)
Hemoglobin: 13.8 g/dL (ref 11.7–15.5)
Lymphs Abs: 1172 cells/uL (ref 850–3900)
MCH: 28.8 pg (ref 27.0–33.0)
MCHC: 32 g/dL (ref 32.0–36.0)
MCV: 89.8 fL (ref 80.0–100.0)
MPV: 10.4 fL (ref 7.5–12.5)
Monocytes Relative: 7.6 %
Neutro Abs: 3645 cells/uL (ref 1500–7800)
Neutrophils Relative %: 67.5 %
Platelets: 180 10*3/uL (ref 140–400)
RBC: 4.8 10*6/uL (ref 3.80–5.10)
RDW: 13.4 % (ref 11.0–15.0)
Total Lymphocyte: 21.7 %
WBC: 5.4 10*3/uL (ref 3.8–10.8)

## 2019-12-02 LAB — MAGNESIUM: Magnesium: 2.1 mg/dL (ref 1.5–2.5)

## 2019-12-02 LAB — VITAMIN D 25 HYDROXY (VIT D DEFICIENCY, FRACTURES): Vit D, 25-Hydroxy: 58 ng/mL (ref 30–100)

## 2019-12-02 LAB — LIPID PANEL
Cholesterol: 139 mg/dL (ref <200)
HDL: 54 mg/dL (ref >=50)
LDL Cholesterol (Calc): 63 mg/dL (calc)
Non-HDL Cholesterol (Calc): 85 mg/dL (calc) (ref <130)
Total CHOL/HDL Ratio: 2.6 (calc) (ref <5.0)
Triglycerides: 134 mg/dL (ref <150)

## 2019-12-02 LAB — SARS-COV-2 ANTIBODY(IGG)SPIKE,SEMI-QUANTITATIVE: SARS COV1 AB(IGG)SPIKE,SEMI QN: >20 index — ABNORMAL HIGH (ref <1.00)

## 2019-12-02 LAB — HEMOGLOBIN A1C
Hgb A1c MFr Bld: 6 % of total Hgb — ABNORMAL HIGH (ref <5.7)
Mean Plasma Glucose: 126 (calc)
eAG (mmol/L): 7 (calc)

## 2019-12-02 LAB — TSH: TSH: 2.01 mIU/L (ref 0.40–4.50)

## 2020-02-07 ENCOUNTER — Other Ambulatory Visit: Payer: Self-pay | Admitting: Internal Medicine

## 2020-02-07 ENCOUNTER — Other Ambulatory Visit: Payer: Self-pay | Admitting: Orthopedic Surgery

## 2020-02-16 ENCOUNTER — Other Ambulatory Visit: Payer: Self-pay

## 2020-02-16 ENCOUNTER — Encounter (HOSPITAL_BASED_OUTPATIENT_CLINIC_OR_DEPARTMENT_OTHER): Payer: Self-pay | Admitting: Orthopedic Surgery

## 2020-02-16 NOTE — Progress Notes (Signed)
Called and LM with Alecia at Dr. Carita Pian office for hold time on asa before surgery. Patient has known CAD and was started on asa by cardiology.

## 2020-02-17 NOTE — Progress Notes (Signed)

## 2020-02-18 ENCOUNTER — Other Ambulatory Visit (HOSPITAL_COMMUNITY)
Admission: RE | Admit: 2020-02-18 | Discharge: 2020-02-18 | Disposition: A | Discharge status: Home / Self Care | Payer: Medicare Other | Source: Ambulatory Visit | Attending: Orthopedic Surgery | Admitting: Orthopedic Surgery

## 2020-02-18 DIAGNOSIS — Z01812 Encounter for preprocedural laboratory examination: Secondary | ICD-10-CM | POA: Insufficient documentation

## 2020-02-18 DIAGNOSIS — Z20822 Contact with and (suspected) exposure to covid-19: Secondary | ICD-10-CM | POA: Diagnosis not present

## 2020-02-18 LAB — SARS CORONAVIRUS 2 (TAT 6-24 HRS): SARS Coronavirus 2: NEGATIVE

## 2020-02-20 ENCOUNTER — Encounter (HOSPITAL_BASED_OUTPATIENT_CLINIC_OR_DEPARTMENT_OTHER): Payer: Self-pay | Admitting: Orthopedic Surgery

## 2020-02-21 ENCOUNTER — Ambulatory Visit (HOSPITAL_BASED_OUTPATIENT_CLINIC_OR_DEPARTMENT_OTHER): Payer: Medicare Other | Admitting: Certified Registered"

## 2020-02-21 ENCOUNTER — Encounter (HOSPITAL_BASED_OUTPATIENT_CLINIC_OR_DEPARTMENT_OTHER): Payer: Self-pay | Admitting: Orthopedic Surgery

## 2020-02-21 ENCOUNTER — Ambulatory Visit (HOSPITAL_BASED_OUTPATIENT_CLINIC_OR_DEPARTMENT_OTHER)
Admission: RE | Admit: 2020-02-21 | Discharge: 2020-02-21 | Disposition: A | Discharge status: Home / Self Care | Payer: Medicare Other | Attending: Orthopedic Surgery | Admitting: Orthopedic Surgery

## 2020-02-21 ENCOUNTER — Other Ambulatory Visit: Payer: Self-pay

## 2020-02-21 ENCOUNTER — Encounter (HOSPITAL_BASED_OUTPATIENT_CLINIC_OR_DEPARTMENT_OTHER)
Admission: RE | Disposition: A | Discharge status: Home / Self Care | Payer: Self-pay | Source: Home / Self Care | Attending: Orthopedic Surgery

## 2020-02-21 DIAGNOSIS — Z7982 Long term (current) use of aspirin: Secondary | ICD-10-CM | POA: Insufficient documentation

## 2020-02-21 DIAGNOSIS — M67431 Ganglion, right wrist: Secondary | ICD-10-CM | POA: Diagnosis not present

## 2020-02-21 DIAGNOSIS — Z90710 Acquired absence of both cervix and uterus: Secondary | ICD-10-CM | POA: Diagnosis not present

## 2020-02-21 DIAGNOSIS — K219 Gastro-esophageal reflux disease without esophagitis: Secondary | ICD-10-CM | POA: Insufficient documentation

## 2020-02-21 DIAGNOSIS — J449 Chronic obstructive pulmonary disease, unspecified: Secondary | ICD-10-CM | POA: Diagnosis not present

## 2020-02-21 DIAGNOSIS — Z8261 Family history of arthritis: Secondary | ICD-10-CM | POA: Insufficient documentation

## 2020-02-21 DIAGNOSIS — M8580 Other specified disorders of bone density and structure, unspecified site: Secondary | ICD-10-CM | POA: Diagnosis present

## 2020-02-21 DIAGNOSIS — Z90721 Acquired absence of ovaries, unilateral: Secondary | ICD-10-CM | POA: Diagnosis not present

## 2020-02-21 DIAGNOSIS — Z87891 Personal history of nicotine dependence: Secondary | ICD-10-CM | POA: Diagnosis not present

## 2020-02-21 DIAGNOSIS — Z881 Allergy status to other antibiotic agents status: Secondary | ICD-10-CM | POA: Diagnosis not present

## 2020-02-21 DIAGNOSIS — I2510 Atherosclerotic heart disease of native coronary artery without angina pectoris: Secondary | ICD-10-CM | POA: Insufficient documentation

## 2020-02-21 DIAGNOSIS — Z79899 Other long term (current) drug therapy: Secondary | ICD-10-CM | POA: Diagnosis not present

## 2020-02-21 DIAGNOSIS — E559 Vitamin D deficiency, unspecified: Secondary | ICD-10-CM | POA: Diagnosis not present

## 2020-02-21 DIAGNOSIS — Z902 Acquired absence of lung [part of]: Secondary | ICD-10-CM | POA: Diagnosis not present

## 2020-02-21 DIAGNOSIS — E785 Hyperlipidemia, unspecified: Secondary | ICD-10-CM | POA: Diagnosis not present

## 2020-02-21 DIAGNOSIS — Z888 Allergy status to other drugs, medicaments and biological substances status: Secondary | ICD-10-CM | POA: Insufficient documentation

## 2020-02-21 DIAGNOSIS — C3490 Malignant neoplasm of unspecified part of unspecified bronchus or lung: Secondary | ICD-10-CM | POA: Diagnosis not present

## 2020-02-21 DIAGNOSIS — R7303 Prediabetes: Secondary | ICD-10-CM | POA: Insufficient documentation

## 2020-02-21 DIAGNOSIS — Z8669 Personal history of other diseases of the nervous system and sense organs: Secondary | ICD-10-CM | POA: Insufficient documentation

## 2020-02-21 DIAGNOSIS — M170 Bilateral primary osteoarthritis of knee: Secondary | ICD-10-CM | POA: Diagnosis not present

## 2020-02-21 DIAGNOSIS — Z833 Family history of diabetes mellitus: Secondary | ICD-10-CM | POA: Insufficient documentation

## 2020-02-21 HISTORY — PX: GANGLION CYST EXCISION: SHX1691

## 2020-02-21 SURGERY — EXCISION, GANGLION CYST, WRIST
Anesthesia: Monitor Anesthesia Care | Site: Wrist | Laterality: Right

## 2020-02-21 MED ORDER — CEFAZOLIN SODIUM-DEXTROSE 2-4 GM/100ML-% IV SOLN
2.0000 g | INTRAVENOUS | Status: AC
  Administered 2020-02-21: 2 g via INTRAVENOUS

## 2020-02-21 MED ORDER — PROPOFOL 500 MG/50ML IV EMUL
INTRAVENOUS | Status: DC | PRN
Start: 2020-02-21 — End: 2020-02-21
  Administered 2020-02-21: 50 ug/kg/min via INTRAVENOUS

## 2020-02-21 MED ORDER — BUPIVACAINE HCL (PF) 0.25 % IJ SOLN
INTRAMUSCULAR | Status: DC | PRN
  Administered 2020-02-21: 6 mL

## 2020-02-21 MED ORDER — LACTATED RINGERS IV SOLN: INTRAVENOUS | Status: DC

## 2020-02-21 MED ORDER — OXYCODONE HCL 5 MG PO TABS: 5.0000 mg | ORAL_TABLET | Freq: Once | ORAL | Status: DC | PRN

## 2020-02-21 MED ORDER — HYDROCODONE-ACETAMINOPHEN 5-325 MG PO TABS: ORAL_TABLET | ORAL | 0 refills | Status: DC

## 2020-02-21 MED ORDER — FENTANYL CITRATE (PF) 100 MCG/2ML IJ SOLN
INTRAMUSCULAR | Status: AC
  Filled 2020-02-21: qty 2

## 2020-02-21 MED ORDER — FENTANYL CITRATE (PF) 100 MCG/2ML IJ SOLN
INTRAMUSCULAR | Status: DC | PRN
  Administered 2020-02-21: 50 ug via INTRAVENOUS

## 2020-02-21 MED ORDER — ACETAMINOPHEN 160 MG/5ML PO SOLN: 1000.0000 mg | Freq: Once | ORAL | Status: DC | PRN

## 2020-02-21 MED ORDER — CEFAZOLIN SODIUM-DEXTROSE 2-4 GM/100ML-% IV SOLN
INTRAVENOUS | Status: AC
  Filled 2020-02-21: qty 100

## 2020-02-21 MED ORDER — ACETAMINOPHEN 500 MG PO TABS: 1000.0000 mg | ORAL_TABLET | Freq: Once | ORAL | Status: DC | PRN

## 2020-02-21 MED ORDER — ACETAMINOPHEN 10 MG/ML IV SOLN: 1000.0000 mg | Freq: Once | INTRAVENOUS | Status: DC | PRN

## 2020-02-21 MED ORDER — OXYCODONE HCL 5 MG/5ML PO SOLN: 5.0000 mg | Freq: Once | ORAL | Status: DC | PRN

## 2020-02-21 MED ORDER — LIDOCAINE HCL (PF) 0.5 % IJ SOLN
INTRAMUSCULAR | Status: DC | PRN
Start: 2020-02-21 — End: 2020-02-21
  Administered 2020-02-21: 45 mL via INTRAVENOUS

## 2020-02-21 MED ORDER — FENTANYL CITRATE (PF) 100 MCG/2ML IJ SOLN: 25.0000 ug | INTRAMUSCULAR | Status: DC | PRN

## 2020-02-21 MED ORDER — ONDANSETRON HCL 4 MG/2ML IJ SOLN
INTRAMUSCULAR | Status: DC | PRN
Start: 2020-02-21 — End: 2020-02-21
  Administered 2020-02-21: 4 mg via INTRAVENOUS

## 2020-02-21 SURGICAL SUPPLY — 59 items
APL PRP STRL LF DISP 70% ISPRP (MISCELLANEOUS) ×2
APL SKNCLS STERI-STRIP NONHPOA (GAUZE/BANDAGES/DRESSINGS)
BENZOIN TINCTURE PRP APPL 2/3 (GAUZE/BANDAGES/DRESSINGS) IMPLANT
BLADE MINI RND TIP GREEN BEAV (BLADE) IMPLANT
BLADE SURG 15 STRL LF DISP TIS (BLADE) ×4 IMPLANT
BLADE SURG 15 STRL SS (BLADE) ×6
BNDG CMPR 9X4 STRL LF SNTH (GAUZE/BANDAGES/DRESSINGS)
BNDG COHESIVE 1X5 TAN STRL LF (GAUZE/BANDAGES/DRESSINGS) IMPLANT
BNDG COHESIVE 2X5 TAN STRL LF (GAUZE/BANDAGES/DRESSINGS) IMPLANT
BNDG CONFORM 2 STRL LF (GAUZE/BANDAGES/DRESSINGS) IMPLANT
BNDG ELASTIC 2X5.8 VLCR STR LF (GAUZE/BANDAGES/DRESSINGS) IMPLANT
BNDG ELASTIC 3X5.8 VLCR STR LF (GAUZE/BANDAGES/DRESSINGS) IMPLANT
BNDG ESMARK 4X9 LF (GAUZE/BANDAGES/DRESSINGS) IMPLANT
BNDG GAUZE 1X2.1 STRL (MISCELLANEOUS) IMPLANT
BNDG GAUZE ELAST 4 BULKY (GAUZE/BANDAGES/DRESSINGS) IMPLANT
BNDG PLASTER X FAST 3X3 WHT LF (CAST SUPPLIES) IMPLANT
BNDG PLSTR 9X3 FST ST WHT (CAST SUPPLIES)
CHLORAPREP W/TINT 26 (MISCELLANEOUS) ×3 IMPLANT
CORD BIPOLAR FORCEPS 12FT (ELECTRODE) ×3 IMPLANT
COVER BACK TABLE 60X90IN (DRAPES) ×3 IMPLANT
COVER MAYO STAND STRL (DRAPES) ×3 IMPLANT
COVER WAND RF STERILE (DRAPES) IMPLANT
CUFF TOURN SGL QUICK 18X4 (TOURNIQUET CUFF) ×3 IMPLANT
DRAPE EXTREMITY T 121X128X90 (DISPOSABLE) ×3 IMPLANT
DRAPE SURG 17X23 STRL (DRAPES) ×3 IMPLANT
GAUZE SPONGE 4X4 12PLY STRL (GAUZE/BANDAGES/DRESSINGS) ×3 IMPLANT
GAUZE XEROFORM 1X8 LF (GAUZE/BANDAGES/DRESSINGS) ×3 IMPLANT
GLOVE BIO SURGEON STRL SZ7.5 (GLOVE) ×3 IMPLANT
GLOVE BIOGEL PI IND STRL 7.0 (GLOVE) ×2 IMPLANT
GLOVE BIOGEL PI IND STRL 8 (GLOVE) ×2 IMPLANT
GLOVE BIOGEL PI IND STRL 8.5 (GLOVE) ×2 IMPLANT
GLOVE BIOGEL PI INDICATOR 7.0 (GLOVE) ×1
GLOVE BIOGEL PI INDICATOR 8 (GLOVE) ×1
GLOVE BIOGEL PI INDICATOR 8.5 (GLOVE) ×1
GLOVE ECLIPSE 6.5 STRL STRAW (GLOVE) ×3 IMPLANT
GLOVE SURG ORTHO 8.0 STRL STRW (GLOVE) ×3 IMPLANT
GOWN STRL REUS W/ TWL LRG LVL3 (GOWN DISPOSABLE) ×2 IMPLANT
GOWN STRL REUS W/TWL LRG LVL3 (GOWN DISPOSABLE) ×3
GOWN STRL REUS W/TWL XL LVL3 (GOWN DISPOSABLE) ×6 IMPLANT
NEEDLE HYPO 25X1 1.5 SAFETY (NEEDLE) ×3 IMPLANT
NS IRRIG 1000ML POUR BTL (IV SOLUTION) ×3 IMPLANT
PACK BASIN DAY SURGERY FS (CUSTOM PROCEDURE TRAY) ×3 IMPLANT
PAD CAST 3X4 CTTN HI CHSV (CAST SUPPLIES) IMPLANT
PAD CAST 4YDX4 CTTN HI CHSV (CAST SUPPLIES) IMPLANT
PADDING CAST ABS 4INX4YD NS (CAST SUPPLIES) ×1
PADDING CAST ABS COTTON 4X4 ST (CAST SUPPLIES) ×2 IMPLANT
PADDING CAST COTTON 3X4 STRL (CAST SUPPLIES)
PADDING CAST COTTON 4X4 STRL (CAST SUPPLIES)
STOCKINETTE 4X48 STRL (DRAPES) ×3 IMPLANT
STRIP CLOSURE SKIN 1/2X4 (GAUZE/BANDAGES/DRESSINGS) IMPLANT
SUT ETHILON 3 0 PS 1 (SUTURE) IMPLANT
SUT ETHILON 4 0 PS 2 18 (SUTURE) ×3 IMPLANT
SUT ETHILON 5 0 P 3 18 (SUTURE)
SUT NYLON ETHILON 5-0 P-3 1X18 (SUTURE) IMPLANT
SUT VIC AB 4-0 P2 18 (SUTURE) IMPLANT
SYR BULB EAR ULCER 3OZ GRN STR (SYRINGE) ×3 IMPLANT
SYR CONTROL 10ML LL (SYRINGE) ×3 IMPLANT
TOWEL GREEN STERILE FF (TOWEL DISPOSABLE) ×6 IMPLANT
UNDERPAD 30X36 HEAVY ABSORB (UNDERPADS AND DIAPERS) ×3 IMPLANT

## 2020-02-21 NOTE — Op Note (Signed)
I assisted Surgeon(s) and Role:    * Leanora Cover, MD - Primary    Daryll Brod, MD - Assisting on the Procedure(s): REMOVAL GANGLION OF WRIST on 02/21/2020.  I provided assistance on this case as follows: setup, isolation and removal of the cyst and release of the FCR tendon. Closure of the wound and a[pplication of the dressing. Electronically signed by: Daryll Brod, MD Date: 02/21/2020 Time: 2:51 PM

## 2020-02-21 NOTE — Op Note (Signed)
NAME: Karen Barrera [facility] MEDICAL RECORD NO: [id number] DATE OF BIRTH: [birth year] FACILITY: [facility] LOCATION: [facility] PHYSICIAN: Tennis Must, MD   OPERATIVE REPORT   DATE OF PROCEDURE: 02/21/20    PREOPERATIVE DIAGNOSIS:   Right wrist volar ganglion cyst   POSTOPERATIVE DIAGNOSIS:   Right wrist volar ganglion cyst from thumb CMC joint   PROCEDURE:   Excision of right wrist volar ganglion cyst and release of FCR tendon sheath   SURGEON:  Leanora Cover, M.D.   ASSISTANT: Daryll Brod, MD   ANESTHESIA:  Bier block with sedation   INTRAVENOUS FLUIDS:  Per anesthesia flow sheet.   ESTIMATED BLOOD LOSS:  Minimal.   COMPLICATIONS:  None.   SPECIMENS:   Right wrist ganglion to pathology   TOURNIQUET TIME:    Total Tourniquet Time Documented: Upper Arm (Right) - 32 minutes Total: Upper Arm (Right) - 32 minutes    DISPOSITION:  Stable to PACU.   INDICATIONS: 69 year old female has noted a mass on her right wrist.  This is bothersome and painful to her.  She wishes to have it removed. Risks, benefits and alternatives of surgery were discussed including the risks of blood loss, infection, damage to nerves, vessels, tendons, ligaments, bone for surgery, need for additional surgery, complications with wound healing, continued pain, stiffness.  She voiced understanding of these risks and elected to proceed.  OPERATIVE COURSE:  After being identified preoperatively by myself,  the patient and I agreed on the procedure and site of the procedure.  The surgical site was marked.  Surgical consent had been signed. She was given IV antibiotics as preoperative antibiotic prophylaxis. She was transferred to the operating room and placed on the operating table in supine position with the Right upper extremity on an arm board.  Bier block anesthesia was induced by the anesthesiologist.  Right upper extremity was prepped and draped in normal sterile orthopedic fashion.  A surgical  pause was performed between the surgeons, anesthesia, and operating room staff and all were in agreement as to the patient, procedure, and site of procedure.  Tourniquet at the proximal aspect of the arm had been inflated for the Bier block.    Incision was made over the mass volar aspect of the wrist.  This was carried in subcutaneous tissues by spreading technique.  The radial artery and superficial palmar branch radial artery were identified and protected throughout the case.  The mass was cystic in nature.  It was adherent to the FCR tendon.  It was carefully freed up.  It was tracking along the FCR tendon from distally.  It was traced distally.  The FCR tendon sheath was released.  The stalk was coming from the [facility] joint of the thumb.  This area was debrided.  The FCR tendon sheath was released to the level of the midportion of the trapezial ridge.  The cyst was sent to pathology for examination.  The wound was copiously irrigated with sterile saline.  Was closed with 4-0 nylon in a horizontal mattress fashion.  Was injected with quarter percent plain Marcaine in postoperative analgesia.  The tourniquet was deflated at 32 minutes.  Fingertips were pink with brisk capillary refill after deflation of tourniquet.  The operative  drapes were broken down.  The patient was awoken from anesthesia safely.  She was transferred back to the stretcher and taken to PACU in stable condition.  I will see her back in the office in 1 week for postoperative followup.  I will give her a prescription for Norco 5/325 1-2 tabs PO q6 hours prn pain, dispense # 20.   Leanora Cover, MD Electronically signed, 02/21/20

## 2020-02-21 NOTE — Anesthesia Preprocedure Evaluation (Addendum)
Anesthesia Evaluation  Patient identified by MRN, date of birth, ID band Patient awake    Reviewed: Allergy & Precautions, NPO status , Patient's Chart, lab work & pertinent test results  History of Anesthesia Complications (+) Family history of anesthesia reactionNegative for: history of anesthetic complications  Airway Mallampati: II  TM Distance: >3 FB Neck ROM: Full    Dental  (+) Dental Advisory Given   Pulmonary neg shortness of breath, neg sleep apnea, COPD,  COPD inhaler, neg recent URI, former smoker,    breath sounds clear to auscultation       Cardiovascular (-) angina+ CAD  (-) CHF (-) dysrhythmias  Rhythm:Regular  2020:   Prox RCA lesion is 30% stenosed.  Mid RCA lesion is 20% stenosed.  Prox Cx lesion is 20% stenosed.  Ost 1st Mrg lesion is 20% stenosed.  Ost LAD to Prox LAD lesion is 20% stenosed.  Prox LAD to Mid LAD lesion is 30% stenosed.  Mid LAD lesion is 55% stenosed.   There is evidence for moderate coronary calcification without high-grade obstructive CAD.  There is 20 to 30% proximal to mid LAD stenoses with 55% mid stenosis in the mid vessel beyond a small diagonal vessel; more significant calcification involving the proximal left circumflex coronary artery with 20% proximal narrowing and 20% narrowing in the OM1 vessel; and 30 and 20% proximal to mid RCA stenoses.  Normal LV function without focal segmental wall motion abnormalities.  LVEDP 18 mm; however, the patient's blood pressure was low   prior to the procedure and she had received a 250 cc normal saline bolus.  RECOMMENDATION: Initial medical therapy.  With multivessel coronary calcification, recommend high potency statin therapy and will titrate rosuvastatin to 40 mg.  Patient had been started on Toprol-XL 25 mg.  Depending upon blood pressure, additional medical therapy can be added as needed. Recommend aspirin 81 mg.  Smoking cessation is  essential.  2019:  - Left ventricle: The cavity size was normal. Wall thickness was  normal. Systolic function was normal. The estimated ejection  fraction was in the range of 55% to 60%. Wall motion was normal;  there were no regional wall motion abnormalities. Doppler  parameters are consistent with abnormal left ventricular  relaxation (grade 1 diastolic dysfunction).  - Aortic valve: There was no stenosis.  - Aorta: Mildly dilated aortic root. Aortic root dimension: 40 mm  (ED).  - Mitral valve: There was no significant regurgitation.  - Right ventricle: The cavity size was normal. Systolic function  was normal.  - Tricuspid valve: Peak RV-RA gradient (S): 17 mm Hg.  - Pulmonary arteries: PA peak pressure: 20 mm Hg (S).  - Inferior vena cava: The vessel was normal in size. The  respirophasic diameter changes were in the normal range (>= 50%),  consistent with normal central venous pressure.    Neuro/Psych  Headaches, Seizures -,  PSYCHIATRIC DISORDERS Depression    GI/Hepatic Neg liver ROS, GERD  Medicated and Controlled,  Endo/Other  negative endocrine ROS  Renal/GU Renal disease     Musculoskeletal  (+) Arthritis ,   Abdominal   Peds  Hematology negative hematology ROS (+)   Anesthesia Other Findings   Reproductive/Obstetrics                            Anesthesia Physical Anesthesia Plan  ASA: II  Anesthesia Plan: MAC and Bier Block and Bier Block-LIDOCAINE ONLY   Post-op Pain Management:  Induction:   PONV Risk Score and Plan: 2 and Treatment may vary due to age or medical condition and Propofol infusion  Airway Management Planned: Nasal Cannula  Additional Equipment:   Intra-op Plan:   Post-operative Plan:   Informed Consent: I have reviewed the patients History and Physical, chart, labs and discussed the procedure including the risks, benefits and alternatives for the proposed anesthesia with the  patient or authorized representative who has indicated his/her understanding and acceptance.     Dental advisory given  Plan Discussed with: CRNA and Surgeon  Anesthesia Plan Comments:         Anesthesia Quick Evaluation

## 2020-02-21 NOTE — Transfer of Care (Signed)
Immediate Anesthesia Transfer of Care Note  Patient: Karen Barrera  Procedure(s) Performed: REMOVAL GANGLION OF WRIST (Right Wrist)  Patient Location: PACU  Anesthesia Type:MAC and Bier block  Level of Consciousness: awake, alert  and oriented  Airway & Oxygen Therapy: Patient Spontanous Breathing and Patient connected to face mask oxygen  Post-op Assessment: Report given to RN and Post -op Vital signs reviewed and stable  Post vital signs: Reviewed and stable  Last Vitals:  Vitals Value Taken Time  BP    Temp    Pulse    Resp    SpO2      Last Pain:  Vitals:   02/21/20 1159  TempSrc: Oral  PainSc: 0-No pain         Complications: No complications documented.

## 2020-02-21 NOTE — Anesthesia Procedure Notes (Signed)
Anesthesia Regional Block: Bier block (IV Regional)   Pre-Anesthetic Checklist: ,, timeout performed, Correct Patient, Correct Site, Correct Laterality, Correct Procedure, Correct Position, site marked, Risks and benefits discussed,  Surgical consent,  Pre-op evaluation,  At surgeon's request and post-op pain management  Laterality: Right  Prep: alcohol swabs        Procedures:,,,,,,,, #20gu IV placed  Narrative:  CRNA: Verita Lamb, CRNA

## 2020-02-21 NOTE — Discharge Instructions (Addendum)

## 2020-02-21 NOTE — H&P (Signed)
Karen Barrera is an 69 y.o. female.   Chief Complaint: ganglion HPI: 69 yo female with mass on right wrist.  It is bothersome to her and she wishes to have it removed.  Allergies:  Allergies  Allergen Reactions  . Chantix [Varenicline] Anaphylaxis and Swelling    Makes throat swell up  . Moxifloxacin Hcl In Nacl Other (See Comments)    aches    Past Medical History:  Diagnosis Date  . Acute perforated appendicitis 10/31/2014  . Adenomatous colon polyp   . Allergic rhinitis   . Allergy   . Anemia   . Arthritis    "fingers" (10/31/2014)  . Chronic bronchitis ([place])    "got it q yr til I had my nose OR" (10/31/2014)  . COPD (chronic obstructive pulmonary disease) ([place])   . Coronary artery disease   . DDD (degenerative disc disease), cervical   . DDD (degenerative disc disease), lumbar   . Depression    "short term when my sister passed away unexpectedly"  . Family history of adverse reaction to anesthesia    "mother is hard to wake up; a little goes a long way w/her"  . Family history of breast cancer   . Family history of colon cancer   . Family history of skin cancer   . GERD (gastroesophageal reflux disease)   . History of stomach ulcers   . Hyperlipidemia    "borderline; RX is preventative" (10/31/2014)  . Kidney stones    "they passed"  . Lung cancer ([place]) 09/28/2018  . Migraine    "used to get them alot; don't get them anymore" (10/31/2014)  . Osteoarthritis of both knees   . Osteopenia   . Pneumonia 2003 X 1  . Pre-diabetes   . Seizures ([place]) ~ 1962 X 1   "from an abscessed wisdom tooth"  . Tobacco dependence   . Vitamin D deficiency     Past Surgical History:  Procedure Laterality Date  . APPENDECTOMY    . ARTHROTOMY Right 01/11/2016   Procedure: DISTAL INTERPHALANGEAL JOINT ARTHROTOMY RIGHT INDEX FINGER;  Surgeon: Leanora Cover, MD;  Location: [facility];  Service: Orthopedics;  Laterality: Right;  . COLONOSCOPY    . CYST EXCISION Right  01/11/2016   Procedure: EXCISION MUCOID CYST;  Surgeon: Leanora Cover, MD;  Location: [facility];  Service: Orthopedics;  Laterality: Right;  . LAPAROSCOPIC APPENDECTOMY N/A 10/31/2014   Procedure: APPENDECTOMY LAPAROSCOPIC;  Surgeon: Alphonsa Overall, MD;  Location: [facility];  Service: General;  Laterality: N/A;  . LEFT HEART CATH AND CORONARY ANGIOGRAPHY N/A 09/06/2018   Procedure: LEFT HEART CATH AND CORONARY ANGIOGRAPHY;  Surgeon: Troy Sine, MD;  Location: [facility];  Service: Cardiovascular;  Laterality: N/A;  . SALPINGOOPHORECTOMY Right 1999   "w/grapefruit-sized polyp"  . SHOULDER ARTHROSCOPY Right 2003   removal spurs  . SHOULDER ARTHROSCOPY W/ ROTATOR CUFF REPAIR Left 2012  . SINUS EXPLORATION  12/2013   [place]  . TENDON REPAIR Left 10/2009   torn tendon @ elbow  . VAGINAL HYSTERECTOMY  1978  . VIDEO ASSISTED THORACOSCOPY (VATS)/WEDGE RESECTION Left 09/28/2018   Procedure: LEFT VIDEO ASSISTED THORACOSCOPY (VATS)/Completion Left Upper lobe Lobectomy. Lymph node dissection. Intercostal nerve block.;  Surgeon: Grace Isaac, MD;  Location: [facility];  Service: Thoracic;  Laterality: Left;  Marland Kitchen VIDEO BRONCHOSCOPY N/A 09/28/2018   Procedure: VIDEO BRONCHOSCOPY;  Surgeon: Grace Isaac, MD;  Location: [facility];  Service: Thoracic;  Laterality: N/A;  Family History: Family History  Problem Relation Age of Onset  . Colon cancer Other        dx 40's/50's  . Colon cancer Other 75  . Diabetes Mother   . Skin cancer Mother 5       unsure what type  . Seizures Sister   . Stroke Maternal Grandmother   . Hyperlipidemia Maternal Grandfather   . Arthritis Paternal Grandmother   . Melanoma Paternal Grandmother 42  . Colon polyps Father        has had about 5 every c-scope (every 5 years-ish)  . Colon cancer Other 60  . Cancer Other        type unknown  . Stomach cancer Neg Hx   . Esophageal cancer Neg Hx   . Rectal cancer Neg Hx     Social History:   reports  that she quit smoking about 2 months ago. Her smoking use included cigarettes. She has a 50.00 pack-year smoking history. She has never used smokeless tobacco. She reports that she does not drink alcohol and does not use drugs.  Medications: Medications Prior to Admission  Medication Sig Dispense Refill  . aspirin EC 81 MG tablet Take 81 mg by mouth daily.    . cetirizine (ZYRTEC) 10 MG tablet Take 10 mg by mouth daily.    . Cholecalciferol (VITAMIN D-3) 125 MCG (5000 UT) TABS Take 10,000 Units by mouth daily. Please discuss dosage with medical doctor as is a high dose 30 tablet 0  . estradiol (ESTRACE) 1 MG tablet Take 1 mg by mouth daily.  3  . metoprolol succinate (TOPROL-XL) 25 MG 24 hr tablet TAKE 1 TABLET BY MOUTH EVERY DAY 90 tablet 3  . montelukast (SINGULAIR) 10 MG tablet Take 1 tablet Daily for Allergies 90 tablet 3  . omeprazole (PRILOSEC) 40 MG capsule Take 1 capsule 2 x  /day with Bkfst & Supper for Heartburn & Reflux 180 capsule 3  . rosuvastatin (CRESTOR) 40 MG tablet TAKE ONE TABLET BY MOUTH DAILY 90 tablet 3  . sertraline (ZOLOFT) 100 MG tablet TAKE 1 TABLET BY MOUTH DAILY FOR MOOD 90 tablet 3  . albuterol (PROVENTIL) (2.5 MG/3ML) 0.083% nebulizer solution Take 3 mLs (2.5 mg total) by nebulization every 6 (six) hours as needed for wheezing or shortness of breath. 75 mL 12  . albuterol (VENTOLIN HFA) 108 (90 Base) MCG/ACT inhaler USE 2 INHALATIONS 25 MINUTES APART EVERY 4 HOURS IF NEEDED TO RESCUE ASTHMA ATTACK 8.5 g 1  . Coenzyme Q10 (COQ10) 200 MG CAPS Take 200 mg by mouth daily.    . diphenhydrAMINE (BENADRYL) 25 MG tablet Take 25 mg by mouth daily.    Marland Kitchen ibuprofen (ADVIL,MOTRIN) 200 MG tablet Take 600 mg by mouth every 8 (eight) hours as needed for headache or moderate pain.     Marland Kitchen SHINGRIX injection       No results found for this or any previous visit (from the past 48 hour(s)).  No results found.   A comprehensive review of systems was negative.  Height 5\' 4"  (1.626  m), weight 86.2 kg.  General appearance: alert, cooperative and appears stated age Head: Normocephalic, without obvious abnormality, atraumatic Neck: supple, symmetrical, trachea midline Cardio: regular rate and rhythm Resp: clear to auscultation bilaterally Extremities: Intact sensation and capillary refill all digits.  +epl/fpl/io.  No wounds.  Pulses: 2+ and symmetric Skin: Skin color, texture, turgor normal. No rashes or lesions Neurologic: Grossly normal Incision/Wound: none  Assessment/Plan Right wrist  volar ganglion cyst.  Non operative and operative treatment options have been discussed with the patient and patient wishes to proceed with operative treatment. Risks, benefits, and alternatives of surgery have been discussed and the patient agrees with the plan of care.   Leanora Cover 02/21/2020, 11:24 AM

## 2020-02-22 ENCOUNTER — Encounter (HOSPITAL_BASED_OUTPATIENT_CLINIC_OR_DEPARTMENT_OTHER): Payer: Self-pay | Admitting: Orthopedic Surgery

## 2020-02-23 ENCOUNTER — Encounter (HOSPITAL_BASED_OUTPATIENT_CLINIC_OR_DEPARTMENT_OTHER): Payer: Self-pay | Admitting: Orthopedic Surgery

## 2020-02-23 LAB — SURGICAL PATHOLOGY

## 2020-02-23 NOTE — Anesthesia Postprocedure Evaluation (Signed)
Anesthesia Post Note  Patient: Karen Barrera  Procedure(s) Performed: REMOVAL GANGLION OF WRIST (Right Wrist)     Patient location during evaluation: PACU Anesthesia Type: MAC and Bier Block Level of consciousness: awake and alert Pain management: pain level controlled Vital Signs Assessment: post-procedure vital signs reviewed and stable Respiratory status: spontaneous breathing, nonlabored ventilation, respiratory function stable and patient connected to nasal cannula oxygen Cardiovascular status: stable and blood pressure returned to baseline Postop Assessment: no apparent nausea or vomiting Anesthetic complications: no   No complications documented.  Last Vitals:  Vitals:   02/21/20 1528 02/21/20 1552  BP: (!) 147/74 (!) 144/84  Pulse: 58 58  Resp: 13 16  Temp:  36.4 C  SpO2: 100% 99%    Last Pain:  Vitals:   02/22/20 0931  TempSrc:   PainSc: 1                  Maureena Dabbs

## 2020-03-15 ENCOUNTER — Other Ambulatory Visit: Payer: Self-pay | Admitting: Cardiothoracic Surgery

## 2020-03-15 DIAGNOSIS — Z85118 Personal history of other malignant neoplasm of bronchus and lung: Secondary | ICD-10-CM

## 2020-03-22 ENCOUNTER — Encounter: Payer: Self-pay | Admitting: Internal Medicine

## 2020-03-22 NOTE — Progress Notes (Signed)
History of Present Illness:       This very nice 69 y.o.   [name] presents for 3 month follow up with HTN, HLD, Pre-Diabetes and Vitamin D Deficiency. Patient is followed by Dr Karen Barrera for CPD and is s/p VATS for LUL small cell lung cancer in 09/2018.  Patient has GERD controlled on her meds.        Patient is treated for labile HTN & minimal  NICAD with Metoprolol.  BP has been controlled and todays BP is at goal - 108/72. Patient has had no complaints of any cardiac type chest pain, palpitations, dyspnea / orthopnea / PND, dizziness, claudication, or dependent edema.      Hyperlipidemia is controlled with diet & Rosuvastatin. Patient denies myalgias or other med SEs. Last Lipids were at goal:  Lab Results  Component Value Date   CHOL 139 12/01/2019   HDL 54 12/01/2019   LDLCALC 63 12/01/2019   TRIG 134 12/01/2019   CHOLHDL 2.6 12/01/2019    Also, the patient has history of PreDiabetes  (A1c 6.2 / 2015 and  2016)  and has had no symptoms of reactive hypoglycemia, diabetic polys, paresthesias or visual blurring.  Last A1c was not at goal:  Lab Results  Component Value Date   HGBA1C 6.0 (H) 12/01/2019       Further, the patient also has history of Vitamin D Deficiency and supplements vitamin D without any suspected side-effects. Last vitamin D was near goal (70-100):   Lab Results  Component Value Date   VD25OH 58 12/01/2019    Current Outpatient Medications on File Prior to Visit  Medication Sig   albuterol (PROVENTIL) (2.5 MG/3ML) 0.083% nebulizer solution Take 3 mLs (2.5 mg total) by nebulization every 6 (six) hours as needed for wheezing or shortness of breath.   albuterol (VENTOLIN HFA) 108 (90 Base) MCG/ACT inhaler USE 2 INHALATIONS 25 MINUTES APART EVERY 4 HOURS IF NEEDED TO RESCUE ASTHMA ATTACK   aspirin EC 81 MG tablet Take 81 mg by mouth daily.   cetirizine (ZYRTEC) 10 MG tablet Take 10 mg by mouth daily.   Cholecalciferol (VITAMIN D-3) 125 MCG (5000  UT) TABS Take 10,000 Units by mouth daily. Please discuss dosage with medical doctor as is a high dose   diphenhydrAMINE (BENADRYL) 25 MG tablet Take 25 mg by mouth daily.   estradiol (ESTRACE) 1 MG tablet Take 1 mg by mouth daily.   ibuprofen (ADVIL,MOTRIN) 200 MG tablet Take 600 mg by mouth every 8 (eight) hours as needed for headache or moderate pain.    metoprolol succinate (TOPROL-XL) 25 MG 24 hr tablet TAKE 1 TABLET BY MOUTH EVERY DAY   montelukast (SINGULAIR) 10 MG tablet Take 1 tablet Daily for Allergies   omeprazole (PRILOSEC) 40 MG capsule Take 1 capsule 2 x  /day with Bkfst & Supper for Heartburn & Reflux   rosuvastatin (CRESTOR) 40 MG tablet TAKE ONE TABLET BY MOUTH DAILY   sertraline (ZOLOFT) 100 MG tablet TAKE 1 TABLET BY MOUTH DAILY FOR MOOD   No current facility-administered medications on file prior to visit.    Allergies  Allergen Reactions   Chantix [Varenicline] Anaphylaxis and Swelling    Makes throat swell up   Moxifloxacin Hcl In Nacl Other (See Comments)    aches    PMHx:   Past Medical History:  Diagnosis Date   Acute perforated appendicitis 10/31/2014   Adenomatous colon polyp    Allergic rhinitis  Allergy    Anemia    Arthritis    "fingers" (10/31/2014)   Chronic bronchitis ([place])    "got it q yr til I had my nose OR" (10/31/2014)   COPD (chronic obstructive pulmonary disease) ([place])    Coronary artery disease    DDD (degenerative disc disease), cervical    DDD (degenerative disc disease), lumbar    Depression    "short term when my sister passed away unexpectedly"   Family history of adverse reaction to anesthesia    "mother is hard to wake up; a little goes a long way w/her"   Family history of breast cancer    Family history of colon cancer    Family history of skin cancer    GERD (gastroesophageal reflux disease)    History of stomach ulcers    Hyperlipidemia    "borderline; RX is preventative" (10/31/2014)   Kidney  stones    "they passed"   Lung cancer ([place]) 09/28/2018   Migraine    "used to get them alot; don't get them anymore" (10/31/2014)   Osteoarthritis of both knees    Osteopenia    Pneumonia 2003 X 1   Pre-diabetes    Seizures ([place]) ~ 1962 X 1   "from an abscessed wisdom tooth"   Tobacco dependence    Vitamin D deficiency     Immunization History  Administered Date(s) Administered   Influenza Split 05/16/2013, 05/12/2014, 05/14/2015   Influenza, High Dose Seasonal PF 05/15/2017, 05/07/2018, 03/29/2019   Influenza, Seasonal, Injecte, Preservative Fre 05/13/2016   PFIZER SARS-COV-2 Vaccination 10/01/2019, 10/22/2019   Pneumococcal Conjugate-13 11/12/2016   Pneumococcal Polysaccharide-23 06/14/2011   Tdap 03/27/2010   Zoster 04/14/2012    Past Surgical History:  Procedure Laterality Date   APPENDECTOMY     ARTHROTOMY Right 01/11/2016   Procedure: DISTAL INTERPHALANGEAL JOINT ARTHROTOMY RIGHT INDEX FINGER;  Surgeon: Leanora Cover, MD;  Location: [facility];  Service: Orthopedics;  Laterality: Right;   COLONOSCOPY     CYST EXCISION Right 01/11/2016   Procedure: EXCISION MUCOID CYST;  Surgeon: Leanora Cover, MD;  Location: [facility];  Service: Orthopedics;  Laterality: Right;   GANGLION CYST EXCISION Right 02/21/2020   Procedure: REMOVAL GANGLION OF WRIST;  Surgeon: Leanora Cover, MD;  Location: [facility];  Service: Orthopedics;  Laterality: Right;   LAPAROSCOPIC APPENDECTOMY N/A 10/31/2014   Procedure: APPENDECTOMY LAPAROSCOPIC;  Surgeon: Alphonsa Overall, MD;  Location: [facility];  Service: General;  Laterality: N/A;   LEFT HEART CATH AND CORONARY ANGIOGRAPHY N/A 09/06/2018   Procedure: LEFT HEART CATH AND CORONARY ANGIOGRAPHY;  Surgeon: Troy Sine, MD;  Location: [facility];  Service: Cardiovascular;  Laterality: N/A;   SALPINGOOPHORECTOMY Right 1999   "w/grapefruit-sized polyp"   SHOULDER ARTHROSCOPY Right 2003    removal spurs   SHOULDER ARTHROSCOPY W/ ROTATOR CUFF REPAIR Left 2012   SINUS EXPLORATION  12/2013   [facility]   TENDON REPAIR Left 10/2009   torn tendon @ elbow   VAGINAL HYSTERECTOMY  1978   VIDEO ASSISTED THORACOSCOPY (VATS)/WEDGE RESECTION Left 09/28/2018   Procedure: LEFT VIDEO ASSISTED THORACOSCOPY (VATS)/Completion Left Upper lobe Lobectomy. Lymph node dissection. Intercostal nerve block.;  Surgeon: Grace Isaac, MD;  Location: [facility];  Service: Thoracic;  Laterality: Left;   VIDEO BRONCHOSCOPY N/A 09/28/2018   Procedure: VIDEO BRONCHOSCOPY;  Surgeon: Grace Isaac, MD;  Location: [facility];  Service: Thoracic;  Laterality: N/A;    FHx:    Reviewed /  unchanged  SHx:    Reviewed / unchanged   Systems Review:  Constitutional: Denies fever, chills, wt changes, headaches, insomnia, fatigue, night sweats, change in appetite. Eyes: Denies redness, blurred vision, diplopia, discharge, itchy, watery eyes.  ENT: Denies discharge, congestion, post nasal drip, epistaxis, sore throat, earache, hearing loss, dental pain, tinnitus, vertigo, sinus pain, snoring.  CV: Denies chest pain, palpitations, irregular heartbeat, syncope, dyspnea, diaphoresis, orthopnea, PND, claudication or edema. Respiratory: denies cough, dyspnea, DOE, pleurisy, hoarseness, laryngitis, wheezing.  Gastrointestinal: Denies dysphagia, odynophagia, heartburn, reflux, water brash, abdominal pain or cramps, nausea, vomiting, bloating, diarrhea, constipation, hematemesis, melena, hematochezia  or hemorrhoids. Genitourinary: Denies dysuria, frequency, urgency, nocturia, hesitancy, discharge, hematuria or flank pain. Musculoskeletal: Denies arthralgias, myalgias, stiffness, jt. swelling, pain, limping or strain/sprain.  Skin: Denies pruritus, rash, hives, warts, acne, eczema or change in skin lesion(s). Neuro: No weakness, tremor, incoordination, spasms, paresthesia or pain. Psychiatric: Denies confusion, memory loss or  sensory loss. Endo: Denies change in weight, skin or hair change.  Heme/Lymph: No excessive bleeding, bruising or enlarged lymph nodes.  Physical Exam  BP 108/72    Pulse 76    Temp (!) 97.4 F (36.3 C)    Resp 16    Ht 5\' 4"  (1.626 m)    Wt 189 lb 9.6 oz (86 kg)    BMI 32.54 kg/m   Appears  well nourished, well groomed  and in no distress.  Eyes: PERRLA, EOMs, conjunctiva no swelling or erythema. Sinuses: No frontal/maxillary tenderness ENT/Mouth: EAC's clear, TM's nl w/o erythema, bulging. Nares clear w/o erythema, swelling, exudates. Oropharynx clear without erythema or exudates. Oral hygiene is good. Tongue normal, non obstructing. Hearing intact.  Neck: Supple. Thyroid not palpable. Car 2+/2+ without bruits, nodes or JVD. Chest: Respirations nl with BS clear & equal w/o rales, rhonchi, wheezing or stridor.  Cor: Heart sounds normal w/ regular rate and rhythm without sig. murmurs, gallops, clicks or rubs. Peripheral pulses normal and equal  without edema.  Abdomen: Soft & bowel sounds normal. Non-tender w/o guarding, rebound, hernias, masses or organomegaly.  Lymphatics: Unremarkable.  Musculoskeletal: Full ROM all peripheral extremities, joint stability, 5/5 strength and normal gait.  Skin: Warm, dry without exposed rashes, lesions or ecchymosis apparent.  Neuro: Cranial nerves intact, reflexes equal bilaterally. Sensory-motor testing grossly intact. Tendon reflexes grossly intact.  Pysch: Alert & oriented x 3.  Insight and judgement nl & appropriate. No ideations.  Assessment and Plan:  1. Labile hypertension  - Continue medication, monitor blood pressure at home.  - Continue DASH diet.  Reminder to go to the ER if any CP,  SOB, nausea, dizziness, severe HA, changes vision/speech.  - CBC with Differential/Platelet - COMPLETE METABOLIC PANEL WITH GFR - Magnesium - TSH  2. Hyperlipidemia, mixed  - Continue diet/meds, exercise,& lifestyle modifications.  - Continue monitor  periodic cholesterol/liver & renal functions   - Lipid panel - TSH  3. Abnormal glucose  - Continue diet, exercise  - Lifestyle modifications.  - Monitor appropriate labs.  - Hemoglobin A1c - Insulin, random  4. Vitamin D deficiency  - Continue supplementation.   - VITAMIN D 25 Hydroxy  5. Prediabetes  - Hemoglobin A1c - Insulin, random  6. COPD mixed type ([place])   7. Medication management  - CBC with Differential/Platelet - COMPLETE METABOLIC PANEL WITH GFR - Magnesium - Lipid panel - TSH - Hemoglobin A1c - Insulin, random - VITAMIN D 25 Hydroxy       Discussed  regular exercise, BP monitoring,  weight control to achieve/maintain BMI less than 25 and discussed med and SE's. Recommended labs to assess and monitor clinical status with further disposition pending results of labs.  I discussed the assessment and treatment plan with the patient. The patient was provided an opportunity to ask questions and all were answered. The patient agreed with the plan and demonstrated an understanding of the instructions.  I provided over 30 minutes of exam, counseling, chart review and  complex critical decision making.   Kirtland Bouchard, MD

## 2020-03-22 NOTE — Patient Instructions (Signed)

## 2020-03-23 ENCOUNTER — Other Ambulatory Visit: Payer: Self-pay

## 2020-03-23 ENCOUNTER — Ambulatory Visit (INDEPENDENT_AMBULATORY_CARE_PROVIDER_SITE_OTHER): Payer: Medicare Other | Admitting: Internal Medicine

## 2020-03-23 VITALS — BP 108/72 | HR 76 | Temp 97.4°F | Resp 16 | Ht 64.0 in | Wt 189.6 lb

## 2020-03-23 DIAGNOSIS — Z79899 Other long term (current) drug therapy: Secondary | ICD-10-CM | POA: Diagnosis not present

## 2020-03-23 DIAGNOSIS — J449 Chronic obstructive pulmonary disease, unspecified: Secondary | ICD-10-CM | POA: Diagnosis not present

## 2020-03-23 DIAGNOSIS — R0989 Other specified symptoms and signs involving the circulatory and respiratory systems: Secondary | ICD-10-CM

## 2020-03-23 DIAGNOSIS — R7309 Other abnormal glucose: Secondary | ICD-10-CM | POA: Diagnosis not present

## 2020-03-23 DIAGNOSIS — E559 Vitamin D deficiency, unspecified: Secondary | ICD-10-CM | POA: Diagnosis not present

## 2020-03-23 DIAGNOSIS — I2510 Atherosclerotic heart disease of native coronary artery without angina pectoris: Secondary | ICD-10-CM | POA: Diagnosis not present

## 2020-03-23 DIAGNOSIS — R7303 Prediabetes: Secondary | ICD-10-CM | POA: Diagnosis not present

## 2020-03-23 DIAGNOSIS — E782 Mixed hyperlipidemia: Secondary | ICD-10-CM | POA: Diagnosis not present

## 2020-03-24 NOTE — Progress Notes (Signed)
========================================================== -   Test results slightly outside the reference range are not unusual. If there is anything important, I will review this with you,  otherwise it is considered normal test values.  If you have further questions,  please do not hesitate to contact me at the office or via My Chart.  ==========================================================  -  Total Chol = 140 and LDL = 62 - Both  Excellent   - Very low risk for Heart Attack  / Stroke =============================================================  - A1c is up slightly from 6.0% to 6.2% - (May be due to the recent Prednisone)   - When Blood sugar and A1c are elevated in the borderline and  early or pre-diabetes range there is a   300% increased risk for heart attack, stroke, cancer and   alzheimer- type vascular dementia as full blown diabetes.   But the good news is that diet, exercise with  weight loss can cure the early diabetes at this point.  -    It is very important that you work harder with diet by  avoiding all foods that are white except chicken,   fish & calliflower.  - Avoid white rice  (brown & wild rice is OK),   - Avoid white potatoes  (sweet potatoes in moderation is OK),   White bread or wheat bread or anything made out of   white flour like bagels, donuts, rolls, buns, biscuits, cakes,  - pastries, cookies, pizza crust, and pasta (made from  white flour & egg whites)   - vegetarian pasta or spinach or wheat pasta is OK.  - Multigrain breads like Arnold's, Pepperidge Farm or   multigrain sandwich thins or high fiber breads like   Eureka bread or "Dave's Killer" breads that are  4 to 5 grams fiber per slice !  are best.    Diet, exercise and weight loss can reverse and cure  diabetes in the early stages.   ==========================================================  -  Vitamin D = 66   - Excellent   ==========================================================  -  All Else - CBC - Kidneys - Electrolytes - Liver - Magnesium & Thyroid    - all  Normal / OK ====================================================

## 2020-03-26 LAB — COMPLETE METABOLIC PANEL WITH GFR
AG Ratio: 2.3 (calc) (ref 1.0–2.5)
ALT: 32 U/L — ABNORMAL HIGH (ref 6–29)
AST: 32 U/L (ref 10–35)
Albumin: 4.6 g/dL (ref 3.6–5.1)
Alkaline phosphatase (APISO): 63 U/L (ref 37–153)
BUN: 16 mg/dL (ref 7–25)
CO2: 24 mmol/L (ref 20–32)
Calcium: 9.2 mg/dL (ref 8.6–10.4)
Chloride: 107 mmol/L (ref 98–110)
Creat: 0.84 mg/dL (ref 0.50–0.99)
GFR, Est African American: 83 mL/min/{1.73_m2} (ref >=60)
GFR, Est Non African American: 71 mL/min/{1.73_m2} (ref >=60)
Globulin: 2 g/dL (calc) (ref 1.9–3.7)
Glucose, Bld: 104 mg/dL — ABNORMAL HIGH (ref 65–99)
Potassium: 4.3 mmol/L (ref 3.5–5.3)
Sodium: 138 mmol/L (ref 135–146)
Total Bilirubin: 0.5 mg/dL (ref 0.2–1.2)
Total Protein: 6.6 g/dL (ref 6.1–8.1)

## 2020-03-26 LAB — CBC WITH DIFFERENTIAL/PLATELET
Absolute Monocytes: 469 cells/uL (ref 200–950)
Basophils Absolute: 61 cells/uL (ref 0–200)
Basophils Relative: 1.2 %
Eosinophils Absolute: 112 cells/uL (ref 15–500)
Eosinophils Relative: 2.2 %
HCT: 42.1 % (ref 35.0–45.0)
Hemoglobin: 13.8 g/dL (ref 11.7–15.5)
Lymphs Abs: 1321 cells/uL (ref 850–3900)
MCH: 29.5 pg (ref 27.0–33.0)
MCHC: 32.8 g/dL (ref 32.0–36.0)
MCV: 90 fL (ref 80.0–100.0)
MPV: 10.2 fL (ref 7.5–12.5)
Monocytes Relative: 9.2 %
Neutro Abs: 3137 cells/uL (ref 1500–7800)
Neutrophils Relative %: 61.5 %
Platelets: 188 10*3/uL (ref 140–400)
RBC: 4.68 10*6/uL (ref 3.80–5.10)
RDW: 13.2 % (ref 11.0–15.0)
Total Lymphocyte: 25.9 %
WBC: 5.1 10*3/uL (ref 3.8–10.8)

## 2020-03-26 LAB — LIPID PANEL
Cholesterol: 140 mg/dL (ref <200)
HDL: 61 mg/dL (ref >=50)
LDL Cholesterol (Calc): 62 mg/dL (calc)
Non-HDL Cholesterol (Calc): 79 mg/dL (calc) (ref <130)
Total CHOL/HDL Ratio: 2.3 (calc) (ref <5.0)
Triglycerides: 87 mg/dL (ref <150)

## 2020-03-26 LAB — VITAMIN D 25 HYDROXY (VIT D DEFICIENCY, FRACTURES): Vit D, 25-Hydroxy: 66 ng/mL (ref 30–100)

## 2020-03-26 LAB — HEMOGLOBIN A1C
Hgb A1c MFr Bld: 6.2 % of total Hgb — ABNORMAL HIGH (ref <5.7)
Mean Plasma Glucose: 131 (calc)
eAG (mmol/L): 7.3 (calc)

## 2020-03-26 LAB — TSH: TSH: 2.13 mIU/L (ref 0.40–4.50)

## 2020-03-26 LAB — INSULIN, RANDOM: Insulin: 14.6 u[IU]/mL

## 2020-03-26 LAB — MAGNESIUM: Magnesium: 2.4 mg/dL (ref 1.5–2.5)

## 2020-04-06 DIAGNOSIS — M8588 Other specified disorders of bone density and structure, other site: Secondary | ICD-10-CM | POA: Diagnosis not present

## 2020-04-06 DIAGNOSIS — N958 Other specified menopausal and perimenopausal disorders: Secondary | ICD-10-CM | POA: Diagnosis not present

## 2020-04-06 DIAGNOSIS — A630 Anogenital (venereal) warts: Secondary | ICD-10-CM | POA: Diagnosis not present

## 2020-04-06 DIAGNOSIS — Z1231 Encounter for screening mammogram for malignant neoplasm of breast: Secondary | ICD-10-CM | POA: Diagnosis not present

## 2020-04-06 DIAGNOSIS — Z6832 Body mass index (BMI) 32.0-32.9, adult: Secondary | ICD-10-CM | POA: Diagnosis not present

## 2020-04-06 DIAGNOSIS — N959 Unspecified menopausal and perimenopausal disorder: Secondary | ICD-10-CM | POA: Diagnosis not present

## 2020-04-06 DIAGNOSIS — Z01419 Encounter for gynecological examination (general) (routine) without abnormal findings: Secondary | ICD-10-CM | POA: Diagnosis not present

## 2020-04-06 LAB — HM DEXA SCAN

## 2020-04-10 ENCOUNTER — Other Ambulatory Visit: Payer: Self-pay | Admitting: Obstetrics and Gynecology

## 2020-04-10 DIAGNOSIS — R928 Other abnormal and inconclusive findings on diagnostic imaging of breast: Secondary | ICD-10-CM

## 2020-04-17 DIAGNOSIS — D071 Carcinoma in situ of vulva: Secondary | ICD-10-CM | POA: Diagnosis not present

## 2020-04-17 DIAGNOSIS — A630 Anogenital (venereal) warts: Secondary | ICD-10-CM | POA: Diagnosis not present

## 2020-04-18 ENCOUNTER — Other Ambulatory Visit: Payer: Self-pay

## 2020-04-18 ENCOUNTER — Ambulatory Visit
Admission: RE | Admit: 2020-04-18 | Discharge: 2020-04-18 | Disposition: A | Discharge status: Home / Self Care | Payer: Medicare Other | Source: Ambulatory Visit | Attending: Obstetrics and Gynecology | Admitting: Obstetrics and Gynecology

## 2020-04-18 ENCOUNTER — Other Ambulatory Visit: Payer: Self-pay | Admitting: Obstetrics and Gynecology

## 2020-04-18 DIAGNOSIS — R928 Other abnormal and inconclusive findings on diagnostic imaging of breast: Secondary | ICD-10-CM

## 2020-04-18 DIAGNOSIS — N6489 Other specified disorders of breast: Secondary | ICD-10-CM | POA: Diagnosis not present

## 2020-04-18 DIAGNOSIS — R922 Inconclusive mammogram: Secondary | ICD-10-CM | POA: Diagnosis not present

## 2020-04-18 LAB — HM MAMMOGRAPHY

## 2020-04-24 ENCOUNTER — Other Ambulatory Visit: Payer: Self-pay | Admitting: Internal Medicine

## 2020-04-25 ENCOUNTER — Other Ambulatory Visit: Payer: Self-pay

## 2020-04-25 ENCOUNTER — Ambulatory Visit
Admission: RE | Admit: 2020-04-25 | Discharge: 2020-04-25 | Disposition: A | Discharge status: Home / Self Care | Payer: Medicare Other | Source: Ambulatory Visit | Attending: Obstetrics and Gynecology | Admitting: Obstetrics and Gynecology

## 2020-04-25 DIAGNOSIS — R928 Other abnormal and inconclusive findings on diagnostic imaging of breast: Secondary | ICD-10-CM

## 2020-04-25 DIAGNOSIS — N6312 Unspecified lump in the right breast, upper inner quadrant: Secondary | ICD-10-CM | POA: Diagnosis not present

## 2020-04-25 DIAGNOSIS — N62 Hypertrophy of breast: Secondary | ICD-10-CM | POA: Diagnosis not present

## 2020-04-26 ENCOUNTER — Ambulatory Visit (INDEPENDENT_AMBULATORY_CARE_PROVIDER_SITE_OTHER): Payer: Medicare Other | Admitting: Cardiothoracic Surgery

## 2020-04-26 ENCOUNTER — Ambulatory Visit
Admission: RE | Admit: 2020-04-26 | Discharge: 2020-04-26 | Disposition: A | Discharge status: Home / Self Care | Payer: Medicare Other | Source: Ambulatory Visit | Attending: Cardiothoracic Surgery | Admitting: Cardiothoracic Surgery

## 2020-04-26 ENCOUNTER — Encounter: Payer: Self-pay | Admitting: Cardiothoracic Surgery

## 2020-04-26 VITALS — BP 104/70 | HR 73 | Temp 97.7°F | Resp 20 | Ht 64.0 in | Wt 186.6 lb

## 2020-04-26 DIAGNOSIS — R911 Solitary pulmonary nodule: Secondary | ICD-10-CM | POA: Diagnosis not present

## 2020-04-26 DIAGNOSIS — Z85118 Personal history of other malignant neoplasm of bronchus and lung: Secondary | ICD-10-CM | POA: Diagnosis not present

## 2020-04-26 DIAGNOSIS — I2510 Atherosclerotic heart disease of native coronary artery without angina pectoris: Secondary | ICD-10-CM | POA: Diagnosis not present

## 2020-04-26 DIAGNOSIS — D071 Carcinoma in situ of vulva: Secondary | ICD-10-CM | POA: Diagnosis not present

## 2020-04-26 DIAGNOSIS — I700 Atherosclerosis of aorta: Secondary | ICD-10-CM | POA: Diagnosis not present

## 2020-04-26 DIAGNOSIS — J984 Other disorders of lung: Secondary | ICD-10-CM | POA: Diagnosis not present

## 2020-04-26 NOTE — Progress Notes (Signed)
[address]       [place] [address]             [phone]      Karen Barrera [place] Medical Record #[id number] Date of Birth: 07/12/1951  Referring: Deneise Lever, MD Primary Care: [name], MD Primary Cardiologist: Shelva Majestic, MD   Chief Complaint:   POST OP FOLLOW UP 09/28/2018 PREOPERATIVE DIAGNOSIS:  Left upper lobe lung mass. POSTOPERATIVE DIAGNOSIS:  Left upper lobe lung mass, nonsmall-cell lung cancer by frozen section. PROCEDURE PERFORMED:  Bronchoscopy, left video-assisted thoracoscopy, wedge resection, left upper lobe, completion left upper lobectomy with lymph node dissection and intercostal nerve blocks. SURGEON:  Lanelle Bal, MD  History of Present Illness:      Patient doing well now approximately 1 1/2 years after resection of left upper lobe lung mass stage Ia 2 non-small cell carcinoma of the lung-currently under observation only.  Patient has continued to be smoke-free.  She notes she has had low chronic back pain but denies any upper thoracic spine or neck pain.  Does note loss of taste since her lobectomy.  She has avoided Covid infections and has been vaccinated.    Cancer Staging Lung cancer [facility]) Staging form: Lung, AJCC 8th Edition - Pathologic stage from 09/29/2018: Stage IA2 (pT1b, pN0, cM0) - Signed by Grace Isaac, MD on 09/29/2018    Past Medical History:  Diagnosis Date  . Acute perforated appendicitis 10/31/2014  . Adenomatous colon polyp   . Allergic rhinitis   . Allergy   . Anemia   . Arthritis    "fingers" (10/31/2014)  . Chronic bronchitis ([place])    "got it q yr til I had my nose OR" (10/31/2014)  . COPD (chronic obstructive pulmonary disease) ([place])   . Coronary artery disease   . DDD (degenerative disc disease), cervical   . DDD (degenerative disc disease), lumbar   . Depression    "short term when my sister passed away unexpectedly"  . Family history of adverse reaction to  anesthesia    "mother is hard to wake up; a little goes a long way w/her"  . Family history of breast cancer   . Family history of colon cancer   . Family history of skin cancer   . GERD (gastroesophageal reflux disease)   . History of stomach ulcers   . Hyperlipidemia    "borderline; RX is preventative" (10/31/2014)  . Kidney stones    "they passed"  . Lung cancer ([place]) 09/28/2018  . Migraine    "used to get them alot; don't get them anymore" (10/31/2014)  . Osteoarthritis of both knees   . Osteopenia   . Pneumonia 2003 X 1  . Pre-diabetes   . Seizures ([place]) ~ 1962 X 1   "from an abscessed wisdom tooth"  . Tobacco dependence   . Vitamin D deficiency      Social History   Tobacco Use  Smoking Status Former Smoker  . Packs/day: 1.00  . Years: 50.00  . Pack years: 50.00  . Types: Cigarettes  . Quit date: 11/27/2019  . Years since quitting: 0.4  Smokeless Tobacco Never Used  Tobacco Comment   Quit 09/27/2018    Social History   Substance and Sexual Activity  Alcohol Use No  . Alcohol/week: 0.0 standard drinks     Allergies  Allergen Reactions  . Chantix [Varenicline] Anaphylaxis and Swelling    Makes throat swell up  .  Moxifloxacin Hcl In Nacl Other (See Comments)    aches    Current Outpatient Medications  Medication Sig Dispense Refill  . albuterol (PROVENTIL) (2.5 MG/3ML) 0.083% nebulizer solution Take 3 mLs (2.5 mg total) by nebulization every 6 (six) hours as needed for wheezing or shortness of breath. 75 mL 12  . aspirin EC 81 MG tablet Take 81 mg by mouth daily.    . calcium carbonate (OSCAL) 1500 (600 Ca) MG TABS tablet Take 600 mg of elemental calcium by mouth 2 (two) times daily with a meal.    . cetirizine (ZYRTEC) 10 MG tablet Take 10 mg by mouth daily.    . Cholecalciferol (VITAMIN D-3) 125 MCG (5000 UT) TABS Take 10,000 Units by mouth daily. Please discuss dosage with medical doctor as is a high dose 30 tablet 0  . diphenhydrAMINE (BENADRYL) 25 MG tablet  Take 25 mg by mouth daily.    Marland Kitchen ibuprofen (ADVIL,MOTRIN) 200 MG tablet Take 600 mg by mouth every 8 (eight) hours as needed for headache or moderate pain.     . metoprolol succinate (TOPROL-XL) 25 MG 24 hr tablet TAKE 1 TABLET BY MOUTH EVERY DAY 90 tablet 3  . montelukast (SINGULAIR) 10 MG tablet TAKE 1 TABLET DAILY FOR    ALLERGIES 90 tablet 3  . omeprazole (PRILOSEC) 40 MG capsule Take 1 capsule 2 x  /day with Bkfst & Supper for Heartburn & Reflux 180 capsule 3  . rosuvastatin (CRESTOR) 40 MG tablet TAKE ONE TABLET BY MOUTH DAILY 90 tablet 3  . sertraline (ZOLOFT) 100 MG tablet TAKE 1 TABLET BY MOUTH DAILY FOR MOOD 90 tablet 3  . albuterol (VENTOLIN HFA) 108 (90 Base) MCG/ACT inhaler USE 2 INHALATIONS 25 MINUTES APART EVERY 4 HOURS IF NEEDED TO RESCUE ASTHMA ATTACK (Patient not taking: Reported on 04/26/2020) 8.5 g 1  . estradiol (ESTRACE) 1 MG tablet Take 1 mg by mouth daily. (Patient not taking: Reported on 04/26/2020)  3   No current facility-administered medications for this visit.       Physical Exam: BP 104/70   Pulse 73   Temp 97.7 F (36.5 C)   Resp 20   Ht 5\' 4"  (1.626 m)   Wt 186 lb 9.6 oz (84.6 kg)   SpO2 93%   BMI 32.03 kg/m  General appearance: alert, cooperative and no distress Neck: no adenopathy, no carotid bruit, no JVD, supple, symmetrical, trachea midline and thyroid not enlarged, symmetric, no tenderness/mass/nodules Lymph nodes: Cervical, supraclavicular, and axillary nodes normal. Resp: clear to auscultation bilaterally Cardio: regular rate and rhythm, S1, S2 normal, no murmur, click, rub or gallop GI: soft, non-tender; bowel sounds normal; no masses,  no organomegaly Extremities: extremities normal, atraumatic, no cyanosis or edema Neurologic: Grossly normal   Diagnostic Studies & Laboratory data:     Recent Radiology Findings:  CT Chest Wo Contrast  Result Date: 04/26/2020 CLINICAL DATA:  October 20, 2019 FINDINGS: Cardiovascular: Calcified atheromatous  plaque of the thoracic aorta. No aneurysmal dilation. Central pulmonary vasculature unchanged with respect caliber. No pericardial effusion. Three-vessel coronary artery disease as before. Limited assessment of cardiovascular structures given lack of intravenous contrast. Mediastinum/Nodes: No adenopathy in the chest. Esophagus grossly normal. Lungs/Pleura: Post LEFT upper lobectomy with bandlike scarring in the LEFT lower lobe extending anteriorly towards the anterior LEFT upper chest. Small nodular focus along these bandlike changes is unchanged approximately 4 mm (image 50, series 8) no effusion. No acute consolidative changes or airway abnormality. Upper Abdomen: Severe hepatic steatosis.  Stable low-density lesion along the posterior RIGHT hemi liver compatible with a cyst is unchanged dating back to 2016 rounded area in the caudate lobe measuring 2.7 x 1.9 cm with variable attenuation with respect to the liver parenchyma was present as far back is 2020 but showed no FDG uptake. Adrenal thickening on the LEFT is unchanged and did not show FDG uptake on previous PET imaging. No acute upper abdominal process. Musculoskeletal: No acute musculoskeletal process. Some sclerosis at T1 posteriorly is unchanged mild sternal sclerosis of the sternal body in the upper margin of the sternum is also not changed dating back to January of 2020 showing no increased metabolic activity on prior PET. IMPRESSION: 1. Post LEFT upper lobectomy with bandlike scarring and associated small nodule in the LEFT lower lobe extending anteriorly towards the anterior LEFT upper chest. Findings are not changed. 2. Severe hepatic steatosis. Area of variable attenuation in the caudate lobe is been present over multiple prior studies and may represent variable fat deposition/focal fatty sparing and did not show FDG uptake on the August 31, 2018 PET exam. Given more focal appearance and slightly atypical location may consider MRI follow-up for  further assessment to exclude small lesions such as hepatic adenoma. 3. Areas of sclerosis in C6 and C7 as well as posterior aspect of T1 and the sternum not changed over time. Continued attention on follow-up is suggested. 4. Three-vessel coronary artery disease as before. 5. Aortic atherosclerosis. Aortic Atherosclerosis (ICD10-I70.0). Electronically Signed   By: Zetta Bills M.D.   On: 04/26/2020 10:23    Recent Lab Findings:  Lab Results  Component Value Date   WBC 5.1 03/23/2020   HGB 13.8 03/23/2020   HCT 42.1 03/23/2020   PLT 188 03/23/2020   GLUCOSE 104 (H) 03/23/2020   CHOL 140 03/23/2020   TRIG 87 03/23/2020   HDL 61 03/23/2020   LDLCALC 62 03/23/2020   ALT 32 (H) 03/23/2020   AST 32 03/23/2020   NA 138 03/23/2020   K 4.3 03/23/2020   CL 107 03/23/2020   CREATININE 0.84 03/23/2020   BUN 16 03/23/2020   CO2 24 03/23/2020   TSH 2.13 03/23/2020   INR 1.1 09/24/2018   HGBA1C 6.2 (H) 03/23/2020      Assessment / Plan:    #1 status post left upper lobectomy for stage I non-small cell lung cancer-CT scan done today shows no evidence of recurrence.    Plan follow-up CT of the chest in March 2022    Grace Isaac MD      [address] [place] [address] Office [phone]   [place]  04/29/2020 9:14 PM

## 2020-04-27 ENCOUNTER — Telehealth: Payer: Self-pay | Admitting: *Deleted

## 2020-04-27 NOTE — Telephone Encounter (Signed)
Attempted to reach the patient to scheduled a new patient appt. Left a message asking the patient to call the office back. Patient needs a new patient appt with Dr Denman George, per referring MD request

## 2020-04-30 ENCOUNTER — Other Ambulatory Visit: Payer: Self-pay | Admitting: Internal Medicine

## 2020-04-30 ENCOUNTER — Telehealth: Payer: Self-pay | Admitting: *Deleted

## 2020-04-30 NOTE — Telephone Encounter (Signed)
Patient returned call and scheduled a new patient appt with Dr Denman George; per Dr Elaina Pattee request. Patient scheduled to see Dr Denman George on 10/15 at 9 am. Patient given the address and phone number for the clinic; also gave the policy for mask and visitors

## 2020-05-07 ENCOUNTER — Other Ambulatory Visit: Payer: Self-pay | Admitting: Surgery

## 2020-05-07 DIAGNOSIS — N6489 Other specified disorders of breast: Secondary | ICD-10-CM | POA: Diagnosis not present

## 2020-05-09 ENCOUNTER — Ambulatory Visit: Payer: Medicare Other | Admitting: Gynecologic Oncology

## 2020-05-11 ENCOUNTER — Other Ambulatory Visit: Payer: Self-pay

## 2020-05-11 ENCOUNTER — Inpatient Hospital Stay: Payer: Medicare Other | Attending: Gynecologic Oncology | Admitting: Gynecologic Oncology

## 2020-05-11 ENCOUNTER — Encounter: Payer: Self-pay | Admitting: Gynecologic Oncology

## 2020-05-11 VITALS — BP 133/56 | HR 61 | Temp 97.0°F | Resp 18 | Ht 64.0 in | Wt 187.0 lb

## 2020-05-11 DIAGNOSIS — M5030 Other cervical disc degeneration, unspecified cervical region: Secondary | ICD-10-CM | POA: Insufficient documentation

## 2020-05-11 DIAGNOSIS — I2510 Atherosclerotic heart disease of native coronary artery without angina pectoris: Secondary | ICD-10-CM | POA: Diagnosis not present

## 2020-05-11 DIAGNOSIS — Z79899 Other long term (current) drug therapy: Secondary | ICD-10-CM | POA: Diagnosis not present

## 2020-05-11 DIAGNOSIS — Z85118 Personal history of other malignant neoplasm of bronchus and lung: Secondary | ICD-10-CM | POA: Insufficient documentation

## 2020-05-11 DIAGNOSIS — J449 Chronic obstructive pulmonary disease, unspecified: Secondary | ICD-10-CM | POA: Insufficient documentation

## 2020-05-11 DIAGNOSIS — D071 Carcinoma in situ of vulva: Secondary | ICD-10-CM | POA: Insufficient documentation

## 2020-05-11 DIAGNOSIS — M5136 Other intervertebral disc degeneration, lumbar region: Secondary | ICD-10-CM | POA: Insufficient documentation

## 2020-05-11 DIAGNOSIS — Z7982 Long term (current) use of aspirin: Secondary | ICD-10-CM | POA: Diagnosis not present

## 2020-05-11 DIAGNOSIS — Z87891 Personal history of nicotine dependence: Secondary | ICD-10-CM | POA: Insufficient documentation

## 2020-05-11 DIAGNOSIS — E785 Hyperlipidemia, unspecified: Secondary | ICD-10-CM | POA: Insufficient documentation

## 2020-05-11 NOTE — Patient Instructions (Signed)
Dr. Denman George is recommending that she follow-up with Dr. Royston Sinner in 6 months time for reinspection of the vulva to see if the areas have regrown.  If they have regrown, it would be appropriate to see Dr. Denman George for an excision at that time.  There is a 50% chance that the lesions will not regrow.  If the exam is normal in 6 months time, you should see Dr. Royston Sinner again in the fall 2022, and if the vulva remains normal at that time he can follow-up at annual checks.  Notify Dr. Royston Sinner or Dr. 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

## 2020-05-11 NOTE — Progress Notes (Signed)
Consult Note: Gyn-Onc  Consult was requested by Dr. Royston Sinner for the evaluation of Karen Barrera 69 y.o. female  CC:  Chief Complaint  Patient presents with  . VIN 3    Assessment/Plan:  Karen Barrera  is a 69 y.o.  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   I see no evidence of residual disease on today's examination, therefore I am unable to offer excisional or ablative procedures.  Given that 50% of lesions with VIN-III do not recur after excision with positive margins, I feel that is reasonable to offer her close surveillance.  I instructed her to follow-up with Dr. Royston Sinner in 6 months time for reinspection of the vulva.  If it remains free of lesions at 6 months she should follow-up at 12 months and then annually thereafter if she remains clinically disease-free.  Instructed her on the importance of continuing smoking cessation as tobacco use is directly associated with development of VIN 3.   HPI: Karen Barrera is a 69 year old P3 who was seen in consultation at the request of Dr Royston Sinner for evaluation of VIN 3.  The patient was seen Dr. Royston Sinner in September 2021 for her routine annual evaluation.  She had no symptoms of vulvar disease.  She had stopped smoking approximately 18 months prior to this visit.  At the time of evaluation by Dr. Royston Sinner warty lesions were seen on the anterior left labia minora and the right labia majora.  These were both biopsied with punch biopsy.  The physician's note states that the left labia minora lesion was partially removed with a punch biopsy and the right labia majora lesion was completely removed with the punch biopsy.  The size of both lesions were documented to be less than 1 cm (less than half a centimeter for the right).  Final pathology from this biopsy that was performed on April 17, 2020 revealed high-grade VIN 3 at both sites with dysplasia involving the biopsy margins.  Her medical history is most significant for  lung cancer in the spring 2020 that involved the left upper lobe and was treated with left upper lobectomy but did not require adjuvant chemotherapy or radiation.  She has been recently diagnosed with DCIS and is planning a needle localization procedure wide local excision.  She is an ex-smoker.  Her gynecologic history is remarkable for no history of abnormal Pap test.  She had recent gynecologic care and regular gynecologic care leading up to this visit.  She denies a history of being HPV positive.  She has had 3 prior vaginal deliveries.  Her family cancer history is unremarkable.  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 She lives with her husband of 21 years.   Current Meds:  Outpatient Encounter Medications as of 05/11/2020  Medication Sig  . albuterol (PROVENTIL) (2.5 MG/3ML) 0.083% nebulizer solution Take 3 mLs (2.5 mg total) by nebulization every 6 (six) hours as needed for wheezing or shortness of breath.  Marland Kitchen albuterol (VENTOLIN HFA) 108 (90 Base) MCG/ACT inhaler USE 2 INHALATIONS 25 MINUTES APART EVERY 4 HOURS IF NEEDED TO RESCUE ASTHMA ATTACK  . aspirin EC 81 MG tablet Take 81 mg by mouth daily.  . calcium carbonate (OSCAL) 1500 (600 Ca) MG TABS tablet Take 600 mg of elemental calcium by mouth 2 (two) times daily with a meal.  . cetirizine (ZYRTEC) 10 MG tablet Take 10 mg by mouth daily.  Marland Kitchen  Cholecalciferol (VITAMIN D-3) 125 MCG (5000 UT) TABS Take 10,000 Units by mouth daily. Please discuss dosage with medical doctor as is a high dose  . diphenhydrAMINE (BENADRYL) 25 MG tablet Take 25 mg by mouth daily.  Marland Kitchen ibuprofen (ADVIL,MOTRIN) 200 MG tablet Take 600 mg by mouth every 8 (eight) hours as needed for headache or moderate pain.   . metoprolol succinate (TOPROL-XL) 25 MG 24 hr tablet TAKE 1 TABLET BY MOUTH EVERY DAY  . montelukast (SINGULAIR) 10 MG tablet TAKE 1 TABLET DAILY FOR    ALLERGIES  . omeprazole (PRILOSEC) 40 MG capsule Take 1 capsule 2  x  /day with Bkfst & Supper for Heartburn & Reflux (Patient taking differently: Take 40 mg by mouth daily as needed. Take 1 capsule 2 x  /day with Bkfst & Supper for Heartburn & Reflux)  . rosuvastatin (CRESTOR) 40 MG tablet TAKE ONE TABLET BY MOUTH DAILY  . sertraline (ZOLOFT) 100 MG tablet TAKE 1 TABLET BY MOUTH DAILY FOR MOOD  . [DISCONTINUED] estradiol (ESTRACE) 1 MG tablet Take 1 mg by mouth daily. (Patient not taking: Reported on 04/26/2020)   No facility-administered encounter medications on file as of 05/11/2020.    Allergy:  Allergies  Allergen Reactions  . Chantix [Varenicline] Anaphylaxis and Swelling    Makes throat swell up  . Moxifloxacin Hcl In Nacl Other (See Comments)    aches    Social Hx:   Social History   Socioeconomic History  . Marital status: Married    Spouse name: Not on file  . Number of children: Not on file  . Years of education: Not on file  . Highest education level: Not on file  Occupational History  . Occupation: Retired  Tobacco Use  . Smoking status: Former Smoker    Packs/day: 1.00    Years: 50.00    Pack years: 50.00    Types: Cigarettes    Quit date: 11/27/2019    Years since quitting: 0.4  . Smokeless tobacco: Never Used  . Tobacco comment: Quit 09/27/2018  Vaping Use  . Vaping Use: Former  . Substances: Nicotine  Substance and Sexual Activity  . Alcohol use: No    Alcohol/week: 0.0 standard drinks  . Drug use: No  . Sexual activity: Not Currently  Other Topics Concern  . Not on file  Social History Narrative  . Not on file   Social Determinants of Health   Financial Resource Strain:   . Difficulty of Paying Living Expenses: Not on file  Food Insecurity:   . Worried About Charity fundraiser in the Last Year: Not on file  . Ran Out of Food in the Last Year: Not on file  Transportation Needs:   . Lack of Transportation (Medical): Not on file  . Lack of Transportation (Non-Medical): Not on file  Physical Activity:   . Days  of Exercise per Week: Not on file  . Minutes of Exercise per Session: Not on file  Stress:   . Feeling of Stress : Not on file  Social Connections:   . Frequency of Communication with Friends and Family: Not on file  . Frequency of Social Gatherings with Friends and Family: Not on file  . Attends Religious Services: Not on file  . Active Member of Clubs or Organizations: Not on file  . Attends Archivist Meetings: Not on file  . Marital Status: Not on file  Intimate Partner Violence:   . Fear of Current or Ex-Partner: Not  on file  . Emotionally Abused: Not on file  . Physically Abused: Not on file  . Sexually Abused: Not on file    Past Surgical Hx:  Past Surgical History:  Procedure Laterality Date  . APPENDECTOMY    . ARTHROTOMY Right 01/11/2016   Procedure: DISTAL INTERPHALANGEAL JOINT ARTHROTOMY RIGHT INDEX FINGER;  Surgeon: Leanora Cover, MD;  Location: [facility];  Service: Orthopedics;  Laterality: Right;  . COLONOSCOPY    . CYST EXCISION Right 01/11/2016   Procedure: EXCISION MUCOID CYST;  Surgeon: Leanora Cover, MD;  Location: [facility];  Service: Orthopedics;  Laterality: Right;  . GANGLION CYST EXCISION Right 02/21/2020   Procedure: REMOVAL GANGLION OF WRIST;  Surgeon: Leanora Cover, MD;  Location: [facility];  Service: Orthopedics;  Laterality: Right;  . LAPAROSCOPIC APPENDECTOMY N/A 10/31/2014   Procedure: APPENDECTOMY LAPAROSCOPIC;  Surgeon: Alphonsa Overall, MD;  Location: [facility];  Service: General;  Laterality: N/A;  . LEFT HEART CATH AND CORONARY ANGIOGRAPHY N/A 09/06/2018   Procedure: LEFT HEART CATH AND CORONARY ANGIOGRAPHY;  Surgeon: Troy Sine, MD;  Location: [facility];  Service: Cardiovascular;  Laterality: N/A;  . SALPINGOOPHORECTOMY Right 1999   "w/grapefruit-sized polyp"  . SHOULDER ARTHROSCOPY Right 2003   removal spurs  . SHOULDER ARTHROSCOPY W/ ROTATOR CUFF REPAIR Left 2012  . SINUS EXPLORATION   12/2013   [facility]  . TENDON REPAIR Left 10/2009   torn tendon @ elbow  . VAGINAL HYSTERECTOMY  1978  . VIDEO ASSISTED THORACOSCOPY (VATS)/WEDGE RESECTION Left 09/28/2018   Procedure: LEFT VIDEO ASSISTED THORACOSCOPY (VATS)/Completion Left Upper lobe Lobectomy. Lymph node dissection. Intercostal nerve block.;  Surgeon: Grace Isaac, MD;  Location: [facility];  Service: Thoracic;  Laterality: Left;  Marland Kitchen VIDEO BRONCHOSCOPY N/A 09/28/2018   Procedure: VIDEO BRONCHOSCOPY;  Surgeon: Grace Isaac, MD;  Location: [facility];  Service: Thoracic;  Laterality: N/A;    Past Medical Hx:  Past Medical History:  Diagnosis Date  . Acute perforated appendicitis 10/31/2014  . Adenomatous colon polyp   . Allergic rhinitis   . Allergy   . Anemia   . Arthritis    "fingers" (10/31/2014)  . Chronic bronchitis ([place])    "got it q yr til I had my nose OR" (10/31/2014)  . COPD (chronic obstructive pulmonary disease) ([place])   . Coronary artery disease   . DDD (degenerative disc disease), cervical   . DDD (degenerative disc disease), lumbar   . Depression    "short term when my sister passed away unexpectedly"  . Family history of adverse reaction to anesthesia    "mother is hard to wake up; a little goes a long way w/her"  . Family history of breast cancer   . Family history of colon cancer   . Family history of skin cancer   . GERD (gastroesophageal reflux disease)   . History of stomach ulcers   . Hyperlipidemia    "borderline; RX is preventative" (10/31/2014)  . Kidney stones    "they passed"  . Lung cancer ([place]) 09/28/2018  . Migraine    "used to get them alot; don't get them anymore" (10/31/2014)  . Osteoarthritis of both knees   . Osteopenia   . Pneumonia 2003 X 1  . Pre-diabetes   . Seizures ([place]) ~ 1962 X 1   "from an abscessed wisdom tooth"  . Tobacco dependence   . Vitamin D deficiency     Past Gynecological History:  See HPI  No LMP recorded. Patient has had a hysterectomy.  Family Hx:  Family  History  Problem Relation Age of Onset  . Colon cancer Other        dx 40's/50's  . Colon cancer Other 28  . Diabetes Mother   . Skin cancer Mother 67       unsure what type  . Seizures Sister   . Stroke Maternal Grandmother   . Hyperlipidemia Maternal Grandfather   . Arthritis Paternal Grandmother   . Melanoma Paternal Grandmother 36  . Colon polyps Father        has had about 5 every c-scope (every 5 years-ish)  . Colon cancer Other 82  . Cancer Other        type unknown  . Stomach cancer Neg Hx   . Esophageal cancer Neg Hx   . Rectal cancer Neg Hx     Review of Systems:  Constitutional  Feels well,    ENT Normal appearing ears and nares bilaterally Skin/Breast  No rash, sores, jaundice, itching, dryness Cardiovascular  No chest pain, shortness of breath, or edema  Pulmonary  No cough or wheeze.  Gastro Intestinal  No nausea, vomitting, or diarrhoea. No bright red blood per rectum, no abdominal pain, change in bowel movement, or constipation.  Genito Urinary  No frequency, urgency, dysuria, no bleeding or pruritis Musculo Skeletal  No myalgia, arthralgia, joint swelling or pain  Neurologic  No weakness, numbness, change in gait,  Psychology  No depression, anxiety, insomnia.   Vitals:  Blood pressure (!) 133/56, pulse 61, temperature (!) 97 F (36.1 C), temperature source Tympanic, resp. rate 18, height 5\' 4"  (1.626 m), weight 187 lb (84.8 kg), SpO2 99 %.  Physical Exam: WD in NAD Neck  Supple NROM, without any enlargements.  Lymph Node Survey No cervical supraclavicular or inguinal adenopathy Cardiovascular  Pulse normal rate, regularity and rhythm. S1 and S2 normal.  Lungs  Clear to auscultation bilateraly, without wheezes/crackles/rhonchi. Good air movement.  Skin  No rash/lesions/breakdown  Psychiatry  Alert and oriented to person, place, and time  Abdomen  Normoactive bowel sounds, abdomen soft, non-tender and obese without evidence of hernia.   Back No CVA tenderness Genito Urinary  Vulva/vagina: Normal external female genitalia.  No lesions seen.  5% acetic acid applied to the vulva and close inspection was performed of all vulvar folds including the hairbearing areas.  Still no lesions were visualized or palpated. No discharge or bleeding. Rectal  Good tone, no masses no cul de sac nodularity.  Extremities  No bilateral cyanosis, clubbing or edema.  60 minutes of total time was spent for this patient encounter, including preparation, face-to-face counseling with the patient and coordination of care, review of imaging (results and images), communication with the referring provider and documentation of the encounter.   Thereasa Solo, MD  05/11/2020, 9:41 AM

## 2020-05-16 ENCOUNTER — Ambulatory Visit (AMBULATORY_SURGERY_CENTER): Payer: Self-pay | Admitting: *Deleted

## 2020-05-16 ENCOUNTER — Other Ambulatory Visit: Payer: Self-pay

## 2020-05-16 VITALS — Ht 64.0 in | Wt 188.0 lb

## 2020-05-16 DIAGNOSIS — Z86010 Personal history of colonic polyps: Secondary | ICD-10-CM

## 2020-05-16 NOTE — Progress Notes (Signed)
cov vax x 2    No egg or soy allergy known to patient  No issues with past sedation with any surgeries or procedures no intubation problems in the past  No FH of Malignant Hyperthermia No diet pills per patient No home 02 use per patient  No blood thinners per patient  Pt denies issues with constipation  No A fib or A flutter  EMMI video to pt or via [place] 19 guidelines implemented in PV today with Pt and RN   Due to the COVID-19 pandemic we are asking patients to follow these guidelines. Please only bring one care partner. Please be aware that your care partner may wait in the car in the parking lot or if they feel like they will be too hot to wait in the car, they may wait in the lobby on the 4th floor. All care partners are required to wear a mask the entire time (we do not have any that we can provide them), they need to practice social distancing, and we will do a Covid check for all patient's and care partners when you arrive. Also we will check their temperature and your temperature. If the care partner waits in their car they need to stay in the parking lot the entire time and we will call them on their cell phone when the patient is ready for discharge so they can bring the car to the front of the building. Also all patient's will need to wear a mask into building.

## 2020-05-17 DIAGNOSIS — Z23 Encounter for immunization: Secondary | ICD-10-CM | POA: Diagnosis not present

## 2020-05-30 ENCOUNTER — Ambulatory Visit (AMBULATORY_SURGERY_CENTER): Payer: Medicare Other | Admitting: Internal Medicine

## 2020-05-30 ENCOUNTER — Other Ambulatory Visit: Payer: Self-pay

## 2020-05-30 ENCOUNTER — Encounter: Payer: Self-pay | Admitting: Internal Medicine

## 2020-05-30 VITALS — BP 103/66 | HR 62 | Temp 97.1°F | Resp 14 | Ht 64.0 in | Wt 188.0 lb

## 2020-05-30 DIAGNOSIS — D125 Benign neoplasm of sigmoid colon: Secondary | ICD-10-CM | POA: Diagnosis not present

## 2020-05-30 DIAGNOSIS — D122 Benign neoplasm of ascending colon: Secondary | ICD-10-CM | POA: Diagnosis not present

## 2020-05-30 DIAGNOSIS — Z860100 Personal history of colon polyps, unspecified: Secondary | ICD-10-CM

## 2020-05-30 DIAGNOSIS — D123 Benign neoplasm of transverse colon: Secondary | ICD-10-CM | POA: Diagnosis not present

## 2020-05-30 DIAGNOSIS — Z1211 Encounter for screening for malignant neoplasm of colon: Secondary | ICD-10-CM | POA: Diagnosis not present

## 2020-05-30 MED ORDER — SODIUM CHLORIDE 0.9 % IV SOLN: 500.0000 mL | Freq: Once | INTRAVENOUS | Status: DC

## 2020-05-30 NOTE — Op Note (Signed)
[facility] Patient Name: Karen Barrera Procedure Date: 05/30/2020 7:54 AM MRN: [id number] Endoscopist: Gatha Mayer , MD Age: 70 Referring MD:  Date of Birth: [birth year] Gender: Female Account #: [id number] Procedure:                Colonoscopy Indications:              Surveillance: Personal history of adenomatous                            polyps on last colonoscopy 5 years ago Medicines:                Monitored Anesthesia Care Procedure:                Pre-Anesthesia Assessment:                           - Prior to the procedure, a History and Physical                            was performed, and patient medications and                            allergies were reviewed. The patient's tolerance of                            previous anesthesia was also reviewed. The risks                            and benefits of the procedure and the sedation                            options and risks were discussed with the patient.                            All questions were answered, and informed consent                            was obtained. Prior Anticoagulants: The patient has                            taken no previous anticoagulant or antiplatelet                            agents. ASA Grade Assessment: III - A patient with                            severe systemic disease. After reviewing the risks                            and benefits, the patient was deemed in                            satisfactory condition to undergo the procedure.  After obtaining informed consent, the colonoscope                            was passed under direct vision. Throughout the                            procedure, the patient's blood pressure, pulse, and                            oxygen saturations were monitored continuously. The                            Colonoscope was introduced through the anus and                            advanced to the the  cecum, identified by                            appendiceal orifice and ileocecal valve. The                            colonoscopy was somewhat difficult due to                            significant looping. Successful completion of the                            procedure was aided by using manual pressure. The                            patient tolerated the procedure well. The quality                            of the bowel preparation was excellent. The                            ileocecal valve, appendiceal orifice, and rectum                            were photographed. Scope In: 8:17:58 AM Scope Out: 8:43:39 AM Scope Withdrawal Time: 0 hours 14 minutes 6 seconds  Total Procedure Duration: 0 hours 25 minutes 41 seconds  Findings:                 The perianal and digital rectal examinations were                            normal.                           Five sessile polyps were found in the sigmoid                            colon, transverse colon and ascending colon. The  polyps were 3 to 8 mm in size. These polyps were                            removed with a cold snare. Resection and retrieval                            were complete. Verification of patient                            identification for the specimen was done. Estimated                            blood loss was minimal.                           Anal papilla(e) were hypertrophied.                           The exam was otherwise without abnormality on                            direct and retroflexion views. Complications:            No immediate complications. Estimated Blood Loss:     Estimated blood loss was minimal. Impression:               - Five 3 to 8 mm polyps in the sigmoid colon, in                            the transverse colon and in the ascending colon,                            removed with a cold snare. Resected and retrieved.                           - The  entire examined colon is normal on direct and                            retroflexion views.                           - Personal history of colonic polyps. adenomas                            since 2013 - 2 diminutive last exam 2016 Recommendation:           - Patient has a contact number available for                            emergencies. The signs and symptoms of potential                            delayed complications were discussed with the  patient. Return to normal activities tomorrow.                            Written discharge instructions were provided to the                            patient.                           - Repeat colonoscopy is recommended for                            surveillance. The colonoscopy date will be                            determined after pathology results from today's                            exam become available for review. Gatha Mayer, MD 05/30/2020 9:00:26 AM This report has been signed electronically.

## 2020-05-30 NOTE — Progress Notes (Signed)
Called to room to assist during endoscopic procedure.  Patient ID and intended procedure confirmed with present staff. Received instructions for my participation in the procedure from the performing physician.  

## 2020-05-30 NOTE — Patient Instructions (Addendum)
I found and removed 5 small polyps.  All looked benign.  They will be analyzed and I will let you know the results and plans for timing of next colonoscopy though I suspect it will be in about 3 years.   I appreciate the opportunity to care for you. Gatha Mayer, MD, [facility]  Please read handouts provided. Await pathology results.   YOU HAD AN ENDOSCOPIC PROCEDURE TODAY AT [facility]:   Refer to the procedure report that was given to you for any specific questions about what was found during the examination.  If the procedure report does not answer your questions, please call your gastroenterologist to clarify.  If you requested that your care partner not be given the details of your procedure findings, then the procedure report has been included in a sealed envelope for you to review at your convenience later.  YOU SHOULD EXPECT: Some feelings of bloating in the abdomen. Passage of more gas than usual.  Walking can help get rid of the air that was put into your GI tract during the procedure and reduce the bloating. If you had a lower endoscopy (such as a colonoscopy or flexible sigmoidoscopy) you may notice spotting of blood in your stool or on the toilet paper. If you underwent a bowel prep for your procedure, you may not have a normal bowel movement for a few days.  Please Note:  You might notice some irritation and congestion in your nose or some drainage.  This is from the oxygen used during your procedure.  There is no need for concern and it should clear up in a day or so.  SYMPTOMS TO REPORT IMMEDIATELY:   Following lower endoscopy (colonoscopy or flexible sigmoidoscopy):  Excessive amounts of blood in the stool  Significant tenderness or worsening of abdominal pains  Swelling of the abdomen that is new, acute  Fever of 100F or higher    For urgent or emergent issues, a gastroenterologist can be reached at any hour by calling [phone]. Do not use MyChart  messaging for urgent concerns.    DIET:  We do recommend a small meal at first, but then you may proceed to your regular diet.  Drink plenty of fluids but you should avoid alcoholic beverages for 24 hours.  ACTIVITY:  You should plan to take it easy for the rest of today and you should NOT DRIVE or use heavy machinery until tomorrow (because of the sedation medicines used during the test).    FOLLOW UP: Our staff will call the number listed on your records 48-72 hours following your procedure to check on you and address any questions or concerns that you may have regarding the information given to you following your procedure. If we do not reach you, we will leave a message.  We will attempt to reach you two times.  During this call, we will ask if you have developed any symptoms of COVID 19. If you develop any symptoms (ie: fever, flu-like symptoms, shortness of breath, cough etc.) before then, please call [phone].  If you test positive for Covid 19 in the 2 weeks post procedure, please call and report this information to [place].    If any biopsies were taken you will be contacted by phone or by letter within the next 1-3 weeks.  Please call us at [phone] if you have not heard about the biopsies in 3 weeks.    SIGNATURES/CONFIDENTIALITY: You and/or your care partner have signed  paperwork which will be entered into your electronic medical record.  These signatures attest to the fact that that the information above on your After Visit Summary has been reviewed and is understood.  Full responsibility of the confidentiality of this discharge information lies with you and/or your care-partner.

## 2020-05-30 NOTE — Progress Notes (Signed)
A/ox3, pleased with MAC, report to RN 

## 2020-05-30 NOTE — Progress Notes (Signed)
Vitals by NS.

## 2020-06-01 ENCOUNTER — Telehealth: Payer: Self-pay | Admitting: *Deleted

## 2020-06-01 NOTE — Telephone Encounter (Signed)
  Follow up Call-  Call back number 05/30/2020  Post procedure Call Back phone  # [phone]  Permission to leave phone message Yes  Some recent data might be hidden     Patient questions:  Do you have a fever, pain , or abdominal swelling? No. Pain Score  0 *  Have you tolerated food without any problems? Yes.    Have you been able to return to your normal activities? Yes.    Do you have any questions about your discharge instructions: Diet   No. Medications  No. Follow up visit  No.  Do you have questions or concerns about your Care? No.  Actions: * If pain score is 4 or above: 1. No action needed, pain <4.Have you developed a fever since your procedure? no  2.   Have you had an respiratory symptoms (SOB or cough) since your procedure? no  3.   Have you tested positive for COVID 19 since your procedure no  4.   Have you had any family members/close contacts diagnosed with the COVID 19 since your procedure?  no   If yes to any of these questions please route to Joylene John, RN and Joella Prince, RN

## 2020-06-10 ENCOUNTER — Encounter: Payer: Self-pay | Admitting: Internal Medicine

## 2020-06-10 DIAGNOSIS — Z86010 Personal history of colonic polyps: Secondary | ICD-10-CM

## 2020-06-26 ENCOUNTER — Other Ambulatory Visit: Payer: Self-pay | Admitting: Surgery

## 2020-06-26 DIAGNOSIS — N6489 Other specified disorders of breast: Secondary | ICD-10-CM

## 2020-07-03 DIAGNOSIS — Z23 Encounter for immunization: Secondary | ICD-10-CM | POA: Diagnosis not present

## 2020-07-23 NOTE — Progress Notes (Addendum)
Your procedure is scheduled on Wednesday, August 01, 2020.  Report to [facility] Main Entrance "A" at 8:00 A.M., and check in at the Admitting office.  Call this number if you have problems the morning of surgery:  [phone]  Call [phone] if you have any questions prior to your surgery date Monday-Friday 8am-4pm    Remember:  Do not eat after midnight the night before your surgery  You may drink clear liquids until 7:00 AM the morning of your surgery.   Clear liquids allowed are: Water, Non-Citrus Juices (without pulp), Carbonated Beverages, Clear Tea, Black Coffee Only, and Gatorade  Please complete your 10 oz WATER that was provided to you by 7:00 AM the morning of surgery.  Please, if able, drink it in one setting. DO NOT SIP.    Take these medicines the morning of surgery with A SIP OF WATER:  cetirizine (ZYRTEC)  diphenhydrAMINE (BENADRYL)  metoprolol succinate (TOPROL-XL)  montelukast (SINGULAIR) rosuvastatin (CRESTOR) sertraline (ZOLOFT)  IF NEEDED: albuterol (VENTOLIN HFA) albuterol (PROVENTIL) omeprazole (PRILOSEC)   Please bring all inhalers with you the day of surgery.   Follow your surgeon's instructions on when to stop Aspirin.  If no instructions were given by your surgeon then you will need to call the office to get those instructions.    As of today, STOP taking any Aleve, Naproxen, Ibuprofen, Motrin, Advil, Goody's, BC's, all herbal medications, fish oil, and all vitamins.                      Do not wear jewelry, make up, or nail polish            Do not wear lotions, powders, perfumes, or deodorant.            Do not shave 48 hours prior to surgery.            Do not bring valuables to the hospital.            [facility] is not responsible for any belongings or valuables.  Do NOT Smoke (Tobacco/Vaping) or drink Alcohol 24 hours prior to your procedure If you use a CPAP at night, you may bring all equipment for your overnight stay.   Contacts,  glasses, dentures or bridgework may not be worn into surgery.      For patients admitted to the hospital, discharge time will be determined by your treatment team.   Patients discharged the day of surgery will not be allowed to drive home, and someone needs to stay with them for 24 hours.    Special instructions:   [place]- Preparing For Surgery  Before surgery, you can play an important role. Because skin is not sterile, your skin needs to be as free of germs as possible. You can reduce the number of germs on your skin by washing with CHG (chlorahexidine gluconate) Soap before surgery.  CHG is an antiseptic cleaner which kills germs and bonds with the skin to continue killing germs even after washing.    Oral Hygiene is also important to reduce your risk of infection.  Remember - BRUSH YOUR TEETH THE MORNING OF SURGERY WITH YOUR REGULAR TOOTHPASTE  Please do not use if you have an allergy to CHG or antibacterial soaps. If your skin becomes reddened/irritated stop using the CHG.  Do not shave (including legs and underarms) for at least 48 hours prior to first CHG shower. It is OK to shave your face.  Please follow these  instructions carefully.   1. Shower the NIGHT BEFORE SURGERY and the MORNING OF SURGERY with CHG Soap.   2. If you chose to wash your hair, wash your hair first as usual with your normal shampoo.  3. After you shampoo, rinse your hair and body thoroughly to remove the shampoo.  4. Use CHG as you would any other liquid soap. You can apply CHG directly to the skin and wash gently with a scrungie or a clean washcloth.   5. Apply the CHG Soap to your body ONLY FROM THE NECK DOWN.  Do not use on open wounds or open sores. Avoid contact with your eyes, ears, mouth and genitals (private parts). Wash Face and genitals (private parts)  with your normal soap.   6. Wash thoroughly, paying special attention to the area where your surgery will be performed.  7. Thoroughly rinse  your body with warm water from the neck down.  8. DO NOT shower/wash with your normal soap after using and rinsing off the CHG Soap.  9. Pat yourself dry with a CLEAN TOWEL.  10. Wear CLEAN PAJAMAS to bed the night before surgery  11. Place CLEAN SHEETS on your bed the night of your first shower and DO NOT SLEEP WITH PETS.   Day of Surgery: Wear Clean/Comfortable clothing the morning of surgery Do not apply any deodorants/lotions.   Remember to brush your teeth WITH YOUR REGULAR TOOTHPASTE.   Please read over the following fact sheets that you were given.

## 2020-07-24 ENCOUNTER — Encounter (HOSPITAL_COMMUNITY)
Admission: RE | Admit: 2020-07-24 | Discharge: 2020-07-24 | Disposition: A | Discharge status: Home / Self Care | Payer: Medicare Other | Source: Ambulatory Visit | Attending: Surgery | Admitting: Surgery

## 2020-07-24 ENCOUNTER — Encounter (HOSPITAL_COMMUNITY): Payer: Self-pay

## 2020-07-24 ENCOUNTER — Other Ambulatory Visit: Payer: Self-pay

## 2020-07-24 DIAGNOSIS — I2510 Atherosclerotic heart disease of native coronary artery without angina pectoris: Secondary | ICD-10-CM | POA: Diagnosis not present

## 2020-07-24 DIAGNOSIS — E119 Type 2 diabetes mellitus without complications: Secondary | ICD-10-CM | POA: Diagnosis not present

## 2020-07-24 DIAGNOSIS — Z85118 Personal history of other malignant neoplasm of bronchus and lung: Secondary | ICD-10-CM | POA: Diagnosis not present

## 2020-07-24 DIAGNOSIS — E669 Obesity, unspecified: Secondary | ICD-10-CM | POA: Diagnosis not present

## 2020-07-24 DIAGNOSIS — Z6831 Body mass index (BMI) 31.0-31.9, adult: Secondary | ICD-10-CM | POA: Insufficient documentation

## 2020-07-24 DIAGNOSIS — Z9079 Acquired absence of other genital organ(s): Secondary | ICD-10-CM | POA: Diagnosis not present

## 2020-07-24 DIAGNOSIS — Z7982 Long term (current) use of aspirin: Secondary | ICD-10-CM | POA: Diagnosis not present

## 2020-07-24 DIAGNOSIS — N6489 Other specified disorders of breast: Secondary | ICD-10-CM | POA: Insufficient documentation

## 2020-07-24 DIAGNOSIS — E785 Hyperlipidemia, unspecified: Secondary | ICD-10-CM | POA: Insufficient documentation

## 2020-07-24 DIAGNOSIS — Z79899 Other long term (current) drug therapy: Secondary | ICD-10-CM | POA: Insufficient documentation

## 2020-07-24 DIAGNOSIS — Z87891 Personal history of nicotine dependence: Secondary | ICD-10-CM | POA: Insufficient documentation

## 2020-07-24 DIAGNOSIS — J42 Unspecified chronic bronchitis: Secondary | ICD-10-CM | POA: Insufficient documentation

## 2020-07-24 DIAGNOSIS — K219 Gastro-esophageal reflux disease without esophagitis: Secondary | ICD-10-CM | POA: Insufficient documentation

## 2020-07-24 DIAGNOSIS — Z791 Long term (current) use of non-steroidal anti-inflammatories (NSAID): Secondary | ICD-10-CM | POA: Insufficient documentation

## 2020-07-24 DIAGNOSIS — D649 Anemia, unspecified: Secondary | ICD-10-CM | POA: Diagnosis not present

## 2020-07-24 DIAGNOSIS — Z01818 Encounter for other preprocedural examination: Secondary | ICD-10-CM | POA: Insufficient documentation

## 2020-07-24 LAB — SURGICAL PCR SCREEN
MRSA, PCR: NEGATIVE
Staphylococcus aureus: POSITIVE — AB

## 2020-07-24 LAB — CBC
HCT: 43.9 % (ref 36.0–46.0)
Hemoglobin: 14 g/dL (ref 12.0–15.0)
MCH: 28.9 pg (ref 26.0–34.0)
MCHC: 31.9 g/dL (ref 30.0–36.0)
MCV: 90.5 fL (ref 80.0–100.0)
Platelets: 194 10*3/uL (ref 150–400)
RBC: 4.85 MIL/uL (ref 3.87–5.11)
RDW: 13.1 % (ref 11.5–15.5)
WBC: 6.8 10*3/uL (ref 4.0–10.5)
nRBC: 0 % (ref 0.0–0.2)

## 2020-07-24 LAB — BASIC METABOLIC PANEL
Anion gap: 13 (ref 5–15)
BUN: 12 mg/dL (ref 8–23)
CO2: 21 mmol/L — ABNORMAL LOW (ref 22–32)
Calcium: 9.4 mg/dL (ref 8.9–10.3)
Chloride: 104 mmol/L (ref 98–111)
Creatinine, Ser: 0.99 mg/dL (ref 0.44–1.00)
GFR, Estimated: >60 mL/min (ref >60)
Glucose, Bld: 115 mg/dL — ABNORMAL HIGH (ref 70–99)
Potassium: 4.3 mmol/L (ref 3.5–5.1)
Sodium: 138 mmol/L (ref 135–145)

## 2020-07-24 LAB — HEMOGLOBIN A1C
Hgb A1c MFr Bld: 6 % — ABNORMAL HIGH (ref 4.8–5.6)
Mean Plasma Glucose: 125.5 mg/dL

## 2020-07-24 LAB — GLUCOSE, CAPILLARY: Glucose-Capillary: 100 mg/dL — ABNORMAL HIGH (ref 70–99)

## 2020-07-24 NOTE — Anesthesia Preprocedure Evaluation (Addendum)
Anesthesia Evaluation  Patient identified by MRN, date of birth, ID band Patient awake    Reviewed: Allergy & Precautions, NPO status , Patient's Chart, lab work & pertinent test results  Airway Mallampati: II  TM Distance: >3 FB Neck ROM: Full    Dental  (+) Dental Advisory Given   Pulmonary COPD, former smoker,  Lung CA s/p VATS and wedge.   breath sounds clear to auscultation       Cardiovascular + CAD   Rhythm:Regular Rate:Normal     Neuro/Psych  Headaches, Seizures -,     GI/Hepatic Neg liver ROS, GERD  ,  Endo/Other  negative endocrine ROS  Renal/GU Renal disease     Musculoskeletal  (+) Arthritis ,   Abdominal   Peds  Hematology negative hematology ROS (+)   Anesthesia Other Findings   Reproductive/Obstetrics                            Lab Results  Component Value Date   WBC 6.8 07/24/2020   HGB 14.0 07/24/2020   HCT 43.9 07/24/2020   MCV 90.5 07/24/2020   PLT 194 07/24/2020   Lab Results  Component Value Date   CREATININE 0.99 07/24/2020   BUN 12 07/24/2020   NA 138 07/24/2020   K 4.3 07/24/2020   CL 104 07/24/2020   CO2 21 (L) 07/24/2020    Anesthesia Physical Anesthesia Plan  ASA: III  Anesthesia Plan: General   Post-op Pain Management:    Induction: Intravenous  PONV Risk Score and Plan: 3 and Dexamethasone, Ondansetron and Treatment may vary due to age or medical condition  Airway Management Planned: LMA  Additional Equipment:   Intra-op Plan:   Post-operative Plan: Extubation in OR  Informed Consent: I have reviewed the patients History and Physical, chart, labs and discussed the procedure including the risks, benefits and alternatives for the proposed anesthesia with the patient or authorized representative who has indicated his/her understanding and acceptance.     Dental advisory given  Plan Discussed with:   Anesthesia Plan Comments: (PAT  note written 07/24/2020 by Myra Gianotti, PA-C. )       Anesthesia Quick Evaluation

## 2020-07-24 NOTE — Progress Notes (Signed)
PCP - Dr. [name] Cardiologist - Dr. Shelva Majestic  PPM/ICD - denies  Chest x-ray - N/A EKG - 07/24/2020 Stress Test - denies ECHO - 07/14/18 Cardiac Cath - 09/06/2018  Sleep Study - denies CPAP - N/A  DM: patient denies, last A1c prior to PAT visit: 6.2 on 03/23/2020 CBG at PAT visit: 100  Blood Thinner Instructions: N/A Aspirin Instructions: patient instructed to call surgeon's office for instructions on when to stop taking aspirin for surgery  ERAS Protcol - Yes PRE-SURGERY Ensure or G2- 10oz bottle of water given  COVID TEST- Scheduled for 07/30/20. Patient verbalized understanding of self-quarantine instructions, appointment time and place.  Anesthesia review: YES, cardiac hx, CAD hx, seed placement appointment scheduled for 07/31/20.  Patient denies shortness of breath, fever, cough and chest pain at PAT appointment  All instructions explained to the patient, with a verbal understanding of the material. Patient agrees to go over the instructions while at home for a better understanding. Patient also instructed to self quarantine after being tested for COVID-19. The opportunity to ask questions was provided.

## 2020-07-24 NOTE — Progress Notes (Signed)
Anesthesia Chart Review:  Case: [id number] Date/Time: 08/01/20 0945   Procedure: RIGHT BREAST LUMPECTOMY WITH RADIOACTIVE SEED LOCALIZATION (Right Breast) - LMA   Anesthesia type: General   Pre-op diagnosis: COMPLEX SCLEROSING LESION RIGHT BREAST   Location: [place] OR ROOM 09 / [place] OR   Surgeons: Karen Keens, MD      DISCUSSION: Patient is a 69 year old female (will be 68 years old on 10-22-2050) scheduled for the above procedure.   History includes former smoker (quit 09/27/18), CAD (20-30% RCA, 20% CX/OM1, 55% LAD, medical therapy 08/2018), lung cancer (s/p left VATS/LU lobectomy 09/28/18 for Stage 1 non-small cell lung cancer), HLD, pre-diabetes, COPD/chronic bronchitis, GERD, seizure (~ 1962 in setting of abscessed tooth), anemia, right ganglion cyst (s/p cyst excision 02/21/20). Reports that her mother is sensitive to anesthesia ("a little goes a long way").  BMI is consistent with obesity.  She was doing well at last cardiology and pulmonology visits as outlined below. She denied SOB, cough, fever, chest pain at PAT RN visit. EKG showed SB at 59 bpm, appears stable when compared to prior tracing. She stopped smoking in 2020.   Presurgical COVID-19 test is scheduled for 07/30/2020.  RSL is scheduled for 07/31/2020.  Anesthesia team to evaluate on the day of surgery.  VS: BP (!) 152/77   Pulse 63   Temp (!) 36.4 C (Oral)   Resp 18   Ht 5\' 4"  (1.626 m)   Wt 83.1 kg   SpO2 96%   BMI 31.45 kg/m    PROVIDERS: [name], MD is PCP  - Shelva Majestic, MD is cardiologist. Last visit 08/01/19.  She was doing well at that visit without anginal symptoms, BP controlled.  She was also on aspirin and statin therapy.  1 year follow-up planned. Baird Lyons, MD is pulmonologist. Last visit 10/18/19.  COPD stable at that time. Lanelle Bal, MD is CT surgeon. Last visit 04/26/20.  No recurrence of lung cancer on CT imaging. Repeat imaging ~ 09/2020 planned.  Everitt Amber, MD is GYN-ONC. 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  No residual disease noted after previous punch biopsies.  Continued close surveillance recommended.   LABS: Labs reviewed: Acceptable for surgery. .  (all labs ordered are listed, but only abnormal results are displayed)  Labs Reviewed  SURGICAL PCR SCREEN - Abnormal; Notable for the following components:      Result Value   Staphylococcus aureus POSITIVE (*)    All other components within normal limits  HEMOGLOBIN A1C - Abnormal; Notable for the following components:   Hgb A1c MFr Bld 6.0 (*)    All other components within normal limits  BASIC METABOLIC PANEL - Abnormal; Notable for the following components:   CO2 21 (*)    Glucose, Bld 115 (*)    All other components within normal limits  GLUCOSE, CAPILLARY - Abnormal; Notable for the following components:   Glucose-Capillary 100 (*)    All other components within normal limits  CBC    PFTs 09/21/18 (pre-LU lobectomy): FVC 3.63   117% FEV1 2.56 108% DLCO 19.07   96%   IMAGES: CT Chest 10/20/19: IMPRESSION: 1. Postsurgical change compatible previous left upper lobectomy for lung cancer. Three tiny stable nodule opacities in the left lung as described likely benign. Recommend attention on follow-up. No adenopathy. 2. Diffuse sclerosis of the C6 and C7 vertebral bodies without significant change with sclerosis over the right pedicle/lamina and transverse process of T1. These areas were not hypermetabolic  on previous PET-CT, although metastatic disease is still possible. Consider bone scan for further evaluation. 3. Aortic Atherosclerosis (ICD10-I70.0). Atherosclerotic coronary artery disease. 4.  3.8 cm right liver cyst.  Moderate attic steatosis. 5.  Small stable left adrenal adenoma.   EKG: 07/24/20: SB at 59 bpm - Anterior T wave abnormality is less prominent when compared to 08/01/19 tracing.    CV: Cardiac cath 09/06/18:  Prox RCA lesion is 30% stenosed.  Mid RCA lesion is  20% stenosed.  Prox Cx lesion is 20% stenosed.  Ost 1st Mrg lesion is 20% stenosed.  Ost LAD to Prox LAD lesion is 20% stenosed.  Prox LAD to Mid LAD lesion is 30% stenosed.  Mid LAD lesion is 55% stenosed. - There is evidence for moderate coronary calcification without high-grade obstructive CAD. There is 20 to 30% proximal to mid LAD stenoses with 55% mid stenosis in the mid vessel beyond a small diagonal vessel; more significant calcification involving the proximal left circumflex coronary artery with 20% proximal narrowing and 20% narrowing in the OM1 vessel; and 30 and 20% proximal to mid RCA stenoses. - Normal LV function without focal segmental wall motion abnormalities. LVEDP 18 mm; however, the patient's blood pressure was low prior to the procedure and she had received a 250 cc normal saline bolus. RECOMMENDATION: Initial medical therapy. With multivessel coronary calcification, recommend high potency statin therapy and will titrate rosuvastatin to 40 mg. Patient had been started on Toprol-XL 25 mg. Depending upon blood pressure, additional medical therapy can be added as needed. Recommend aspirin 81 mg. Smoking cessation is essential.  CT coronary FFR 08/13/18: FINDINGS: FFR 0.76 mid LAD. IMPRESSION: Hemodynamically significant proximal LAD stenosis.  Echo 07/14/18: Study Conclusions - Left ventricle: The cavity size was normal. Wall thickness was normal. Systolic function was normal. The estimated ejection fraction was in the range of 55% to 60%. Wall motion was normal; there were no regional wall motion abnormalities. Doppler parameters are consistent with abnormal left ventricular relaxation (grade 1 diastolic dysfunction). - Aortic valve: There was no stenosis. - Aorta: Mildly dilated aortic root. Aortic root dimension: 40 mm (ED). - Mitral valve: There was no significant regurgitation. - Right ventricle: The cavity size was normal. Systolic  function was normal. - Tricuspid valve: Peak RV-RA gradient (S): 17 mm Hg. - Pulmonary arteries: PA peak pressure: 20 mm Hg (S). - Inferior vena cava: The vessel was normal in size. The respirophasic diameter changes were in the normal range (>= 50%), consistent with normal central venous pressure. Impressions: - Normal LV size with EF 55-60%. Normal RV size and systolic function. No significant valvular abnormalities.   Past Medical History:  Diagnosis Date  . Acute perforated appendicitis 10/31/2014  . Adenomatous colon polyp   . Allergic rhinitis   . Allergy   . Anemia   . Arthritis    "fingers" (10/31/2014)  . Chronic bronchitis ([place])    "got it q yr til I had my nose OR" (10/31/2014)  . COPD (chronic obstructive pulmonary disease) ([place])   . Coronary artery disease   . DDD (degenerative disc disease), cervical   . DDD (degenerative disc disease), lumbar   . Depression    "short term when my sister passed away unexpectedly"  . Family history of adverse reaction to anesthesia    "mother is hard to wake up; a little goes a long way w/her"  . Family history of breast cancer   . Family history of colon cancer   . Family  history of skin cancer   . GERD (gastroesophageal reflux disease)   . History of stomach ulcers   . Hyperlipidemia    "borderline; RX is preventative" (10/31/2014)  . Kidney stones    "they passed"  . Lung cancer ([place]) 09/28/2018  . Migraine    "used to get them alot; don't get them anymore" (10/31/2014)  . Osteoarthritis of both knees   . Osteopenia   . Pneumonia 2003 X 1  . Pre-diabetes   . Seizures ([place]) ~ 1962 X 1   "from an abscessed wisdom tooth"  . Tobacco dependence   . Vitamin D deficiency     Past Surgical History:  Procedure Laterality Date  . APPENDECTOMY    . ARTHROTOMY Right 01/11/2016   Procedure: DISTAL INTERPHALANGEAL JOINT ARTHROTOMY RIGHT INDEX FINGER;  Surgeon: Leanora Cover, MD;  Location: [facility];  Service:  Orthopedics;  Laterality: Right;  . COLONOSCOPY    . CYST EXCISION Right 01/11/2016   Procedure: EXCISION MUCOID CYST;  Surgeon: Leanora Cover, MD;  Location: [facility];  Service: Orthopedics;  Laterality: Right;  . GANGLION CYST EXCISION Right 02/21/2020   Procedure: REMOVAL GANGLION OF WRIST;  Surgeon: Leanora Cover, MD;  Location: [facility];  Service: Orthopedics;  Laterality: Right;  . LAPAROSCOPIC APPENDECTOMY N/A 10/31/2014   Procedure: APPENDECTOMY LAPAROSCOPIC;  Surgeon: Alphonsa Overall, MD;  Location: [facility];  Service: General;  Laterality: N/A;  . LEFT HEART CATH AND CORONARY ANGIOGRAPHY N/A 09/06/2018   Procedure: LEFT HEART CATH AND CORONARY ANGIOGRAPHY;  Surgeon: Troy Sine, MD;  Location: [facility] [facility] CV LAB;  Service: Cardiovascular;  Laterality: N/A;  . POLYPECTOMY    . SALPINGOOPHORECTOMY Right 1999   "w/grapefruit-sized polyp"  . SHOULDER ARTHROSCOPY Right 2003   removal spurs  . SHOULDER ARTHROSCOPY W/ ROTATOR CUFF REPAIR Left 2012  . SINUS EXPLORATION  12/2013   [facility]  . TENDON REPAIR Left 10/2009   torn tendon @ elbow  . VAGINAL HYSTERECTOMY  1978  . VIDEO ASSISTED THORACOSCOPY (VATS)/WEDGE RESECTION Left 09/28/2018   Procedure: LEFT VIDEO ASSISTED THORACOSCOPY (VATS)/Completion Left Upper lobe Lobectomy. Lymph node dissection. Intercostal nerve block.;  Surgeon: Grace Isaac, MD;  Location: [facility];  Service: Thoracic;  Laterality: Left;  Marland Kitchen VIDEO BRONCHOSCOPY N/A 09/28/2018   Procedure: VIDEO BRONCHOSCOPY;  Surgeon: Grace Isaac, MD;  Location[facility] OR;  Service: Thoracic;  Laterality: N/A;    MEDICATIONS: . albuterol (PROVENTIL) (2.5 MG/3ML) 0.083% nebulizer solution  . albuterol (VENTOLIN HFA) 108 (90 Base) MCG/ACT inhaler  . aspirin EC 81 MG tablet  . Calcium Carbonate Antacid (CALCIUM CARBONATE PO)  . cetirizine (ZYRTEC) 10 MG tablet  . Cholecalciferol (VITAMIN D-3) 125 MCG (5000 UT) TABS  . diphenhydrAMINE (BENADRYL) 25 MG  tablet  . ibuprofen (ADVIL,MOTRIN) 200 MG tablet  . metoprolol succinate (TOPROL-XL) 25 MG 24 hr tablet  . montelukast (SINGULAIR) 10 MG tablet  . omeprazole (PRILOSEC) 40 MG capsule  . rosuvastatin (CRESTOR) 40 MG tablet  . sertraline (ZOLOFT) 100 MG tablet  . zinc gluconate 50 MG tablet   No current facility-administered medications for this encounter.    Myra Gianotti, PA-C Surgical Short Stay/Anesthesiology [facility] Phone [phone] [facility] Phone [phone] 07/24/2020 2:57 PM

## 2020-07-30 ENCOUNTER — Other Ambulatory Visit (HOSPITAL_COMMUNITY)
Admission: RE | Admit: 2020-07-30 | Discharge: 2020-07-30 | Disposition: A | Discharge status: Home / Self Care | Payer: Medicare Other | Source: Ambulatory Visit | Attending: Surgery | Admitting: Surgery

## 2020-07-30 DIAGNOSIS — Z20822 Contact with and (suspected) exposure to covid-19: Secondary | ICD-10-CM | POA: Diagnosis not present

## 2020-07-30 DIAGNOSIS — Z01812 Encounter for preprocedural laboratory examination: Secondary | ICD-10-CM | POA: Insufficient documentation

## 2020-07-30 LAB — SARS CORONAVIRUS 2 (TAT 6-24 HRS): SARS Coronavirus 2: NEGATIVE

## 2020-07-31 ENCOUNTER — Other Ambulatory Visit: Payer: Self-pay

## 2020-07-31 ENCOUNTER — Ambulatory Visit
Admission: RE | Admit: 2020-07-31 | Discharge: 2020-07-31 | Disposition: A | Discharge status: Home / Self Care | Payer: Medicare Other | Source: Ambulatory Visit | Attending: Surgery | Admitting: Surgery

## 2020-07-31 DIAGNOSIS — N6489 Other specified disorders of breast: Secondary | ICD-10-CM

## 2020-07-31 DIAGNOSIS — R928 Other abnormal and inconclusive findings on diagnostic imaging of breast: Secondary | ICD-10-CM | POA: Diagnosis not present

## 2020-07-31 NOTE — H&P (Signed)
[place]  Location: [facility] Patient #: [id number] DOB: Jul 25, 1951 Married / Language: English / Race: White Female   History of Present Illness ( The patient is a 70 year old female who presents with a complaint of Breast problems.  Chief complaint: Complex sclerosing lesion right breast  This is a 70 year old female who had gone for her screening mammography. A small, abnormal, 0.6 x 0.7 x 0.3 lesion was seen at the 1 o'clock position of the right breast near the nipple areolar complex. A biopsy of this was performed showing a complex sclerosing lesion. She denies nipple discharge. She has no previous history of breast surgery or biopsies. She has a family history of breast cancer remotely in a grandmother. She is otherwise without complaints.   Past Surgical History (Chanel Teressa Senter, CMA; Appendectomy  Breast Biopsy  Right. Colon Polyp Removal - Colonoscopy  Hysterectomy (not due to cancer) - Complete  Lung Surgery  Left. Shoulder Surgery  Left, Right.  Diagnostic Studies History (Chanel Teressa Senter, CMA;  Colonoscopy  1-5 years ago Mammogram  within last year Pap Smear  1-5 years ago  Allergies (Chanel Teressa Senter, CMA;  Avelox *FLUOROQUINOLONES*  Chantix *PSYCHOTHERAPEUTIC AND NEUROLOGICAL AGENTS - MISC.*  Allergies Reconciled   Medication History (Chanel Teressa Senter, CMA;  ProAir HFA (108 (90 Base)MCG/ACT Aerosol Soln, Inhalation as needed) Active. Aspirin (81MG  Tablet, Oral daily) Active. Wellbutrin XL (150MG  Tablet ER 24HR, Oral d.) Active. B Complex (Oral daily) Active. RA Vitamin D-3 (5000UNIT Capsule, Oral daily) Active. Co-Enzyme Q-10 (30MG  Capsule, Oral daily) Active. Co-Enzyme Q10 (200MG  Capsule, Oral daily) Active. Crestor (10MG  Tablet, Oral daily) Active. Benadryl Allergy (25MG  Tablet, Oral daily) Active. EPINEPHrine (0.3MG /0.3ML Solution, Injection as needed) Active. Ibuprofen (600MG  Tablet, Oral as needed)  Active. Magnesium (30MG  Tablet, Oral daily) Active. Melatonin (5MG  Capsule, Oral daily) Active. Singulair (10MG  Tablet, Oral daily) Active. Florastor (250MG  Capsule, Oral daily) Active. Medications Reconciled  Social History Leisure centre manager, CMA; No alcohol use  No drug use  Tobacco use  Current every day smoker, Former smoker.  Family History ([place], [place]; Anesthetic complications  Mother. Arthritis  Father, Mother. Breast Cancer  Family Members In General. Colon Polyps  Father. Heart Disease  Mother. Seizure disorder  Sister.  Pregnancy / Birth History Leisure centre manager, [place]; Age at menarche  48 years. Contraceptive History  Oral contraceptives. Gravida  3 Maternal age  2-20 Para  2  Other Problems ([place], [place];  Arthritis  Gastroesophageal Reflux Disease  Kidney Stone  Migraine Headache     Review of Systems (Chanel [place] CMA;  General Present- Night Sweats. Not Present- Appetite Loss, Chills, Fatigue, Fever, Weight Gain and Weight Loss. Skin Present- Dryness. Not Present- Change in Wart/Mole, Hives, Jaundice, New Lesions, Non-Healing Wounds, Rash and Ulcer. HEENT Present- Seasonal Allergies and Wears glasses/contact lenses. Not Present- Earache, Hearing Loss, Hoarseness, Nose Bleed, Oral Ulcers, Ringing in the Ears, Sinus Pain, Sore Throat, Visual Disturbances and Yellow Eyes. Respiratory Not Present- Bloody sputum, Chronic Cough, Difficulty Breathing, Snoring and Wheezing. Breast Not Present- Breast Mass, Breast Pain, Nipple Discharge and Skin Changes. Cardiovascular Not Present- Chest Pain, Difficulty Breathing Lying Down, Leg Cramps, Palpitations, Rapid Heart Rate, Shortness of Breath and Swelling of Extremities. Gastrointestinal Not Present- Abdominal Pain, Bloating, Bloody Stool, Change in Bowel Habits, Chronic diarrhea, Constipation, Difficulty Swallowing, Excessive gas, Gets full quickly at meals, Hemorrhoids, Indigestion, Nausea,  Rectal Pain and Vomiting. Female Genitourinary Not Present- Frequency, Nocturia, Painful Urination, Pelvic Pain and Urgency. Musculoskeletal Not Present- Back  Pain, Joint Pain, Joint Stiffness, Muscle Pain, Muscle Weakness and Swelling of Extremities. Neurological Not Present- Decreased Memory, Fainting, Headaches, Numbness, Seizures, Tingling, Tremor, Trouble walking and Weakness. Psychiatric Not Present- Anxiety, Bipolar, Change in Sleep Pattern, Depression, Fearful and Frequent crying. Endocrine Present- Hot flashes. Not Present- Cold Intolerance, Excessive Hunger, Hair Changes, Heat Intolerance and New Diabetes. Hematology Not Present- Blood Thinners, Easy Bruising, Excessive bleeding, Gland problems, HIV and Persistent Infections.  Vitals  Weight: 186.5 lb Height: 64.25in Body Surface Area: 1.9 m Body Mass Index: 31.76 kg/m  Temp.: 97.29F  Pulse: 73 (Regular)  BP: 122/72(Sitting, Left Arm, Standard)     Physical Exam The physical exam findings are as follows: Note: She appears well today.  Procedure normal appearance except for ecchymosis in the right breast from the biopsy. I can palpate a small hematoma at the 1:00 position at the biopsy site. The nipple areolar complex is normal. There is no axillary adenopathy.    Assessment & Plan   COMPLEX SCLEROSING LESION OF RIGHT BREAST (N64.89)  Impression: I have reviewed her notes in the electronic medical records. I have reviewed her mammogram and ultrasound as well as biopsy results. I gave her a copy of the biopsy results. She has a complex sclerosing lesion right breast. A radioactive seed guided lumpectomy is recommended for complete histologic evaluation of this area and to rule out malignancy. I discussed the surgical procedure with her in detail. I discussed the risk of procedure which includes but is not limited to bleeding, infection, injury to surrounding structures, need for further procedures if malignancy  is found, or pulmonary issues, postoperative recovery, etc. She understands and wishes to proceed with surgery which will be scheduled.

## 2020-08-01 ENCOUNTER — Encounter: Payer: Medicare Other | Admitting: Adult Health

## 2020-08-01 ENCOUNTER — Ambulatory Visit (HOSPITAL_COMMUNITY): Payer: Medicare Other | Admitting: Vascular Surgery

## 2020-08-01 ENCOUNTER — Ambulatory Visit (HOSPITAL_COMMUNITY): Payer: Medicare Other | Admitting: Anesthesiology

## 2020-08-01 ENCOUNTER — Ambulatory Visit (HOSPITAL_COMMUNITY)
Admission: RE | Admit: 2020-08-01 | Discharge: 2020-08-01 | Disposition: A | Discharge status: Home / Self Care | Payer: Medicare Other | Attending: Surgery | Admitting: Surgery

## 2020-08-01 ENCOUNTER — Other Ambulatory Visit: Payer: Self-pay

## 2020-08-01 ENCOUNTER — Ambulatory Visit
Admission: RE | Admit: 2020-08-01 | Discharge: 2020-08-01 | Disposition: A | Discharge status: Home / Self Care | Payer: Medicare Other | Source: Ambulatory Visit | Attending: Surgery | Admitting: Surgery

## 2020-08-01 ENCOUNTER — Encounter (HOSPITAL_COMMUNITY)
Admission: RE | Disposition: A | Discharge status: Home / Self Care | Payer: Self-pay | Source: Home / Self Care | Attending: Surgery

## 2020-08-01 ENCOUNTER — Encounter (HOSPITAL_COMMUNITY): Payer: Self-pay | Admitting: Surgery

## 2020-08-01 DIAGNOSIS — Z881 Allergy status to other antibiotic agents status: Secondary | ICD-10-CM | POA: Diagnosis not present

## 2020-08-01 DIAGNOSIS — Z7982 Long term (current) use of aspirin: Secondary | ICD-10-CM | POA: Diagnosis not present

## 2020-08-01 DIAGNOSIS — N6489 Other specified disorders of breast: Secondary | ICD-10-CM | POA: Diagnosis not present

## 2020-08-01 DIAGNOSIS — D241 Benign neoplasm of right breast: Secondary | ICD-10-CM | POA: Diagnosis not present

## 2020-08-01 DIAGNOSIS — Z803 Family history of malignant neoplasm of breast: Secondary | ICD-10-CM | POA: Insufficient documentation

## 2020-08-01 DIAGNOSIS — Z79899 Other long term (current) drug therapy: Secondary | ICD-10-CM | POA: Diagnosis not present

## 2020-08-01 DIAGNOSIS — Z888 Allergy status to other drugs, medicaments and biological substances status: Secondary | ICD-10-CM | POA: Diagnosis not present

## 2020-08-01 DIAGNOSIS — N6091 Unspecified benign mammary dysplasia of right breast: Secondary | ICD-10-CM | POA: Diagnosis not present

## 2020-08-01 DIAGNOSIS — R928 Other abnormal and inconclusive findings on diagnostic imaging of breast: Secondary | ICD-10-CM | POA: Diagnosis not present

## 2020-08-01 HISTORY — PX: BREAST LUMPECTOMY WITH RADIOACTIVE SEED LOCALIZATION: SHX6424

## 2020-08-01 LAB — GLUCOSE, CAPILLARY: Glucose-Capillary: 113 mg/dL — ABNORMAL HIGH (ref 70–99)

## 2020-08-01 SURGERY — BREAST LUMPECTOMY WITH RADIOACTIVE SEED LOCALIZATION
Anesthesia: General | Site: Breast | Laterality: Right

## 2020-08-01 MED ORDER — FENTANYL CITRATE (PF) 100 MCG/2ML IJ SOLN: 25.0000 ug | INTRAMUSCULAR | Status: DC | PRN

## 2020-08-01 MED ORDER — PROPOFOL 10 MG/ML IV BOLUS
INTRAVENOUS | Status: AC
  Filled 2020-08-01: qty 20

## 2020-08-01 MED ORDER — BUPIVACAINE HCL (PF) 0.25 % IJ SOLN
INTRAMUSCULAR | Status: AC
  Filled 2020-08-01: qty 30

## 2020-08-01 MED ORDER — ROCURONIUM BROMIDE 10 MG/ML (PF) SYRINGE
PREFILLED_SYRINGE | INTRAVENOUS | Status: AC
  Filled 2020-08-01: qty 10

## 2020-08-01 MED ORDER — MIDAZOLAM HCL 2 MG/2ML IJ SOLN
INTRAMUSCULAR | Status: DC | PRN
  Administered 2020-08-01: 2 mg via INTRAVENOUS

## 2020-08-01 MED ORDER — PHENYLEPHRINE 40 MCG/ML (10ML) SYRINGE FOR IV PUSH (FOR BLOOD PRESSURE SUPPORT)
PREFILLED_SYRINGE | INTRAVENOUS | Status: AC
  Filled 2020-08-01: qty 10

## 2020-08-01 MED ORDER — EPHEDRINE 5 MG/ML INJ
INTRAVENOUS | Status: AC
  Filled 2020-08-01: qty 10

## 2020-08-01 MED ORDER — DEXAMETHASONE SODIUM PHOSPHATE 10 MG/ML IJ SOLN
INTRAMUSCULAR | Status: AC
  Filled 2020-08-01: qty 4

## 2020-08-01 MED ORDER — FENTANYL CITRATE (PF) 250 MCG/5ML IJ SOLN
INTRAMUSCULAR | Status: AC
  Filled 2020-08-01: qty 5

## 2020-08-01 MED ORDER — CHLORHEXIDINE GLUCONATE 0.12 % MT SOLN
15.0000 mL | Freq: Once | OROMUCOSAL | Status: AC
  Administered 2020-08-01: 15 mL via OROMUCOSAL
  Filled 2020-08-01: qty 15

## 2020-08-01 MED ORDER — MIDAZOLAM HCL 2 MG/2ML IJ SOLN
INTRAMUSCULAR | Status: AC
  Filled 2020-08-01: qty 2

## 2020-08-01 MED ORDER — PROPOFOL 10 MG/ML IV BOLUS
INTRAVENOUS | Status: DC | PRN
  Administered 2020-08-01: 150 mg via INTRAVENOUS

## 2020-08-01 MED ORDER — LACTATED RINGERS IV SOLN: INTRAVENOUS | Status: DC

## 2020-08-01 MED ORDER — LIDOCAINE 2% (20 MG/ML) 5 ML SYRINGE
INTRAMUSCULAR | Status: DC | PRN
  Administered 2020-08-01: 60 mg via INTRAVENOUS

## 2020-08-01 MED ORDER — CHLORHEXIDINE GLUCONATE CLOTH 2 % EX PADS: 6.0000 | MEDICATED_PAD | Freq: Once | CUTANEOUS | Status: DC

## 2020-08-01 MED ORDER — TRAMADOL HCL 50 MG PO TABS: 50.0000 mg | ORAL_TABLET | Freq: Four times a day (QID) | ORAL | 0 refills | Status: DC | PRN

## 2020-08-01 MED ORDER — AMISULPRIDE (ANTIEMETIC) 5 MG/2ML IV SOLN: 10.0000 mg | Freq: Once | INTRAVENOUS | Status: DC | PRN

## 2020-08-01 MED ORDER — ENSURE PRE-SURGERY PO LIQD: 296.0000 mL | Freq: Once | ORAL | Status: DC

## 2020-08-01 MED ORDER — ACETAMINOPHEN 500 MG PO TABS
1000.0000 mg | ORAL_TABLET | ORAL | Status: AC
  Administered 2020-08-01: 1000 mg via ORAL
  Filled 2020-08-01: qty 2

## 2020-08-01 MED ORDER — GABAPENTIN 300 MG PO CAPS
300.0000 mg | ORAL_CAPSULE | ORAL | Status: AC
  Administered 2020-08-01: 300 mg via ORAL
  Filled 2020-08-01: qty 1

## 2020-08-01 MED ORDER — BUPIVACAINE HCL (PF) 0.25 % IJ SOLN
INTRAMUSCULAR | Status: DC | PRN
  Administered 2020-08-01: 13 mL

## 2020-08-01 MED ORDER — FENTANYL CITRATE (PF) 250 MCG/5ML IJ SOLN
INTRAMUSCULAR | Status: DC | PRN
  Administered 2020-08-01: 50 ug via INTRAVENOUS

## 2020-08-01 MED ORDER — DEXAMETHASONE SODIUM PHOSPHATE 10 MG/ML IJ SOLN
INTRAMUSCULAR | Status: DC | PRN
  Administered 2020-08-01: 10 mg via INTRAVENOUS

## 2020-08-01 MED ORDER — CEFAZOLIN SODIUM-DEXTROSE 2-4 GM/100ML-% IV SOLN
2.0000 g | INTRAVENOUS | Status: AC
  Administered 2020-08-01: 2 g via INTRAVENOUS
  Filled 2020-08-01: qty 100

## 2020-08-01 MED ORDER — 0.9 % SODIUM CHLORIDE (POUR BTL) OPTIME
TOPICAL | Status: DC | PRN
  Administered 2020-08-01: 1000 mL

## 2020-08-01 MED ORDER — ONDANSETRON HCL 4 MG/2ML IJ SOLN
INTRAMUSCULAR | Status: DC | PRN
  Administered 2020-08-01: 4 mg via INTRAVENOUS

## 2020-08-01 MED ORDER — ONDANSETRON HCL 4 MG/2ML IJ SOLN
INTRAMUSCULAR | Status: AC
  Filled 2020-08-01: qty 8

## 2020-08-01 MED ORDER — LIDOCAINE 2% (20 MG/ML) 5 ML SYRINGE
INTRAMUSCULAR | Status: AC
  Filled 2020-08-01: qty 20

## 2020-08-01 MED ORDER — ORAL CARE MOUTH RINSE: 15.0000 mL | Freq: Once | OROMUCOSAL | Status: AC

## 2020-08-01 MED ORDER — EPHEDRINE SULFATE 50 MG/ML IJ SOLN
INTRAMUSCULAR | Status: DC | PRN
  Administered 2020-08-01: 5 mg via INTRAVENOUS

## 2020-08-01 SURGICAL SUPPLY — 31 items
APPLIER CLIP 9.375 MED OPEN (MISCELLANEOUS) ×2
BINDER BREAST LRG (GAUZE/BANDAGES/DRESSINGS) IMPLANT
BINDER BREAST XLRG (GAUZE/BANDAGES/DRESSINGS) IMPLANT
CANISTER SUCT 3000ML PPV (MISCELLANEOUS) ×2 IMPLANT
CHLORAPREP W/TINT 26 (MISCELLANEOUS) ×2 IMPLANT
CLIP APPLIE 9.375 MED OPEN (MISCELLANEOUS) ×1 IMPLANT
COVER PROBE W GEL 5X96 (DRAPES) ×2 IMPLANT
COVER SURGICAL LIGHT HANDLE (MISCELLANEOUS) ×2 IMPLANT
COVER WAND RF STERILE (DRAPES) ×2 IMPLANT
DERMABOND ADVANCED (GAUZE/BANDAGES/DRESSINGS) ×1
DERMABOND ADVANCED .7 DNX12 (GAUZE/BANDAGES/DRESSINGS) ×1 IMPLANT
DEVICE DUBIN SPECIMEN MAMMOGRA (MISCELLANEOUS) ×2 IMPLANT
DRAPE CHEST BREAST 15X10 FENES (DRAPES) ×2 IMPLANT
ELECT CAUTERY BLADE 6.4 (BLADE) ×2 IMPLANT
ELECT REM PT RETURN 9FT ADLT (ELECTROSURGICAL) ×2
ELECTRODE REM PT RTRN 9FT ADLT (ELECTROSURGICAL) ×1 IMPLANT
GLOVE SURG SIGNA 7.5 PF LTX (GLOVE) ×2 IMPLANT
GOWN STRL REUS W/ TWL LRG LVL3 (GOWN DISPOSABLE) ×1 IMPLANT
GOWN STRL REUS W/ TWL XL LVL3 (GOWN DISPOSABLE) ×1 IMPLANT
GOWN STRL REUS W/TWL LRG LVL3 (GOWN DISPOSABLE) ×2
GOWN STRL REUS W/TWL XL LVL3 (GOWN DISPOSABLE) ×2
KIT BASIN OR (CUSTOM PROCEDURE TRAY) ×2 IMPLANT
KIT MARKER MARGIN INK (KITS) ×2 IMPLANT
NEEDLE HYPO 25GX1X1/2 BEV (NEEDLE) ×2 IMPLANT
NS IRRIG 1000ML POUR BTL (IV SOLUTION) IMPLANT
PACK GENERAL/GYN (CUSTOM PROCEDURE TRAY) ×2 IMPLANT
SUT MNCRL AB 4-0 PS2 18 (SUTURE) ×2 IMPLANT
SUT VIC AB 3-0 SH 18 (SUTURE) ×2 IMPLANT
SYR CONTROL 10ML LL (SYRINGE) ×2 IMPLANT
TOWEL GREEN STERILE (TOWEL DISPOSABLE) ×2 IMPLANT
TOWEL GREEN STERILE FF (TOWEL DISPOSABLE) ×2 IMPLANT

## 2020-08-01 NOTE — Discharge Instructions (Signed)
[facility] Office Phone Number [phone]  BREAST BIOPSY/ PARTIAL MASTECTOMY: POST OP INSTRUCTIONS  Always review your discharge instruction sheet given to you by the facility where your surgery was performed.  IF YOU HAVE DISABILITY OR FAMILY LEAVE FORMS, YOU MUST BRING THEM TO THE OFFICE FOR PROCESSING.  DO NOT GIVE THEM TO YOUR DOCTOR.  1. A prescription for pain medication may be given to you upon discharge.  Take your pain medication as prescribed, if needed.  If narcotic pain medicine is not needed, then you may take acetaminophen (Tylenol) or ibuprofen (Advil) as needed. 2. Take your usually prescribed medications unless otherwise directed 3. If you need a refill on your pain medication, please contact your pharmacy.  They will contact our office to request authorization.  Prescriptions will not be filled after 5pm or on week-ends. 4. You should eat very light the first 24 hours after surgery, such as soup, crackers, pudding, etc.  Resume your normal diet the day after surgery. 5. Most patients will experience some swelling and bruising in the breast.  Ice packs and a good support bra will help.  Swelling and bruising can take several days to resolve.  6. It is common to experience some constipation if taking pain medication after surgery.  Increasing fluid intake and taking a stool softener will usually help or prevent this problem from occurring.  A mild laxative (Milk of Magnesia or Miralax) should be taken according to package directions if there are no bowel movements after 48 hours. 7. Unless discharge instructions indicate otherwise, you may remove your bandages 24-48 hours after surgery, and you may shower at that time.  You may have steri-strips (small skin tapes) in place directly over the incision.  These strips should be left on the skin for 7-10 days.  If your surgeon used skin glue on the incision, you may shower in 24 hours.  The glue will flake off over the  next 2-3 weeks.  Any sutures or staples will be removed at the office during your follow-up visit. 8. ACTIVITIES:  You may resume regular daily activities (gradually increasing) beginning the next day.  Wearing a good support bra or sports bra minimizes pain and swelling.  You may have sexual intercourse when it is comfortable. a. You may drive when you no longer are taking prescription pain medication, you can comfortably wear a seatbelt, and you can safely maneuver your car and apply brakes. b. RETURN TO WORK:  ______________________________________________________________________________________ 9. You should see your doctor in the office for a follow-up appointment approximately two weeks after your surgery.  Your doctor's nurse will typically make your follow-up appointment when she calls you with your pathology report.  Expect your pathology report 2-3 business days after your surgery.  You may call to check if you do not hear from us after three days. 10. OTHER INSTRUCTIONS:OK TO SHOWER STARTING TOMORROW 11. ICE PACK, TYLENOL, AND IBUPROFEN ALSO FOR PAIN 12. NO VIGOROUS ACTIVITY FOR ONE WEEK _______________________________________________________________________________________________ _____________________________________________________________________________________________________________________________________ _____________________________________________________________________________________________________________________________________ _____________________________________________________________________________________________________________________________________  WHEN TO CALL YOUR DOCTOR: 1. Fever over 101.0 2. Nausea and/or vomiting. 3. Extreme swelling or bruising. 4. Continued bleeding from incision. 5. Increased pain, redness, or drainage from the incision.  The clinic staff is available to answer your questions during regular business hours.  Please don't hesitate to  call and ask to speak to one of the nurses for clinical concerns.  If you have a medical emergency, go to the nearest emergency room or call 911.  A   surgeon from [facility] is always on call at the hospital.  For further questions, please visit [email or web address]  

## 2020-08-01 NOTE — Op Note (Signed)
RIGHT BREAST LUMPECTOMY WITH RADIOACTIVE SEED LOCALIZATION  Procedure Note  Karen Barrera [facility] 08/01/2020   Pre-op Diagnosis: COMPLEX SCLEROSING LESION RIGHT BREAST     Post-op Diagnosis: same  Procedure(s): RIGHT BREAST LUMPECTOMY WITH RADIOACTIVE SEED LOCALIZATION  Surgeon(s): [name], MD Carlena Hurl, PA-C  Anesthesia: General  Staff:  Circulator: Hal Morales, RN Scrub Person: Teschner, Burman Foster, CST Circulator Assistant: Jackelyn Hoehn, RN  Estimated Blood Loss: Minimal               Specimens: sent to path  Indications: This is a 70 year old female who was found to have a small abnormality in the right breast on screening mammography.  She underwent a biopsy of this showing a complex sclerosing lesion.  The decision was made to proceed with a radioactive seed guided lumpectomy to remove this area for complete histologic evaluation.  Findings: The radioactive seed and tissue marker were seen on x-ray to be in the lumpectomy specimen  Procedure: The patient was brought to the operating room and identified as the correct patient.  She is placed upon the operating table general anesthesia was induced.  Her right breast was prepped and draped in the usual sterile fashion.  Using the neoprobe, the radioactive seed was located at the 1 o'clock position of the right breast at the edge of the areola.  I anesthetized the skin at the areola with Marcaine and then made incision with a scalpel.  I then dissected down to the breast tissue with electrocautery.  The seed was easily identified I performed a lumpectomy staying around the seed with the aid of the neoprobe using the electrocautery.  Once I completely removed the specimen, we marked the margins with paint, and then x-rayed the specimen confirming the radioactive seed and previous tissue marker in the center of the specimen.  This was then sent to pathology for evaluation.  I next achieved hemostasis with the cautery and  the lumpectomy cavity.  I anesthetized the incision further with Marcaine.  We then closed the subcutaneous tissue with interrupted 3-0 Vicryl sutures and closed the skin with a running 4-0 Monocryl.  Dermabond was then applied.  The patient tolerated the procedure well.  All the counts were correct at the end of the procedure.  The patient was then extubated in the operating room and taken in stable addition to the recovery room.          [name]   Date: 08/01/2020  Time: 10:46 AM

## 2020-08-01 NOTE — Transfer of Care (Signed)
Immediate Anesthesia Transfer of Care Note  Patient: Mairead Schwarzkopf Rufener  Procedure(s) Performed: RIGHT BREAST LUMPECTOMY WITH RADIOACTIVE SEED LOCALIZATION (Right Breast)  Patient Location: PACU  Anesthesia Type:General  Level of Consciousness: awake, alert  and oriented  Airway & Oxygen Therapy: Patient Spontanous Breathing  Post-op Assessment: Report given to RN and Post -op Vital signs reviewed and stable  Post vital signs: Reviewed and stable  Last Vitals:  Vitals Value Taken Time  BP    Temp    Pulse 61 08/01/20 1057  Resp 15 08/01/20 1057  SpO2 97 % 08/01/20 1057  Vitals shown include unvalidated device data.  Last Pain:  Vitals:   08/01/20 0821  TempSrc:   PainSc: 0-No pain         Complications: No complications documented.

## 2020-08-01 NOTE — Interval H&P Note (Signed)
History and Physical Interval Note: no change in H and P  08/01/2020 9:57 AM  Karen Barrera  has presented today for surgery, with the diagnosis of [place].  The various methods of treatment have been discussed with the patient and family. After consideration of risks, benefits and other options for treatment, the patient has consented to  Procedure(s) with comments: RIGHT BREAST LUMPECTOMY WITH RADIOACTIVE SEED LOCALIZATION (Right) - LMA as a surgical intervention.  The patient's history has been reviewed, patient examined, no change in status, stable for surgery.  I have reviewed the patient's chart and labs.  Questions were answered to the patient's satisfaction.     [name]

## 2020-08-01 NOTE — Anesthesia Procedure Notes (Signed)
Procedure Name: LMA Insertion Date/Time: 08/01/2020 10:17 AM Performed by: Clearnce Sorrel, CRNA Pre-anesthesia Checklist: Patient identified, Emergency Drugs available, Suction available, Patient being monitored and Timeout performed Patient Re-evaluated:Patient Re-evaluated prior to induction Oxygen Delivery Method: Circle system utilized Preoxygenation: Pre-oxygenation with 100% oxygen Induction Type: IV induction LMA: LMA inserted LMA Size: 3.0 Number of attempts: 1 Placement Confirmation: positive ETCO2 and breath sounds checked- equal and bilateral Tube secured with: Tape Dental Injury: Teeth and Oropharynx as per pre-operative assessment

## 2020-08-01 NOTE — Anesthesia Postprocedure Evaluation (Signed)
Anesthesia Post Note  Patient: Karen Barrera  Procedure(s) Performed: RIGHT BREAST LUMPECTOMY WITH RADIOACTIVE SEED LOCALIZATION (Right Breast)     Patient location during evaluation: PACU Anesthesia Type: General Level of consciousness: awake and alert Pain management: pain level controlled Vital Signs Assessment: post-procedure vital signs reviewed and stable Respiratory status: spontaneous breathing, nonlabored ventilation, respiratory function stable and patient connected to nasal cannula oxygen Cardiovascular status: blood pressure returned to baseline and stable Postop Assessment: no apparent nausea or vomiting Anesthetic complications: no   No complications documented.  Last Vitals:  Vitals:   08/01/20 1110 08/01/20 1125  BP: 122/67 138/70  Pulse: (!) 58 (!) 57  Resp: 14   Temp:  (!) 36.1 C  SpO2: 95% 94%    Last Pain:  Vitals:   08/01/20 1125  TempSrc:   PainSc: Asleep                 Tiajuana Amass

## 2020-08-02 ENCOUNTER — Encounter (HOSPITAL_COMMUNITY): Payer: Self-pay | Admitting: Surgery

## 2020-08-06 ENCOUNTER — Telehealth: Payer: Self-pay

## 2020-08-06 LAB — SURGICAL PATHOLOGY

## 2020-08-06 NOTE — Telephone Encounter (Signed)
Patient states that she had blood work and EKG done on 12/28. Wanting to know if we can just use that instead of getting more blood work tomorrow for her visit with [place].

## 2020-08-06 NOTE — Progress Notes (Signed)
CPE AND 3 MONTH   Assessment:    CPE 1 year  Aortic atherosclerosis (HCC) Control blood pressure, cholesterol, glucose, increase exercise.   COPD mixed type ([place]) No triggers, well controlled symptoms, cont to monitor  Malignant neoplasm of upper lobe of left lung ([place]) S/p lobectomy, continue follow up  Recurrent major depressive disorder, in full remission ([place]) -     TSH  Mixed hyperlipidemia -     Lipid panel check lipids decrease fatty foods increase activity.   Anemia, unspecified type -     CBC with Differential/Platelet -     Iron,Total/Total Iron Binding Cap -     Ferritin - monitor, continue iron supp with Vitamin C and increase green leafy veggies  Vitamin D deficiency Continue supplement  Medication management -     CBC with Differential/Platelet -     COMPLETE METABOLIC PANEL WITH GFR -     Magnesium  Obesity  - follow up 3 months for progress monitoring - increase veggies, decrease carbs - long discussion about weight loss, diet, and exercise  Fatty liver -     COMPLETE METABOLIC PANEL WITH GFR Weight loss advised, will monitor LFTs  Agatston coronary artery calcium score greater than 400 Control blood pressure, cholesterol, glucose, increase exercise.  Following with Dr. Claiborne Billings  Hot flashes Continue meds, off of estrogen, GYN managing  Hx of adenomatous colonic polyps UTD  Abnormal glucose -     Hemoglobin A1c Discussed disease progression and risks Discussed diet/exercise, weight management and risk modification  B12 deficiency -     Vitamin B12  Former smoker (50 pack year, quit 2020) Surgeon following with CTs  Orders Placed This Encounter  Procedures  . CBC with Differential/Platelet  . COMPLETE METABOLIC PANEL WITH GFR  . Magnesium  . Lipid panel  . TSH  . Microalbumin / creatinine urine ratio  . Urinalysis, Routine w reflex microscopic  . Vitamin B12      Over 40 minutes of exam, counseling, chart review and  critical decision making was performed Future Appointments  Date Time Provider [place]  10/17/2020  9:00 AM Deneise Lever, MD [facility] None  08/07/2021  9:00 AM Liane Comber, NP [facility] None    Subjective:  Karen Barrera is a 70 y.o. female who presents for CPE. She has Seasonal and perennial allergic rhinitis; Depression; Hyperlipidemia; Anemia; Pre-diabetes; Vitamin D deficiency; Tobacco dependence; Medication management; Hx of adenomatous colonic polyps; Costochondritis; COPD mixed type ([place]); Hot flashes; Obesity (BMI 30.0-34.9); Encounter for Medicare annual wellness exam; Genetic testing; Aortic atherosclerosis ([place]); Fatty liver; Agatston coronary artery calcium score greater than 400; Lung cancer ([place]); Hormone replacement therapy; Arthritis; Gastroesophageal reflux disease; Osteopenia; Primary osteoarthritis of first carpometacarpal joint of left hand; Family history of cancer; and VIN III (vulvar intraepithelial neoplasia III) on their problem list.  She is married, 73 years, 2 children, 4 grandkids, 1 greatgrand, all local.  She is retired Chief Executive Officer for [employer]. Enjoys retirement.  Mom and dad still living. She takes care of her grandsons.   Remote TAH. She is recently following with GYN Dr[facility]ston [facility] and Dr. Denman George for VIN III, planning q6m follow up. Still has hot flashes, but off of estrogen due to cancer, has upcoming appointment with GYN to discuss.   Recently on 08/01/2020 had R lumpectomy by Dr. Ninfa Linden for 0.6 x 0.7 x 0.3 lesion was seen at the 1 o'clock position of the right breast near the nipple areolar  complex seen on recent mammogram. A biopsy of this was performed showing a complex sclerosing lesion, biopsy returned benign. Can resume routine annual mammograms.   She has COPD, s/p left lobectomy for stage 2 non small cell carcinoma on 09/28/2018 with Dr. Servando Snare. She has not smoked since March 3rd 2020.  Occasional dry cough but has improved, she uses albuterol to take as needed, didn't see benefit with anoro. Has CT follow up planned this month with surgeon.   She is on prilosec 40 mg, recently takes PRN, hasn't needed in some time.  BMI is Body mass index is 31.65 kg/m., she has been working on diet and exercise, trying to lose weight, down from 199 lb. Admits to drinking too much diet Dr. Malachi Bonds.  Wt Readings from Last 3 Encounters:  08/07/20 184 lb 6.4 oz (83.6 kg)  08/01/20 183 lb (83 kg)  07/24/20 183 lb 3.2 oz (83.1 kg)   Her blood pressure has been controlled at home, today their BP is BP: 110/66 She does not workout, she is cleaning, being active inside but not doing any working out. She follows with Dr. Claiborne Billings.She denies chest pain, shortness of breath, dizziness.   She is on cholesterol medication and denies myalgias. Her cholesterol is at goal. The cholesterol last visit was:   Lab Results  Component Value Date   CHOL 140 03/23/2020   HDL 61 03/23/2020   LDLCALC 62 03/23/2020   TRIG 87 03/23/2020   CHOLHDL 2.3 03/23/2020   Last A1C:  Lab Results  Component Value Date   HGBA1C 6.0 (H) 07/24/2020   Last GFR: Lab Results  Component Value Date   GFRNONAA >60 07/24/2020   Patient is on Vitamin D supplement, taking 10000 IU  Lab Results  Component Value Date   VD25OH 66 03/23/2020      Lab Results  Component Value Date   VITAMINB12 503 08/02/2019     Medication Review:   Current Outpatient Medications (Cardiovascular):  .  metoprolol succinate (TOPROL-XL) 25 MG 24 hr tablet, TAKE 1 TABLET BY MOUTH EVERY DAY (Patient taking differently: Take 25 mg by mouth daily.) .  rosuvastatin (CRESTOR) 40 MG tablet, TAKE ONE TABLET BY MOUTH DAILY (Patient taking differently: Take 40 mg by mouth daily.)  Current Outpatient Medications (Respiratory):  .  albuterol (PROVENTIL) (2.5 MG/3ML) 0.083% nebulizer solution, Take 3 mLs (2.5 mg total) by nebulization every 6 (six)  hours as needed for wheezing or shortness of breath. Marland Kitchen  albuterol (VENTOLIN HFA) 108 (90 Base) MCG/ACT inhaler, USE 2 INHALATIONS 25 MINUTES APART EVERY 4 HOURS IF NEEDED TO RESCUE ASTHMA ATTACK (Patient taking differently: Inhale 2 puffs into the lungs every 4 (four) hours as needed for wheezing or shortness of breath (Asthma Attack). [place]) .  cetirizine (ZYRTEC) 10 MG tablet, Take 10 mg by mouth daily. .  diphenhydrAMINE (BENADRYL) 25 MG tablet, Take 25 mg by mouth daily. .  montelukast (SINGULAIR) 10 MG tablet, TAKE 1 TABLET DAILY FOR    ALLERGIES (Patient taking differently: Take 10 mg by mouth daily.)  Current Outpatient Medications (Analgesics):  .  aspirin EC 81 MG tablet, Take 81 mg by mouth daily. Marland Kitchen  ibuprofen (ADVIL,MOTRIN) 200 MG tablet, Take 600 mg by mouth every 8 (eight) hours as needed for headache or moderate pain.  .  traMADol (ULTRAM) 50 MG tablet, Take 1 tablet (50 mg total) by mouth every 6 (six) hours as needed for moderate pain.   Current Outpatient Medications (Other):  .  Calcium Carbonate Antacid (CALCIUM CARBONATE PO)*, Take 1,200 mg by mouth daily with breakfast. .  Cholecalciferol (VITAMIN D-3) 125 MCG (5000 UT) TABS, Take 10,000 Units by mouth daily. Please discuss dosage with medical doctor as is a high dose (Patient taking differently: Take 5,000 Units by mouth daily. Please discuss dosage with medical doctor as is a high dose) .  omeprazole (PRILOSEC) 40 MG capsule, Take 1 capsule 2 x  /day with Bkfst & Supper for Heartburn & Reflux (Patient taking differently: Take 40 mg by mouth daily as needed (Bkfst & Supper for Heartburn & Reflux).) .  sertraline (ZOLOFT) 100 MG tablet, TAKE 1 TABLET BY MOUTH DAILY FOR MOOD (Patient taking differently: Take 100 mg by mouth daily.) .  zinc gluconate 50 MG tablet, Take 50 mg by mouth daily. * These medications belong to multiple therapeutic classes and are listed under each applicable group.   Allergies  Allergen  Reactions  . Chantix [Varenicline] Anaphylaxis and Swelling    Makes throat swell up  . Moxifloxacin Hcl In Nacl Other (See Comments)    aches    Current Problems (verified) Patient Active Problem List   Diagnosis Date Noted  . VIN III (vulvar intraepithelial neoplasia III) 05/11/2020  . Primary osteoarthritis of first carpometacarpal joint of left hand 04/27/2019  . Hormone replacement therapy 04/06/2019  . Arthritis 04/06/2019  . Gastroesophageal reflux disease 04/06/2019  . Osteopenia 04/06/2019  . Family history of cancer 04/06/2019  . Lung cancer ([place]) 09/28/2018  . Agatston coronary artery calcium score greater than 400   . Fatty liver 08/29/2018  . Aortic atherosclerosis ([place]) 08/06/2017  . Genetic testing 04/24/2017  . Hot flashes 11/12/2016  . Obesity (BMI 30.0-34.9) 11/12/2016  . Encounter for Medicare annual wellness exam 11/12/2016  . COPD mixed type ([place]) 06/05/2016  . Costochondritis 03/23/2015  . Hx of adenomatous colonic polyps 01/23/2015  . Medication management 11/21/2014  . Depression   . Hyperlipidemia   . Anemia   . Pre-diabetes   . Vitamin D deficiency   . Tobacco dependence   . Seasonal and perennial allergic rhinitis 07/03/2013    Screening Tests Immunization History  Administered Date(s) Administered  . Influenza Split 05/16/2013, 05/12/2014, 05/14/2015  . Influenza, High Dose Seasonal PF 05/15/2017, 05/07/2018, 03/29/2019  . Influenza, Seasonal, Injecte, Preservative Fre 05/13/2016  . Influenza-Unspecified 07/23/2020  . PFIZER SARS-COV-2 Vaccination 10/01/2019, 10/22/2019, 05/17/2020  . Pneumococcal Conjugate-13 11/12/2016  . Pneumococcal Polysaccharide-23 06/14/2011  . Tdap 03/27/2010  . Zoster 04/14/2012   Tetanus: 2011 due, will get PRN Pneumovax:  2012 Prevnar 13: 2018 Flu vaccine: 2021 Zostavax: 2013 COVID: 2/2, 2021 Shingrix 2021 has gotten both at pharmacy -  need documentation  MGM: 03/2020 diagnostic, requested prior  screening mammogram from GYN DEXA: 2019  At GYN Osteopenia will get at GYN PAP getting by GYN, vulvar leasion Colonoscopy: 05/2020 due in 3 years, 5 polyps,Dr. Carlean Purl  PFT 08/2018 CT chest 03/2019 Cath 08/2018  Names of Other Physician/Practitioners you currently use: 1. [facility] here for primary care 2. Dr. Dorena Cookey, eye doctor, last visit 2021 3. Dr. Luretha Rued, dentist 2021 4. Dr. Marland Kitchen derm, last visit 04/2020 for total body check   Patient Care Team: [name], MD as PCP - General (Internal Medicine) Troy Sine, MD as PCP - Cardiology (Cardiology) Izora Gala, MD as Consulting Physician (Otolaryngology) Delila Pereyra, MD as Consulting Physician (Gynecology) Gatha Mayer, MD as Consulting Physician (Gastroenterology) Maple Hudson, MD as Referring  Physician (Surgery)  SURGICAL HISTORY She  has a past surgical history that includes Shoulder arthroscopy w/ rotator cuff repair (Left, 2012); Tendon repair (Left, 10/2009); Shoulder arthroscopy (Right, 2003); Salpingoophorectomy (Right, 1999); Sinus exploration (12/2013); Vaginal hysterectomy (1978); laparoscopic appendectomy (N/A, 10/31/2014); Appendectomy; Colonoscopy; Cyst excision (Right, 01/11/2016); Arthrotomy (Right, 01/11/2016); LEFT HEART CATH AND CORONARY ANGIOGRAPHY (N/A, 09/06/2018); Video bronchoscopy (N/A, 09/28/2018); Video assisted thoracoscopy (vats)/wedge resection (Left, 09/28/2018); Ganglion cyst excision (Right, 02/21/2020); Polypectomy; and Breast lumpectomy with radioactive seed localization (Right, 08/01/2020). FAMILY HISTORY Her family history includes Arthritis in her paternal grandmother; Cancer in an other family member; Colon cancer in an other family member; Colon cancer (age of onset: 32) in some other family members; Colon polyps in her father; Diabetes in her mother; Epilepsy in her sister; Hyperlipidemia in her maternal grandfather; Melanoma (age of onset: 6) in her  paternal grandmother; Skin cancer (age of onset: 41) in her mother; Stroke in her maternal grandmother. SOCIAL HISTORY She  reports that she quit smoking about 22 months ago. Her smoking use included cigarettes. She has a 50.00 pack-year smoking history. She has never used smokeless tobacco. She reports that she does not drink alcohol and does not use drugs.   Review of Systems  Constitutional: Negative.   HENT: Negative.  Negative for congestion and sinus pain.   Eyes: Negative.   Respiratory: Negative for cough, hemoptysis, sputum production, shortness of breath and wheezing.   Cardiovascular: Negative.  Negative for chest pain and leg swelling.  Gastrointestinal: Negative for abdominal pain, blood in stool, constipation, diarrhea, heartburn, melena, nausea and vomiting.  Genitourinary: Negative.   Musculoskeletal: Negative for back pain, falls, joint pain, myalgias and neck pain.  Skin: Negative.  Negative for rash.  Neurological: Negative.  Negative for dizziness, tingling and sensory change.  Endo/Heme/Allergies: Negative.   Psychiatric/Behavioral: Negative for depression, hallucinations, memory loss, substance abuse and suicidal ideas. The patient is not nervous/anxious and does not have insomnia.      Objective:     Today's Vitals   08/07/20 0901  BP: 110/66  Pulse: 60  Temp: (!) 97.2 F (36.2 C)  SpO2: 99%  Weight: 184 lb 6.4 oz (83.6 kg)  Height: 5\' 4"  (1.626 m)   Body mass index is 31.65 kg/m.  General appearance: alert, no distress, WD/WN, female HEENT: normocephalic, sclerae anicteric, TMs pearly, nares patent, no discharge or erythema, pharynx normal Oral cavity: MMM, no lesions Neck: supple, no lymphadenopathy, no thyromegaly, no masses Heart: RRR, normal S1, S2, 1/6 systolic murmur Lungs: CTA bilaterally, no wheezes, rhonchi, or rales Abdomen: +bs, soft, non-tender, non distended, no masses, no hepatomegaly, no splenomegaly Musculoskeletal: nontender, no  swelling, no obvious deformity Extremities: no edema, no cyanosis, no clubbing Pulses: 2+ symmetric, upper and lower extremities, normal cap refill Neurological: alert, oriented x 3, CN2-12 intact, strength normal upper extremities and lower extremities, sensation normal throughout, DTRs 2+ throughout, no cerebellar signs, gait normal Psychiatric: normal affect, behavior normal, pleasant  Breast: R medial breast with well healing surgical scar without erythema or discharge  EKG: recently had 07/24/2020 prior to surgery; no concerning changes; defer -   Izora Ribas, NP   08/07/2020

## 2020-08-07 ENCOUNTER — Encounter: Payer: Self-pay | Admitting: Adult Health

## 2020-08-07 ENCOUNTER — Other Ambulatory Visit: Payer: Self-pay

## 2020-08-07 ENCOUNTER — Ambulatory Visit (INDEPENDENT_AMBULATORY_CARE_PROVIDER_SITE_OTHER): Payer: Medicare Other | Admitting: Adult Health

## 2020-08-07 VITALS — BP 110/66 | HR 60 | Temp 97.2°F | Ht 64.0 in | Wt 184.4 lb

## 2020-08-07 DIAGNOSIS — R232 Flushing: Secondary | ICD-10-CM

## 2020-08-07 DIAGNOSIS — R030 Elevated blood-pressure reading, without diagnosis of hypertension: Secondary | ICD-10-CM

## 2020-08-07 DIAGNOSIS — D649 Anemia, unspecified: Secondary | ICD-10-CM

## 2020-08-07 DIAGNOSIS — F3342 Major depressive disorder, recurrent, in full remission: Secondary | ICD-10-CM | POA: Diagnosis not present

## 2020-08-07 DIAGNOSIS — D071 Carcinoma in situ of vulva: Secondary | ICD-10-CM | POA: Diagnosis not present

## 2020-08-07 DIAGNOSIS — J302 Other seasonal allergic rhinitis: Secondary | ICD-10-CM

## 2020-08-07 DIAGNOSIS — K219 Gastro-esophageal reflux disease without esophagitis: Secondary | ICD-10-CM

## 2020-08-07 DIAGNOSIS — R931 Abnormal findings on diagnostic imaging of heart and coronary circulation: Secondary | ICD-10-CM

## 2020-08-07 DIAGNOSIS — J3089 Other allergic rhinitis: Secondary | ICD-10-CM | POA: Diagnosis not present

## 2020-08-07 DIAGNOSIS — J449 Chronic obstructive pulmonary disease, unspecified: Secondary | ICD-10-CM | POA: Diagnosis not present

## 2020-08-07 DIAGNOSIS — E538 Deficiency of other specified B group vitamins: Secondary | ICD-10-CM

## 2020-08-07 DIAGNOSIS — Z79899 Other long term (current) drug therapy: Secondary | ICD-10-CM | POA: Diagnosis not present

## 2020-08-07 DIAGNOSIS — C3490 Malignant neoplasm of unspecified part of unspecified bronchus or lung: Secondary | ICD-10-CM | POA: Diagnosis not present

## 2020-08-07 DIAGNOSIS — Z0000 Encounter for general adult medical examination without abnormal findings: Secondary | ICD-10-CM

## 2020-08-07 DIAGNOSIS — I700 Atherosclerosis of aorta: Secondary | ICD-10-CM

## 2020-08-07 DIAGNOSIS — K760 Fatty (change of) liver, not elsewhere classified: Secondary | ICD-10-CM | POA: Diagnosis not present

## 2020-08-07 DIAGNOSIS — F17200 Nicotine dependence, unspecified, uncomplicated: Secondary | ICD-10-CM

## 2020-08-07 DIAGNOSIS — E669 Obesity, unspecified: Secondary | ICD-10-CM

## 2020-08-07 DIAGNOSIS — M8580 Other specified disorders of bone density and structure, unspecified site: Secondary | ICD-10-CM | POA: Diagnosis not present

## 2020-08-07 DIAGNOSIS — E782 Mixed hyperlipidemia: Secondary | ICD-10-CM

## 2020-08-07 DIAGNOSIS — E559 Vitamin D deficiency, unspecified: Secondary | ICD-10-CM

## 2020-08-07 DIAGNOSIS — Z86010 Personal history of colonic polyps: Secondary | ICD-10-CM

## 2020-08-07 NOTE — Patient Instructions (Addendum)
Karen Barrera , Thank you for taking time to come for your Annual Wellness Visit. I appreciate your ongoing commitment to your health goals. Please review the following plan we discussed and let me know if I can assist you in the future.   These are the goals we discussed: Goals    . Weight (lb) < 160 lb (72.6 kg)       This is a list of the screening recommended for you and due dates:  Health Maintenance  Topic Date Due  . Mammogram  03/09/2020  .  Hepatitis C: One time screening is recommended by Center for Disease Control  (CDC) for  adults born from [birth year] through 1965.   02/04/2021*  . Tetanus Vaccine  08/07/2021*  . Pneumonia vaccines (2 of 2 - PPSV23) 08/07/2021*  . COVID-19 Vaccine (4 - Booster for Pfizer series) 11/15/2020  . Colon Cancer Screening  05/31/2023  . Flu Shot  Completed  . DEXA scan (bone density measurement)  Completed  *Topic was postponed. The date shown is not the original due date.    Know what a healthy weight is for you (roughly BMI <25) and aim to maintain this  Aim for 7+ servings of fruits and vegetables daily  65-80+ fluid ounces of water or unsweet tea for healthy kidneys  Limit to max 1 drink of alcohol per day; avoid smoking/tobacco  Limit animal fats in diet for cholesterol and heart health - choose grass fed whenever available  Avoid highly processed foods, and foods high in saturated/trans fats  Aim for low stress - take time to unwind and care for your mental health  Aim for 150 min of moderate intensity exercise weekly for heart health, and weights twice weekly for bone health  Aim for 7-9 hours of sleep daily     Prediabetes Eating Plan Prediabetes is a condition that causes blood sugar (glucose) levels to be higher than normal. This increases the risk for developing type 2 diabetes (type 2 diabetes mellitus). Working with a health care provider or nutrition specialist (dietitian) to make diet and lifestyle changes can help prevent  the onset of diabetes. These changes may help you:  Control your blood glucose levels.  Improve your cholesterol levels.  Manage your blood pressure. What are tips for following this plan? Reading food labels  Read food labels to check the amount of fat, salt (sodium), and sugar in prepackaged foods. Avoid foods that have: ? Saturated fats. ? Trans fats. ? Added sugars.  Avoid foods that have more than 300 milligrams (mg) of sodium per serving. Limit your sodium intake to less than 2,300 mg each day. Shopping  Avoid buying pre-made and processed foods.  Avoid buying drinks with added sugar. Cooking  Cook with olive oil. Do not use butter, lard, or ghee.  Bake, broil, grill, steam, or boil foods. Avoid frying. Meal planning  Work with your dietitian to create an eating plan that is right for you. This may include tracking how many calories you take in each day. Use a food diary, notebook, or mobile application to track what you eat at each meal.  Consider following a Mediterranean diet. This includes: ? Eating several servings of fresh fruits and vegetables each day. ? Eating fish at least twice a week. ? Eating one serving each day of whole grains, beans, nuts, and seeds. ? Using olive oil instead of other fats. ? Limiting alcohol. ? Limiting red meat. ? Using nonfat or low-fat dairy products.  Consider following a plant-based diet. This includes dietary choices that focus on eating mostly vegetables and fruit, grains, beans, nuts, and seeds.  If you have high blood pressure, you may need to limit your sodium intake or follow a diet such as the DASH (Dietary Approaches to Stop Hypertension) eating plan. The DASH diet aims to lower high blood pressure.   Lifestyle  Set weight loss goals with help from your health care team. It is recommended that most people with prediabetes lose 7% of their body weight.  Exercise for at least 30 minutes 5 or more days a week.  Attend a  support group or seek support from a mental health counselor.  Take over-the-counter and prescription medicines only as told by your health care provider. What foods are recommended? Fruits Berries. Bananas. Apples. Oranges. Grapes. Papaya. Mango. Pomegranate. Kiwi. Grapefruit. Cherries. Vegetables Lettuce. Spinach. Peas. Beets. Cauliflower. Cabbage. Broccoli. Carrots. Tomatoes. Squash. Eggplant. Herbs. Peppers. Onions. Cucumbers. Brussels sprouts. Grains Whole grains, such as whole-wheat or whole-grain breads, crackers, cereals, and pasta. Unsweetened oatmeal. Bulgur. Barley. Quinoa. Brown rice. Corn or whole-wheat flour tortillas or taco shells. Meats and other proteins Seafood. Poultry without skin. Lean cuts of pork and beef. Tofu. Eggs. Nuts. Beans. Dairy Low-fat or fat-free dairy products, such as yogurt, cottage cheese, and cheese. Beverages Water. Tea. Coffee. Sugar-free or diet soda. Seltzer water. Low-fat or nonfat milk. Milk alternatives, such as soy or almond milk. Fats and oils Olive oil. Canola oil. Sunflower oil. Grapeseed oil. Avocado. Walnuts. Sweets and desserts Sugar-free or low-fat pudding. Sugar-free or low-fat ice cream and other frozen treats. Seasonings and condiments Herbs. Sodium-free spices. Mustard. Relish. Low-salt, low-sugar ketchup. Low-salt, low-sugar barbecue sauce. Low-fat or fat-free mayonnaise. The items listed above may not be a complete list of recommended foods and beverages. Contact a dietitian for more information. What foods are not recommended? Fruits Fruits canned with syrup. Vegetables Canned vegetables. Frozen vegetables with butter or cream sauce. Grains Refined white flour and flour products, such as bread, pasta, snack foods, and cereals. Meats and other proteins Fatty cuts of meat. Poultry with skin. Breaded or fried meat. Processed meats. Dairy Full-fat yogurt, cheese, or milk. Beverages Sweetened drinks, such as iced tea and  soda. Fats and oils Butter. Lard. Ghee. Sweets and desserts Baked goods, such as cake, cupcakes, pastries, cookies, and cheesecake. Seasonings and condiments Spice mixes with added salt. Ketchup. Barbecue sauce. Mayonnaise. The items listed above may not be a complete list of foods and beverages that are not recommended. Contact a dietitian for more information. Where to find more information  American Diabetes Association: www.diabetes.org Summary  You may need to make diet and lifestyle changes to help prevent the onset of diabetes. These changes can help you control blood sugar, improve cholesterol levels, and manage blood pressure.  Set weight loss goals with help from your health care team. It is recommended that most people with prediabetes lose 7% of their body weight.  Consider following a Mediterranean diet. This includes eating plenty of fresh fruits and vegetables, whole grains, beans, nuts, seeds, fish, and low-fat dairy, and using olive oil instead of other fats. This information is not intended to replace advice given to you by your health care provider. Make sure you discuss any questions you have with your health care provider. Document Revised: 10/13/2019 Document Reviewed: 10/13/2019 Elsevier Patient Education  [place].

## 2020-08-08 LAB — MICROALBUMIN / CREATININE URINE RATIO
Creatinine, Urine: 72 mg/dL (ref 20–275)
Microalb Creat Ratio: 6 mcg/mg creat (ref <30)
Microalb, Ur: 0.4 mg/dL

## 2020-08-08 LAB — URINALYSIS, ROUTINE W REFLEX MICROSCOPIC
Bacteria, UA: NONE SEEN /HPF
Bilirubin Urine: NEGATIVE
Glucose, UA: NEGATIVE
Hgb urine dipstick: NEGATIVE
Hyaline Cast: NONE SEEN /LPF
Ketones, ur: NEGATIVE
Nitrite: NEGATIVE
Protein, ur: NEGATIVE
Specific Gravity, Urine: 1.014 (ref 1.001–1.03)
Squamous Epithelial / HPF: NONE SEEN /HPF (ref <=5)
pH: <=5 (ref 5.0–8.0)

## 2020-08-08 LAB — LIPID PANEL
Cholesterol: 166 mg/dL (ref <200)
HDL: 74 mg/dL (ref >=50)
LDL Cholesterol (Calc): 72 mg/dL (calc)
Non-HDL Cholesterol (Calc): 92 mg/dL (calc) (ref <130)
Total CHOL/HDL Ratio: 2.2 (calc) (ref <5.0)
Triglycerides: 114 mg/dL (ref <150)

## 2020-08-08 LAB — CBC WITH DIFFERENTIAL/PLATELET
Absolute Monocytes: 447 cells/uL (ref 200–950)
Basophils Absolute: 50 cells/uL (ref 0–200)
Basophils Relative: 0.8 %
Eosinophils Absolute: 158 cells/uL (ref 15–500)
Eosinophils Relative: 2.5 %
HCT: 43.8 % (ref 35.0–45.0)
Hemoglobin: 14.2 g/dL (ref 11.7–15.5)
Lymphs Abs: 1745 cells/uL (ref 850–3900)
MCH: 28.8 pg (ref 27.0–33.0)
MCHC: 32.4 g/dL (ref 32.0–36.0)
MCV: 88.8 fL (ref 80.0–100.0)
MPV: 10.2 fL (ref 7.5–12.5)
Monocytes Relative: 7.1 %
Neutro Abs: 3900 cells/uL (ref 1500–7800)
Neutrophils Relative %: 61.9 %
Platelets: 211 10*3/uL (ref 140–400)
RBC: 4.93 10*6/uL (ref 3.80–5.10)
RDW: 13.1 % (ref 11.0–15.0)
Total Lymphocyte: 27.7 %
WBC: 6.3 10*3/uL (ref 3.8–10.8)

## 2020-08-08 LAB — COMPLETE METABOLIC PANEL WITH GFR
AG Ratio: 1.9 (calc) (ref 1.0–2.5)
ALT: 28 U/L (ref 6–29)
AST: 31 U/L (ref 10–35)
Albumin: 4.5 g/dL (ref 3.6–5.1)
Alkaline phosphatase (APISO): 60 U/L (ref 37–153)
BUN: 18 mg/dL (ref 7–25)
CO2: 25 mmol/L (ref 20–32)
Calcium: 10 mg/dL (ref 8.6–10.4)
Chloride: 105 mmol/L (ref 98–110)
Creat: 0.95 mg/dL (ref 0.50–0.99)
GFR, Est African American: 71 mL/min/{1.73_m2} (ref >=60)
GFR, Est Non African American: 61 mL/min/{1.73_m2} (ref >=60)
Globulin: 2.4 g/dL (calc) (ref 1.9–3.7)
Glucose, Bld: 96 mg/dL (ref 65–99)
Potassium: 4.7 mmol/L (ref 3.5–5.3)
Sodium: 139 mmol/L (ref 135–146)
Total Bilirubin: 0.6 mg/dL (ref 0.2–1.2)
Total Protein: 6.9 g/dL (ref 6.1–8.1)

## 2020-08-08 LAB — MAGNESIUM: Magnesium: 2.4 mg/dL (ref 1.5–2.5)

## 2020-08-08 LAB — VITAMIN B12: Vitamin B-12: 431 pg/mL (ref 200–1100)

## 2020-08-08 LAB — TSH: TSH: 1.06 mIU/L (ref 0.40–4.50)

## 2020-08-23 ENCOUNTER — Other Ambulatory Visit: Payer: Self-pay | Admitting: Internal Medicine

## 2020-08-23 DIAGNOSIS — J0140 Acute pansinusitis, unspecified: Secondary | ICD-10-CM

## 2020-08-23 MED ORDER — AZITHROMYCIN 250 MG PO TABS: ORAL_TABLET | ORAL | 0 refills | Status: DC

## 2020-08-23 MED ORDER — DEXAMETHASONE 4 MG PO TABS: ORAL_TABLET | ORAL | 0 refills | Status: DC

## 2020-08-23 MED ORDER — PSEUDOEPHEDRINE HCL ER 120 MG PO TB12: ORAL_TABLET | ORAL | 0 refills | Status: DC

## 2020-08-29 ENCOUNTER — Other Ambulatory Visit: Payer: Self-pay | Admitting: *Deleted

## 2020-08-29 DIAGNOSIS — Z85118 Personal history of other malignant neoplasm of bronchus and lung: Secondary | ICD-10-CM

## 2020-10-04 ENCOUNTER — Other Ambulatory Visit: Payer: Self-pay

## 2020-10-04 ENCOUNTER — Ambulatory Visit (INDEPENDENT_AMBULATORY_CARE_PROVIDER_SITE_OTHER): Payer: Medicare Other | Admitting: Cardiothoracic Surgery

## 2020-10-04 ENCOUNTER — Ambulatory Visit
Admission: RE | Admit: 2020-10-04 | Discharge: 2020-10-04 | Disposition: A | Discharge status: Home / Self Care | Payer: Medicare Other | Source: Ambulatory Visit | Attending: Cardiothoracic Surgery | Admitting: Cardiothoracic Surgery

## 2020-10-04 ENCOUNTER — Encounter: Payer: Self-pay | Admitting: Cardiothoracic Surgery

## 2020-10-04 VITALS — BP 112/72 | HR 63 | Temp 97.4°F | Resp 20 | Ht 64.0 in | Wt 189.4 lb

## 2020-10-04 DIAGNOSIS — Z85118 Personal history of other malignant neoplasm of bronchus and lung: Secondary | ICD-10-CM | POA: Diagnosis not present

## 2020-10-04 DIAGNOSIS — R918 Other nonspecific abnormal finding of lung field: Secondary | ICD-10-CM | POA: Diagnosis not present

## 2020-10-04 DIAGNOSIS — Z902 Acquired absence of lung [part of]: Secondary | ICD-10-CM | POA: Diagnosis not present

## 2020-10-04 NOTE — Progress Notes (Signed)
[address]       [place] [address]             [phone]      Karen Barrera [facility] Medical Record #[id number] Date of Birth: 1950-10-28  Referring: Deneise Lever, MD Primary Care: [name], MD Primary Cardiologist: Shelva Majestic, MD   Chief Complaint:   POST OP FOLLOW UP 09/28/2018 PREOPERATIVE DIAGNOSIS:  Left upper lobe lung mass. POSTOPERATIVE DIAGNOSIS:  Left upper lobe lung mass, nonsmall-cell lung cancer by frozen section. PROCEDURE PERFORMED:  Bronchoscopy, left video-assisted thoracoscopy, wedge resection, left upper lobe, completion left upper lobectomy with lymph node dissection and intercostal nerve blocks. SURGEON:  Lanelle Bal, MD  Cancer Staging Lung cancer [facility]) Staging form: Lung, AJCC 8th Edition - Pathologic stage from 09/29/2018: Stage IA2 (pT1b, pN0, cM0) - Signed by Grace Isaac, MD on 09/29/2018  History of Present Illness:      Patient doing well now approximately  2 years after resection of left upper lobe lung mass stage Ia 2 non-small cell carcinoma of the lung-currently under observation only.  Patient has continued to be smoke-free.  She stopped smoking just prior to her lung resection.  She notes she has had low chronic back pain but denies any upper thoracic spine or neck pain.    Recent breast biopsy done in January 2022-no malignancy identified.  The patient still notes that since her surgery she has not been able to taste-she would like this evaluated we we will refer her to ENT for further evaluation.   Past Medical History:  Diagnosis Date  . Acute perforated appendicitis 10/31/2014  . Adenomatous colon polyp   . Allergic rhinitis   . Allergy   . Anemia   . Arthritis    "fingers" (10/31/2014)  . Chronic bronchitis ([place])    "got it q yr til I had my nose OR" (10/31/2014)  . COPD (chronic obstructive pulmonary disease) ([place])   . Coronary artery disease   . DDD (degenerative disc  disease), cervical   . DDD (degenerative disc disease), lumbar   . Depression    "short term when my sister passed away unexpectedly"  . Family history of adverse reaction to anesthesia    "mother is hard to wake up; a little goes a long way w/her"  . Family history of breast cancer   . Family history of colon cancer   . Family history of skin cancer   . GERD (gastroesophageal reflux disease)   . History of stomach ulcers   . Hyperlipidemia    "borderline; RX is preventative" (10/31/2014)  . Kidney stones    "they passed"  . Lung cancer ([place]) 09/28/2018  . Migraine    "used to get them alot; don't get them anymore" (10/31/2014)  . Osteoarthritis of both knees   . Osteopenia   . Pneumonia 2003 X 1  . Pre-diabetes   . Seizures ([place]) ~ 1962 X 1   "from an abscessed wisdom tooth"  . Tobacco dependence   . Vitamin D deficiency      Social History   Tobacco Use  Smoking Status Former Smoker  . Packs/day: 1.00  . Years: 50.00  . Pack years: 50.00  . Types: Cigarettes  . Quit date: 09/27/2018  . Years since quitting: 2.0  Smokeless Tobacco Never Used  Tobacco Comment   Quit 09/27/2018    Social History   Substance and Sexual Activity  Alcohol Use No  .  Alcohol/week: 0.0 standard drinks     Allergies  Allergen Reactions  . Chantix [Varenicline] Anaphylaxis and Swelling    Makes throat swell up  . Moxifloxacin Hcl In Nacl Other (See Comments)    aches    Current Outpatient Medications  Medication Sig Dispense Refill  . albuterol (PROVENTIL) (2.5 MG/3ML) 0.083% nebulizer solution Take 3 mLs (2.5 mg total) by nebulization every 6 (six) hours as needed for wheezing or shortness of breath. 75 mL 12  . albuterol (VENTOLIN HFA) 108 (90 Base) MCG/ACT inhaler USE 2 INHALATIONS 25 MINUTES APART EVERY 4 HOURS IF NEEDED TO RESCUE ASTHMA ATTACK (Patient taking differently: Inhale 2 puffs into the lungs every 4 (four) hours as needed for wheezing or shortness of breath (Asthma Attack).  25 MINUTES APART) 8.5 g 6  . aspirin EC 81 MG tablet Take 81 mg by mouth daily.    Marland Kitchen azithromycin (ZITHROMAX) 250 MG tablet Take 2 tablets with Food on  Day 1, then 1 tablet Daily with Food for Sinusitis / Bronchitis 6 each 0  . Calcium Carbonate Antacid (CALCIUM CARBONATE PO) Take 1,200 mg by mouth daily with breakfast.    . cetirizine (ZYRTEC) 10 MG tablet Take 10 mg by mouth daily.    . Cholecalciferol (VITAMIN D-3) 125 MCG (5000 UT) TABS Take 10,000 Units by mouth daily. Please discuss dosage with medical doctor as is a high dose (Patient taking differently: Take 5,000 Units by mouth daily. Please discuss dosage with medical doctor as is a high dose) 30 tablet 0  . dexamethasone (DECADRON) 4 MG tablet Take 1 tab 3 x /day for 2 days,      then 2 x /day for 2  Days,     then 1 tab daily 13 tablet 0  . diphenhydrAMINE (BENADRYL) 25 MG tablet Take 25 mg by mouth daily.    Marland Kitchen ibuprofen (ADVIL,MOTRIN) 200 MG tablet Take 600 mg by mouth every 8 (eight) hours as needed for headache or moderate pain.     . metoprolol succinate (TOPROL-XL) 25 MG 24 hr tablet TAKE 1 TABLET BY MOUTH EVERY DAY (Patient taking differently: Take 25 mg by mouth daily.) 90 tablet 3  . montelukast (SINGULAIR) 10 MG tablet TAKE 1 TABLET DAILY FOR    ALLERGIES (Patient taking differently: Take 10 mg by mouth daily.) 90 tablet 3  . omeprazole (PRILOSEC) 40 MG capsule Take 1 capsule 2 x  /day with Bkfst & Supper for Heartburn & Reflux (Patient taking differently: Take 40 mg by mouth daily as needed (Bkfst & Supper for Heartburn & Reflux).) 180 capsule 3  . pseudoephedrine (SUDAFED) 120 MG 12 hr tablet Take  1 tablet  2 x /day (every 12 hours)   for Head and Chest Congestion 60 tablet 0  . rosuvastatin (CRESTOR) 40 MG tablet TAKE ONE TABLET BY MOUTH DAILY (Patient taking differently: Take 40 mg by mouth daily.) 90 tablet 3  . sertraline (ZOLOFT) 100 MG tablet TAKE 1 TABLET BY MOUTH DAILY FOR MOOD (Patient taking differently: Take 100 mg  by mouth daily.) 90 tablet 3  . traMADol (ULTRAM) 50 MG tablet Take 1 tablet (50 mg total) by mouth every 6 (six) hours as needed for moderate pain. 20 tablet 0  . zinc gluconate 50 MG tablet Take 50 mg by mouth daily.     No current facility-administered medications for this visit.       Physical Exam: BP 112/72 (BP Location: Left Arm, Patient Position: Sitting, Cuff Size: Normal)  Pulse 63   Temp (!) 97.4 F (36.3 C) (Skin)   Resp 20   Ht 5\' 4"  (1.626 m)   Wt 189 lb 6.4 oz (85.9 kg)   SpO2 96% Comment: RA  BMI 32.51 kg/m  General appearance: alert, cooperative and no distress Neck: no adenopathy, no carotid bruit, no JVD, supple, symmetrical, trachea midline and thyroid not enlarged, symmetric, no tenderness/mass/nodules Lymph nodes: Cervical, supraclavicular, and axillary nodes normal. Resp: clear to auscultation bilaterally Cardio: regular rate and rhythm, S1, S2 normal, no murmur, click, rub or gallop GI: soft, non-tender; bowel sounds normal; no masses,  no organomegaly Extremities: extremities normal, atraumatic, no cyanosis or edema Neurologic: Grossly normal , patient does note that she is been unable to taste food well since her lung surgery.   Diagnostic Studies & Laboratory data:     Recent Radiology Findings:  CT CHEST WO CONTRAST  Result Date: 10/04/2020 CLINICAL DATA:  Follow-up lung cancer, status post left upper lobectomy EXAM: CT CHEST WITHOUT CONTRAST TECHNIQUE: Multidetector CT imaging of the chest was performed following the standard protocol without IV contrast. COMPARISON:  04/26/2020 FINDINGS: Cardiovascular: Aortic atherosclerosis. Normal heart size. Three-vessel coronary artery calcifications. No pericardial effusion. Mediastinum/Nodes: No enlarged mediastinal, hilar, or axillary lymph nodes. Thyroid gland, trachea, and esophagus demonstrate no significant findings. Lungs/Pleura: Redemonstrated postoperative findings status post left upper lobectomy.  Background of very fine centrilobular pulmonary nodules, most conspicuous in the remaining apical right upper lobe. No pleural effusion or pneumothorax. Upper Abdomen: No acute abnormality. Hepatic steatosis. Unchanged, focal hypodense lesion of the posterior liver dome measuring approximately 2.4 x 1.7 cm, previously characterized as a benign cyst and not FDG PET avid (series 2, image 113). Unchanged focal hyperdense lesion of the caudate measuring 3.0 x 3.0 cm (series 2, image 132). Musculoskeletal: No chest wall mass or suspicious bone lesions identified. Unchanged degenerative endplate sclerosis of C6 and C7, partially imaged (series 6, image 107). IMPRESSION: 1. Redemonstrated postoperative findings status post left upper lobectomy. No evidence of recurrent or metastatic disease in the chest. 2. Background of very fine centrilobular pulmonary nodules, most conspicuous in the remaining apical right upper lobe, consistent with smoking-related respiratory bronchiolitis. 3. Hepatic steatosis. 4. Unchanged focal hyperdense lesion of the caudate measuring 3.0 cm, which may again reflect focal fatty sparing or a parenchymal liver lesion such as an adenoma or focal nodular hyperplasia. As on prior examination, consider MRI to definitively characterize. 5. Coronary artery disease. Aortic Atherosclerosis (ICD10-I70.0). Electronically Signed   By: Eddie Candle M.D.   On: 10/04/2020 10:53   I have independently reviewed the above radiology studies  and reviewed the findings with the patient.   CT Chest Wo Contrast  Result Date: 04/26/2020 CLINICAL DATA:  October 20, 2019 FINDINGS: Cardiovascular: Calcified atheromatous plaque of the thoracic aorta. No aneurysmal dilation. Central pulmonary vasculature unchanged with respect caliber. No pericardial effusion. Three-vessel coronary artery disease as before. Limited assessment of cardiovascular structures given lack of intravenous contrast. Mediastinum/Nodes: No  adenopathy in the chest. Esophagus grossly normal. Lungs/Pleura: Post LEFT upper lobectomy with bandlike scarring in the LEFT lower lobe extending anteriorly towards the anterior LEFT upper chest. Small nodular focus along these bandlike changes is unchanged approximately 4 mm (image 50, series 8) no effusion. No acute consolidative changes or airway abnormality. Upper Abdomen: Severe hepatic steatosis. Stable low-density lesion along the posterior RIGHT hemi liver compatible with a cyst is unchanged dating back to 2016 rounded area in the caudate lobe measuring 2.7 x 1.9  cm with variable attenuation with respect to the liver parenchyma was present as far back is 2020 but showed no FDG uptake. Adrenal thickening on the LEFT is unchanged and did not show FDG uptake on previous PET imaging. No acute upper abdominal process. Musculoskeletal: No acute musculoskeletal process. Some sclerosis at T1 posteriorly is unchanged mild sternal sclerosis of the sternal body in the upper margin of the sternum is also not changed dating back to January of 2020 showing no increased metabolic activity on prior PET. IMPRESSION: 1. Post LEFT upper lobectomy with bandlike scarring and associated small nodule in the LEFT lower lobe extending anteriorly towards the anterior LEFT upper chest. Findings are not changed. 2. Severe hepatic steatosis. Area of variable attenuation in the caudate lobe is been present over multiple prior studies and may represent variable fat deposition/focal fatty sparing and did not show FDG uptake on the August 31, 2018 PET exam. Given more focal appearance and slightly atypical location may consider MRI follow-up for further assessment to exclude small lesions such as hepatic adenoma. 3. Areas of sclerosis in C6 and C7 as well as posterior aspect of T1 and the sternum not changed over time. Continued attention on follow-up is suggested. 4. Three-vessel coronary artery disease as before. 5. Aortic  atherosclerosis. Aortic Atherosclerosis (ICD10-I70.0). Electronically Signed   By: Zetta Bills M.D.   On: 04/26/2020 10:23    Recent Lab Findings:  Lab Results  Component Value Date   WBC 6.3 08/07/2020   HGB 14.2 08/07/2020   HCT 43.8 08/07/2020   PLT 211 08/07/2020   GLUCOSE 96 08/07/2020   CHOL 166 08/07/2020   TRIG 114 08/07/2020   HDL 74 08/07/2020   LDLCALC 72 08/07/2020   ALT 28 08/07/2020   AST 31 08/07/2020   NA 139 08/07/2020   K 4.7 08/07/2020   CL 105 08/07/2020   CREATININE 0.95 08/07/2020   BUN 18 08/07/2020   CO2 25 08/07/2020   TSH 1.06 08/07/2020   INR 1.1 09/24/2018   HGBA1C 6.0 (H) 07/24/2020      Assessment / Plan:    #1 status post left upper lobectomy for stage I non-small cell lung cancer-CT scan done today shows no evidence of recurrence now 2 years postop. -Continue observation with follow-up CT of the chest in 1 year  #2  Persistent loss of taste-unrelated to Covid-we will refer the patient to ENT for further evaluation  #3 incidental finding on CT - unchanged focal hyperdense lesion of the caudate measuring 3.0 cm, which may again reflect focal fatty sparing or a parenchymal liver lesion such as an adenoma or focal nodular hyperplasia.-This finding has been present and unchanged for several years-the patient was not interested in a follow-up MRI    Grace Isaac MD      [address] [place] [address] Office [phone]   Beeper [phone]  10/08/2020 2:05 PM

## 2020-10-11 ENCOUNTER — Other Ambulatory Visit: Payer: Self-pay | Admitting: Cardiovascular Disease

## 2020-10-16 NOTE — Progress Notes (Signed)
HPI female smoker followed for allergic rhinitis, recurrent acute bronchitis, tobacco use Allergy Profile 06/16/13 Total IgE 41.7   Pos dust mite, dog, [place] grass, ragwee Office Spirometry 06/13/2015-within normal limits. FVC 3.55/117%, FEV1 2.64/111%, FEV1/FVC 0.74 PFT 10/31/2013-within normal limits with no response to bronchodilator. FVC 3.91/120%, FEV1 3.08/123%, FEV1/FEC 0.79, FEF 25-75% 2.90/129%. TLC 113%, DLCO 86%. 6 minute walk test 10/31/2013-94%, 97%, 97%, 480 m. Normal oxygenation. Office Spirometry 06/13/2015-within normal limits. FVC 3.55/117%, FEV1 2.64/111%, FEV1/FVC 0.74 PFT 09/21/2018- minimal obstruction, insig resp to BD. FVC 3.51/ 113%, FEV1 2.69/ 113%, R .77, TLC 118%, DLCO 96% ----------------------------------------------------------------------------------------------   10/18/19-  70 year old female former (quit 2020) smoker followed for allergic rhinitis, Chronic Bronchitis, tobacco use, LUL VATS 09/28/18 for non-SCCa/ Dr Servando Snare, CAD ------f/u COPD. Breathing is at her baseline.  Body weight today 199 lbs 1 Covax No recent use of rescue inhaler, continues Anoro She considers her breathing "excellent" for her, with no exacerbation all winter, thanks to restrictions.  Pending f/u CT this week, per Dr Servando Snare, f/u p lobectomy.  Had heart cath before that. We reviewed CT images and report.  Says she lost her sense of taste, except for sweets, after lobectomy.  CT chest 04/14/2019- 1. No evidence of residual or recurrent tumor status post left upper lobectomy. 2. No findings highly suspicious for metastatic disease in the chest. Tiny 3 mm peripheral left lower lobe solid pulmonary nodule, not definitely seen on preoperative chest CT, although potentially obscured by atelectasis on prior CT. Attention on surveillance chest CT advised. 3. Diffuse hepatic steatosis. 4. Three-vessel coronary atherosclerosis. Aortic Atherosclerosis (ICD10-I70.0).  10/17/20- 70 year old  female former (quit 2020) smoker followed for allergic rhinitis, COPD, tobacco use, LUL VATS 09/28/18 for non-SCCa/ Dr Servando Snare, CAD, Aortic Atherosclerosis, GERD, DDD-cervical/lumbar, Obesity -Neb albuterol, Ventolin hfa, Singulair,  Covid vax-3 Phizer Flu vax-had -----Pt feeling good overall. Ct scan done 10/04/20 no concerns. Recent breast biopsy- benign. Feeling very well, encouraged by recent good reports. Hasn't needed to use inhalers. Chest feels clear without cough. Remains off tobacco x 2 years. CT chest 10/04/20-  IMPRESSION: 1. Redemonstrated postoperative findings status post left upper lobectomy. No evidence of recurrent or metastatic disease in the chest. 2. Background of very fine centrilobular pulmonary nodules, most conspicuous in the remaining apical right upper lobe, consistent with smoking-related respiratory bronchiolitis. 3. Hepatic steatosis. 4. Unchanged focal hyperdense lesion of the caudate measuring 3.0 cm, which may again reflect focal fatty sparing or a parenchymal liver lesion such as an adenoma or focal nodular hyperplasia. As on prior examination, consider MRI to definitively characterize. 5. Coronary artery disease. Aortic Atherosclerosis (ICD10-I70.0).   ROS-see HPI + = positive Constitutional:   No-   weight loss, night sweats, fevers, chills, fatigue, lassitude. HEENT:   +headaches, difficulty swallowing, tooth/dental problems, sore throat,       sneezing,no- itching, ear ache, +nasal congestion, +post nasal drip,  CV:  No-   chest pain, orthopnea, PND, swelling in lower extremities, anasarca,  dizziness, palpitations Resp: No-   shortness of breath with exertion or at rest.             productive cough, +non-productive cough,  No- coughing up of blood.              No-   change in color of mucus.  No- wheezing Skin: No-   rash or lesions. GI:  No-   heartburn, indigestion, abdominal pain, nausea, vomiting,  GU:  MS:  No-   joint pain or swelling.  Neuro-     nothing unusual Psych:  No- change in mood or affect. No depression or anxiety.  No memory loss.  OBJ- Physical Exam General- Alert, Oriented, Affect-appropriate, Distress- none acute,  + overweight Skin- rash-none, lesions- none, excoriation- none Lymphadenopathy- none Head- atraumatic            Eyes- Gross vision intact, PERRLA, conjunctivae and secretions clear            Ears- Hearing, canals-normal            Nose- Clear, no-Septal dev, mucus, polyps, erosion, perforation             Throat- Mallampati II , mucosa clear , drainage- none, tonsils- atrophic Neck- flexible , trachea midline, no stridor , thyroid nl, carotid no bruit Chest - symmetrical excursion , unlabored           Heart/CV- RRR , no murmur , no gallop  , no rub, nl s1 s2                           - JVD- none , edema- none, stasis changes- none, varices- none           Lung- + clear, wheeze -none, cough- none , dullness-none, rub- none           Chest wall-  Abd-  Br/ Gen/ Rectal- Not done, not indicated Extrem- cyanosis- none, clubbing, none, atrophy- none, strength- nl Neuro- grossly intact to observation

## 2020-10-17 ENCOUNTER — Encounter: Payer: Self-pay | Admitting: Internal Medicine

## 2020-10-17 ENCOUNTER — Ambulatory Visit (INDEPENDENT_AMBULATORY_CARE_PROVIDER_SITE_OTHER): Payer: Medicare Other | Admitting: Internal Medicine

## 2020-10-17 ENCOUNTER — Other Ambulatory Visit: Payer: Self-pay

## 2020-10-17 DIAGNOSIS — J449 Chronic obstructive pulmonary disease, unspecified: Secondary | ICD-10-CM

## 2020-10-17 DIAGNOSIS — J3089 Other allergic rhinitis: Secondary | ICD-10-CM

## 2020-10-17 DIAGNOSIS — J302 Other seasonal allergic rhinitis: Secondary | ICD-10-CM

## 2020-10-17 NOTE — Assessment & Plan Note (Signed)
Anticipate long-term surveillance.  Plan- we will leave current meds on her list

## 2020-10-17 NOTE — Patient Instructions (Signed)
I'm glad you are feeling well and getting good reports!  Please call if we can help

## 2020-10-17 NOTE — Assessment & Plan Note (Signed)
Not reporting any significant seasonal rhinitis yet. Will be able to use otc antihistamines/ flonase if needed

## 2020-10-23 ENCOUNTER — Ambulatory Visit (INDEPENDENT_AMBULATORY_CARE_PROVIDER_SITE_OTHER): Payer: Medicare Other | Admitting: Otolaryngology

## 2020-10-29 DIAGNOSIS — D071 Carcinoma in situ of vulva: Secondary | ICD-10-CM | POA: Diagnosis not present

## 2020-10-29 DIAGNOSIS — N901 Moderate vulvar dysplasia: Secondary | ICD-10-CM | POA: Diagnosis not present

## 2020-10-29 DIAGNOSIS — N9089 Other specified noninflammatory disorders of vulva and perineum: Secondary | ICD-10-CM | POA: Diagnosis not present

## 2020-11-08 ENCOUNTER — Encounter (INDEPENDENT_AMBULATORY_CARE_PROVIDER_SITE_OTHER): Payer: Self-pay | Admitting: Otolaryngology

## 2020-11-08 ENCOUNTER — Other Ambulatory Visit: Payer: Self-pay

## 2020-11-08 ENCOUNTER — Ambulatory Visit (INDEPENDENT_AMBULATORY_CARE_PROVIDER_SITE_OTHER): Payer: Medicare Other | Admitting: Otolaryngology

## 2020-11-08 VITALS — Temp 93.2°F

## 2020-11-08 DIAGNOSIS — R438 Other disturbances of smell and taste: Secondary | ICD-10-CM

## 2020-11-08 NOTE — Progress Notes (Signed)
HPI: Karen Barrera is a 70 y.o. female who presents is referred by Dr. Servando Snare for evaluation of decreased sense of smell and taste ever since she had surgery 2 years ago on 09/28/2018 where she had a pneumonectomy performed.  Following surgery she noticed that she lost her sense of taste and partial loss of sense of smell.  She denies any documented history of Covid.  She is having no trouble breathing through her nose. She has no difficulty eating or swallowing in fact she has gained weight over the past couple of years..  Past Medical History:  Diagnosis Date  . Acute perforated appendicitis 10/31/2014  . Adenomatous colon polyp   . Allergic rhinitis   . Allergy   . Anemia   . Arthritis    "fingers" (10/31/2014)  . Chronic bronchitis ([place])    "got it q yr til I had my nose OR" (10/31/2014)  . COPD (chronic obstructive pulmonary disease) ([place])   . Coronary artery disease   . DDD (degenerative disc disease), cervical   . DDD (degenerative disc disease), lumbar   . Depression    "short term when my sister passed away unexpectedly"  . Family history of adverse reaction to anesthesia    "mother is hard to wake up; a little goes a long way w/her"  . Family history of breast cancer   . Family history of colon cancer   . Family history of skin cancer   . GERD (gastroesophageal reflux disease)   . History of stomach ulcers   . Hyperlipidemia    "borderline; RX is preventative" (10/31/2014)  . Kidney stones    "they passed"  . Lung cancer ([place]) 09/28/2018  . Migraine    "used to get them alot; don't get them anymore" (10/31/2014)  . Osteoarthritis of both knees   . Osteopenia   . Pneumonia 2003 X 1  . Pre-diabetes   . Seizures ([place]) ~ 1962 X 1   "from an abscessed wisdom tooth"  . Tobacco dependence   . Vitamin D deficiency    Past Surgical History:  Procedure Laterality Date  . APPENDECTOMY    . ARTHROTOMY Right 01/11/2016   Procedure: DISTAL INTERPHALANGEAL JOINT ARTHROTOMY RIGHT  INDEX FINGER;  Surgeon: Leanora Cover, MD;  Location: [facility];  Service: Orthopedics;  Laterality: Right;  . BREAST LUMPECTOMY WITH RADIOACTIVE SEED LOCALIZATION Right 08/01/2020   Procedure: RIGHT BREAST LUMPECTOMY WITH RADIOACTIVE SEED LOCALIZATION;  Surgeon: [name], MD;  Location: [facility];  Service: General;  Laterality: Right;  LMA  . COLONOSCOPY    . CYST EXCISION Right 01/11/2016   Procedure: EXCISION MUCOID CYST;  Surgeon: Leanora Cover, MD;  Location: [facility];  Service: Orthopedics;  Laterality: Right;  . GANGLION CYST EXCISION Right 02/21/2020   Procedure: REMOVAL GANGLION OF WRIST;  Surgeon: Leanora Cover, MD;  Location: [facility];  Service: Orthopedics;  Laterality: Right;  . LAPAROSCOPIC APPENDECTOMY N/A 10/31/2014   Procedure: APPENDECTOMY LAPAROSCOPIC;  Surgeon: Alphonsa Overall, MD;  Location: [facility];  Service: General;  Laterality: N/A;  . LEFT HEART CATH AND CORONARY ANGIOGRAPHY N/A 09/06/2018   Procedure: LEFT HEART CATH AND CORONARY ANGIOGRAPHY;  Surgeon: Troy Sine, MD;  Location: [facility];  Service: Cardiovascular;  Laterality: N/A;  . POLYPECTOMY    . SALPINGOOPHORECTOMY Right 1999   "w/grapefruit-sized polyp"  . SHOULDER ARTHROSCOPY Right 2003   removal spurs  . SHOULDER ARTHROSCOPY W/ ROTATOR CUFF REPAIR Left 2012  .  SINUS EXPLORATION  12/2013   [facility]  . TENDON REPAIR Left 10/2009   torn tendon @ elbow  . VAGINAL HYSTERECTOMY  1978  . VIDEO ASSISTED THORACOSCOPY (VATS)/WEDGE RESECTION Left 09/28/2018   Procedure: LEFT VIDEO ASSISTED THORACOSCOPY (VATS)/Completion Left Upper lobe Lobectomy. Lymph node dissection. Intercostal nerve block.;  Surgeon: Grace Isaac, MD;  Location: [facility];  Service: Thoracic;  Laterality: Left;  Marland Kitchen VIDEO BRONCHOSCOPY N/A 09/28/2018   Procedure: VIDEO BRONCHOSCOPY;  Surgeon: Grace Isaac, MD;  Location: [facility];  Service: Thoracic;  Laterality: N/A;   Social History    Socioeconomic History  . Marital status: Married    Spouse name: Not on file  . Number of children: Not on file  . Years of education: Not on file  . Highest education level: Not on file  Occupational History  . Occupation: Retired  Tobacco Use  . Smoking status: Former Smoker    Packs/day: 1.00    Years: 50.00    Pack years: 50.00    Types: Cigarettes    Quit date: 09/27/2018    Years since quitting: 2.1  . Smokeless tobacco: Never Used  . Tobacco comment: Quit 09/27/2018  Vaping Use  . Vaping Use: Former  . Substances: Nicotine  Substance and Sexual Activity  . Alcohol use: No    Alcohol/week: 0.0 standard drinks  . Drug use: No  . Sexual activity: Not Currently  Other Topics Concern  . Not on file  Social History Narrative  . Not on file   Social Determinants of Health   Financial Resource Strain: Not on file  Food Insecurity: Not on file  Transportation Needs: Not on file  Physical Activity: Not on file  Stress: Not on file  Social Connections: Not on file   Family History  Problem Relation Age of Onset  . Colon cancer Other        dx 40's/50's  . Colon cancer Other 27  . Diabetes Mother   . Skin cancer Mother 98       unsure what type  . Epilepsy Sister   . Stroke Maternal Grandmother   . Hyperlipidemia Maternal Grandfather   . Arthritis Paternal Grandmother   . Melanoma Paternal Grandmother 24  . Colon polyps Father        has had about 5 every c-scope (every 5 years-ish)  . Colon cancer Other 67  . Cancer Other        type unknown  . Stomach cancer Neg Hx   . Esophageal cancer Neg Hx   . Rectal cancer Neg Hx    Allergies  Allergen Reactions  . Chantix [Varenicline] Anaphylaxis and Swelling    Makes throat swell up  . Moxifloxacin Hcl In Nacl Other (See Comments)    aches   Prior to Admission medications   Medication Sig Start Date End Date Taking? Authorizing Provider  albuterol (PROVENTIL) (2.5 MG/3ML) 0.083% nebulizer solution Take 3 mLs  (2.5 mg total) by nebulization every 6 (six) hours as needed for wheezing or shortness of breath. 10/18/19   Baird Lyons D, MD  albuterol (VENTOLIN HFA) 108 (90 Base) MCG/ACT inhaler USE 2 INHALATIONS 25 MINUTES APART EVERY 4 HOURS IF NEEDED TO RESCUE ASTHMA ATTACK Patient taking differently: Inhale 2 puffs into the lungs every 4 (four) hours as needed for wheezing or shortness of breath (Asthma Attack). [place] 04/30/20   Baird Lyons D, MD  aspirin EC 81 MG tablet Take 81 mg by mouth daily.  [provider]  Calcium Carbonate Antacid (CALCIUM CARBONATE PO) Take 1,200 mg by mouth daily with breakfast.    [provider]  cetirizine (ZYRTEC) 10 MG tablet Take 10 mg by mouth daily.    [provider]  Cholecalciferol (VITAMIN D-3) 125 MCG (5000 UT) TABS Take 10,000 Units by mouth daily. Please discuss dosage with medical doctor as is a high dose Patient taking differently: Take 5,000 Units by mouth daily. Please discuss dosage with medical doctor as is a high dose 10/06/18   Lars Pinks M, PA-C  diphenhydrAMINE (BENADRYL) 25 MG tablet Take 25 mg by mouth daily.    [provider]  ibuprofen (ADVIL,MOTRIN) 200 MG tablet Take 600 mg by mouth every 8 (eight) hours as needed for headache or moderate pain.     [provider]  metoprolol succinate (TOPROL-XL) 25 MG 24 hr tablet TAKE 1 TABLET BY MOUTH EVERY DAY 10/11/20   Troy Sine, MD  montelukast (SINGULAIR) 10 MG tablet TAKE 1 TABLET DAILY FOR    ALLERGIES Patient taking differently: Take 10 mg by mouth daily. 04/24/20   [name], MD  omeprazole (PRILOSEC) 40 MG capsule Take 1 capsule 2 x  /day with Bkfst & Supper for Heartburn & Reflux Patient taking differently: Take 40 mg by mouth daily as needed (Bkfst & Supper for Heartburn & Reflux). 03/07/19   [name], MD  pseudoephedrine (SUDAFED) 120 MG 12 hr tablet Take  1 tablet  2 x /day (every 12 hours)   for Head and Chest  Congestion 08/23/20   [name], MD  rosuvastatin (CRESTOR) 40 MG tablet TAKE ONE TABLET BY MOUTH DAILY Patient taking differently: Take 40 mg by mouth daily. 11/28/19   Troy Sine, MD  sertraline (ZOLOFT) 100 MG tablet TAKE 1 TABLET BY MOUTH DAILY FOR MOOD Patient taking differently: Take 100 mg by mouth daily. 12/02/19   Liane Comber, NP  zinc gluconate 50 MG tablet Take 50 mg by mouth daily.    [provider]     Positive ROS: Otherwise negative  All other systems have been reviewed and were otherwise negative with the exception of those mentioned in the HPI and as above.  Physical Exam: Constitutional: Alert, well-appearing, no acute distress Ears: External ears without lesions or tenderness. Ear canals are clear bilaterally with intact, clear TMs.  Nasal: External nose without lesions. Septum relatively midline. Clear nasal passages bilaterally.  No polyps noted.  No significant swelling in the superior nasal cavity. Oral: Lips and gums without lesions. Tongue and palate mucosa without lesions. Posterior oropharynx clear.  Tongue is normal to evaluation.  No signs of infection.  Tongue is soft to palpation.  Indirect laryngoscopy revealed a clear base of tongue vallecula epiglottis.  Vocal cords are clear with normal vocal cord mobility. Neck: No palpable adenopathy or masses Respiratory: Breathing comfortably  Skin: No facial/neck lesions or rash noted.  Procedures  Assessment: Loss of sense of smell and taste questionable etiology possibly related to Covid.  Plan: Not sure there is any specific treatment for this.  Reviewed with her concerning normal examination otherwise.   Radene Journey, MD   CC:

## 2020-11-26 ENCOUNTER — Ambulatory Visit: Payer: Medicare Other | Admitting: Adult Health

## 2020-12-15 ENCOUNTER — Other Ambulatory Visit: Payer: Self-pay | Admitting: Cardiovascular Disease

## 2020-12-19 ENCOUNTER — Encounter: Payer: Self-pay | Admitting: Cardiovascular Disease

## 2020-12-19 ENCOUNTER — Ambulatory Visit (INDEPENDENT_AMBULATORY_CARE_PROVIDER_SITE_OTHER): Payer: Medicare Other | Admitting: Cardiovascular Disease

## 2020-12-19 ENCOUNTER — Other Ambulatory Visit: Payer: Self-pay

## 2020-12-19 VITALS — BP 108/80 | HR 73 | Ht 64.0 in | Wt 186.0 lb

## 2020-12-19 DIAGNOSIS — J449 Chronic obstructive pulmonary disease, unspecified: Secondary | ICD-10-CM

## 2020-12-19 DIAGNOSIS — E6609 Other obesity due to excess calories: Secondary | ICD-10-CM

## 2020-12-19 DIAGNOSIS — Z6833 Body mass index (BMI) 33.0-33.9, adult: Secondary | ICD-10-CM | POA: Diagnosis not present

## 2020-12-19 DIAGNOSIS — I2510 Atherosclerotic heart disease of native coronary artery without angina pectoris: Secondary | ICD-10-CM | POA: Diagnosis not present

## 2020-12-19 DIAGNOSIS — E785 Hyperlipidemia, unspecified: Secondary | ICD-10-CM | POA: Diagnosis not present

## 2020-12-19 DIAGNOSIS — I700 Atherosclerosis of aorta: Secondary | ICD-10-CM

## 2020-12-19 DIAGNOSIS — C3492 Malignant neoplasm of unspecified part of left bronchus or lung: Secondary | ICD-10-CM | POA: Diagnosis not present

## 2020-12-19 NOTE — Patient Instructions (Signed)
Medication Instructions:  Your physician recommends that you continue on your current medications as directed. Please refer to the Current Medication list given to you today.  *If you need a refill on your cardiac medications before your next appointment, please call your pharmacy*  Follow-Up: At [facility], you and your health needs are our priority.  As part of our continuing mission to provide you with exceptional heart care, we have created designated Provider Care Teams.  These Care Teams include your primary Cardiologist (physician) and Advanced Practice Providers (APPs -  Physician Assistants and Nurse Practitioners) who all work together to provide you with the care you need, when you need it.  We recommend signing up for the patient portal called "MyChart".  Sign up information is provided on this After Visit Summary.  MyChart is used to connect with patients for Virtual Visits (Telemedicine).  Patients are able to view lab/test results, encounter notes, upcoming appointments, etc.  Non-urgent messages can be sent to your provider as well.   To learn more about what you can do with MyChart, go to [email or web address].    Your next appointment:   12 month(s)  The format for your next appointment:   In Person  Provider:   Thomas Kelly, MD      

## 2020-12-19 NOTE — Progress Notes (Signed)
Cardiology Office Note    Date:  12/19/2020   ID:  Karen Barrera, DOB Jun 13, 1951, MRN [id number]  Pulmonary: Dr. Keturah Barre PCP:  [name], MD  Cardiologist:  Shelva Majestic, MD   14 month F/u cardiology evaluation initially referred through the courtesy of Dr. Keturah Barre for evaluation of coronary calcification.  History of Present Illness:  Karen Barrera is a 70 y.o. female who is the daughter of my patient, [name].  She is followed by Dr. Melford Aase for primary care and Dr. Annamaria Boots for pulmonary.  She was initially referred for cardiology evaluation after being found to have coronary calcification.  She presents for 109-monthfollow-up cardiology evaluation.  Karen Barrera a long history of tobacco use.  She started smoking at age 89and typically has smoked 1 pack/day.  She has smoked for 57 years and stopped during her pregnancies.  She is followed by Dr. YAnnamaria Bootsfor allergic rhinitis, recurrent acute bronchitis, as well as lung nodules.  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  She had recently undergone a chest CT which showed a stable left upper lobe nodule adjacent to bulla formation.  It was recommended that she undergo unenhanced chest CT in 12 months.  On her CT she was found to have coronary artery calcifications as well as a stable right hepatic cyst.  She admits to occasional episodes of left chest wall like discomfort.  Occasionally she has noticed some sporadic chest tightness but typically this has not been exertional.  Usually is short-lived and last only 2 to 3 minutes.  In addition to her longstanding smoking history, there is family history for cardiovascular disease.  Remotely, she believes she may have had a stress test in a years ago.  She has a history of a cracked sternum approximately 3 years ago.    When I initially saw her in December 2019 she denied any exertional type of chest pain.  However, with her longstanding tobacco history, and  family history for CAD as well as documented coronary calcification I recommended a CT coronary angiogram as well as a 2D echo Doppler study.  We discussed the importance of smoking cessation.  In addition with her previous coronary calcification LDL cholesterol of 93 I recommended titration of rosuvastatin to 20 mg.  Her echo Doppler study revealed an EF of 55 to 60% with grade 1 diastolic dysfunction.  There is very mild dilation of the aortic root.  There were no valvular abnormalities.  Her CTA was abnormal and showed coronary artery calcium score of 891 placing her in the 97th percentile for age and gender.  She was felt to have possible 70 to 90% stenosis in the proximal LAD and mild stenosis in the proximal circumflex.  FFR analysis was abnormal in the mid LAD at 0.76 suggesting hemodynamically significant proximal LAD stenosis.  Chest CT also revealed an interval increase in soft tissue thickening along the posterior and lateral aspect of the left upper lobe bulla and although this could represent infectious etiology an underlying neoplasm was suspected.  As result she is to undergo subsequent PET imaging.  She again was found to have aortic atherosclerosis, coronary atherosclerosis as well as emphysema.  She was also noted to have hepatosteatosis and probable enlarging posterior right hepatic lobe cyst.   When I  saw her on August 26, 2018 I recommended further titration of rosuvastatin to 40 mg and added Toprol-XL 25 mg to her medical regimen.  She underwent definitive cardiac catheterization on September 06, 2018 by me which showed moderate coronary calcification without high-grade obstructive disease.  There was a 20 to 30% proximal to mid LAD stenosis with 55% mid stenosis in the mid vessel beyond the small diagonal vessel, more significant calcification involving the proximal left circumflex coronary artery with 20% proximal narrowing and 20% narrowing in the OM1 vessel, and 30 and 20% proximal to  mid RCA stenoses.  LVEDP was 18 mm.  She had normal LV function without focal segmental wall motion abnormalities.  Medical therapy was recommended.  Smoking cessation was essential.  Since her catheterization, she was felt to have progression of a lung nodule ultimately led to bronchoscopy with left video-assisted thoracoscopy, wedge resection, and left upper lobectomy with lymph node dissection by Dr. Darius Bump.  Postoperative diagnosis was non-small cell lung cancer by frozen section stage I A2 (pT1b, pN 0, cMO).  She did not require any adjunctive chemotherapy.  When I saw her in June 2020 at which time she was remaining stable and was without anginal symptomatology.  She had quit smoking on September 27, 2018.  Over the last 6 months, she has continued to do well.  She specifically denies chest pain or any symptoms of exertional dyspnea.  She has seen Dr. Servando Snare in follow-up evaluation of her non-small cell CA. a follow-up chest CT did not show any evidence of residual or recurrent disease.  The plan is for her to follow-up CT scan of her chest in 6 months which will be 12 months postoperatively.    Since I last saw her, she is remained stable from a cardiovascular standpoint.  She has recently seen Dr. Servando Snare on  10/04/2020 prior to his retirement.  At that time she was stable for 2 years after resection of her left upper lobe lung mass stage Ia non-small cell carcinoma of the lung, currently on observation only.  It was recommended to have a follow-up CT of the chest in 1 year.  Over the past 2 years she has had persistent loss of taste.  Of note, her surgery was the week before the hospital shutdown for COVID in March 2020.  She was recently evaluated by Dr. Radene Journey for ENT evaluation of her absence of taste.  She also has followed up with Dr. Keturah Barre on October 17, 2020.  She had follow-up evaluation with Dr. Mickie Bail in January 2022 and total cholesterol was 166, LDL 72, HDL 74, and  triglycerides 114.  She presents for evaluation.   Past Medical History:  Diagnosis Date  . Acute perforated appendicitis 10/31/2014  . Adenomatous colon polyp   . Allergic rhinitis   . Allergy   . Anemia   . Arthritis    "fingers" (10/31/2014)  . Chronic bronchitis ([place])    "got it q yr til I had my nose OR" (10/31/2014)  . COPD (chronic obstructive pulmonary disease) ([place])   . Coronary artery disease   . DDD (degenerative disc disease), cervical   . DDD (degenerative disc disease), lumbar   . Depression    "short term when my sister passed away unexpectedly"  . Family history of adverse reaction to anesthesia    "mother is hard to wake up; a little goes a long way w/her"  . Family history of breast cancer   . Family history of colon cancer   . Family history of skin cancer   . GERD (gastroesophageal reflux disease)   . History of stomach  ulcers   . Hyperlipidemia    "borderline; RX is preventative" (10/31/2014)  . Kidney stones    "they passed"  . Lung cancer ([place]) 09/28/2018  . Migraine    "used to get them alot; don't get them anymore" (10/31/2014)  . Osteoarthritis of both knees   . Osteopenia   . Pneumonia 2003 X 1  . Pre-diabetes   . Seizures ([place]) ~ 1962 X 1   "from an abscessed wisdom tooth"  . Tobacco dependence   . Vitamin D deficiency     Past Surgical History:  Procedure Laterality Date  . APPENDECTOMY    . ARTHROTOMY Right 01/11/2016   Procedure: DISTAL INTERPHALANGEAL JOINT ARTHROTOMY RIGHT INDEX FINGER;  Surgeon: Leanora Cover, MD;  Location: [facility];  Service: Orthopedics;  Laterality: Right;  . BREAST LUMPECTOMY WITH RADIOACTIVE SEED LOCALIZATION Right 08/01/2020   Procedure: RIGHT BREAST LUMPECTOMY WITH RADIOACTIVE SEED LOCALIZATION;  Surgeon: [name], MD;  Location: [facility];  Service: General;  Laterality: Right;  LMA  . COLONOSCOPY    . CYST EXCISION Right 01/11/2016   Procedure: EXCISION MUCOID CYST;  Surgeon: Leanora Cover, MD;   Location: [facility];  Service: Orthopedics;  Laterality: Right;  . GANGLION CYST EXCISION Right 02/21/2020   Procedure: REMOVAL GANGLION OF WRIST;  Surgeon: Leanora Cover, MD;  Location: [facility];  Service: Orthopedics;  Laterality: Right;  . LAPAROSCOPIC APPENDECTOMY N/A 10/31/2014   Procedure: APPENDECTOMY LAPAROSCOPIC;  Surgeon: Alphonsa Overall, MD;  Location: [facility];  Service: General;  Laterality: N/A;  . LEFT HEART CATH AND CORONARY ANGIOGRAPHY N/A 09/06/2018   Procedure: LEFT HEART CATH AND CORONARY ANGIOGRAPHY;  Surgeon: Troy Sine, MD;  Location: [facility];  Service: Cardiovascular;  Laterality: N/A;  . POLYPECTOMY    . SALPINGOOPHORECTOMY Right 1999   "w/grapefruit-sized polyp"  . SHOULDER ARTHROSCOPY Right 2003   removal spurs  . SHOULDER ARTHROSCOPY W/ ROTATOR CUFF REPAIR Left 2012  . SINUS EXPLORATION  12/2013   [facility]  . TENDON REPAIR Left 10/2009   torn tendon @ elbow  . VAGINAL HYSTERECTOMY  1978  . VIDEO ASSISTED THORACOSCOPY (VATS)/WEDGE RESECTION Left 09/28/2018   Procedure: LEFT VIDEO ASSISTED THORACOSCOPY (VATS)/Completion Left Upper lobe Lobectomy. Lymph node dissection. Intercostal nerve block.;  Surgeon: Grace Isaac, MD;  Location: [facility];  Service: Thoracic;  Laterality: Left;  Marland Kitchen VIDEO BRONCHOSCOPY N/A 09/28/2018   Procedure: VIDEO BRONCHOSCOPY;  Surgeon: Grace Isaac, MD;  [facility];  Service: Thoracic;  Laterality: N/A;    Current Medications: Outpatient Medications Prior to Visit  Medication Sig Dispense Refill  . albuterol (PROVENTIL) (2.5 MG/3ML) 0.083% nebulizer solution Take 3 mLs (2.5 mg total) by nebulization every 6 (six) hours as needed for wheezing or shortness of breath. 75 mL 12  . albuterol (VENTOLIN HFA) 108 (90 Base) MCG/ACT inhaler USE 2 INHALATIONS 25 MINUTES APART EVERY 4 HOURS IF NEEDED TO RESCUE ASTHMA ATTACK 8.5 g 6  . aspirin EC 81 MG tablet Take 81 mg by mouth daily.    . Calcium  Carbonate Antacid (CALCIUM CARBONATE PO) Take 1,200 mg by mouth daily with breakfast.    . cetirizine (ZYRTEC) 10 MG tablet Take 10 mg by mouth daily.    . Cholecalciferol (VITAMIN D-3) 125 MCG (5000 UT) TABS Take 10,000 Units by mouth daily. Please discuss dosage with medical doctor as is a high dose 30 tablet 0  . diphenhydrAMINE (BENADRYL) 25 MG tablet Take 25 mg by  mouth daily.    Marland Kitchen ibuprofen (ADVIL,MOTRIN) 200 MG tablet Take 600 mg by mouth every 8 (eight) hours as needed for headache or moderate pain.     Marland Kitchen imiquimod (ALDARA) 5 % cream as needed.    . metoprolol succinate (TOPROL-XL) 25 MG 24 hr tablet TAKE 1 TABLET BY MOUTH EVERY DAY 90 tablet 0  . montelukast (SINGULAIR) 10 MG tablet TAKE 1 TABLET DAILY FOR    ALLERGIES (Patient taking differently: Take 10 mg by mouth at bedtime. Take 1 tablet by mouth daily) 90 tablet 3  . omeprazole (PRILOSEC) 40 MG capsule Take 1 capsule 2 x  /day with Bkfst & Supper for Heartburn & Reflux (Patient taking differently: Take 40 mg by mouth daily as needed (Bkfst & Supper for Heartburn & Reflux).) 180 capsule 3  . pseudoephedrine (SUDAFED) 120 MG 12 hr tablet Take  1 tablet  2 x /day (every 12 hours)   for Head and Chest Congestion 60 tablet 0  . rosuvastatin (CRESTOR) 40 MG tablet TAKE ONE TABLET BY MOUTH DAILY 90 tablet 3  . sertraline (ZOLOFT) 100 MG tablet TAKE 1 TABLET BY MOUTH DAILY FOR MOOD (Patient taking differently: Take 100 mg by mouth daily.) 90 tablet 3  . zinc gluconate 50 MG tablet Take 50 mg by mouth daily.    Marland Kitchen ofloxacin (OCUFLOX) 0.3 % ophthalmic solution as needed.     No facility-administered medications prior to visit.     Allergies:   Chantix [varenicline] and Moxifloxacin hcl in nacl   Social History   Socioeconomic History  . Marital status: Married    Spouse name: Not on file  . Number of children: Not on file  . Years of education: Not on file  . Highest education level: Not on file  Occupational History  . Occupation:  Retired  Tobacco Use  . Smoking status: Former Smoker    Packs/day: 1.00    Years: 50.00    Pack years: 50.00    Types: Cigarettes    Quit date: 09/27/2018    Years since quitting: 2.2  . Smokeless tobacco: Never Used  . Tobacco comment: Quit 09/27/2018  Vaping Use  . Vaping Use: Former  . Substances: Nicotine  Substance and Sexual Activity  . Alcohol use: No    Alcohol/week: 0.0 standard drinks  . Drug use: No  . Sexual activity: Not Currently  Other Topics Concern  . Not on file  Social History Narrative  . Not on file   Social Determinants of Health   Financial Resource Strain: Not on file  Food Insecurity: Not on file  Transportation Needs: Not on file  Physical Activity: Not on file  Stress: Not on file  Social Connections: Not on file    Social history is notable in that she is married for 50 years.  She has 2 children ages 76 and 64.  One child was stillborn at 32 months.  She is retired.  She has a BS in business and marketing.  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  She does not routinely exercise.  Family History:  The patient's family history includes Arthritis in her paternal grandmother; Cancer in an other family member; Colon cancer in an other family member; Colon cancer (age of onset: 44) in some other family members; Colon polyps in her father; Diabetes in her mother; Epilepsy in her sister; Hyperlipidemia in her maternal grandfather; Melanoma (age of onset: 29) in her paternal grandmother; Skin cancer (  age of onset: 67) in her mother; Stroke in her maternal grandmother.   ROS General: Negative; No fevers, chills, or night sweats;  HEENT: Negative; No changes in vision or hearing, sinus congestion, difficulty swallowing Pulmonary: COPD, lung nodule Cardiovascular: See HPI GI: Negative; No nausea, vomiting, diarrhea, or abdominal pain GU: Negative; No dysuria, hematuria, or difficulty voiding Musculoskeletal: Negative; no  myalgias, joint pain, or weakness Hematologic/Oncology: Negative; no easy bruising, bleeding Endocrine: Negative; no heat/cold intolerance; no diabetes Neuro: Negative; no changes in balance, headaches Skin: Negative; No rashes or skin lesions Psychiatric: Negative; No behavioral problems, depression Sleep: Negative; No snoring, daytime sleepiness, hypersomnolence, bruxism, restless legs, hypnogognic hallucinations, no cataplexy Other comprehensive 14 point system review is negative.   PHYSICAL EXAM:   VS:  BP 108/80   Pulse 73   Ht '5\' 4"'  (1.626 m)   Wt 186 lb (84.4 kg)   BMI 31.93 kg/m    Repeat blood pressure was 116/78  Wt Readings from Last 3 Encounters:  12/19/20 186 lb (84.4 kg)  10/17/20 188 lb 12.8 oz (85.6 kg)  10/04/20 189 lb 6.4 oz (85.9 kg)     General: Alert, oriented, no distress.  Skin: normal turgor, no rashes, warm and dry HEENT: Normocephalic, atraumatic. Pupils equal round and reactive to light; sclera anicteric; extraocular muscles intact; Nose without nasal septal hypertrophy Mouth/Parynx benign; Mallinpatti scale 3 Neck: No JVD, no carotid bruits; normal carotid upstroke Lungs: clear to ausculatation and percussion; no wheezing or rales Chest wall: without tenderness to palpitation Heart: PMI not displaced, RRR, s1 s2 normal, 1/6 systolic murmur, no diastolic murmur, no rubs, gallops, thrills, or heaves Abdomen: soft, nontender; no hepatosplenomehaly, BS+; abdominal aorta nontender and not dilated by palpation. Back: no CVA tenderness Pulses 2+ Musculoskeletal: full range of motion, normal strength, no joint deformities Extremities: no clubbing cyanosis or edema, Homan's sign negative  Neurologic: grossly nonfocal; Cranial nerves grossly wnl Psychologic: Normal mood and affect   Studies/Labs Reviewed:   EKG:  EKG is ordered today.  ECG (independently read by me): NSR at 73; Q III, T wave  Abnormality V1-2; no ectopy  January 2021 ECG  (independently read by me): Normal sinus rhythm at 72 bpm.  T wave abnormality V1 through V3.  QTc interval 457 ms.  January 24, 2019 ECG (independently read by me):NSR at 40, Nonspecic T wave abnormality V2-3  August 26, 2018 ECG (independently read by me): Normal sinus rhythm at 75 bpm.  Possible left atrial enlargement.  QTc interval 462 ms.  PR interval 176 ms.  July 15, 2018 ECG (independently read by me): Sinus rhythm at 78 bpm.  No ectopy.  Normal intervals.  Early transition.  Recent Labs: BMP Latest Ref Rng & Units 08/07/2020 07/24/2020 03/23/2020  Glucose 65 - 99 mg/dL 96 115(H) 104(H)  BUN 7 - 25 mg/dL '18 12 16  ' Creatinine 0.50 - 0.99 mg/dL 0.95 0.99 0.84  BUN/Creat Ratio 6 - 22 (calc) NOT APPLICABLE - NOT APPLICABLE  Sodium 027 - 146 mmol/L 139 138 138  Potassium 3.5 - 5.3 mmol/L 4.7 4.3 4.3  Chloride 98 - 110 mmol/L 105 104 107  CO2 20 - 32 mmol/L 25 21(L) 24  Calcium 8.6 - 10.4 mg/dL 10.0 9.4 9.2     Hepatic Function Latest Ref Rng & Units 08/07/2020 03/23/2020 12/01/2019  Total Protein 6.1 - 8.1 g/dL 6.9 6.6 6.6  Albumin 3.5 - 5.0 g/dL - - -  AST 10 - 35 U/L 31 32 34  ALT  6 - 29 U/L 28 32(H) 41(H)  Alk Phosphatase 38 - 126 U/L - - -  Total Bilirubin 0.2 - 1.2 mg/dL 0.6 0.5 0.6  Bilirubin, Direct 0.0 - 0.2 mg/dL - - -    CBC Latest Ref Rng & Units 08/07/2020 07/24/2020 03/23/2020  WBC 3.8 - 10.8 Thousand/uL 6.3 6.8 5.1  Hemoglobin 11.7 - 15.5 g/dL 14.2 14.0 13.8  Hematocrit 35.0 - 45.0 % 43.8 43.9 42.1  Platelets 140 - 400 Thousand/uL 211 194 188   Lab Results  Component Value Date   MCV 88.8 08/07/2020   MCV 90.5 07/24/2020   MCV 90.0 03/23/2020   Lab Results  Component Value Date   TSH 1.06 08/07/2020   Lab Results  Component Value Date   HGBA1C 6.0 (H) 07/24/2020     BNP No results found for: BNP  ProBNP No results found for: PROBNP   Lipid Panel     Component Value Date/Time   CHOL 166 08/07/2020 1002   TRIG 114 08/07/2020 1002   HDL 74  08/07/2020 1002   CHOLHDL 2.2 08/07/2020 1002   VLDL 29 11/12/2016 1730   LDLCALC 72 08/07/2020 1002     RADIOLOGY: No results found.   Additional studies/ records that were reviewed today include:  Reviewed the records of Dr. Annamaria Boots, her spirometry, chest CT and laboratory.  ASSESSMENT:    1. Coronary artery disease involving native coronary artery of native heart without angina pectoris   2. Hyperlipidemia LDL goal <70   3. Non-small cell cancer of left lung ([place])   4. Chronic obstructive pulmonary disease, unspecified COPD type ([place])   5. Aortic atherosclerosis ([facility])   6. Class 1 obesity due to excess calories with serious comorbidity and body mass index (BMI) of 33.0 to 33.9 in adult     PLAN:  Karen Barrera is a 70 year old female who had a 47-year history of tobacco use, having started at age 11.  She has a history of surgical rhinitis, recurrent acute bronchitis, mixed COPD and has been on Singulair, pro-air, and Anora Ellipta.  She has had intermittent episodes of nonradiating short-lived chest discomfort, not typically exertionally precipitated.  A chest CT from August 2019 revealed stable left upper lobe nodule but she was noted to have coronary artery calcification suggesting CAD.  When I initially saw her, I titrated her rosuvastatin to 20 mg in light of her LDL of 93. An echo Doppler study demonstrated normal systolic function with grade 1 diastolic dysfunction.  Due to her coronary calcification and significant tobacco history she underwent coronary CTA.  Calcium score was significantly increased at 891 placing her in the 97th percentile for age and gender.  She was found to have probable 70 to 90% severe stenosis in the proximal LAD and on on FFR assessment this was felt to be hemodynamically significant at 0.76.  Subsequent cardiac catheterization  revealed evidence for coronary calcification with mild nonobstructive CAD for which medical therapy was recommended.  She  subsequently was found to have progressive increase in her soft tissue thickening along the posterior lateral left upper lobe bulla and underwent ultimate bronchoscopy with left upper lobectomy and lymph node dissection for non-small cell CA cancer by frozen section.  She was felt to be clear and does not require chemotherapy.  At present, she remains stable from a cardiac standpoint.  Her blood pressure is well controlled on her regimen consisting of metoprolol succinate 25 mg daily.  She is on a baby aspirin for  her CAD.  She continues to be on omeprazole for GERD and sertraline for anxiety.  She does not have any anginal symptoms.  She will need to establish with another thoracic surgeon since Dr. Pia Mau retired the end of March 2022.  He continues to follow with Dr. Keturah Barre for her COPD and lung cancer.  BMI is increased at 31.9, improved, but continued weight loss would be beneficial she has a follow-up appointment to see Dr. Melford Aase this summer.  As long as she remains stable, I will see her in 1 year for reevaluation or sooner as needed    Medication Adjustments/Labs and Tests Ordered: Current medicines are reviewed at length with the patient today.  Concerns regarding medicines are outlined above.  Medication changes, Labs and Tests ordered today are listed in the Patient Instructions below. Patient Instructions  Medication Instructions:  Your physician recommends that you continue on your current medications as directed. Please refer to the Current Medication list given to you today.  *If you need a refill on your cardiac medications before your next appointment, please call your pharmacy*  Follow-Up: At [facility], you and your health needs are our priority.  As part of our continuing mission to provide you with exceptional heart care, we have created designated Provider Care Teams.  These Care Teams include your primary Cardiologist (physician) and Advanced Practice Providers (APPs  -  Physician Assistants and Nurse Practitioners) who all work together to provide you with the care you need, when you need it.  We recommend signing up for the patient portal called "MyChart".  Sign up information is provided on this After Visit Summary.  MyChart is used to connect with patients for Virtual Visits (Telemedicine).  Patients are able to view lab/test results, encounter notes, upcoming appointments, etc.  Non-urgent messages can be sent to your provider as well.   To learn more about what you can do with MyChart, go to [email or web address].    Your next appointment:   12 month(s)  The format for your next appointment:   In Person  Provider:   Shelva Majestic, MD         Signed, Shelva Majestic, MD  12/19/2020 11:14 AM    [facility] [address], [place], [place], [place]  [address] Phone: [phone]

## 2020-12-28 NOTE — Progress Notes (Signed)
ANNUAL MEDICARE VISIT AND 3 MONTH   Assessment:    Annual Medicare Wellness Visit Due annually   CAD/coronary calcium elevated Dr. Claiborne Billings following LDL goal <70, titrate medications Control blood pressure, cholesterol, glucose, increase exercise.  Go to the ER if any chest pain, shortness of breath, nausea, dizziness, syncope - lipid panel  Aortic atherosclerosis (HCC) Control blood pressure, cholesterol, glucose, increase exercise.   COPD mixed type ([place]) No triggers, well controlled symptoms, cont to monitor  Malignant neoplasm of upper lobe of left lung ([place]) S/p lobectomy, continue follow up with surgeon  Recurrent major depressive disorder, in full remission ([place]) Continue medication Lifestyle discussed: diet/exerise, sleep hygiene, stress management, hydration -     TSH  Mixed hyperlipidemia -     Lipid panel check lipids decrease fatty foods increase activity.   Medication management -     CBC with Differential/Platelet -     COMPLETE METABOLIC PANEL WITH GFR -     Magnesium  Obesity  - follow up 3 months for progress monitoring - increase veggies, decrease carbs - long discussion about weight loss, diet, and exercise  Fatty liver -     COMPLETE METABOLIC PANEL WITH GFR Weight loss advised, will monitor LFTs  Agatston coronary artery calcium score greater than 400 Control blood pressure, cholesterol, glucose, increase exercise.  Following with Dr. Claiborne Billings  Hot flashes Continue meds, off of estrogen, GYN managing  Hx of adenomatous colonic polyps UTD; increase fiber  Abnormal glucose Discussed disease progression and risks Discussed diet/exercise, weight management and risk modification -     Hemoglobin A1c  B12 deficiency -     Vitamin B12  Vitamin D deficiency Continue supplement  Osteopenia - get dexa q2y, continue Vit D and Ca supplement if needed, weight bearing exercises  Former smoker (50 pack year, quit 2020) Surgeon following with  CTs, recent in 09/2020  Memory changes Short term memory intact,  Reduce stress, get on B12 supplement, check TSH Antiinflammatory diet and increase exercise Follow up if progressive/not improving  Allergic rhinitis/sinus pressure Discussed the importance of avoiding unnecessary antibiotic therapy. Suggested symptomatic OTC remedies. Nasal saline spray for congestion. Nasal steroids 1-2 sprays each nostril BID, allergy pill, oral steroids offered.  Tessalon sent in for cough, can do delsym or robitussin at destination as well if needed.  Follow up as needed.   Orders Placed This Encounter  Procedures  . CBC with Differential/Platelet  . COMPLETE METABOLIC PANEL WITH GFR  . Magnesium  . Lipid panel  . TSH  . Hemoglobin A1c  . VITAMIN D 25 Hydroxy (Vit-D Deficiency, Fractures)     Over 40 minutes of exam, counseling, chart review and critical decision making was performed Future Appointments  Date Time Provider [place]  03/04/2021  9:30 AM [name], MD [facility] None  08/07/2021  9:00 AM Liane Comber, NP [facility] None  10/17/2021  3:00 PM Deneise Lever, MD [facility] None  12/31/2021  3:30 PM Liane Comber, NP [facility] None    Subjective:  Karen Barrera is a 70 y.o. female who presents for AWV. She has Seasonal and perennial allergic rhinitis; Depression; Hyperlipidemia; Other abnormal glucose (prediabetes); Vitamin D deficiency; Former smoker; Medication management; Hx of adenomatous colonic polyps; Costochondritis; COPD mixed type ([place]); Hot flashes; Obesity (BMI 30.0-34.9); Genetic testing; Aortic atherosclerosis ([place]); Fatty liver; Agatston coronary artery calcium score greater than 400; Lung cancer ([place]); Hormone replacement therapy; Arthritis; Gastroesophageal reflux disease; Osteopenia; Primary osteoarthritis of first carpometacarpal joint of left  hand; Family history of cancer; and VIN III (vulvar intraepithelial neoplasia III) on their  problem list.   She has allergies, has been taking zyrtec AM, benadryl PM; she reports 2 weeks of worsening congestion and sinus fullness; has tried mucinex, taking sudafed BID PRN, has tried some nasal sprays but unsure what and hasn't done consistently. Denies HA, fever/chills, sinus tenderness. She is concerned due to leaving on trip to [place] tomorrow. Dry cough with post-nasal drip.   Remote TAH. She is recently following with GYN Dr[facility]ston [facility] and Dr. Denman George for VIN III, planning q57m follow up. Still has hot flashes, but off of estrogen due to cancer.   Recently on 08/01/2020 had R lumpectomy by Dr. Ninfa Linden for 0.6 x 0.7 x 0.3 lesion was seen at the 1 o'clock position of the right breast near the nipple areolar complex seen on recent mammogram. A biopsy of this was performed showing a complex sclerosing lesion, biopsy returned benign. Can resume routine annual mammograms.   She has COPD, s/p left lobectomy for stage 2 non small cell carcinoma on 09/28/2018 with Dr. Servando Snare. She has not smoked since March 3rd 2020. Occasional dry cough but has improved, she uses albuterol to take as needed, didn't see benefit with anoro. Had CT 10/04/2020 showing no evidence of recurrent or metastatic disease.   Recurrent lung imaging has showed hepatic steatosis and R lobe lesion, originally noted on CT 10/2014 and characterized as cyst at that time, approx 3 cm, overall fairly stable on serial imaging since that time, felt likely cyst without concerning features. MRI has been suggested if needing to characterize further.  She is on prilosec 40 mg, recently takes PRN, hasn't needed in some time.  BMI is Body mass index is 31.76 kg/m., she has been working on diet and exercise, trying to lose weight, down from 199 lb, doing fruits and veggies. Very active running after her great grandson, 19 months.  Admits to drinking too much diet Dr. Malachi Bonds.  Wt Readings from Last 3 Encounters:  12/31/20  185 lb (83.9 kg)  12/19/20 186 lb (84.4 kg)  10/17/20 188 lb 12.8 oz (85.6 kg)   She follows with Dr. Claiborne Billings for [place]; Ct chest 2018 showed aortic atherosclerosis; cath 08/2018 revealed evidence for coronary calcification with mild nonobstructive CAD for which medical therapy was recommended.  Her blood pressure has been controlled at home, today their BP is BP: 136/70 She does not workout, she is cleaning, being active inside and watches great grandson but not doing any working out. .She denies chest pain, shortness of breath, dizziness.   She is on cholesterol medication and denies myalgias. Her cholesterol is not at goal. The cholesterol last visit was:   Lab Results  Component Value Date   CHOL 166 08/07/2020   HDL 74 08/07/2020   LDLCALC 72 08/07/2020   TRIG 114 08/07/2020   CHOLHDL 2.2 08/07/2020   She has prediabetes, has been working on diet/exericse. Denies diabetic polys. Last A1C:  Lab Results  Component Value Date   HGBA1C 6.0 (H) 07/24/2020   Last GFR: Lab Results  Component Value Date   GFRNONAA 61 08/07/2020   Patient is on Vitamin D supplement, taking 10000 IU  Lab Results  Component Value Date   VD25OH 66 03/23/2020     She has not started on a b12 supplement;  Lab Results  Component Value Date   VITAMINB12 431 08/07/2020    Medication Review:  Current Outpatient Medications (Endocrine &  Metabolic):  .  predniSONE (DELTASONE) 20 MG tablet, 2 tablets daily for 3 days, 1 tablet daily for 4 days.  Current Outpatient Medications (Cardiovascular):  .  metoprolol succinate (TOPROL-XL) 25 MG 24 hr tablet, TAKE 1 TABLET BY MOUTH EVERY DAY .  rosuvastatin (CRESTOR) 40 MG tablet, TAKE ONE TABLET BY MOUTH DAILY  Current Outpatient Medications (Respiratory):  .  albuterol (PROVENTIL) (2.5 MG/3ML) 0.083% nebulizer solution, Take 3 mLs (2.5 mg total) by nebulization every 6 (six) hours as needed for wheezing or shortness of breath. Marland Kitchen  albuterol (VENTOLIN HFA) 108  (90 Base) MCG/ACT inhaler, USE 2 INHALATIONS 25 MINUTES APART EVERY 4 HOURS IF NEEDED TO RESCUE ASTHMA ATTACK .  cetirizine (ZYRTEC) 10 MG tablet, Take 10 mg by mouth daily. .  diphenhydrAMINE (BENADRYL) 25 MG tablet, Take 25 mg by mouth daily. .  montelukast (SINGULAIR) 10 MG tablet, TAKE 1 TABLET DAILY FOR    ALLERGIES (Patient taking differently: Take 10 mg by mouth at bedtime. Take 1 tablet by mouth daily) .  pseudoephedrine (SUDAFED) 120 MG 12 hr tablet, Take  1 tablet  2 x /day (every 12 hours)   for Head and Chest Congestion  Current Outpatient Medications (Analgesics):  .  aspirin EC 81 MG tablet, Take 81 mg by mouth daily. Marland Kitchen  ibuprofen (ADVIL,MOTRIN) 200 MG tablet, Take 600 mg by mouth every 8 (eight) hours as needed for headache or moderate pain.    Current Outpatient Medications (Other):  Marland Kitchen  Calcium Carbonate Antacid (CALCIUM CARBONATE PO)*, Take 1,200 mg by mouth daily with breakfast. .  Cholecalciferol (VITAMIN D-3) 125 MCG (5000 UT) TABS, Take 10,000 Units by mouth daily. Please discuss dosage with medical doctor as is a high dose .  imiquimod (ALDARA) 5 % cream, as needed. Marland Kitchen  ofloxacin (OCUFLOX) 0.3 % ophthalmic solution, as needed. Marland Kitchen  omeprazole (PRILOSEC) 40 MG capsule, Take 1 capsule 2 x  /day with Bkfst & Supper for Heartburn & Reflux (Patient taking differently: Take 40 mg by mouth daily as needed (Bkfst & Supper for Heartburn & Reflux).) .  sertraline (ZOLOFT) 100 MG tablet, TAKE 1 TABLET BY MOUTH DAILY FOR MOOD (Patient taking differently: Take 100 mg by mouth daily.) .  zinc gluconate 50 MG tablet, Take 50 mg by mouth daily. * These medications belong to multiple therapeutic classes and are listed under each applicable group.   Allergies  Allergen Reactions  . Chantix [Varenicline] Anaphylaxis and Swelling    Makes throat swell up  . Moxifloxacin Hcl In Nacl Other (See Comments)    aches    Current Problems (verified) Patient Active Problem List   Diagnosis Date  Noted  . VIN III (vulvar intraepithelial neoplasia III) 05/11/2020  . Primary osteoarthritis of first carpometacarpal joint of left hand 04/27/2019  . Hormone replacement therapy 04/06/2019  . Arthritis 04/06/2019  . Gastroesophageal reflux disease 04/06/2019  . Osteopenia 04/06/2019  . Family history of cancer 04/06/2019  . Lung cancer ([place]) 09/28/2018  . Agatston coronary artery calcium score greater than 400   . Fatty liver 08/29/2018  . Aortic atherosclerosis ([place]) 08/06/2017  . Genetic testing 04/24/2017  . Hot flashes 11/12/2016  . Obesity (BMI 30.0-34.9) 11/12/2016  . COPD mixed type ([place]) 06/05/2016  . Costochondritis 03/23/2015  . Hx of adenomatous colonic polyps 01/23/2015  . Medication management 11/21/2014  . Depression   . Hyperlipidemia   . Other abnormal glucose (prediabetes)   . Vitamin D deficiency   . Former smoker   .  Seasonal and perennial allergic rhinitis 07/03/2013    Screening Tests Immunization History  Administered Date(s) Administered  . Influenza Split 05/16/2013, 05/12/2014, 05/14/2015  . Influenza, High Dose Seasonal PF 05/15/2017, 05/07/2018, 03/29/2019  . Influenza, Seasonal, Injecte, Preservative Fre 05/13/2016  . Influenza-Unspecified 07/23/2020  . PFIZER(Purple Top)SARS-COV-2 Vaccination 10/01/2019, 10/22/2019, 05/17/2020  . Pneumococcal Conjugate-13 11/12/2016  . Pneumococcal Polysaccharide-23 06/14/2011  . Tdap 03/27/2010  . Unspecified SARS-COV-2 Vaccination 10/01/2019, 10/22/2019  . Zoster, Live 04/14/2012    Tetanus: 2011 due, will get PRN Pneumovax:  2012 Prevnar 13: 2018 Flu vaccine: 2021 Zostavax: 2013 COVID: 2/2, 2021 + booster Shingrix 2021 has gotten both at pharmacy -  need documentation  MGM: 03/2020 diagnostic, getting annual screening via GYN DEXA: 2019  At GYN Osteopenia will get at GYN PAP getting by GYN, vulvar lesion is monitored annually Colonoscopy: 05/2020 due in 3 years, 5 polyps, Dr. Carlean Purl  PFT  08/2018 CT chest 03/2019 Cath 08/2018  Names of Other Physician/Practitioners you currently use: 1. [facility] here for primary care 2. Dr. Dorena Cookey, eye doctor, last visit 2022 3. Dr. Eliezer Bottom, dentist 2022 4. Dr. Marland Kitchen derm, last visit 04/2020 for total body check   Patient Care Team: [name], MD as PCP - General (Internal Medicine) Troy Sine, MD as PCP - Cardiology (Cardiology) Izora Gala, MD as Consulting Physician (Otolaryngology) Delila Pereyra, MD as Consulting Physician (Gynecology) Gatha Mayer, MD as Consulting Physician (Gastroenterology) Maple Hudson, MD as Referring Physician (Surgery)  SURGICAL HISTORY She  has a past surgical history that includes Shoulder arthroscopy w/ rotator cuff repair (Left, 2012); Tendon repair (Left, 10/2009); Shoulder arthroscopy (Right, 2003); Salpingoophorectomy (Right, 1999); Sinus exploration (12/2013); Vaginal hysterectomy (1978); laparoscopic appendectomy (N/A, 10/31/2014); Appendectomy; Colonoscopy; Cyst excision (Right, 01/11/2016); Arthrotomy (Right, 01/11/2016); LEFT HEART CATH AND CORONARY ANGIOGRAPHY (N/A, 09/06/2018); Video bronchoscopy (N/A, 09/28/2018); Video assisted thoracoscopy (vats)/wedge resection (Left, 09/28/2018); Ganglion cyst excision (Right, 02/21/2020); Polypectomy; and Breast lumpectomy with radioactive seed localization (Right, 08/01/2020). FAMILY HISTORY Her family history includes Arthritis in her paternal grandmother; Cancer in an other family member; Colon cancer in an other family member; Colon cancer (age of onset: 75) in some other family members; Colon polyps in her father; Diabetes in her mother; Epilepsy in her sister; Hyperlipidemia in her maternal grandfather; Melanoma (age of onset: 12) in her paternal grandmother; Skin cancer (age of onset: 52) in her mother; Stroke in her maternal grandmother. SOCIAL HISTORY She  reports that she quit smoking about 2 years ago.  Her smoking use included cigarettes. She has a 50.00 pack-year smoking history. She has never used smokeless tobacco. She reports that she does not drink alcohol and does not use drugs.  MEDICARE WELLNESS OBJECTIVES: Physical activity: Current Exercise Habits: The patient does not participate in regular exercise at present, Exercise limited by: None identified Cardiac risk factors: Cardiac Risk Factors include: advanced age (>85men, >74 women);dyslipidemia;hypertension;obesity (BMI >30kg/m2);sedentary lifestyle;smoking/ tobacco exposure Depression/mood screen:   Depression screen [facility] 2/9 12/31/2020  Decreased Interest 0  Down, Depressed, Hopeless 0  PHQ - 2 Score 0  Altered sleeping -  Tired, decreased energy -  Change in appetite -  Feeling bad or failure about yourself  -  Trouble concentrating -  Moving slowly or fidgety/restless -  Suicidal thoughts -  PHQ-9 Score -  Difficult doing work/chores -  Some recent data might be hidden    ADLs:  In your present state of health, do you have any difficulty performing  the following activities: 12/31/2020 07/24/2020  Hearing? N N  Vision? N N  Difficulty concentrating or making decisions? Tempie Donning  Walking or climbing stairs? N N  Dressing or bathing? N N  Doing errands, shopping? N N  Some recent data might be hidden     Cognitive Testing  Alert? Yes  Normal Appearance?Yes  Oriented to person? Yes  Place? Yes   Time? Yes  Recall of three objects?  Yes  Can perform simple calculations? Yes  Displays appropriate judgment?Yes  Can read the correct time from a watch face?Yes  EOL planning: Does Patient Have a Medical Advance Directive?: No Would patient like information on creating a medical advance directive?: Yes (MAU/Ambulatory/Procedural Areas - Information given)      Review of Systems  Constitutional: Negative.  Negative for malaise/fatigue and weight loss.  HENT: Positive for congestion. Negative for ear pain, hearing loss,  sinus pain and tinnitus.        Chronically decreased smell and taste, has seen ENT  Eyes: Negative.  Negative for blurred vision and double vision.  Respiratory: Negative for cough, hemoptysis, sputum production, shortness of breath and wheezing.   Cardiovascular: Negative.  Negative for chest pain, palpitations, orthopnea, claudication and leg swelling.  Gastrointestinal: Negative for abdominal pain, blood in stool, constipation, diarrhea, heartburn, melena, nausea and vomiting.  Genitourinary: Negative.   Musculoskeletal: Negative for back pain, falls, joint pain, myalgias and neck pain.  Skin: Negative.  Negative for rash.  Neurological: Negative.  Negative for dizziness, tingling, sensory change, weakness and headaches.  Endo/Heme/Allergies: Positive for environmental allergies. Negative for polydipsia.  Psychiatric/Behavioral: Positive for memory loss (more difficulty with navigation, good short term recall ). Negative for depression, hallucinations, substance abuse and suicidal ideas. The patient is not nervous/anxious and does not have insomnia.   All other systems reviewed and are negative.    Objective:     Today's Vitals   12/31/20 1518  BP: 136/70  Pulse: 64  Temp: (!) 97.3 F (36.3 C)  SpO2: 97%  Weight: 185 lb (83.9 kg)  Height: 5\' 4"  (1.626 m)   Body mass index is 31.76 kg/m.  General appearance: alert, no distress, WD/WN, female HEENT: normocephalic, sclerae anicteric, TMs pearly, nares patent, no discharge or erythema, pharynx normal, no sinus tenderness Oral cavity: MMM, no lesions Neck: supple, no lymphadenopathy, no thyromegaly, no masses Heart: RRR, normal S1, S2, no murmur  Lungs: CTA bilaterally, no wheezes, rhonchi, or rales Abdomen: +bs, soft, non-tender, non distended, no masses, no hepatomegaly, no splenomegaly Musculoskeletal: nontender, no swelling, no obvious deformity Extremities: no edema, no cyanosis, no clubbing Pulses: 2+ symmetric, upper and  lower extremities, normal cap refill Neurological: alert, oriented x 3, CN2-12 intact, strength normal upper extremities and lower extremities, sensation normal throughout, DTRs 2+ throughout, no cerebellar signs, gait normal Psychiatric: normal affect, behavior normal, pleasant    Medicare Attestation I have personally reviewed: The patient's medical and social history Their use of alcohol, tobacco or illicit drugs Their current medications and supplements The patient's functional ability including ADLs,fall risks, home safety risks, cognitive, and hearing and visual impairment Diet and physical activities Evidence for depression or mood disorders  The patient's weight, height, BMI, and visual acuity have been recorded in the chart.  I have made referrals, counseling, and provided education to the patient based on review of the above and I have provided the patient with a written personalized care plan for preventive services.     Izora Ribas, NP  12/31/2020     

## 2020-12-31 ENCOUNTER — Encounter: Payer: Self-pay | Admitting: Adult Health

## 2020-12-31 ENCOUNTER — Other Ambulatory Visit: Payer: Self-pay

## 2020-12-31 ENCOUNTER — Ambulatory Visit (INDEPENDENT_AMBULATORY_CARE_PROVIDER_SITE_OTHER): Payer: Medicare Other | Admitting: Adult Health

## 2020-12-31 VITALS — BP 136/70 | HR 64 | Temp 97.3°F | Ht 64.0 in | Wt 185.0 lb

## 2020-12-31 DIAGNOSIS — E669 Obesity, unspecified: Secondary | ICD-10-CM

## 2020-12-31 DIAGNOSIS — M8580 Other specified disorders of bone density and structure, unspecified site: Secondary | ICD-10-CM | POA: Diagnosis not present

## 2020-12-31 DIAGNOSIS — Z86010 Personal history of colonic polyps: Secondary | ICD-10-CM

## 2020-12-31 DIAGNOSIS — K760 Fatty (change of) liver, not elsewhere classified: Secondary | ICD-10-CM

## 2020-12-31 DIAGNOSIS — F3342 Major depressive disorder, recurrent, in full remission: Secondary | ICD-10-CM

## 2020-12-31 DIAGNOSIS — M1990 Unspecified osteoarthritis, unspecified site: Secondary | ICD-10-CM

## 2020-12-31 DIAGNOSIS — C3490 Malignant neoplasm of unspecified part of unspecified bronchus or lung: Secondary | ICD-10-CM | POA: Diagnosis not present

## 2020-12-31 DIAGNOSIS — E66811 Obesity, class 1: Secondary | ICD-10-CM

## 2020-12-31 DIAGNOSIS — J449 Chronic obstructive pulmonary disease, unspecified: Secondary | ICD-10-CM

## 2020-12-31 DIAGNOSIS — E559 Vitamin D deficiency, unspecified: Secondary | ICD-10-CM

## 2020-12-31 DIAGNOSIS — K219 Gastro-esophageal reflux disease without esophagitis: Secondary | ICD-10-CM | POA: Diagnosis not present

## 2020-12-31 DIAGNOSIS — J3089 Other allergic rhinitis: Secondary | ICD-10-CM

## 2020-12-31 DIAGNOSIS — Z0001 Encounter for general adult medical examination with abnormal findings: Secondary | ICD-10-CM

## 2020-12-31 DIAGNOSIS — I700 Atherosclerosis of aorta: Secondary | ICD-10-CM | POA: Diagnosis not present

## 2020-12-31 DIAGNOSIS — R413 Other amnesia: Secondary | ICD-10-CM

## 2020-12-31 DIAGNOSIS — Z0000 Encounter for general adult medical examination without abnormal findings: Secondary | ICD-10-CM

## 2020-12-31 DIAGNOSIS — Z79899 Other long term (current) drug therapy: Secondary | ICD-10-CM | POA: Diagnosis not present

## 2020-12-31 DIAGNOSIS — Z860101 Personal history of adenomatous and serrated colon polyps: Secondary | ICD-10-CM

## 2020-12-31 DIAGNOSIS — R6889 Other general symptoms and signs: Secondary | ICD-10-CM | POA: Diagnosis not present

## 2020-12-31 DIAGNOSIS — R931 Abnormal findings on diagnostic imaging of heart and coronary circulation: Secondary | ICD-10-CM

## 2020-12-31 DIAGNOSIS — E782 Mixed hyperlipidemia: Secondary | ICD-10-CM

## 2020-12-31 DIAGNOSIS — R7309 Other abnormal glucose: Secondary | ICD-10-CM

## 2020-12-31 DIAGNOSIS — K769 Liver disease, unspecified: Secondary | ICD-10-CM

## 2020-12-31 DIAGNOSIS — J302 Other seasonal allergic rhinitis: Secondary | ICD-10-CM

## 2020-12-31 DIAGNOSIS — D071 Carcinoma in situ of vulva: Secondary | ICD-10-CM

## 2020-12-31 MED ORDER — BENZONATATE 100 MG PO CAPS: 200.0000 mg | ORAL_CAPSULE | Freq: Three times a day (TID) | ORAL | 0 refills | Status: DC | PRN

## 2020-12-31 MED ORDER — PREDNISONE 20 MG PO TABS: ORAL_TABLET | ORAL | 0 refills | Status: DC

## 2020-12-31 NOTE — Patient Instructions (Addendum)
Check with pharmacy what vaccine you got  Was it the shingrix - if so, please send documents   Add sublingual B12 daily that dissolves in the mouth -   Can cut back or stop zinc  Can reduce calcium to max 300 mg twice daily - avoid taking large doses all at once  Add daily brisk walking, 20-30 min daily    Antiinflammatory diet  Doctors are learning that one of the best ways to reduce inflammation lies not in the medicine cabinet, but in the refrigerator.  By following an anti-inflammatory diet you can fight off inflammation for good.  What does an anti-inflammatory diet do? Your immune system becomes activated when your body recognizes anything that is foreign--such as an invading microbe, plant pollen, or chemical. This often triggers a process called inflammation. Intermittent bouts of inflammation directed at truly threatening invaders protect your health.  However, sometimes inflammation persists, day in and day out, even when you are not threatened by a foreign invader. That's when inflammation can become your enemy. Many major diseases that plague us--including cancer, heart disease, diabetes, arthritis, depression, and Alzheimer's--have been linked to chronic inflammation.  One of the most powerful tools to combat inflammation comes not from the pharmacy, but from the grocery store.   Protect yourself from the damage of chronic inflammation. Science has proven that chronic, low-grade inflammation can turn into a silent killer that contributes to cardiovascular disease, cancer, type 2 diabetes and other conditions.   Choose the right anti-inflammatory foods, and you may be able to reduce your risk of illness. Consistently pick the wrong ones, and you could accelerate the inflammatory disease process.     Foods that cause inflammation Try to avoid or limit these foods as much as possible:   refined carbohydrates, such as white bread and pastries  [place] fries and other  fried foods  soda and other sugar-sweetened beverages  red meat (burgers, steaks) and processed meat (hot dogs, sausage)  margarine, shortening, and lard  The health risks of inflammatory foods Not surprisingly, the same foods on an inflammation diet are generally considered bad for our health, including sodas and refined carbohydrates, as well as red meat and processed meats.  "Some of the foods that have been associated with an increased risk for chronic diseases such as type 2 diabetes and heart disease are also associated with excess inflammation," Dr. Melanee Spry says. "It's not surprising, since inflammation is an important underlying mechanism for the development of these diseases."  Unhealthy foods also contribute to weight gain, which is itself a risk factor for inflammation. Yet in several studies, even after researchers took obesity into account, the link between foods and inflammation remained, which suggests weight gain isn't the sole driver. "Some of the food components or ingredients may have independent effects on inflammation over and above increased caloric intake," Dr. Melanee Spry says.  Anti-inflammatory foods An anti-inflammatory diet should include these foods:   tomatoes  olive oil  green leafy vegetables, such as spinach, kale, and collards  nuts like almonds and walnuts  fatty fish like salmon, mackerel, tuna, and sardines  fruits such as strawberries, blueberries, cherries, and oranges  Benefits of anti-inflammatory foods On the flip side are beverages and foods that reduce inflammation, in particular fruits and vegetables such as blueberries, apples, and leafy greens that are high in natural antioxidants and polyphenols--protective compounds found in plants.  Studies have also associated nuts with reduced markers of inflammation and a lower risk of cardiovascular disease  and diabetes. Coffee, which contains polyphenols and other anti-inflammatory compounds, may protect  against inflammation, as well.  Anti-inflammatory diet To reduce levels of inflammation, aim for an overall healthy diet. If you're looking for an eating plan that closely follows the tenets of anti-inflammatory eating, consider the Mediterranean diet, which is high in fruits, vegetables, nuts, whole grains, fish, and healthy oils.  In addition to lowering inflammation, a more natural, less processed diet can have noticeable effects on your physical and emotional health and overall quality of life.   [email or web address]

## 2021-01-01 LAB — LIPID PANEL
Cholesterol: 161 mg/dL (ref <200)
HDL: 60 mg/dL (ref >=50)
LDL Cholesterol (Calc): 82 mg/dL (calc)
Non-HDL Cholesterol (Calc): 101 mg/dL (calc) (ref <130)
Total CHOL/HDL Ratio: 2.7 (calc) (ref <5.0)
Triglycerides: 99 mg/dL (ref <150)

## 2021-01-01 LAB — COMPLETE METABOLIC PANEL WITH GFR
AG Ratio: 1.8 (calc) (ref 1.0–2.5)
ALT: 54 U/L — ABNORMAL HIGH (ref 6–29)
AST: 65 U/L — ABNORMAL HIGH (ref 10–35)
Albumin: 4.6 g/dL (ref 3.6–5.1)
Alkaline phosphatase (APISO): 63 U/L (ref 37–153)
BUN: 18 mg/dL (ref 7–25)
CO2: 28 mmol/L (ref 20–32)
Calcium: 9.8 mg/dL (ref 8.6–10.4)
Chloride: 105 mmol/L (ref 98–110)
Creat: 0.9 mg/dL (ref 0.50–0.99)
GFR, Est African American: 76 mL/min/{1.73_m2} (ref >=60)
GFR, Est Non African American: 65 mL/min/{1.73_m2} (ref >=60)
Globulin: 2.5 g/dL (calc) (ref 1.9–3.7)
Glucose, Bld: 86 mg/dL (ref 65–99)
Potassium: 4 mmol/L (ref 3.5–5.3)
Sodium: 140 mmol/L (ref 135–146)
Total Bilirubin: 0.5 mg/dL (ref 0.2–1.2)
Total Protein: 7.1 g/dL (ref 6.1–8.1)

## 2021-01-01 LAB — CBC WITH DIFFERENTIAL/PLATELET
Absolute Monocytes: 518 cells/uL (ref 200–950)
Basophils Absolute: 70 cells/uL (ref 0–200)
Basophils Relative: 1.1 %
Eosinophils Absolute: 122 cells/uL (ref 15–500)
Eosinophils Relative: 1.9 %
HCT: 41.9 % (ref 35.0–45.0)
Hemoglobin: 13.6 g/dL (ref 11.7–15.5)
Lymphs Abs: 1811 cells/uL (ref 850–3900)
MCH: 28.8 pg (ref 27.0–33.0)
MCHC: 32.5 g/dL (ref 32.0–36.0)
MCV: 88.8 fL (ref 80.0–100.0)
MPV: 10.4 fL (ref 7.5–12.5)
Monocytes Relative: 8.1 %
Neutro Abs: 3878 cells/uL (ref 1500–7800)
Neutrophils Relative %: 60.6 %
Platelets: 195 10*3/uL (ref 140–400)
RBC: 4.72 10*6/uL (ref 3.80–5.10)
RDW: 13.1 % (ref 11.0–15.0)
Total Lymphocyte: 28.3 %
WBC: 6.4 10*3/uL (ref 3.8–10.8)

## 2021-01-01 LAB — VITAMIN D 25 HYDROXY (VIT D DEFICIENCY, FRACTURES): Vit D, 25-Hydroxy: 70 ng/mL (ref 30–100)

## 2021-01-01 LAB — HEMOGLOBIN A1C
Hgb A1c MFr Bld: 6.2 % of total Hgb — ABNORMAL HIGH (ref <5.7)
Mean Plasma Glucose: 131 mg/dL
eAG (mmol/L): 7.3 mmol/L

## 2021-01-01 LAB — MAGNESIUM: Magnesium: 2.3 mg/dL (ref 1.5–2.5)

## 2021-01-01 LAB — TSH: TSH: 1.8 mIU/L (ref 0.40–4.50)

## 2021-01-10 ENCOUNTER — Other Ambulatory Visit: Payer: Self-pay | Admitting: Cardiovascular Disease

## 2021-01-10 ENCOUNTER — Other Ambulatory Visit: Payer: Self-pay | Admitting: Adult Health

## 2021-01-10 DIAGNOSIS — F3342 Major depressive disorder, recurrent, in full remission: Secondary | ICD-10-CM

## 2021-02-14 ENCOUNTER — Encounter: Payer: Self-pay | Admitting: Internal Medicine

## 2021-02-28 ENCOUNTER — Other Ambulatory Visit: Payer: Self-pay

## 2021-02-28 ENCOUNTER — Encounter: Payer: Self-pay | Admitting: Nurse Practitioner

## 2021-02-28 ENCOUNTER — Ambulatory Visit: Payer: Medicare Other | Admitting: Nurse Practitioner

## 2021-02-28 DIAGNOSIS — I2510 Atherosclerotic heart disease of native coronary artery without angina pectoris: Secondary | ICD-10-CM | POA: Diagnosis not present

## 2021-02-28 DIAGNOSIS — U071 COVID-19: Secondary | ICD-10-CM | POA: Diagnosis not present

## 2021-02-28 MED ORDER — BENZONATATE 100 MG PO CAPS: 200.0000 mg | ORAL_CAPSULE | Freq: Three times a day (TID) | ORAL | 0 refills | Status: DC | PRN

## 2021-02-28 MED ORDER — PROMETHAZINE-DM 6.25-15 MG/5ML PO SYRP: 5.0000 mL | ORAL_SOLUTION | Freq: Four times a day (QID) | ORAL | 1 refills | Status: DC | PRN

## 2021-02-28 MED ORDER — AZITHROMYCIN 250 MG PO TABS: ORAL_TABLET | ORAL | 1 refills | Status: DC

## 2021-02-28 MED ORDER — DEXAMETHASONE 1 MG PO TABS
ORAL_TABLET | ORAL | 0 refills | Status: DC
Start: 2021-02-28 — End: 2021-04-07

## 2021-02-28 NOTE — Progress Notes (Signed)
THIS ENCOUNTER IS A VIRTUAL VISIT DUE TO COVID-19 - PATIENT WAS NOT SEEN IN THE OFFICE.  PATIENT HAS CONSENTED TO VIRTUAL VISIT / TELEMEDICINE VISIT   Virtual Visit via telephone Note  I connected with  Karen Barrera on 02/28/2021 by telephone.  I verified that I am speaking with the correct person using two identifiers.    I discussed the limitations of evaluation and management by telemedicine and the availability of in person appointments. The patient expressed understanding and agreed to proceed.  History of Present Illness:  There were no vitals taken for this visit. 70 y.o. patient contacted office reporting URI sx . she tested positive by home test yesterday. OV was conducted by telephone to minimize exposure. This patient was vaccinated for covid 19, [place] last 10/01/19, 10/22/19, 05/17/20  Sx began 3  days ago with sinus congestion, nasal drainage, productive cough of clear mucus, headache and muscle aches.  Denies fever, sore throat.  Treatments tried so far: Vitamin D, Vitamin C and Zinc.   Exposures: Husband   Medications  Current Outpatient Medications (Endocrine & Metabolic):    predniSONE (DELTASONE) 20 MG tablet, 2 tablets daily for 3 days, 1 tablet daily for 4 days.  Current Outpatient Medications (Cardiovascular):    metoprolol succinate (TOPROL-XL) 25 MG 24 hr tablet, TAKE 1 TABLET BY MOUTH EVERY DAY   rosuvastatin (CRESTOR) 40 MG tablet, TAKE ONE TABLET BY MOUTH DAILY  Current Outpatient Medications (Respiratory):    albuterol (PROVENTIL) (2.5 MG/3ML) 0.083% nebulizer solution, Take 3 mLs (2.5 mg total) by nebulization every 6 (six) hours as needed for wheezing or shortness of breath.   albuterol (VENTOLIN HFA) 108 (90 Base) MCG/ACT inhaler, USE 2 INHALATIONS 25 MINUTES APART EVERY 4 HOURS IF NEEDED TO RESCUE ASTHMA ATTACK   benzonatate (TESSALON PERLES) 100 MG capsule, Take 2 capsules (200 mg total) by mouth 3 (three) times daily as needed for cough (Max:  600mg  per day).   cetirizine (ZYRTEC) 10 MG tablet, Take 10 mg by mouth daily.   diphenhydrAMINE (BENADRYL) 25 MG tablet, Take 25 mg by mouth daily.   montelukast (SINGULAIR) 10 MG tablet, TAKE 1 TABLET DAILY FOR    ALLERGIES (Patient taking differently: Take 10 mg by mouth at bedtime. Take 1 tablet by mouth daily)   pseudoephedrine (SUDAFED) 120 MG 12 hr tablet, Take  1 tablet  2 x /day (every 12 hours)   for Head and Chest Congestion  Current Outpatient Medications (Analgesics):    aspirin EC 81 MG tablet, Take 81 mg by mouth daily.   ibuprofen (ADVIL,MOTRIN) 200 MG tablet, Take 600 mg by mouth every 8 (eight) hours as needed for headache or moderate pain.    Current Outpatient Medications (Other):    Calcium Carbonate Antacid (CALCIUM CARBONATE PO)*, Take 1,200 mg by mouth daily with breakfast.   Cholecalciferol (VITAMIN D-3) 125 MCG (5000 UT) TABS, Take 10,000 Units by mouth daily. Please discuss dosage with medical doctor as is a high dose   imiquimod (ALDARA) 5 % cream, as needed.   ofloxacin (OCUFLOX) 0.3 % ophthalmic solution, as needed.   omeprazole (PRILOSEC) 40 MG capsule, Take 1 capsule 2 x  /day with Bkfst & Supper for Heartburn & Reflux (Patient taking differently: Take 40 mg by mouth daily as needed (Bkfst & Supper for Heartburn & Reflux).)   sertraline (ZOLOFT) 100 MG tablet, TAKE 1 TABLET BY MOUTH DAILY FOR MOOD   zinc gluconate 50 MG tablet, Take 50 mg by mouth daily. *  These medications belong to multiple therapeutic classes and are listed under each applicable group.  Allergies:  Allergies  Allergen Reactions   Chantix [Varenicline] Anaphylaxis and Swelling    Makes throat swell up   Moxifloxacin Hcl In Nacl Other (See Comments)    aches    Problem list She has Seasonal and perennial allergic rhinitis; Depression; Hyperlipidemia; Other abnormal glucose (prediabetes); Vitamin D deficiency; Former smoker; Medication management; Hx of adenomatous colonic polyps;  Costochondritis; COPD mixed type ([place]); Hot flashes; Obesity (BMI 30.0-34.9); Genetic testing; Aortic atherosclerosis ([place]); Fatty liver; [place]atston coronary artery calcium score greater than 400; Lung cancer ([place]); Hormone replacement therapy; Arthritis; Gastroesophageal reflux disease; Osteopenia; Primary osteoarthritis of first carpometacarpal joint of left hand; Family history of cancer; VIN III (vulvar intraepithelial neoplasia III); Memory changes; and Liver lesion, right lobe on their problem list.   Social History:   reports that she quit smoking about 2 years ago. Her smoking use included cigarettes. She has a 50.00 pack-year smoking history. She has never used smokeless tobacco. She reports that she does not drink alcohol and does not use drugs.  Observations/Objective:  General : Well sounding patient in no apparent distress HEENT: no hoarseness, no cough for duration of visit Lungs: speaks in complete sentences, no audible wheezing, no apparent distress Neurological: alert, oriented x 3 Psychiatric: pleasant, judgement appropriate   Assessment and Plan:  Covid 19 Covid 19 positive per rapid screening test at home yesterday Risk factors include: partial left lung removed Symptoms are: mild       Immue support with vitamin D 1000 mg daily, zinc 50 mg daily, vitamin D Reviewed doing regular breathing exercises, proning Z-Pak , Decadron taper, Tessalon Perles and Promethazine cough syrup [facility] Take tylenol PRN temp 101+ Push hydration Regular ambulation or calf exercises exercises for clot prevention and 81 mg ASA unless contraindicated Sx supportive therapy suggested Follow up via mychart or telephone if needed Advised patient obtain O2 monitor; present to ED if persistently <88% or with severe dyspnea, CP, fever uncontrolled by tylenol, confusion, sudden decline Should remain in isolation until at least 5 days from onset of sx, 24-48 hours fever free without tylenol, sx such  as cough are improved.      Follow Up Instructions:  I discussed the assessment and treatment plan with the patient. The patient was provided an opportunity to ask questions and all were answered. The patient agreed with the plan and demonstrated an understanding of the instructions.   The patient was advised to call back or seek an in-person evaluation if the symptoms worsen or if the condition fails to improve as anticipated.  I provided 20 minutes of non-face-to-face time during this encounter.  Magda Bernheim ANP-C  [facility]  02/28/2021

## 2021-03-04 ENCOUNTER — Ambulatory Visit: Payer: Medicare Other | Admitting: Internal Medicine

## 2021-03-16 ENCOUNTER — Other Ambulatory Visit: Payer: Self-pay | Admitting: Cardiovascular Disease

## 2021-04-07 DIAGNOSIS — I10 Essential (primary) hypertension: Secondary | ICD-10-CM | POA: Insufficient documentation

## 2021-04-07 MED ORDER — VITAMIN D-3 125 MCG (5000 UT) PO TABS: ORAL_TABLET | ORAL | 0 refills | Status: AC

## 2021-04-07 NOTE — Patient Instructions (Signed)

## 2021-04-07 NOTE — Progress Notes (Signed)
Future Appointments  Date Time Provider [place]  04/08/2021  9:30 AM [name], MD [facility] None  08/07/2021   -  CPE  9:00 AM Liane Comber, NP [facility] None  10/17/2021  3:00 PM Deneise Lever, MD [facility] None  12/31/2021     - Wellness  3:30 PM Liane Comber, NP [facility] None    History of Present Illness:       This very nice 70 y.o. [name] presents for 3 month follow up with HTN, HLD, Pre-Diabetes and Vitamin D Deficiency. Patient is followed by Dr Keturah Barre and she also has hx/o VATS in Mar 2020 for  Small Cell LUL Lung Ca.        Patient is treated for labile HTN & has minimal NonIschemic CAD. BP has been controlled at home. Today's BP is at goal -  114/72. Patient has had no complaints of any cardiac type chest pain, palpitations, dyspnea / orthopnea / PND, dizziness, claudication, or dependent edema.       Hyperlipidemia is controlled with diet & Rosuvastatin.  Patient denies myalgias or other med SE's. Last Lipids were  Lab Results  Component Value Date   CHOL 161 12/31/2020   HDL 60 12/31/2020   LDLCALC 82 12/31/2020   TRIG 99 12/31/2020   CHOLHDL 2.7 12/31/2020     Also, the patient has history of PreDiabetes (A1c 6.2 /2015 & 2016) and has had no symptoms of reactive hypoglycemia, diabetic polys, paresthesias or visual blurring.  Last A1c was not at goal:  Lab Results  Component Value Date   HGBA1C 6.2 (H) 12/31/2020                                                          Further, the patient also has history of Vitamin D Deficiency and supplements vitamin D without any suspected side-effects. Last vitamin D was at goal (70-100):  Lab Results  Component Value Date   VD25OH 68 12/31/2020     Current Outpatient Medications on File Prior to Visit  Medication Sig   metoprolol succinate-XL 25 MG  TAKE 1 TABLET EVERY DAY   albuterol (2.5 MG/3ML) 0.083% neb soln Take 3 mLs by neb every 6 hours as needed    Albuterol  HFA   inhaler USE 2 INHALATIONS  EVERY 4 HRS TO RESCUE ASTHMA ATTACK   aspirin EC 81 MG tablet Take 81 mg by mouth daily.   Calcium Carbonate Antacid  Take 1,200 mg  daily with breakfast.   cetirizine  10 MG tablet Take  daily.   VITAMIN D 5000 u Take 10,000 Units daily   diphenhydrAMINE 25 MG tablet Take 25 mg by mouth daily.   ibuprofen  200 MG tablet Take 600 mg every 8  hours as needed    imiquimod (ALDARA) 5 % cream as needed.   pseudoephedrine  120 MG 12 hr tablet Take  1 tablet  2 x /day     rosuvastatin 40 MG tablet TAKE ONE TABLET DAILY   sertraline 100 MG tablet TAKE 1 TABLET DAILY FOR MOOD   zinc 50 MG tablet Take daily.    Allergies  Allergen Reactions   Chantix [Varenicline] Anaphylaxis and Swelling    Makes throat swell up   Moxifloxacin Hcl In  Nacl Other (See Comments)    aches   PMHx:   Past Medical History:  Diagnosis Date   Acute perforated appendicitis 10/31/2014   Adenomatous colon polyp    Allergic rhinitis    Allergy    Anemia    Arthritis    "fingers" (10/31/2014)   Chronic bronchitis ([place])    "got it q yr til I had my nose OR" (10/31/2014)   COPD (chronic obstructive pulmonary disease) ([place])    Coronary artery disease    DDD (degenerative disc disease), cervical    DDD (degenerative disc disease), lumbar    Depression    "short term when my sister passed away unexpectedly"   Family history of adverse reaction to anesthesia    "mother is hard to wake up; a little goes a long way w/her"   Family history of breast cancer    Family history of colon cancer    Family history of skin cancer    GERD (gastroesophageal reflux disease)    History of stomach ulcers    Hyperlipidemia    "borderline; RX is preventative" (10/31/2014)   Kidney stones    "they passed"   Lung cancer ([place]) 09/28/2018   Migraine    "used to get them alot; don't get them anymore" (10/31/2014)   Osteoarthritis of both knees    Osteopenia    Pneumonia 2003 X 1   Pre-diabetes    Seizures ([place])  ~ 1962 X 1   "from an abscessed wisdom tooth"   Tobacco dependence    Vitamin D deficiency     Immunization History  Administered Date(s) Administered   Influenza Split 05/16/2013, 05/12/2014, 05/14/2015   Influenza, High Dose  05/15/2017, 05/07/2018, 03/29/2019   Influenza, Seasonal 05/13/2016   Influenza 07/23/2020   PFIZER  SARS-COV-2 Vacc 10/01/2019, 10/22/2019, 05/17/2020   Pneumococcal -13 11/12/2016   Pneumococcal ---23 06/14/2011   Tdap 03/27/2010   Unspecified SARS-COV-2 Vacc 10/01/2019, 10/22/2019   Zoster, Live 04/14/2012     Past Surgical History:  Procedure Laterality Date   APPENDECTOMY     ARTHROTOMY Right 01/11/2016   Procedure: DISTAL INTERPHALANGEAL JOINT ARTHROTOMY RIGHT INDEX FINGER;  Surgeon: Leanora Cover, MD;  Location: [place];  Service: Orthopedics;  Laterality: Right;   BREAST LUMPECTOMY WITH RADIOACTIVE SEED LOCALIZATION Right 08/01/2020   Procedure: RIGHT BREAST LUMPECTOMY WITH RADIOACTIVE SEED LOCALIZATION;  Surgeon: [name], MD;  Location: [place];  Service: General;  Laterality: Right;  LMA   COLONOSCOPY     CYST EXCISION Right 01/11/2016   Procedure: EXCISION MUCOID CYST;  Surgeon: Leanora Cover, MD;  Location: [place];  Service: Orthopedics;  Laterality: Right;   GANGLION CYST EXCISION Right 02/21/2020   Procedure: REMOVAL GANGLION OF WRIST;  Surgeon: Leanora Cover, MD;  Location: [place];  Service: Orthopedics;  Laterality: Right;   LAPAROSCOPIC APPENDECTOMY N/A 10/31/2014   Procedure: APPENDECTOMY LAPAROSCOPIC;  Surgeon: Alphonsa Overall, MD;  Location: [place];  Service: General;  Laterality: N/A;   LEFT HEART CATH AND CORONARY ANGIOGRAPHY N/A 09/06/2018   Procedure: LEFT HEART CATH AND CORONARY ANGIOGRAPHY;  Surgeon: Troy Sine, MD;  Location: [place] [facility] [facility];  Service: Cardiovascular;  Laterality: N/A;   POLYPECTOMY     SALPINGOOPHORECTOMY Right 1999   "w/grapefruit-sized polyp"   SHOULDER  ARTHROSCOPY Right 2003   removal spurs   SHOULDER ARTHROSCOPY W/ ROTATOR CUFF REPAIR Left 2012   SINUS EXPLORATION  12/2013   [place]   TENDON REPAIR Left  10/2009   torn tendon @ elbow   VAGINAL HYSTERECTOMY  1978   VIDEO ASSISTED THORACOSCOPY (VATS)/WEDGE RESECTION Left 09/28/2018   Procedure: LEFT VIDEO ASSISTED THORACOSCOPY (VATS)/Completion Left Upper lobe Lobectomy. Lymph node dissection. Intercostal nerve block.;  Surgeon: Grace Isaac, MD;  Location: [place];  Service: Thoracic;  Laterality: Left;   VIDEO BRONCHOSCOPY N/A 09/28/2018   Procedure: VIDEO BRONCHOSCOPY;  Surgeon: Grace Isaac, MD;  Location: [facility];  Service: Thoracic;  Laterality: N/A;    FHx:    Reviewed / unchanged  SHx:    Reviewed / unchanged   Systems Review:  Constitutional: Denies fever, chills, wt changes, headaches, insomnia, fatigue, night sweats, change in appetite. Eyes: Denies redness, blurred vision, diplopia, discharge, itchy, watery eyes.  ENT: Denies discharge, congestion, post nasal drip, epistaxis, sore throat, earache, hearing loss, dental pain, tinnitus, vertigo, sinus pain, snoring.  CV: Denies chest pain, palpitations, irregular heartbeat, syncope, dyspnea, diaphoresis, orthopnea, PND, claudication or edema. Respiratory: denies cough, dyspnea, DOE, pleurisy, hoarseness, laryngitis, wheezing.  Gastrointestinal: Denies dysphagia, odynophagia, heartburn, reflux, water brash, abdominal pain or cramps, nausea, vomiting, bloating, diarrhea, constipation, hematemesis, melena, hematochezia  or hemorrhoids. Genitourinary: Denies dysuria, frequency, urgency, nocturia, hesitancy, discharge, hematuria or flank pain. Musculoskeletal: Denies arthralgias, myalgias, stiffness, jt. swelling, pain, limping or strain/sprain.  Skin: Denies pruritus, rash, hives, warts, acne, eczema or change in skin lesion(s). Neuro: No weakness, tremor, incoordination, spasms, paresthesia or pain. Psychiatric: Denies  confusion, memory loss or sensory loss. Endo: Denies change in weight, skin or hair change.  Heme/Lymph: No excessive bleeding, bruising or enlarged lymph nodes.  Physical Exam  BP 114/72   Pulse 72   Temp 97.6 F (36.4 C)   Resp 16   Ht 5\' 3"  (1.6 m)   Wt 186 lb (84.4 kg)   SpO2 98%   BMI 32.95 kg/m   Appears  well nourished, well groomed  and in no distress.  Eyes: PERRLA, EOMs, conjunctiva no swelling or erythema. Sinuses: No frontal/maxillary tenderness ENT/Mouth: EAC's clear, TM's nl w/o erythema, bulging. Nares clear w/o erythema, swelling, exudates. Oropharynx clear without erythema or exudates. Oral hygiene is good. Tongue normal, non obstructing. Hearing intact.  Neck: Supple. Thyroid not palpable. Car 2+/2+ without bruits, nodes or JVD. Chest: Respirations nl with BS clear & equal w/o rales, rhonchi, wheezing or stridor.  Cor: Heart sounds normal w/ regular rate and rhythm without sig. murmurs, gallops, clicks or rubs. Peripheral pulses normal and equal  without edema.  Abdomen: Soft & bowel sounds normal. Non-tender w/o guarding, rebound, hernias, masses or organomegaly.  Lymphatics: Unremarkable.  Musculoskeletal: Full ROM all peripheral extremities, joint stability, 5/5 strength and normal gait.  Skin: Warm, dry without exposed rashes, lesions or ecchymosis apparent.  Neuro: Cranial nerves intact, reflexes equal bilaterally. Sensory-motor testing grossly intact. Tendon reflexes grossly intact.  Pysch: Alert & oriented x 3.  Insight and judgement nl & appropriate. No ideations.  Assessment and Plan:  1. Essential hypertension  - Continue medication, monitor blood pressure at home.  - Continue DASH diet.  Reminder to go to the ER if any CP,  SOB, nausea, dizziness, severe HA, changes vision/speech.   - CBC with Differential/Platelet - COMPLETE METABOLIC PANEL WITH GFR - Magnesium - TSH  2. Hyperlipidemia, mixed  - Continue diet/meds, exercise,& lifestyle  modifications.  - Continue monitor periodic cholesterol/liver & renal functions    - Lipid panel - TSH  3. Abnormal glucose  - Continue diet, exercise  - Lifestyle modifications.  -  Monitor appropriate labs   - Hemoglobin A1c - Insulin, random  4. Vitamin D deficiency  - Continue supplementation.  5. Medication management  - CBC with Differential/Platelet - COMPLETE METABOLIC PANEL WITH GFR - Magnesium - Lipid panel - TSH - Hemoglobin A1c - Insulin, random         Discussed  regular exercise, BP monitoring, weight control to achieve/maintain BMI less than 25 and discussed med and SE's. Recommended labs to assess and monitor clinical status with further disposition pending results of labs.  I discussed the assessment and treatment plan with the patient. The patient was provided an opportunity to ask questions and all were answered. The patient agreed with the plan and demonstrated an understanding of the instructions.  I provided over 30 minutes of exam, counseling, chart review and  complex critical decision making.        The patient was advised to call back or seek an in-person evaluation if the symptoms worsen or if the condition fails to improve as anticipated.   Kirtland Bouchard, MD

## 2021-04-08 ENCOUNTER — Ambulatory Visit (INDEPENDENT_AMBULATORY_CARE_PROVIDER_SITE_OTHER): Payer: Medicare Other | Admitting: Internal Medicine

## 2021-04-08 ENCOUNTER — Encounter: Payer: Self-pay | Admitting: Internal Medicine

## 2021-04-08 ENCOUNTER — Other Ambulatory Visit: Payer: Self-pay

## 2021-04-08 VITALS — BP 114/72 | HR 72 | Temp 97.6°F | Resp 16 | Ht 63.0 in | Wt 186.0 lb

## 2021-04-08 DIAGNOSIS — R7309 Other abnormal glucose: Secondary | ICD-10-CM

## 2021-04-08 DIAGNOSIS — I10 Essential (primary) hypertension: Secondary | ICD-10-CM

## 2021-04-08 DIAGNOSIS — Z79899 Other long term (current) drug therapy: Secondary | ICD-10-CM

## 2021-04-08 DIAGNOSIS — E559 Vitamin D deficiency, unspecified: Secondary | ICD-10-CM

## 2021-04-08 DIAGNOSIS — E782 Mixed hyperlipidemia: Secondary | ICD-10-CM

## 2021-04-08 DIAGNOSIS — I2510 Atherosclerotic heart disease of native coronary artery without angina pectoris: Secondary | ICD-10-CM | POA: Diagnosis not present

## 2021-04-09 DIAGNOSIS — N6459 Other signs and symptoms in breast: Secondary | ICD-10-CM | POA: Diagnosis not present

## 2021-04-09 DIAGNOSIS — M8580 Other specified disorders of bone density and structure, unspecified site: Secondary | ICD-10-CM | POA: Diagnosis not present

## 2021-04-09 DIAGNOSIS — D071 Carcinoma in situ of vulva: Secondary | ICD-10-CM | POA: Diagnosis not present

## 2021-04-09 DIAGNOSIS — Z1231 Encounter for screening mammogram for malignant neoplasm of breast: Secondary | ICD-10-CM | POA: Diagnosis not present

## 2021-04-09 DIAGNOSIS — Z6832 Body mass index (BMI) 32.0-32.9, adult: Secondary | ICD-10-CM | POA: Diagnosis not present

## 2021-04-09 DIAGNOSIS — N959 Unspecified menopausal and perimenopausal disorder: Secondary | ICD-10-CM | POA: Diagnosis not present

## 2021-04-09 DIAGNOSIS — Z124 Encounter for screening for malignant neoplasm of cervix: Secondary | ICD-10-CM | POA: Diagnosis not present

## 2021-04-09 LAB — CBC WITH DIFFERENTIAL/PLATELET
Absolute Monocytes: 373 cells/uL (ref 200–950)
Basophils Absolute: 49 cells/uL (ref 0–200)
Basophils Relative: 0.9 %
Eosinophils Absolute: 119 cells/uL (ref 15–500)
Eosinophils Relative: 2.2 %
HCT: 42.2 % (ref 35.0–45.0)
Hemoglobin: 14.2 g/dL (ref 11.7–15.5)
Lymphs Abs: 1355 cells/uL (ref 850–3900)
MCH: 29.9 pg (ref 27.0–33.0)
MCHC: 33.6 g/dL (ref 32.0–36.0)
MCV: 88.8 fL (ref 80.0–100.0)
MPV: 10.7 fL (ref 7.5–12.5)
Monocytes Relative: 6.9 %
Neutro Abs: 3505 cells/uL (ref 1500–7800)
Neutrophils Relative %: 64.9 %
Platelets: 193 10*3/uL (ref 140–400)
RBC: 4.75 10*6/uL (ref 3.80–5.10)
RDW: 13.9 % (ref 11.0–15.0)
Total Lymphocyte: 25.1 %
WBC: 5.4 10*3/uL (ref 3.8–10.8)

## 2021-04-09 LAB — COMPLETE METABOLIC PANEL WITH GFR
AG Ratio: 1.6 (calc) (ref 1.0–2.5)
ALT: 36 U/L — ABNORMAL HIGH (ref 6–29)
AST: 52 U/L — ABNORMAL HIGH (ref 10–35)
Albumin: 4.4 g/dL (ref 3.6–5.1)
Alkaline phosphatase (APISO): 63 U/L (ref 37–153)
BUN: 14 mg/dL (ref 7–25)
CO2: 25 mmol/L (ref 20–32)
Calcium: 10 mg/dL (ref 8.6–10.4)
Chloride: 107 mmol/L (ref 98–110)
Creat: 0.82 mg/dL (ref 0.50–1.05)
Globulin: 2.8 g/dL (calc) (ref 1.9–3.7)
Glucose, Bld: 105 mg/dL — ABNORMAL HIGH (ref 65–99)
Potassium: 4.3 mmol/L (ref 3.5–5.3)
Sodium: 140 mmol/L (ref 135–146)
Total Bilirubin: 0.6 mg/dL (ref 0.2–1.2)
Total Protein: 7.2 g/dL (ref 6.1–8.1)
eGFR: 77 mL/min/{1.73_m2} (ref >=60)

## 2021-04-09 LAB — LIPID PANEL
Cholesterol: 176 mg/dL (ref <200)
HDL: 76 mg/dL (ref >=50)
LDL Cholesterol (Calc): 80 mg/dL (calc)
Non-HDL Cholesterol (Calc): 100 mg/dL (calc) (ref <130)
Total CHOL/HDL Ratio: 2.3 (calc) (ref <5.0)
Triglycerides: 103 mg/dL (ref <150)

## 2021-04-09 LAB — HEMOGLOBIN A1C
Hgb A1c MFr Bld: 6.1 % of total Hgb — ABNORMAL HIGH (ref <5.7)
Mean Plasma Glucose: 128 mg/dL
eAG (mmol/L): 7.1 mmol/L

## 2021-04-09 LAB — TSH: TSH: 2 mIU/L (ref 0.40–4.50)

## 2021-04-09 LAB — HM MAMMOGRAPHY

## 2021-04-09 LAB — HM PAP SMEAR

## 2021-04-09 LAB — MAGNESIUM: Magnesium: 2.3 mg/dL (ref 1.5–2.5)

## 2021-04-09 LAB — INSULIN, RANDOM: Insulin: 28.9 u[IU]/mL — ABNORMAL HIGH

## 2021-04-09 NOTE — Progress Notes (Signed)
============================================================ -   Test results slightly outside the reference range are not unusual. If there is anything important, I will review this with you,  otherwise it is considered normal test values.  If you have further questions,  please do not hesitate to contact me at the office or via My Chart.  ============================================================ ============================================================  -  Liver enzymes are slightly elevated   - most likely due to Fatty Liver & the only treatment is Weight Loss  ! ============================================================ ============================================================  -  Total Chol = 176   a  &   LDL Chol = 80     -   Both  Excellent   - Very low risk for Heart Attack  / Stroke ============================================================ ============================================================  -  A1c = 6.1%            (  Normal is less than 5.7%  !  )    Blood sugar and A1c are Still elevated in the borderline and  early or pre-diabetes range which has the same   300% increased risk for heart attack, stroke, cancer and   alzheimer- type vascular dementia as full blown diabetes.   But the good news is that diet, exercise with  weight loss can cure the early diabetes at this point.   -  It is very important that you work harder with diet by  avoiding all foods that are white except chicken,   fish & calliflower.  - Avoid white rice  (brown & wild rice is OK),   - Avoid white potatoes  (sweet potatoes in moderation is OK),   White bread or wheat bread or anything made out of   white flour like bagels, donuts, rolls, buns, biscuits, cakes,  - pastries, cookies, pizza crust, and pasta (made from  white flour & egg whites)   - vegetarian pasta or spinach or wheat pasta is OK.  - Multigrain breads like Arnold's, Pepperidge Farm or    multigrain sandwich thins or high fiber breads like   [place] bread or "Dave's Killer" breads that are  4 to 5 grams fiber per slice !  are best.    Diet, exercise and weight loss can reverse and cure  diabetes in the early stages.   ============================================================ ============================================================  -  All Else - CBC - Kidneys - Electrolytes - Magnesium & Thyroid    - all  Normal / OK ============================================================ ============================================================

## 2021-04-12 ENCOUNTER — Other Ambulatory Visit: Payer: Self-pay | Admitting: Internal Medicine

## 2021-04-29 DIAGNOSIS — M25531 Pain in right wrist: Secondary | ICD-10-CM | POA: Diagnosis not present

## 2021-04-29 DIAGNOSIS — M19031 Primary osteoarthritis, right wrist: Secondary | ICD-10-CM | POA: Diagnosis not present

## 2021-04-29 DIAGNOSIS — M1811 Unilateral primary osteoarthritis of first carpometacarpal joint, right hand: Secondary | ICD-10-CM | POA: Diagnosis not present

## 2021-05-14 DIAGNOSIS — D1801 Hemangioma of skin and subcutaneous tissue: Secondary | ICD-10-CM | POA: Diagnosis not present

## 2021-05-14 DIAGNOSIS — L820 Inflamed seborrheic keratosis: Secondary | ICD-10-CM | POA: Diagnosis not present

## 2021-05-14 DIAGNOSIS — L812 Freckles: Secondary | ICD-10-CM | POA: Diagnosis not present

## 2021-05-14 DIAGNOSIS — L821 Other seborrheic keratosis: Secondary | ICD-10-CM | POA: Diagnosis not present

## 2021-05-31 ENCOUNTER — Other Ambulatory Visit: Payer: Self-pay | Admitting: Internal Medicine

## 2021-06-06 ENCOUNTER — Telehealth: Payer: Self-pay | Admitting: Internal Medicine

## 2021-06-06 NOTE — Telephone Encounter (Signed)
Pt wants CY to take off Albuterol inhaler and put pt Anoro elipta back on regimen. Pt switching insurance 07/28/2021 [facility] like [facility] called into [facility] [address](effective immediately(will need by the end of this month)pt states she thinks she was getting a 73mo supply. Please advise [phone]

## 2021-06-07 NOTE — Telephone Encounter (Signed)
I have attempted to call the pt but the line doesn't ring and just hangs up.  Will try back later.

## 2021-06-07 NOTE — Telephone Encounter (Signed)
She should keep an albuterol hfa rescue inhaler on her med list for occasioanl "rescue" use, even if she never fills it. Ok to order Anoro, # 3, inhale 1 puff, once daily , ref x 3

## 2021-06-07 NOTE — Telephone Encounter (Signed)
CY - please advise. Thanks! 

## 2021-06-18 MED ORDER — ANORO ELLIPTA 62.5-25 MCG/ACT IN AEPB: 1.0000 | INHALATION_SPRAY | Freq: Every day | RESPIRATORY_TRACT | 3 refills | Status: DC

## 2021-06-18 NOTE — Telephone Encounter (Signed)
Called and spoke with pt letting her know the info stated by CY about inhalers and she verbalized understanding. Rx for anoro has been sent to preferred pharamcy for pt. Nothing further needed.

## 2021-08-06 NOTE — Progress Notes (Signed)
CPE  Assessment:    Encounter for Annual Physical Exam with abnormal findings Due annually  Health Maintenance reviewed Healthy lifestyle reviewed and goals set  CAD/coronary calcium elevated 400+ Dr. Claiborne Billings following LDL goal <70, titrate medications Control blood pressure, cholesterol, glucose, increase exercise.  Go to the ER if any chest pain, shortness of breath, nausea, dizziness, syncope - lipid panel  Agatston coronary artery calcium score greater than 400 Control blood pressure, cholesterol, glucose, increase exercise.  Following with Dr. Claiborne Billings  Aortic atherosclerosis [facility]) - CT 05/2017 Control blood pressure, cholesterol, glucose, increase exercise.   COPD mixed type ([place]) No triggers, well controlled symptoms, cont to monitor  Malignant neoplasm of upper lobe of left lung ([place]) S/p lobectomy, continue follow up with surgeon  Recurrent major depressive disorder, in full remission ([place]) Continue medication Lifestyle discussed: diet/exerise, sleep hygiene, stress management, hydration -     TSH  Mixed hyperlipidemia -     Lipid panel LDL goal <70, not at goal despite rosuvastatin 40 mg, discussed and add zetia 10 mg daily  check lipids decrease fatty foods increase activity.   Medication management -     CBC with Differential/Platelet -     COMPLETE METABOLIC PANEL WITH GFR -     Magnesium  Obesity - BMI 31 - follow up 3 months for progress monitoring - increase veggies, decrease carbs - long discussion about weight loss, diet, and exercise  Fatty liver -     COMPLETE METABOLIC PANEL WITH GFR Weight loss advised, will monitor LFTs  Hot flashes Continue meds, off of estrogen, GYN managing  Hx of adenomatous colonic polyps UTD; increase fiber  Abnormal glucose Discussed disease progression and risks Discussed diet/exercise, weight management and risk modification -     Hemoglobin A1c  B12 deficiency -     Vitamin B12  Vitamin D  deficiency Continue supplement  Osteopenia - get dexa q2y, continue Vit D and Ca supplement if needed, weight bearing exercises  Former smoker (50 pack year, quit 2020) Pulmonology following with CTs  Memory changes Stable; monitor   Need for influenza vaccine High dose quadrivalent administered without complication today    Orders Placed This Encounter  Procedures   CBC with Differential/Platelet   COMPLETE METABOLIC PANEL WITH GFR   Magnesium   Lipid panel   TSH   Hemoglobin A1c   VITAMIN D 25 Hydroxy (Vit-D Deficiency, Fractures)   Vitamin B12   Microalbumin / creatinine urine ratio   Urinalysis, Routine w reflex microscopic   Hepatitis C antibody     Over 40 minutes of exam, counseling, chart review and critical decision making was performed Future Appointments  Date Time Provider [place]  10/17/2021  3:00 PM Deneise Lever, MD [facility] None  12/31/2021  3:30 PM Liane Comber, NP [facility] None  08/07/2022  9:00 AM Liane Comber, NP [facility] None    Subjective:  Karen Barrera is a 71 y.o. female who presents for CPE. She has Seasonal and perennial allergic rhinitis; Depression; Hyperlipidemia; Other abnormal glucose (prediabetes); Vitamin D deficiency; Former smoker; Medication management; Hx of adenomatous colonic polyps; Costochondritis; COPD mixed type ([place]); Hot flashes; Obesity (BMI 30.0-34.9); Genetic testing; Aortic atherosclerosis ([place]) - per CT 05/2017; Fatty liver; Agatston coronary artery calcium score greater than 400; Lung cancer ([place]); Hormone replacement therapy; Arthritis; Gastroesophageal reflux disease; Osteopenia; Primary osteoarthritis of first carpometacarpal joint of left hand; Family history of cancer; VIN III (vulvar intraepithelial neoplasia III); Memory changes; Liver lesion, right lobe;  and Essential hypertension on their problem list.  She is married, 51 years, 2 children, 4 grandkids, 1 greatgrand, all local. She is  retired Chief Executive Officer for [employer]. Enjoys retirement.  Mom and dad still living. One grandson lives with her.   Remote TAH. She is recently following with [facility]YN [facility]ston [facility] and Dr. Denman George for VIN III, planning q90m follow up. Still has hot flashes, but off of estrogen due to cancer. Gabapentin didn't help.   Recently on 08/01/2020 had R lumpectomy by Dr. Ninfa Linden for 0.6 x 0.7 x 0.3 lesion was seen at the 1 o'clock position of the right breast near the nipple areolar complex seen on recent mammogram.  A biopsy of this was performed showing a complex sclerosing lesion, biopsy returned benign. Can resume routine annual mammograms.   She has COPD, s/p left lobectomy for stage 2 non small cell carcinoma on 09/28/2018 with Dr. Servando Snare. She has not smoked since March 3rd 2020, 50 pack year hx. Occasional dry cough but has improved, she uses albuterol to take as needed, didn't see benefit with anoro. Had CT 10/04/2020 showing no evidence of recurrent or metastatic disease. Now follows with Dr. Annamaria Boots, doing ongoing imaging through him.   Recurrent lung imaging has showed hepatic steatosis and R lobe lesion, originally noted on CT 10/2014 and characterized as cyst at that time, approx 3 cm, overall fairly stable on serial imaging since that time, felt likely cyst without concerning features. MRI has been suggested if needing to characterize further.  She is on prilosec 40 mg, recently takes PRN, hasn't needed in some time.  BMI is Body mass index is 31.55 kg/m., she has been working on diet and exercise, trying to lose weight, down from 199 lb, doing fruits and veggies. Very active running after her great grandson. Admits to drinking too much diet Dr. Malachi Bonds. Wants to work on this year.  Wt Readings from Last 3 Encounters:  08/07/21 183 lb 12.8 oz (83.4 kg)  04/08/21 186 lb (84.4 kg)  12/31/20 185 lb (83.9 kg)   She follows with Dr. Claiborne Billings for [place]; Ct chest 2018 showed  aortic atherosclerosis; Calcium score was significantly increased at 891 placing her in the 97th percentile for age and gender. Cath 08/2018 revealed evidence for coronary calcification with mild nonobstructive CAD for which medical therapy was recommended.  Her blood pressure has been controlled at home, today their BP is BP: 106/76 She does not workout, she is cleaning, being active inside and watches great grandson but not doing any working out. .She denies chest pain, shortness of breath, dizziness.   She is on cholesterol medication (rosuvastatin 40 mg daily)  and denies myalgias. Her cholesterol is not at goal of LDL minimum <70, ideally <50. The cholesterol last visit was:   Lab Results  Component Value Date   CHOL 176 04/08/2021   HDL 76 04/08/2021   LDLCALC 80 04/08/2021   TRIG 103 04/08/2021   CHOLHDL 2.3 04/08/2021   She has prediabetes, has been working on diet/exericse. Denies diabetic polys. Last A1C:  Lab Results  Component Value Date   HGBA1C 6.1 (H) 04/08/2021   Last GFR: Lab Results  Component Value Date   GFRNONAA 65 12/31/2020   Patient is on Vitamin D supplement, taking 10000 IU  Lab Results  Component Value Date   VD25OH 89 12/31/2020     She has not started on a b12 supplement;  Lab Results  Component Value Date  [id number] 431 08/07/2020      Medication Review:   Current Outpatient Medications (Cardiovascular):    ezetimibe (ZETIA) 10 MG tablet, Take 1 tab daily in the evening for cholesterol.   metoprolol succinate (TOPROL-XL) 25 MG 24 hr tablet, TAKE 1 TABLET BY MOUTH EVERY DAY   rosuvastatin (CRESTOR) 40 MG tablet, TAKE ONE TABLET BY MOUTH DAILY  Current Outpatient Medications (Respiratory):    albuterol (PROVENTIL) (2.5 MG/3ML) 0.083% nebulizer solution, Take 3 mLs (2.5 mg total) by nebulization every 6 (six) hours as needed for wheezing or shortness of breath.   albuterol (VENTOLIN HFA) 108 (90 Base) MCG/ACT inhaler, USE 2 INHALATIONS 25  MINUTES APART EVERY 4 HOURS IF NEEDED TO RESCUE ASTHMA ATTACK   cetirizine (ZYRTEC) 10 MG tablet, Take 10 mg by mouth daily.   diphenhydrAMINE (BENADRYL) 25 MG tablet, Take 25 mg by mouth daily.   pseudoephedrine (SUDAFED) 120 MG 12 hr tablet, Take  1 tablet  2 x /day (every 12 hours)   for Head and Chest Congestion   umeclidinium-vilanterol (ANORO ELLIPTA) 62.5-25 MCG/ACT AEPB, Inhale 1 puff into the lungs daily.  Current Outpatient Medications (Analgesics):    aspirin EC 81 MG tablet, Take 81 mg by mouth daily.   ibuprofen (ADVIL,MOTRIN) 200 MG tablet, Take 600 mg by mouth every 8 (eight) hours as needed for headache or moderate pain.    Current Outpatient Medications (Other):    Calcium Carbonate Antacid (CALCIUM CARBONATE PO)*, Take 1,200 mg by mouth daily with breakfast.   Cholecalciferol (VITAMIN D-3) 125 MCG (5000 UT) TABS, Take 2 caps (5,000) units = 10,000 units /daily   Ginkgo Biloba 40 MG TABS, Take by mouth.   sertraline (ZOLOFT) 100 MG tablet, TAKE 1 TABLET BY MOUTH DAILY FOR MOOD   zinc gluconate 50 MG tablet, Take 50 mg by mouth daily.   imiquimod (ALDARA) 5 % cream, as needed. * These medications belong to multiple therapeutic classes and are listed under each applicable group.   Allergies  Allergen Reactions   Chantix [Varenicline] Anaphylaxis and Swelling    Makes throat swell up   Moxifloxacin Hcl In Nacl Other (See Comments)    aches    Current Problems (verified) Patient Active Problem List   Diagnosis Date Noted   Essential hypertension 04/07/2021   Memory changes 12/31/2020   Liver lesion, right lobe 12/31/2020   VIN III (vulvar intraepithelial neoplasia III) 05/11/2020   Primary osteoarthritis of first carpometacarpal joint of left hand 04/27/2019   Hormone replacement therapy 04/06/2019   Arthritis 04/06/2019   Gastroesophageal reflux disease 04/06/2019   Osteopenia 04/06/2019   Family history of cancer 04/06/2019   Lung cancer ([place]) 09/28/2018    Agatston coronary artery calcium score greater than 400    Fatty liver 08/29/2018   Aortic atherosclerosis ([place]) - per CT 05/2017 08/06/2017   Genetic testing 04/24/2017   Hot flashes 11/12/2016   Obesity (BMI 30.0-34.9) 11/12/2016   COPD mixed type ([place]) 06/05/2016   Costochondritis 03/23/2015   Hx of adenomatous colonic polyps 01/23/2015   Medication management 11/21/2014   Depression    Hyperlipidemia    Other abnormal glucose (prediabetes)    Vitamin D deficiency    Former smoker    Seasonal and perennial allergic rhinitis 07/03/2013    Screening Tests Immunization History  Administered Date(s) Administered   Influenza Split 05/16/2013, 05/12/2014, 05/14/2015   Influenza, High Dose Seasonal PF 05/15/2017, 05/07/2018, 03/29/2019   Influenza, Seasonal, Injecte, Preservative Fre 05/13/2016   Influenza-Unspecified 07/23/2020  PFIZER(Purple Top)SARS-COV-2 Vaccination 10/01/2019, 10/22/2019, 05/17/2020   Pneumococcal Conjugate-13 11/12/2016   Pneumococcal Polysaccharide-23 06/14/2011   Tdap 03/27/2010   Unspecified SARS-COV-2 Vaccination 10/01/2019, 10/22/2019   Zoster, Live 04/14/2012    Tetanus: 2011 due, will get PRN Pneumovax:  2012 Prevnar 13: 2018 - ask Dr. Annamaria Boots about booster Flu vaccine: 2021, DUE TODAY  Zostavax: 2013 COVID: 2/2, 2021 + booster Shingrix 2021 has gotten both at pharmacy -  need documentation  MGM: 03/2020 diagnostic, getting annual screening via GYN, has had in 2022 per patient, report requested DEXA: 2019  At GYN Osteopenia will get at GYN PAP getting by GYN, vulvar lesion is monitored annually Colonoscopy: 05/2020 due in 3 years, 5 polyps, Dr. Carlean Purl  PFT 08/2018 CT chest 03/2019 Cath 08/2018  Names of Other Physician/Practitioners you currently use: 1. [facility] here for primary care 2. Dr. Dorena Cookey, eye doctor, last visit 2022 3. Dr. Luretha Rued, dentist 2022 4. Dr. Marland Kitchen derm, last visit 04/2021 for  total body check   Patient Care Team: [name], MD as PCP - General (Internal Medicine) Troy Sine, MD as PCP - Cardiology (Cardiology) Izora Gala, MD as Consulting Physician (Otolaryngology) Delila Pereyra, MD as Consulting Physician (Gynecology) Gatha Mayer, MD as Consulting Physician (Gastroenterology) Maple Hudson, MD as Referring Physician (Surgery)  SURGICAL HISTORY She  has a past surgical history that includes Shoulder arthroscopy w/ rotator cuff repair (Left, 2012); Tendon repair (Left, 10/2009); Shoulder arthroscopy (Right, 2003); Salpingoophorectomy (Right, 1999); Sinus exploration (12/2013); Vaginal hysterectomy (1978); laparoscopic appendectomy (N/A, 10/31/2014); Appendectomy; Colonoscopy; Cyst excision (Right, 01/11/2016); Arthrotomy (Right, 01/11/2016); LEFT HEART CATH AND CORONARY ANGIOGRAPHY (N/A, 09/06/2018); Video bronchoscopy (N/A, 09/28/2018); Video assisted thoracoscopy (vats)/wedge resection (Left, 09/28/2018); Ganglion cyst excision (Right, 02/21/2020); Polypectomy; and Breast lumpectomy with radioactive seed localization (Right, 08/01/2020). FAMILY HISTORY Her family history includes Alzheimer's disease in her father; Arthritis in her paternal grandmother; Cancer in an other family member; Colon cancer in an other family member; Colon cancer (age of onset: 17) in some other family members; Colon polyps in her father; Diabetes in her mother; Epilepsy in her sister; Hyperlipidemia in her maternal grandfather; Melanoma (age of onset: 39) in her paternal grandmother; Skin cancer (age of onset: 5) in her mother; Stroke in her maternal grandmother. SOCIAL HISTORY She  reports that she quit smoking about 2 years ago. Her smoking use included cigarettes. She has a 50.00 pack-year smoking history. She has never used smokeless tobacco. She reports that she does not drink alcohol and does not use drugs.    Review of Systems  Constitutional:  Positive for diaphoresis (hot  flashes, off of estrogen, GYN follows). Negative for malaise/fatigue and weight loss.  HENT:  Positive for congestion. Negative for ear pain, hearing loss, sinus pain and tinnitus.        Chronically decreased smell and taste, has seen ENT  Eyes: Negative.  Negative for blurred vision and double vision.  Respiratory:  Negative for cough, hemoptysis, sputum production, shortness of breath and wheezing.   Cardiovascular: Negative.  Negative for chest pain, palpitations, orthopnea, claudication and leg swelling.  Gastrointestinal:  Negative for abdominal pain, blood in stool, constipation, diarrhea, heartburn, melena, nausea and vomiting.  Genitourinary: Negative.   Musculoskeletal:  Positive for joint pain (mild, intermittent, bil knees, hands, ortho follows). Negative for back pain, falls, myalgias and neck pain.  Skin: Negative.  Negative for rash.  Neurological: Negative.  Negative for dizziness, tingling, sensory change, weakness and headaches.  Endo/Heme/Allergies:  Positive for environmental allergies. Negative for polydipsia.  Psychiatric/Behavioral:  Positive for memory loss (more difficulty with navigation, good short term recall ). Negative for depression, hallucinations, substance abuse and suicidal ideas. The patient is not nervous/anxious and does not have insomnia.   All other systems reviewed and are negative.   Objective:     Today's Vitals   08/07/21 0850  BP: 106/76  Pulse: 73  Temp: 97.7 F (36.5 C)  SpO2: 99%  Weight: 183 lb 12.8 oz (83.4 kg)  Height: 5\' 4"  (1.626 m)   Body mass index is 31.55 kg/m.  General appearance: alert, no distress, WD/WN, female HEENT: normocephalic, sclerae anicteric, TMs pearly, nares patent, no discharge or erythema, pharynx normal, no sinus tenderness Oral cavity: MMM, no lesions Neck: supple, no lymphadenopathy, no thyromegaly, no masses Heart: RRR, normal S1, S2, no murmur  Lungs: CTA bilaterally, no wheezes, rhonchi, or  rales Abdomen: +bs, soft, non-tender, non distended, no masses, no hepatomegaly, no splenomegaly Musculoskeletal: nontender, no swelling, no obvious deformity Extremities: no edema, no cyanosis, no clubbing Pulses: 2+ symmetric, upper and lower extremities, normal cap refill Neurological: alert, oriented x 3, CN2-12 intact, strength normal upper extremities and lower extremities, sensation normal throughout, DTRs 2+ throughout, no cerebellar signs, gait normal Psychiatric: normal affect, behavior normal, pleasant  GU/breast: defer to GYN  EKG: Defer to cardiology per patient    Izora Ribas, NP   08/07/2021

## 2021-08-07 ENCOUNTER — Ambulatory Visit (INDEPENDENT_AMBULATORY_CARE_PROVIDER_SITE_OTHER): Payer: Medicare HMO | Admitting: Adult Health

## 2021-08-07 ENCOUNTER — Other Ambulatory Visit: Payer: Self-pay

## 2021-08-07 ENCOUNTER — Encounter: Payer: Self-pay | Admitting: Adult Health

## 2021-08-07 VITALS — BP 106/76 | HR 73 | Temp 97.7°F | Ht 64.0 in | Wt 183.8 lb

## 2021-08-07 DIAGNOSIS — Z1329 Encounter for screening for other suspected endocrine disorder: Secondary | ICD-10-CM

## 2021-08-07 DIAGNOSIS — K760 Fatty (change of) liver, not elsewhere classified: Secondary | ICD-10-CM

## 2021-08-07 DIAGNOSIS — Z87891 Personal history of nicotine dependence: Secondary | ICD-10-CM

## 2021-08-07 DIAGNOSIS — E538 Deficiency of other specified B group vitamins: Secondary | ICD-10-CM

## 2021-08-07 DIAGNOSIS — Z0000 Encounter for general adult medical examination without abnormal findings: Secondary | ICD-10-CM | POA: Diagnosis not present

## 2021-08-07 DIAGNOSIS — C3490 Malignant neoplasm of unspecified part of unspecified bronchus or lung: Secondary | ICD-10-CM

## 2021-08-07 DIAGNOSIS — R931 Abnormal findings on diagnostic imaging of heart and coronary circulation: Secondary | ICD-10-CM

## 2021-08-07 DIAGNOSIS — Z79899 Other long term (current) drug therapy: Secondary | ICD-10-CM

## 2021-08-07 DIAGNOSIS — Z1389 Encounter for screening for other disorder: Secondary | ICD-10-CM | POA: Diagnosis not present

## 2021-08-07 DIAGNOSIS — K219 Gastro-esophageal reflux disease without esophagitis: Secondary | ICD-10-CM | POA: Diagnosis not present

## 2021-08-07 DIAGNOSIS — E669 Obesity, unspecified: Secondary | ICD-10-CM

## 2021-08-07 DIAGNOSIS — E782 Mixed hyperlipidemia: Secondary | ICD-10-CM

## 2021-08-07 DIAGNOSIS — I700 Atherosclerosis of aorta: Secondary | ICD-10-CM

## 2021-08-07 DIAGNOSIS — E559 Vitamin D deficiency, unspecified: Secondary | ICD-10-CM | POA: Diagnosis not present

## 2021-08-07 DIAGNOSIS — M8580 Other specified disorders of bone density and structure, unspecified site: Secondary | ICD-10-CM

## 2021-08-07 DIAGNOSIS — Z86010 Personal history of colonic polyps: Secondary | ICD-10-CM

## 2021-08-07 DIAGNOSIS — Z0001 Encounter for general adult medical examination with abnormal findings: Secondary | ICD-10-CM

## 2021-08-07 DIAGNOSIS — F3342 Major depressive disorder, recurrent, in full remission: Secondary | ICD-10-CM

## 2021-08-07 DIAGNOSIS — D071 Carcinoma in situ of vulva: Secondary | ICD-10-CM

## 2021-08-07 DIAGNOSIS — I10 Essential (primary) hypertension: Secondary | ICD-10-CM

## 2021-08-07 DIAGNOSIS — R7309 Other abnormal glucose: Secondary | ICD-10-CM | POA: Diagnosis not present

## 2021-08-07 DIAGNOSIS — Z1159 Encounter for screening for other viral diseases: Secondary | ICD-10-CM

## 2021-08-07 DIAGNOSIS — J449 Chronic obstructive pulmonary disease, unspecified: Secondary | ICD-10-CM

## 2021-08-07 MED ORDER — EZETIMIBE 10 MG PO TABS: ORAL_TABLET | ORAL | 3 refills | Status: DC

## 2021-08-07 NOTE — Patient Instructions (Addendum)
Karen Barrera , Thank you for taking time to come for your Annual Wellness Visit. I appreciate your ongoing commitment to your health goals. Please review the following plan we discussed and let me know if I can assist you in the future.   These are the goals we discussed:  Goals      DIET - DECREASE SODA OR JUICE INTAKE     Exercise 3x per week (30 min per time)     Resistance exercises/weight lifting     Weight (lb) < 160 lb (72.6 kg)        This is a list of the screening recommended for you and due dates:  Health Maintenance  Topic Date Due   Hepatitis C Screening: USPSTF Recommendation to screen - Ages 16-79 yo.  Never done   Pneumonia Vaccine (3 - PPSV23 if available, else PCV20) 11/12/2017   COVID-19 Vaccine (6 - Booster) 07/12/2020   Flu Shot  02/25/2021   Tetanus Vaccine  08/07/2021*   Zoster (Shingles) Vaccine (1 of 2) 12/31/2021*   Mammogram  04/18/2022   Colon Cancer Screening  05/31/2023   DEXA scan (bone density measurement)  Completed   HPV Vaccine  Aged Out  *Topic was postponed. The date shown is not the original due date.      Try over the counter estroven complete  for hot flashes   Please try to quit caffeine/diet soda - replace with water  Please try to limit animal product intake   Please request the pharmacy send [place] shingrix dates to update in the system   Ask Dr. Annamaria Boots about recommended pneumonia booster - ? Prevnar 20  Please come get tetanus booster here if any cuts   Know what a healthy weight is for you (roughly BMI <25) and aim to maintain this  Aim for 7+ servings of fruits and vegetables daily  65-80+ fluid ounces of water or unsweet tea for healthy kidneys  Limit to max 1 drink of alcohol per day; avoid smoking/tobacco  Limit animal fats in diet for cholesterol and heart health - choose grass fed whenever available  Avoid highly processed foods, and foods high in saturated/trans fats  Aim for low stress - take time to unwind  and care for your mental health  Aim for 150 min of moderate intensity exercise weekly for heart health, and weights twice weekly for bone health  Aim for 7-9 hours of sleep daily      High-Fiber Eating Plan Fiber, also called dietary fiber, is a type of carbohydrate. It is found foods such as fruits, vegetables, whole grains, and beans. A high-fiber diet can have many health benefits. Your health care provider may recommend a high-fiber diet to help: Prevent constipation. Fiber can make your bowel movements more regular. Lower your cholesterol. Relieve the following conditions: Inflammation of veins in the anus (hemorrhoids). Inflammation of specific areas of the digestive tract (uncomplicated diverticulosis). A problem of the large intestine, also called the colon, that sometimes causes pain and diarrhea (irritable bowel syndrome, or IBS). Prevent overeating as part of a weight-loss plan. Prevent heart disease, type 2 diabetes, and certain cancers. What are tips for following this plan? Reading food labels  Check the nutrition facts label on food products for the amount of dietary fiber. Choose foods that have 5 grams of fiber or more per serving. The goals for recommended daily fiber intake include: Men (age 19 or younger): 34-38 g. Men (over age 47): 28-34 g. Women (age 11  or younger): 25-28 g. Women (over age 50): 22-25 g. Your daily fiber goal is _____________ g. Shopping Choose whole fruits and vegetables instead of processed forms, such as apple juice or applesauce. Choose a wide variety of high-fiber foods such as avocados, lentils, oats, and kidney beans. Read the nutrition facts label of the foods you choose. Be aware of foods with added fiber. These foods often have high sugar and sodium amounts per serving. Cooking Use whole-grain flour for baking and cooking. Cook with brown rice instead of white rice. Meal planning Start the day with a breakfast that is high in  fiber, such as a cereal that contains 5 g of fiber or more per serving. Eat breads and cereals that are made with whole-grain flour instead of refined flour or white flour. Eat brown rice, bulgur wheat, or millet instead of white rice. Use beans in place of meat in soups, salads, and pasta dishes. Be sure that half of the grains you eat each day are whole grains. General information You can get the recommended daily intake of dietary fiber by: Eating a variety of fruits, vegetables, grains, nuts, and beans. Taking a fiber supplement if you are not able to take in enough fiber in your diet. It is better to get fiber through food than from a supplement. Gradually increase how much fiber you consume. If you increase your intake of dietary fiber too quickly, you may have bloating, cramping, or gas. Drink plenty of water to help you digest fiber. Choose high-fiber snacks, such as berries, raw vegetables, nuts, and popcorn. What foods should I eat? Fruits Berries. Pears. Apples. Oranges. Avocado. Prunes and raisins. Dried figs. Vegetables Sweet potatoes. Spinach. Kale. Artichokes. Cabbage. Broccoli. Cauliflower. Green peas. Carrots. Squash. Grains Whole-grain breads. Multigrain cereal. Oats and oatmeal. Brown rice. Barley. Bulgur wheat. [place]. Quinoa. Bran muffins. Popcorn. Rye wafer crackers. Meats and other proteins Navy beans, kidney beans, and pinto beans. Soybeans. Split peas. Lentils. Nuts and seeds. Dairy Fiber-fortified yogurt. Beverages Fiber-fortified soy milk. Fiber-fortified orange juice. Other foods Fiber bars. The items listed above may not be a complete list of recommended foods and beverages. Contact a dietitian for more information. What foods should I avoid? Fruits Fruit juice. Cooked, strained fruit. Vegetables Fried potatoes. Canned vegetables. Well-cooked vegetables. Grains White bread. Pasta made with refined flour. White rice. Meats and other proteins Fatty cuts  of meat. Fried chicken or fried fish. Dairy Milk. Yogurt. Cream cheese. Sour cream. Fats and oils Butters. Beverages Soft drinks. Other foods Cakes and pastries. The items listed above may not be a complete list of foods and beverages to avoid. Talk with your dietitian about what choices are best for you. Summary Fiber is a type of carbohydrate. It is found in foods such as fruits, vegetables, whole grains, and beans. A high-fiber diet has many benefits. It can help to prevent constipation, lower blood cholesterol, aid weight loss, and reduce your risk of heart disease, diabetes, and certain cancers. Increase your intake of fiber gradually. Increasing fiber too quickly may cause cramping, bloating, and gas. Drink plenty of water while you increase the amount of fiber you consume. The best sources of fiber include whole fruits and vegetables, whole grains, nuts, seeds, and beans. This information is not intended to replace advice given to you by your health care provider. Make sure you discuss any questions you have with your health care provider. Document Revised: 11/17/2019 Document Reviewed: 11/17/2019 Elsevier Patient Education  2022 [employer].

## 2021-08-08 LAB — URINALYSIS, ROUTINE W REFLEX MICROSCOPIC
Bacteria, UA: NONE SEEN /HPF
Bilirubin Urine: NEGATIVE
Glucose, UA: NEGATIVE
Hgb urine dipstick: NEGATIVE
Hyaline Cast: NONE SEEN /LPF
Ketones, ur: NEGATIVE
Nitrite: NEGATIVE
Protein, ur: NEGATIVE
Specific Gravity, Urine: 1.019 (ref 1.001–1.035)
pH: <=5 (ref 5.0–8.0)

## 2021-08-08 LAB — CBC WITH DIFFERENTIAL/PLATELET
Absolute Monocytes: 343 cells/uL (ref 200–950)
Basophils Absolute: 62 cells/uL (ref 0–200)
Basophils Relative: 1.4 %
Eosinophils Absolute: 92 cells/uL (ref 15–500)
Eosinophils Relative: 2.1 %
HCT: 45.3 % — ABNORMAL HIGH (ref 35.0–45.0)
Hemoglobin: 14.9 g/dL (ref 11.7–15.5)
Lymphs Abs: 1241 cells/uL (ref 850–3900)
MCH: 29.2 pg (ref 27.0–33.0)
MCHC: 32.9 g/dL (ref 32.0–36.0)
MCV: 88.6 fL (ref 80.0–100.0)
MPV: 10.3 fL (ref 7.5–12.5)
Monocytes Relative: 7.8 %
Neutro Abs: 2662 cells/uL (ref 1500–7800)
Neutrophils Relative %: 60.5 %
Platelets: 200 10*3/uL (ref 140–400)
RBC: 5.11 10*6/uL — ABNORMAL HIGH (ref 3.80–5.10)
RDW: 13.1 % (ref 11.0–15.0)
Total Lymphocyte: 28.2 %
WBC: 4.4 10*3/uL (ref 3.8–10.8)

## 2021-08-08 LAB — HEMOGLOBIN A1C
Hgb A1c MFr Bld: 6.2 % of total Hgb — ABNORMAL HIGH (ref <5.7)
Mean Plasma Glucose: 131 mg/dL
eAG (mmol/L): 7.3 mmol/L

## 2021-08-08 LAB — LIPID PANEL
Cholesterol: 204 mg/dL — ABNORMAL HIGH (ref <200)
HDL: 76 mg/dL (ref >=50)
LDL Cholesterol (Calc): 108 mg/dL (calc) — ABNORMAL HIGH
Non-HDL Cholesterol (Calc): 128 mg/dL (calc) (ref <130)
Total CHOL/HDL Ratio: 2.7 (calc) (ref <5.0)
Triglycerides: 107 mg/dL (ref <150)

## 2021-08-08 LAB — COMPLETE METABOLIC PANEL WITH GFR
AG Ratio: 1.7 (calc) (ref 1.0–2.5)
ALT: 47 U/L — ABNORMAL HIGH (ref 6–29)
AST: 60 U/L — ABNORMAL HIGH (ref 10–35)
Albumin: 4.5 g/dL (ref 3.6–5.1)
Alkaline phosphatase (APISO): 72 U/L (ref 37–153)
BUN: 16 mg/dL (ref 7–25)
CO2: 25 mmol/L (ref 20–32)
Calcium: 10.1 mg/dL (ref 8.6–10.4)
Chloride: 106 mmol/L (ref 98–110)
Creat: 0.88 mg/dL (ref 0.60–1.00)
Globulin: 2.7 g/dL (calc) (ref 1.9–3.7)
Glucose, Bld: 103 mg/dL — ABNORMAL HIGH (ref 65–99)
Potassium: 4.4 mmol/L (ref 3.5–5.3)
Sodium: 140 mmol/L (ref 135–146)
Total Bilirubin: 0.7 mg/dL (ref 0.2–1.2)
Total Protein: 7.2 g/dL (ref 6.1–8.1)
eGFR: 71 mL/min/{1.73_m2} (ref >=60)

## 2021-08-08 LAB — MICROALBUMIN / CREATININE URINE RATIO
Creatinine, Urine: 124 mg/dL (ref 20–275)
Microalb Creat Ratio: 4 mcg/mg creat (ref <30)
Microalb, Ur: 0.5 mg/dL

## 2021-08-08 LAB — VITAMIN D 25 HYDROXY (VIT D DEFICIENCY, FRACTURES): Vit D, 25-Hydroxy: 52 ng/mL (ref 30–100)

## 2021-08-08 LAB — HEPATITIS C ANTIBODY
Hepatitis C Ab: NONREACTIVE
SIGNAL TO CUT-OFF: 0.09 (ref <1.00)

## 2021-08-08 LAB — VITAMIN B12: Vitamin B-12: 422 pg/mL (ref 200–1100)

## 2021-08-08 LAB — TSH: TSH: 1.37 mIU/L (ref 0.40–4.50)

## 2021-08-08 LAB — MAGNESIUM: Magnesium: 2.3 mg/dL (ref 1.5–2.5)

## 2021-08-08 LAB — MICROSCOPIC MESSAGE

## 2021-08-14 ENCOUNTER — Telehealth: Payer: Self-pay

## 2021-08-14 NOTE — Telephone Encounter (Signed)
She has been sick since she got her flu shot. Cough, runny nose, body aches. Does she need to be seen or can you recommend something OTC?

## 2021-08-15 ENCOUNTER — Encounter: Payer: Self-pay | Admitting: Adult Health

## 2021-08-15 ENCOUNTER — Ambulatory Visit (INDEPENDENT_AMBULATORY_CARE_PROVIDER_SITE_OTHER): Payer: Medicare HMO | Admitting: Adult Health

## 2021-08-15 ENCOUNTER — Other Ambulatory Visit: Payer: Self-pay

## 2021-08-15 VITALS — BP 110/72 | HR 82 | Temp 97.9°F | Resp 18 | Ht 64.0 in | Wt 183.2 lb

## 2021-08-15 DIAGNOSIS — J069 Acute upper respiratory infection, unspecified: Secondary | ICD-10-CM

## 2021-08-15 MED ORDER — PROMETHAZINE-DM 6.25-15 MG/5ML PO SYRP: 5.0000 mL | ORAL_SOLUTION | Freq: Four times a day (QID) | ORAL | 1 refills | Status: DC | PRN

## 2021-08-15 MED ORDER — PREDNISONE 20 MG PO TABS: ORAL_TABLET | ORAL | 0 refills | Status: DC

## 2021-08-15 MED ORDER — AMOXICILLIN-POT CLAVULANATE 875-125 MG PO TABS: 1.0000 | ORAL_TABLET | Freq: Two times a day (BID) | ORAL | 0 refills | Status: AC

## 2021-08-15 NOTE — Progress Notes (Signed)
Assessment and Plan:  Tarisa was seen today for acute visit.  Diagnoses and all orders for this visit:  URI with cough and congestion Day 7, is improving over last 1-2 days per patient Fairly benign exam, mostly likely viral etiology She is reassured by this Discussed the importance of avoiding unnecessary antibiotic therapy. Suggested symptomatic OTC remedies. Nasal saline spray for congestion. Nasal steroids, allergy pill, oral steroids offered Salt water gargles, voice rest, sugar free lozenges, zinc lozenges with onset of any URI sx Cough syrup, honey for cough Discussed small risk of secondary bacterial infection if notes sudden worsening in the next 3-5 days with THEN start abx as prescribed Follow up as needed. -     predniSONE (DELTASONE) 20 MG tablet; 2 tablets daily for 3 days, 1 tablet daily for 4 days.  -     promethazine-dextromethorphan (PROMETHAZINE-DM) 6.25-15 MG/5ML syrup; Take 5 mLs by mouth 4 (four) times daily as needed for cough. Other orders -     amoxicillin-clavulanate (AUGMENTIN) 875-125 MG tablet; Take 1 tablet by mouth 2 (two) times daily for 7 days.  Further disposition pending results of labs. Discussed med's effects and SE's.   Over 30 minutes of exam, counseling, chart review, and critical decision making was performed.   Future Appointments  Date Time Provider [place]  10/17/2021  3:00 PM Baird Lyons D, MD [facility] None  12/31/2021  3:30 PM Liane Comber, NP [facility] None  08/07/2022  9:00 AM Liane Comber, NP [facility] None    ------------------------------------------------------------------------------------------------------------------   HPI BP 110/72    Pulse 82    Temp 97.9 F (36.6 C)    Resp 18    Ht 5\' 4"  (1.626 m)    Wt 183 lb 3.2 oz (83.1 kg)    SpO2 96%    BMI 31.45 kg/m  71 y.o.female former smoker, hx of lung cancer and COPD presents for evaluation of peristent URI sx following flu vaccine. She reports tested  negative for covid 19 at home yesterday. Husband also ill. Declined flu screen today after discussion would not change treatment at this point.   She reports sx began 7 days ago, 1-2 days after getting the flu vaccine here. She reports initially procutive cough from throat, still with rhinitis, now dry cough, very sore throat (like razor blades to swallow), has sense of feeling feverish/chills. Hasn't check temp. Chest hurts just with coughing fits. Denies dyspnea. Wiped out but no profound malaise. She does feel sx are slightly improved today.    Past Medical History:  Diagnosis Date   Acute perforated appendicitis 10/31/2014   Adenomatous colon polyp    Allergic rhinitis    Allergy    Anemia    Arthritis    "fingers" (10/31/2014)   Chronic bronchitis ([place])    "got it q yr til I had my nose OR" (10/31/2014)   COPD (chronic obstructive pulmonary disease) ([facility])    Coronary artery disease    DDD (degenerative disc disease), cervical    DDD (degenerative disc disease), lumbar    Depression    "short term when my sister passed away unexpectedly"   Family history of adverse reaction to anesthesia    "mother is hard to wake up; a little goes a long way w/her"   Family history of breast cancer    Family history of colon cancer    Family history of skin cancer    GERD (gastroesophageal reflux disease)    History of stomach ulcers  Hyperlipidemia    "borderline; RX is preventative" (10/31/2014)   Kidney stones    "they passed"   Lung cancer ([place]) 09/28/2018   Migraine    "used to get them alot; don't get them anymore" (10/31/2014)   Osteoarthritis of both knees    Osteopenia    Pneumonia 2003 X 1   Pre-diabetes    Seizures ([place]) ~ 1962 X 1   "from an abscessed wisdom tooth"   Tobacco dependence    Vitamin D deficiency      Allergies  Allergen Reactions   Chantix [Varenicline] Anaphylaxis and Swelling    Makes throat swell up   Moxifloxacin Hcl In Nacl Other (See Comments)    aches     Current Outpatient Medications on File Prior to Visit  Medication Sig   albuterol (PROVENTIL) (2.5 MG/3ML) 0.083% nebulizer solution Take 3 mLs (2.5 mg total) by nebulization every 6 (six) hours as needed for wheezing or shortness of breath.   albuterol (VENTOLIN HFA) 108 (90 Base) MCG/ACT inhaler USE 2 INHALATIONS 25 MINUTES APART EVERY 4 HOURS IF NEEDED TO RESCUE ASTHMA ATTACK   aspirin EC 81 MG tablet Take 81 mg by mouth daily.   Calcium Carbonate Antacid (CALCIUM CARBONATE PO) Take 1,200 mg by mouth daily with breakfast.   cetirizine (ZYRTEC) 10 MG tablet Take 10 mg by mouth daily.   Cholecalciferol (VITAMIN D-3) 125 MCG (5000 UT) TABS Take 2 caps (5,000) units = 10,000 units /daily   diphenhydrAMINE (BENADRYL) 25 MG tablet Take 25 mg by mouth daily.   ezetimibe (ZETIA) 10 MG tablet Take 1 tab daily in the evening for cholesterol.   Ginkgo Biloba 40 MG TABS Take by mouth.   ibuprofen (ADVIL,MOTRIN) 200 MG tablet Take 600 mg by mouth every 8 (eight) hours as needed for headache or moderate pain.    imiquimod (ALDARA) 5 % cream as needed.   metoprolol succinate (TOPROL-XL) 25 MG 24 hr tablet TAKE 1 TABLET BY MOUTH EVERY DAY   pseudoephedrine (SUDAFED) 120 MG 12 hr tablet Take  1 tablet  2 x /day (every 12 hours)   for Head and Chest Congestion   rosuvastatin (CRESTOR) 40 MG tablet TAKE ONE TABLET BY MOUTH DAILY   sertraline (ZOLOFT) 100 MG tablet TAKE 1 TABLET BY MOUTH DAILY FOR MOOD   umeclidinium-vilanterol (ANORO ELLIPTA) 62.5-25 MCG/ACT AEPB Inhale 1 puff into the lungs daily.   zinc gluconate 50 MG tablet Take 50 mg by mouth daily.   No current facility-administered medications on file prior to visit.    ROS: all negative except above.   Physical Exam:  BP 110/72    Pulse 82    Temp 97.9 F (36.6 C)    Resp 18    Ht 5\' 4"  (1.626 m)    Wt 183 lb 3.2 oz (83.1 kg)    SpO2 96%    BMI 31.45 kg/m   General Appearance: Well nourished, in no apparent distress. Eyes: PERRLA, EOMs,  conjunctiva no swelling or erythema Sinuses: No Frontal/maxillary tenderness ENT/Mouth: Ext aud canals clear, TMs without erythema, bulging. No erythema, swelling, or exudate on post pharynx.  Tonsils not swollen or erythematous. Hearing normal. Mildly hoarse vocal quality.  Neck: Supple Respiratory: Respiratory effort normal, BS equal bilaterally without rales, rhonchi, wheezing or stridor.  Cardio: RRR with no MRGs. Brisk peripheral pulses without edema.  Abdomen: Soft, + BS.  Non tender Lymphatics: Non tender without lymphadenopathy.  Musculoskeletal: Full ROM, 5/5 strength, normal gait.  Skin: Warm,  dry without rashes, lesions, ecchymosis.  Neuro: Cranial nerves intact. Normal muscle tone, no cerebellar symptoms. Sensation intact.  Psych: Awake and oriented X 3, normal affect, Insight and Judgment appropriate.     Izora Ribas, NP 3:51 PM [facility]

## 2021-08-15 NOTE — Patient Instructions (Addendum)
°  Start prednisone and cough syrup Voice rest  Salt water gargles Honey with lemon water for throat/cough  If not getting better or suddenly much worse in 2-3 days start augmentin (amox-clav) with food.      HOW TO TREAT VIRAL COUGH AND COLD SYMPTOMS: -Symptoms usually last at least 1 week with the worst symptoms being around day 4. - colds usually start with a sore throat and end with a cough, and the cough can take 2 weeks to get better. -No antibiotics are needed for colds, flu, sore throats, cough, bronchitis UNLESS symptoms are longer than 7 days OR if you are getting better then get drastically worse. -There are a lot of combination medications (Dayquil, Nyquil, Vicks 44, tyelnol cold and sinus, ETC). Please look at the ingredients on the back so that you are treating the correct symptoms and not doubling up on medications/ingredients.  Medicines you can use  Nasal congestion Little Remedies saline spray (aerosol/mist)- can try this, it is in the kids section - pseudoephedrine (Sudafed)- behind the counter, do not use if you have high blood pressure, medicine that have -D in them. - phenylephrine (Sudafed PE) -Dextormethorphan + chlorpheniramine (Coridcidin HBP)- okay if you have high blood pressure -Oxymetazoline (Afrin) nasal spray- LIMIT to 3 days -Saline nasal spray -Neti pot (used distilled or bottled water)  Ear pain/congestion -pseudoephedrine (sudafed) - Nasonex/flonase nasal spray  Fever -Acetaminophen (Tyelnol) -Ibuprofen (Advil, motrin, aleve)  Sore Throat -Acetaminophen (Tyelnol) -Ibuprofen (Advil, motrin, aleve) -Drink a lot of water -Gargle with salt water - Rest your voice (don't talk) -Throat sprays -Cough drops  Body Aches -Acetaminophen (Tyelnol) -Ibuprofen (Advil, motrin, aleve)  Headache -Acetaminophen (Tyelnol) -Ibuprofen (Advil, motrin, aleve) - Exedrin, Exedrin Migraine  Allergy symptoms (cough, sneeze, runny nose, itchy  eyes) -Claritin or loratadine cheapest but likely the weakest -Zyrtec or certizine at night because it can make you sleepy -The strongest is allegra or fexafinadine Cheapest at [employer], [employer], [employer]  Cough -Dextromethorphan (Delsym)- medicine that has DM in it -Guafenesin (Mucinex/Robitussin) - cough drops - drink lots of water  Chest Congestion -Guafenesin (Mucinex/Robitussin)  Red Itchy Eyes - Naphcon-A  Upset Stomach - Bland diet (nothing spicy, greasy, fried, and high acid foods like tomatoes, oranges, berries) -OKAY- cereal, bread, soup, crackers, rice -Eat smaller more frequent meals -reduce caffeine, no alcohol -Loperamide (Imodium-AD) if diarrhea -Prevacid for heart burn  General health when sick -Hydration -wash your hands frequently -keep surfaces clean -change pillow cases and sheets often -Get fresh air but do not exercise strenuously -Vitamin D, double up on it - Vitamin C -Zinc

## 2021-08-19 ENCOUNTER — Encounter: Payer: Self-pay | Admitting: Internal Medicine

## 2021-09-12 ENCOUNTER — Encounter: Payer: Self-pay | Admitting: Internal Medicine

## 2021-10-16 NOTE — Progress Notes (Signed)
HPI ?female smoker followed for allergic rhinitis, recurrent acute bronchitis, tobacco use ?Allergy Profile 06/16/13 Total IgE 41.7   Pos dust mite, dog, [place] grass, ragwee ?Office Spirometry 06/13/2015-within normal limits. FVC 3.55/117%, FEV1 2.64/111%, FEV1/FVC 0.74 ?PFT 10/31/2013-within normal limits with no response to bronchodilator. FVC 3.91/120%, FEV1 3.08/123%, FEV1/FEC 0.79, FEF 25-75% 2.90/129%. TLC 113%, DLCO 86%. ?6 minute walk test 10/31/2013-94%, 97%, 97%, 480 m. Normal oxygenation. ?Office Spirometry 06/13/2015-within normal limits. FVC 3.55/117%, FEV1 2.64/111%, FEV1/FVC 0.74 ?PFT 09/21/2018- minimal obstruction, insig resp to BD. FVC 3.51/ 113%, FEV1 2.69/ 113%, R .77, TLC 118%, DLCO 96% ?---------------------------------------------------------------------------------------------- ? ? ?10/17/20- 71 year old female former (quit 2020) smoker followed for allergic rhinitis, COPD, tobacco use, LUL VATS 09/28/18 for non-SCCa/ Dr Servando Snare, CAD, Aortic Atherosclerosis, GERD, DDD-cervical/lumbar, Obesity ?-Neb albuterol, Ventolin hfa, Singulair,  ?Covid vax-3 Phizer ?Flu vax-had ?-----Pt feeling good overall. Ct scan done 10/04/20 no concerns. ?Recent breast biopsy- benign. ?Feeling very well, encouraged by recent good reports. Hasn't needed to use inhalers. Chest feels clear without cough. Remains off tobacco x 2 years. ?CT chest 10/04/20-  ?IMPRESSION: ?1. Redemonstrated postoperative findings status post left upper ?lobectomy. No evidence of recurrent or metastatic disease in the ?chest. ?2. Background of very fine centrilobular pulmonary nodules, most ?conspicuous in the remaining apical right upper lobe, consistent ?with smoking-related respiratory bronchiolitis. ?3. Hepatic steatosis. ?4. Unchanged focal hyperdense lesion of the caudate measuring 3.0 ?cm, which may again reflect focal fatty sparing or a parenchymal ?liver lesion such as an adenoma or focal nodular hyperplasia. As on ?prior  examination, consider MRI to definitively characterize. ?5. Coronary artery disease. ? Aortic Atherosclerosis (ICD10-I70.0). ? ?10/17/21- 71 year old female former (quit 2020) smoker followed for allergic rhinitis, COPD, tobacco use, LUL VATS 09/28/18 for non-SCCa/ Dr Servando Snare, CAD, Aortic Atherosclerosis, GERD, DDD-cervical/lumbar, Obesity ?-Neb albuterol, Ventolin hfa, Singulair,   Anoro,           Pred taper 08/15/21 ?Covid vax-3 Phizer ?Flu vax-had ?Says Flu shot made her quite sick for several days, now resolved. Otherwise stable without acute event. ?Needs meds refilled. ? ?ROS-see HPI + = positive ?Constitutional:   No-   weight loss, night sweats, fevers, chills, fatigue, lassitude. ?HEENT:   +headaches, difficulty swallowing, tooth/dental problems, sore throat,  ?     sneezing,no- itching, ear ache, +nasal congestion, +post nasal drip,  ?CV:  No-   chest pain, orthopnea, PND, swelling in lower extremities, anasarca,  dizziness, palpitations ?Resp: No-   shortness of breath with exertion or at rest.   ?          productive cough, +non-productive cough,  No- coughing up of blood.   ?           No-   change in color of mucus.  No- wheezing ?Skin: No-   rash or lesions. ?GI:  No-   heartburn, indigestion, abdominal pain, nausea, vomiting,  ?GU:  ?MS:  No-   joint pain or swelling.   ?Neuro-     nothing unusual ?Psych:  No- change in mood or affect. No depression or anxiety.  No memory loss. ? ?OBJ- Physical Exam ?General- Alert, Oriented, Affect-appropriate, Distress- none acute,  ?+ overweight ?Skin- rash-none, lesions- none, excoriation- none ?Lymphadenopathy- none ?Head- atraumatic ?           Eyes- Gross vision intact, PERRLA, conjunctivae and secretions clear ?           Ears- Hearing, canals-normal ?           Nose- Clear,  no-Septal dev, mucus, polyps, erosion, perforation  ?           Throat- Mallampati II , mucosa clear , drainage- none, tonsils- atrophic ?Neck- flexible , trachea midline, no stridor ,  thyroid nl, carotid no bruit ?Chest - symmetrical excursion , unlabored ?          Heart/CV- RRR , no murmur , no gallop  , no rub, nl s1 s2 ?                          - JVD- none , edema- none, stasis changes- none, varices- none ?          Lung- + clear, wheeze -none, cough- none , dullness-none, rub- none ?          Chest wall-  ?Abd-  ?Br/ Gen/ Rectal- Not done, not indicated ?Extrem- cyanosis- none, clubbing, none, atrophy- none, strength- nl ?Neuro- grossly intact to observation ? ? ? ?

## 2021-10-17 ENCOUNTER — Ambulatory Visit: Payer: Medicare HMO | Admitting: Internal Medicine

## 2021-10-17 ENCOUNTER — Other Ambulatory Visit: Payer: Self-pay

## 2021-10-17 ENCOUNTER — Ambulatory Visit (INDEPENDENT_AMBULATORY_CARE_PROVIDER_SITE_OTHER): Payer: Medicare HMO

## 2021-10-17 ENCOUNTER — Encounter: Payer: Self-pay | Admitting: Internal Medicine

## 2021-10-17 VITALS — BP 110/60 | HR 74 | Temp 97.6°F | Ht 64.0 in | Wt 183.2 lb

## 2021-10-17 DIAGNOSIS — C3490 Malignant neoplasm of unspecified part of unspecified bronchus or lung: Secondary | ICD-10-CM

## 2021-10-17 DIAGNOSIS — J449 Chronic obstructive pulmonary disease, unspecified: Secondary | ICD-10-CM | POA: Diagnosis not present

## 2021-10-17 MED ORDER — ALBUTEROL SULFATE HFA 108 (90 BASE) MCG/ACT IN AERS: INHALATION_SPRAY | RESPIRATORY_TRACT | 3 refills | Status: DC

## 2021-10-17 MED ORDER — ANORO ELLIPTA 62.5-25 MCG/ACT IN AEPB: 1.0000 | INHALATION_SPRAY | Freq: Every day | RESPIRATORY_TRACT | 3 refills | Status: DC

## 2021-10-17 MED ORDER — ALBUTEROL SULFATE (2.5 MG/3ML) 0.083% IN NEBU: 2.5000 mg | INHALATION_SOLUTION | Freq: Four times a day (QID) | RESPIRATORY_TRACT | 3 refills | Status: DC | PRN

## 2021-10-17 MED ORDER — ALBUTEROL SULFATE (2.5 MG/3ML) 0.083% IN NEBU: 2.5000 mg | INHALATION_SOLUTION | Freq: Four times a day (QID) | RESPIRATORY_TRACT | 3 refills | Status: AC | PRN

## 2021-10-17 NOTE — Patient Instructions (Signed)
Order- CXR dx COPD mixed type, hx VATS for LUL CA ? ?Scripts sent to [facility] refilling inhalers ?

## 2021-10-22 ENCOUNTER — Encounter: Payer: Self-pay | Admitting: *Deleted

## 2021-11-10 ENCOUNTER — Encounter: Payer: Self-pay | Admitting: Internal Medicine

## 2021-11-10 NOTE — Assessment & Plan Note (Signed)
Stable on current meds. Discussed refills. ?Plan- refill albuterol hfa, ne solution and Anoro.  ?

## 2021-11-10 NOTE — Assessment & Plan Note (Signed)
No recurrence so far ?Plan- update CXR ?

## 2021-11-26 ENCOUNTER — Ambulatory Visit: Payer: Medicare Other | Admitting: Adult Health

## 2021-12-09 DIAGNOSIS — H524 Presbyopia: Secondary | ICD-10-CM | POA: Diagnosis not present

## 2021-12-09 DIAGNOSIS — H5213 Myopia, bilateral: Secondary | ICD-10-CM | POA: Diagnosis not present

## 2021-12-14 ENCOUNTER — Other Ambulatory Visit: Payer: Self-pay | Admitting: Cardiovascular Disease

## 2021-12-31 ENCOUNTER — Ambulatory Visit: Payer: Medicare Other | Admitting: Adult Health

## 2021-12-31 ENCOUNTER — Encounter: Payer: Self-pay | Admitting: Adult Health

## 2021-12-31 IMAGING — MG MM BREAST LOCALIZATION CLIP
4 series · 4 of 12 positions shown · non-contrast
Comparison: Previous exam(s).

CLINICAL DATA: Patient with indeterminate right breast mass.

EXAM:
DIAGNOSTIC RIGHT MAMMOGRAM POST ULTRASOUND BIOPSY

[R CC synth-2D]
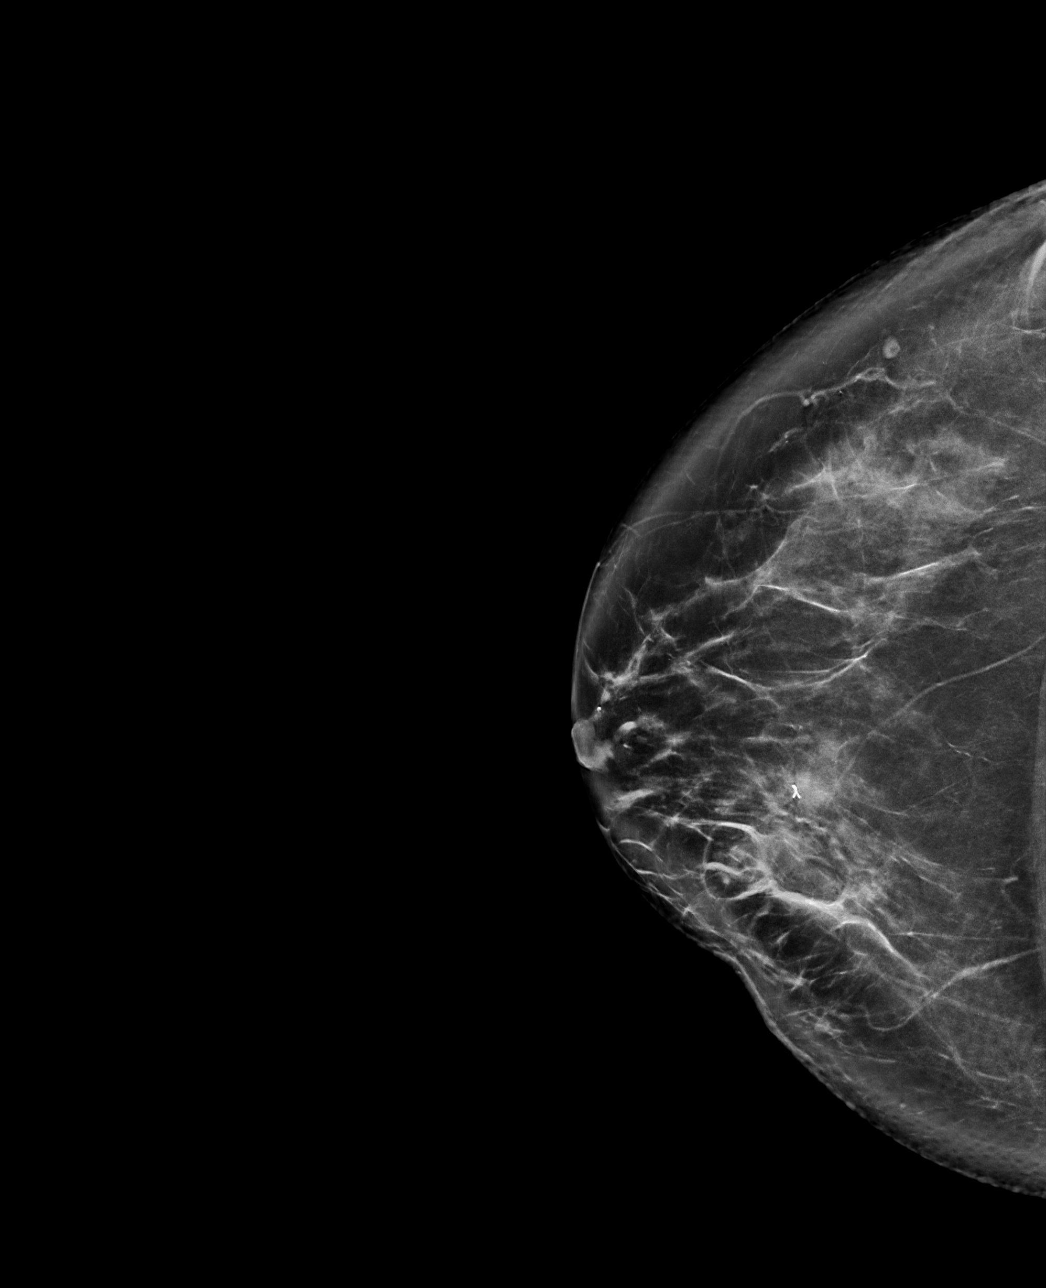

[R ML synth-2D]
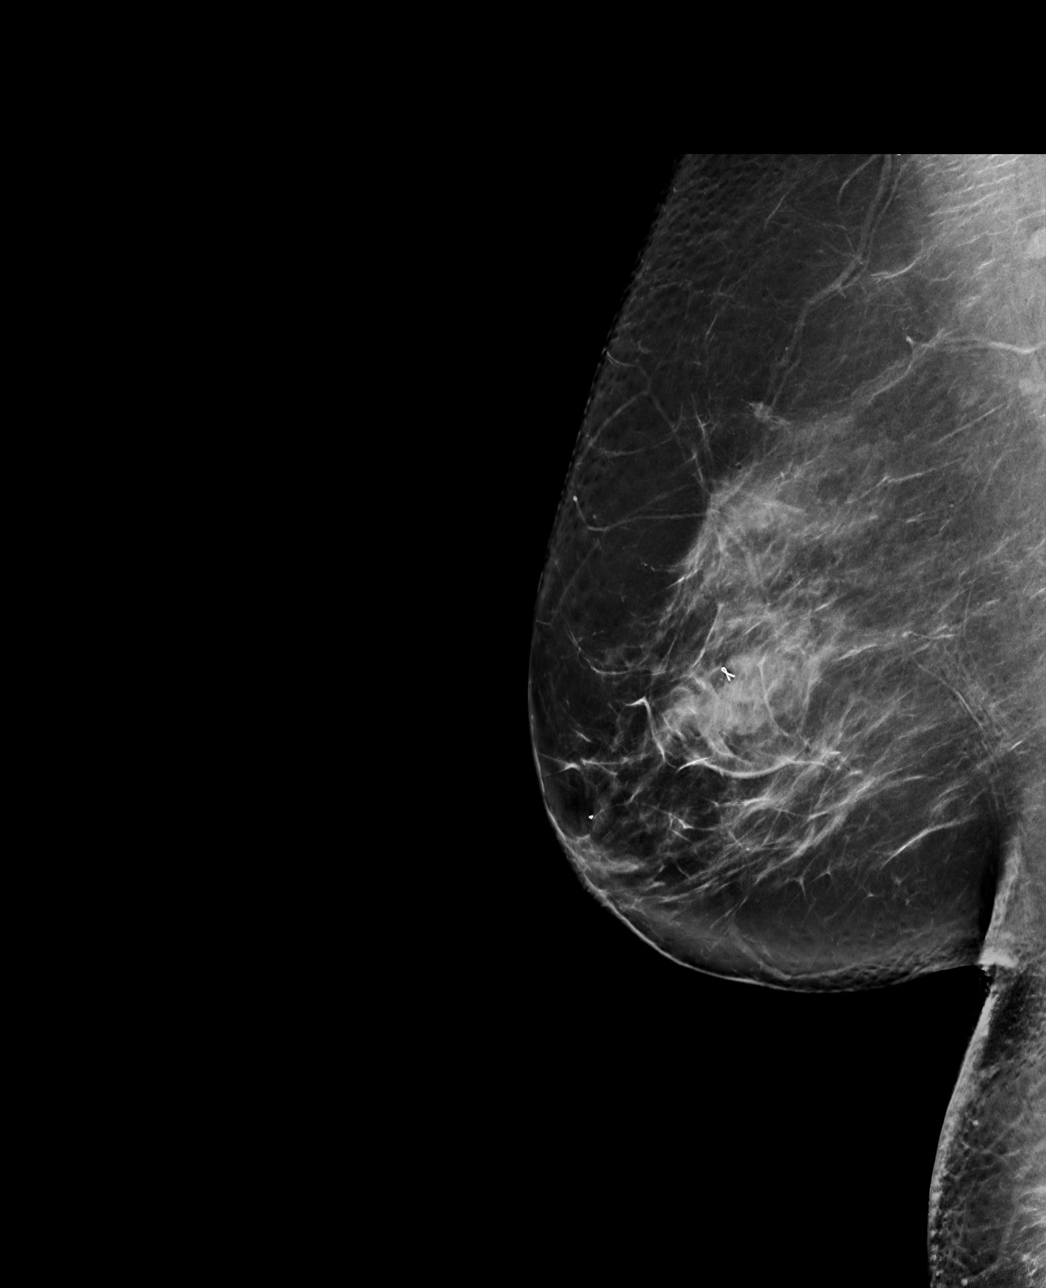

[R ML tomo · tomo slice 47/94.0]
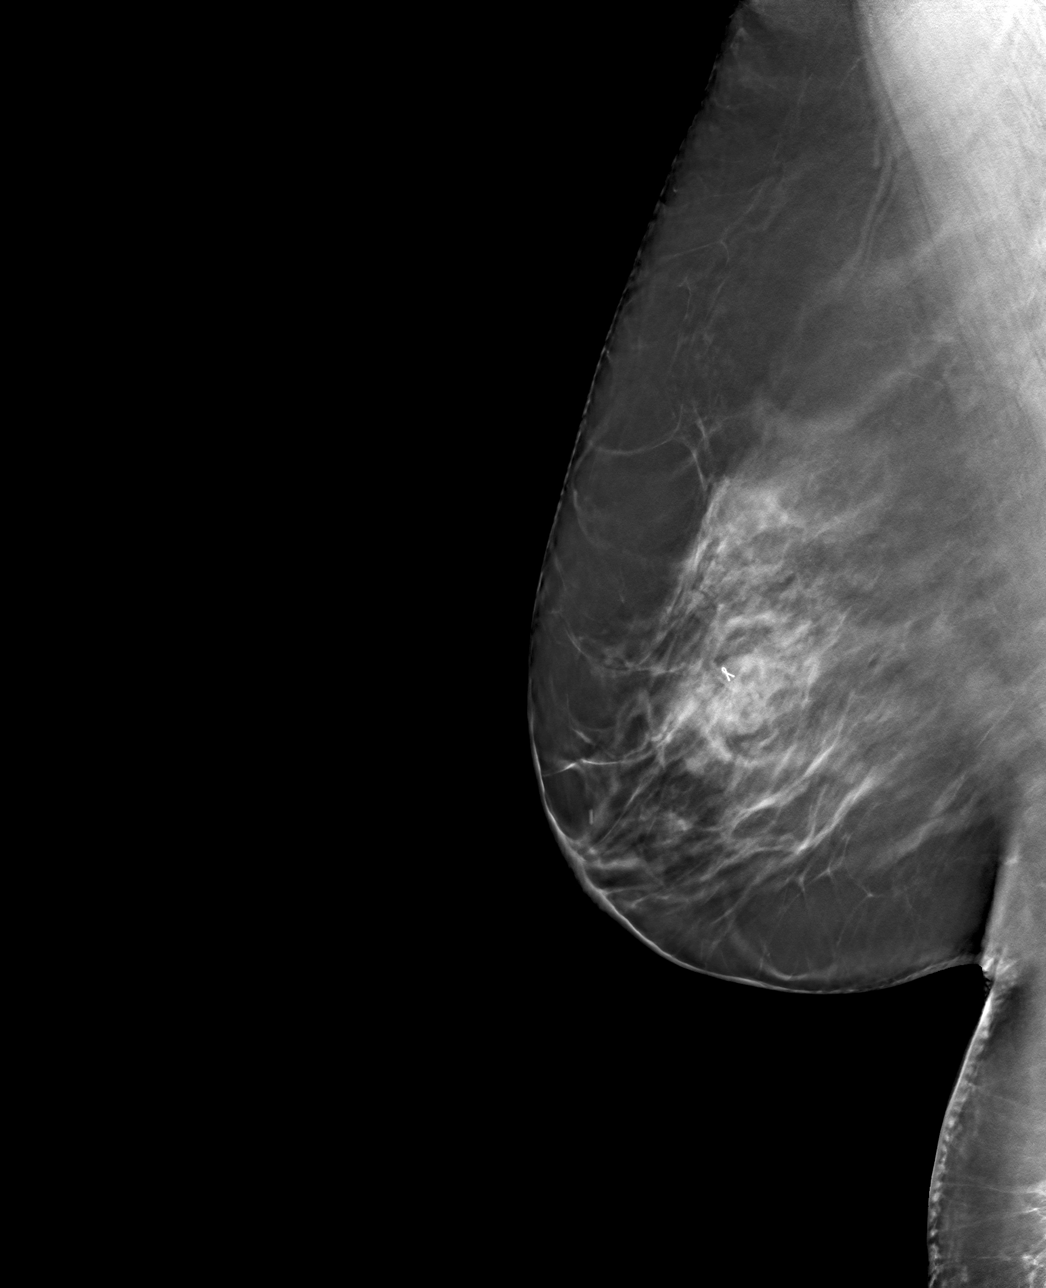

[R CC tomo · tomo slice 45/90.0]
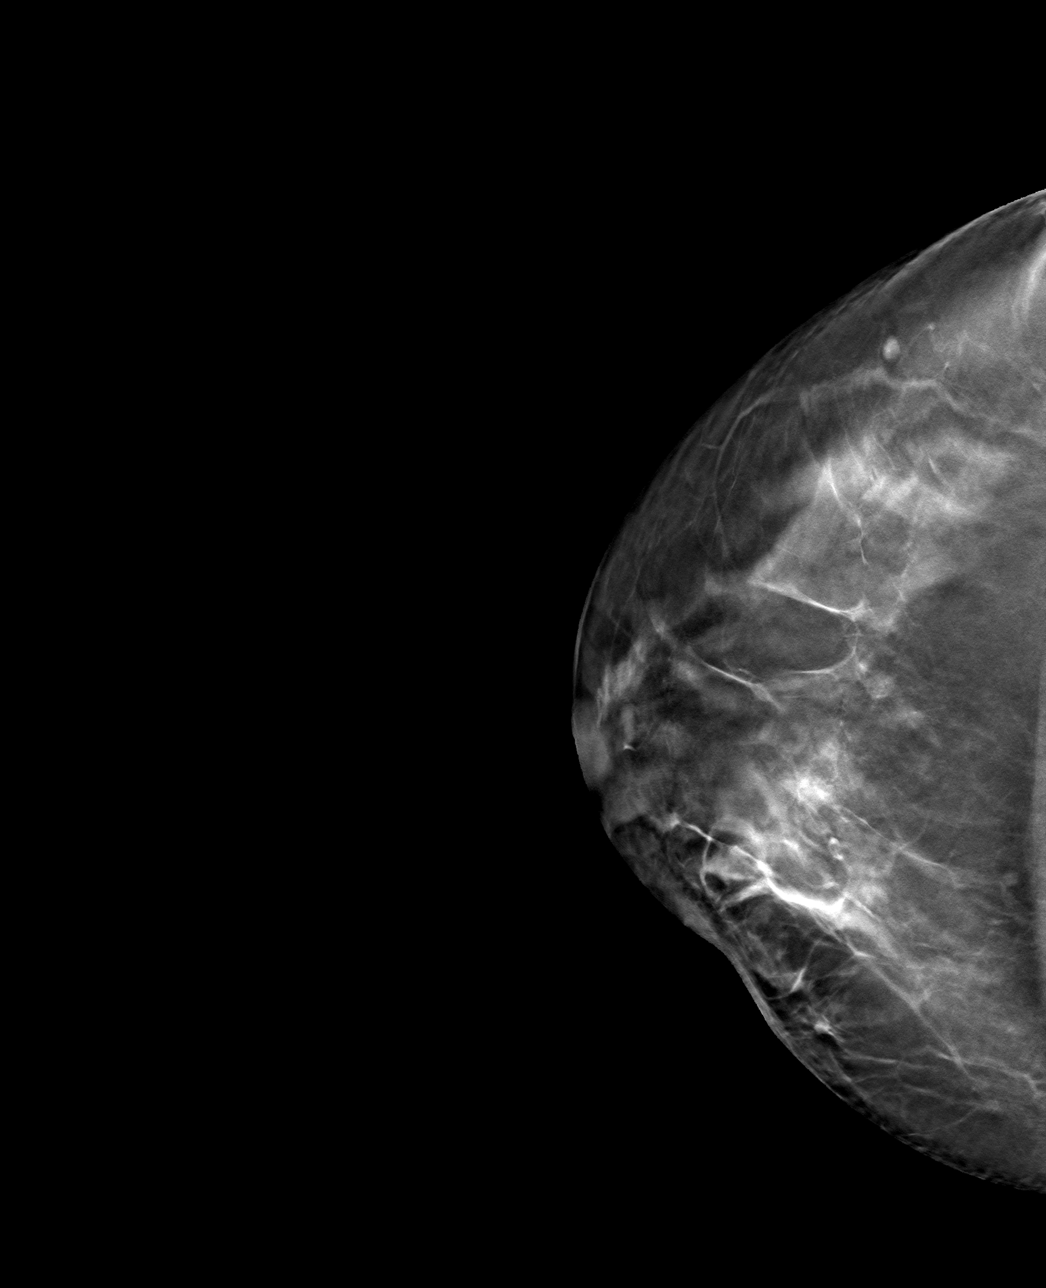

[4 of 12 positions shown; findings below may reference images not displayed]

FINDINGS: Mammographic images were obtained following ultrasound guided biopsy
of right breast mass 1 o'clock position. The biopsy marking clip is
in expected position at the site of biopsy.
IMPRESSION: Appropriate positioning of the ribbon shaped biopsy marking clip at
the site of biopsy in the right breast mass 1 o'clock position.

Final Assessment: Post Procedure Mammograms for Marker Placement

## 2021-12-31 NOTE — Progress Notes (Deleted)
ANNUAL WELLNESS VISIT AND FOLLOW UP   Assessment:    Annual Medicare Wellness Visit Due annually  Health maintenance reviewed  CAD/coronary calcium elevated 400+ Dr. Claiborne Billings following LDL goal <70, titrate medications Control blood pressure, cholesterol, glucose, increase exercise.  Go to the ER if any chest pain, shortness of breath, nausea, dizziness, syncope - lipid panel ***  Agatston coronary artery calcium score greater than 400 Control blood pressure, cholesterol, glucose, increase exercise.  Following with Dr. Claiborne Billings  Aortic atherosclerosis [facility]) - CT 05/2017 Control blood pressure, cholesterol, glucose, increase exercise.   COPD mixed type ([place]) No triggers, pulm following ***  Malignant neoplasm of upper lobe of left lung ([place]) S/p lobectomy, continue follow up with surgeon  Recurrent major depressive disorder, in full remission ([place]) Continue medication Lifestyle discussed: diet/exerise, sleep hygiene, stress management, hydration -     TSH  Mixed hyperlipidemia -     Lipid panel LDL goal <70, not at goal despite rosuvastatin 40 mg, discussed and add zetia 10 mg daily *** check lipids decrease fatty foods increase activity.   Medication management -     CBC with Differential/Platelet -     COMPLETE METABOLIC PANEL WITH GFR -     Magnesium  Obesity - BMI 31 *** - follow up 3 months for progress monitoring - increase veggies, decrease carbs - long discussion about weight loss, diet, and exercise  Fatty liver -     COMPLETE METABOLIC PANEL WITH GFR Weight loss advised, will monitor LFTs  Hot flashes Continue meds, off of estrogen, GYN managing  Hx of adenomatous colonic polyps UTD; increase fiber  Abnormal glucose Discussed disease progression and risks Discussed diet/exercise, weight management and risk modification -     Hemoglobin A1c  B12 deficiency -     Vitamin B12  Vitamin D deficiency Continue supplement  Osteopenia - get dexa  q2y, *** - continue Vit D and Ca supplement if needed, weight bearing exercises  Former smoker (50 pack year, quit 2020) Pulmonology following with CTs  Memory changes Stable; monitor      No orders of the defined types were placed in this encounter.    Over 40 minutes of exam, counseling, chart review and critical decision making was performed Future Appointments  Date Time Provider [place]  12/31/2021  3:30 PM Liane Comber, NP [facility] None  08/07/2022  9:00 AM Liane Comber, NP [facility] None  10/20/2022 10:00 AM Baird Lyons D, MD [facility] None  01/01/2023  4:00 PM Alycia Rossetti, NP [facility] None    Subjective:  Karen Barrera is a 71 y.o. female who presents for 3 month follow up and AWV. She has Seasonal and perennial allergic rhinitis; Depression; Hyperlipidemia; Other abnormal glucose (prediabetes); Vitamin D deficiency; Former smoker; Medication management; Hx of adenomatous colonic polyps; Costochondritis; COPD mixed type ([place]); Hot flashes; Obesity (BMI 30.0-34.9); Genetic testing; Aortic atherosclerosis ([place]) - per CT 05/2017; Fatty liver; Agatston coronary artery calcium score greater than 400; Lung cancer ([place]); Hormone replacement therapy; Arthritis; Gastroesophageal reflux disease; Osteopenia; Primary osteoarthritis of first carpometacarpal joint of left hand; Family history of cancer; VIN III (vulvar intraepithelial neoplasia III); Memory changes; Liver lesion, right lobe; and Essential hypertension on their problem list.    Remote TAH. She is recently following with GYN Dr[facility]ston [facility] and Dr. Denman George for VIN III, planning q38m follow up.  Still has hot flashes, but off of estrogen due to cancer. Gabapentin didn't help. ***  She is a  former smoker, quit March 3rd 2020, 50 pack year hx. She has COPD, s/p left lobectomy for stage 2 non small cell carcinoma on 09/28/2018 with Dr. Servando Snare. Occasional dry cough but has  improved, she uses albuterol to take as needed, didn't see benefit with anoro. Had CT 10/04/2020 showing no evidence of recurrent or metastatic disease ***. Now follows with Dr. Annamaria Boots, doing ongoing imaging through him.   Recurrent lung imaging has showed hepatic steatosis and R lobe lesion, originally noted on CT 10/2014 and characterized as cyst at that time, approx 3 cm, overall fairly stable on serial imaging since that time, felt likely cyst without concerning features. MRI has been suggested if needing to characterize further.  She is on prilosec 40 mg, recently takes PRN, hasn't needed in some time.  BMI is There is no height or weight on file to calculate BMI., she has been working on diet and exercise, trying to lose weight, down from 199 lb, doing fruits and veggies. Very active running after her great grandson. Admits to drinking too much diet Dr. Malachi Bonds. Wants to work on this year.  Wt Readings from Last 3 Encounters:  10/17/21 183 lb 3.2 oz (83.1 kg)  08/15/21 183 lb 3.2 oz (83.1 kg)  08/07/21 183 lb 12.8 oz (83.4 kg)   She follows with Dr. Claiborne Billings for [place]; Ct chest 2018 showed aortic atherosclerosis; Calcium score was significantly increased at 891 placing her in the 97th percentile for age and gender. Cath 08/2018 revealed evidence for coronary calcification with mild nonobstructive CAD for which medical therapy was recommended.  Her blood pressure has been controlled at home, today their BP is   She does not workout, she is cleaning, being active inside and watches great grandson but not doing any working out. .She denies chest pain, shortness of breath, dizziness.   She is on cholesterol medication (rosuvastatin 40 mg daily, zetia 10 mg was added last visit ***)  and denies myalgias. Her cholesterol is not at goal of LDL minimum <70, ideally <50. The cholesterol last visit was:   Lab Results  Component Value Date   CHOL 204 (H) 08/07/2021   HDL 76 08/07/2021   LDLCALC 108 (H)  08/07/2021   TRIG 107 08/07/2021   CHOLHDL 2.7 08/07/2021   She has prediabetes, has been working on diet/exericse. Denies diabetic polys. Last A1C:  Lab Results  Component Value Date   HGBA1C 6.2 (H) 08/07/2021   Last GFR: Lab Results  Component Value Date   EGFR 71 08/07/2021   Patient is on Vitamin D supplement, taking 10000 IU  Lab Results  Component Value Date   VD25OH 25 08/07/2021     She has not started on a b12 supplement;  Lab Results  Component Value Date   PVXYIAXK55 374 08/07/2021      Medication Review:   Current Outpatient Medications (Cardiovascular):    ezetimibe (ZETIA) 10 MG tablet, Take 1 tab daily in the evening for cholesterol.   metoprolol succinate (TOPROL-XL) 25 MG 24 hr tablet, TAKE 1 TABLET BY MOUTH EVERY DAY   rosuvastatin (CRESTOR) 40 MG tablet, TAKE ONE TABLET BY MOUTH DAILY  Current Outpatient Medications (Respiratory):    albuterol (PROVENTIL) (2.5 MG/3ML) 0.083% nebulizer solution, Take 3 mLs (2.5 mg total) by nebulization every 6 (six) hours as needed for wheezing or shortness of breath.   albuterol (VENTOLIN HFA) 108 (90 Base) MCG/ACT inhaler, Inhale 2 puffs every 6 hours if needed   cetirizine (ZYRTEC) 10 MG tablet,  Take 10 mg by mouth daily.   diphenhydrAMINE (BENADRYL) 25 MG tablet, Take 25 mg by mouth daily.   umeclidinium-vilanterol (ANORO ELLIPTA) 62.5-25 MCG/ACT AEPB, Inhale 1 puff into the lungs daily.  Current Outpatient Medications (Analgesics):    aspirin EC 81 MG tablet, Take 81 mg by mouth daily.   ibuprofen (ADVIL,MOTRIN) 200 MG tablet, Take 600 mg by mouth every 8 (eight) hours as needed for headache or moderate pain.    Current Outpatient Medications (Other):    Calcium Carbonate Antacid (CALCIUM CARBONATE PO)*, Take 1,200 mg by mouth daily with breakfast.   Cholecalciferol (VITAMIN D-3) 125 MCG (5000 UT) TABS, Take 2 caps (5,000) units = 10,000 units /daily   Ginkgo Biloba 40 MG TABS, Take by mouth.   imiquimod  (ALDARA) 5 % cream, as needed.   sertraline (ZOLOFT) 100 MG tablet, TAKE 1 TABLET BY MOUTH DAILY FOR MOOD   zinc gluconate 50 MG tablet, Take 50 mg by mouth daily. * These medications belong to multiple therapeutic classes and are listed under each applicable group.   Allergies  Allergen Reactions   Chantix [Varenicline] Anaphylaxis and Swelling    Makes throat swell up   Moxifloxacin Hcl In Nacl Other (See Comments)    aches    Current Problems (verified) Patient Active Problem List   Diagnosis Date Noted   Essential hypertension 04/07/2021   Memory changes 12/31/2020   Liver lesion, right lobe 12/31/2020   VIN III (vulvar intraepithelial neoplasia III) 05/11/2020   Primary osteoarthritis of first carpometacarpal joint of left hand 04/27/2019   Hormone replacement therapy 04/06/2019   Arthritis 04/06/2019   Gastroesophageal reflux disease 04/06/2019   Osteopenia 04/06/2019   Family history of cancer 04/06/2019   Lung cancer ([place]) 09/28/2018   Agatston coronary artery calcium score greater than 400    Fatty liver 08/29/2018   Aortic atherosclerosis ([place]) - per CT 05/2017 08/06/2017   Genetic testing 04/24/2017   Hot flashes 11/12/2016   Obesity (BMI 30.0-34.9) 11/12/2016   COPD mixed type ([place]) 06/05/2016   Costochondritis 03/23/2015   Hx of adenomatous colonic polyps 01/23/2015   Medication management 11/21/2014   Depression    Hyperlipidemia    Other abnormal glucose (prediabetes)    Vitamin D deficiency    Former smoker    Seasonal and perennial allergic rhinitis 07/03/2013    Screening Tests Immunization History  Administered Date(s) Administered   Fluad Quad(high Dose 65+) 08/07/2021   Influenza Split 05/16/2013, 05/12/2014, 05/14/2015   Influenza, High Dose Seasonal PF 05/15/2017, 05/07/2018, 03/29/2019   Influenza, Seasonal, Injecte, Preservative Fre 05/13/2016   Influenza,inj,quad, With Preservative 05/07/2018   Influenza-Unspecified 07/23/2020    PFIZER(Purple Top)SARS-COV-2 Vaccination 10/01/2019, 10/22/2019, 05/17/2020   Pneumococcal Conjugate-13 11/12/2016   Pneumococcal Polysaccharide-23 06/14/2011   Tdap 03/27/2010   Unspecified SARS-COV-2 Vaccination 10/01/2019, 10/22/2019   Zoster, Live 04/14/2012   Health Maintenance  Topic Date Due   Pneumonia Vaccine 34+ Years old (3 - PPSV23 if available, else PCV20) 11/12/2017   TETANUS/TDAP  03/27/2020   COVID-19 Vaccine (6 - Booster) 07/12/2020   Zoster Vaccines- Shingrix (1 of 2) 12/31/2021 (Originally 07/27/1970)   INFLUENZA VACCINE  02/25/2022   MAMMOGRAM  04/10/2023   COLONOSCOPY (Pts 45-72yrs Insurance coverage will need to be confirmed)  05/31/2023   DEXA SCAN  Completed   Hepatitis C Screening  Completed   HPV VACCINES  Aged Out   Tetanus: 2011 due, will get PRN per patient preference Shingrix 2021 reports has gotten both at pharmacy -  need documentation  MGM: 04/09/2021, getting annual screening via GYN DEXA: 03/2020 At GYN Osteopenia, GYN managing PAP getting by GYN, vulvar lesion is monitored annually  Colonoscopy: 05/2020 due in 3 years, 5 polyps, Dr. Carlean Purl  Names of Other Physician/Practitioners you currently use: 1. [facility] here for primary care 2. Dr. Dorena Cookey, eye doctor, last visit 2022 3. Dr. Luretha Rued, dentist 2022 4. Dr. Marland Kitchen derm, last visit 04/2021 for total body check  Patient Care Team: [name], MD as PCP - General (Internal Medicine) Troy Sine, MD as PCP - Cardiology (Cardiology) Izora Gala, MD as Consulting Physician (Otolaryngology) Delila Pereyra, MD as Consulting Physician (Gynecology) Gatha Mayer, MD as Consulting Physician (Gastroenterology) Maple Hudson, MD as Referring Physician (Surgery)  SURGICAL HISTORY She  has a past surgical history that includes Shoulder arthroscopy w/ rotator cuff repair (Left, 2012); Tendon repair (Left, 10/2009); Shoulder arthroscopy (Right, 2003);  Salpingoophorectomy (Right, 1999); Sinus exploration (12/2013); Vaginal hysterectomy (1978); laparoscopic appendectomy (N/A, 10/31/2014); Appendectomy; Colonoscopy; Cyst excision (Right, 01/11/2016); Arthrotomy (Right, 01/11/2016); LEFT HEART CATH AND CORONARY ANGIOGRAPHY (N/A, 09/06/2018); Video bronchoscopy (N/A, 09/28/2018); Video assisted thoracoscopy (vats)/wedge resection (Left, 09/28/2018); Ganglion cyst excision (Right, 02/21/2020); Polypectomy; and Breast lumpectomy with radioactive seed localization (Right, 08/01/2020). FAMILY HISTORY Her family history includes Alzheimer's disease in her father; Arthritis in her paternal grandmother; Cancer in an other family member; Colon cancer in an other family member; Colon cancer (age of onset: 6) in some other family members; Colon polyps in her father; Diabetes in her mother; Epilepsy in her sister; Hyperlipidemia in her maternal grandfather; Melanoma (age of onset: 52) in her paternal grandmother; Skin cancer (age of onset: 73) in her mother; Stroke in her maternal grandmother. SOCIAL HISTORY She  reports that she quit smoking about 3 years ago. Her smoking use included cigarettes. She has a 50.00 pack-year smoking history. She has never used smokeless tobacco. She reports that she does not drink alcohol and does not use drugs.  MEDICARE WELLNESS OBJECTIVES: Physical activity:   Cardiac risk factors:   Depression/mood screen:      08/07/2021    9:35 AM  Depression screen PHQ 2/9  Decreased Interest 0  Down, Depressed, Hopeless 0  PHQ - 2 Score 0  Altered sleeping 0  Tired, decreased energy 0  Change in appetite 0  Feeling bad or failure about yourself  0  Trouble concentrating 0  Moving slowly or fidgety/restless 0  Suicidal thoughts 0  PHQ-9 Score 0  Difficult doing work/chores Not difficult at all    ADLs:     04/08/2021   10:42 AM  In your present state of health, do you have any difficulty performing the following activities:  Hearing? 0   Vision? 0  Difficulty concentrating or making decisions? 0  Walking or climbing stairs? 0  Dressing or bathing? 0  Doing errands, shopping? 0     Cognitive Testing  Alert? Yes  Normal Appearance?Yes  Oriented to person? Yes  Place? Yes   Time? Yes  Recall of three objects?  Yes  Can perform simple calculations? Yes  Displays appropriate judgment?Yes  Can read the correct time from a watch face?Yes  EOL planning:      *** Review of Systems  Constitutional:  Positive for diaphoresis (hot flashes, off of estrogen, GYN follows). Negative for malaise/fatigue and weight loss.  HENT:  Positive for congestion. Negative for ear pain, hearing loss, sinus pain and tinnitus.  Chronically decreased smell and taste, has seen ENT  Eyes: Negative.  Negative for blurred vision and double vision.  Respiratory:  Negative for cough, hemoptysis, sputum production, shortness of breath and wheezing.   Cardiovascular: Negative.  Negative for chest pain, palpitations, orthopnea, claudication and leg swelling.  Gastrointestinal:  Negative for abdominal pain, blood in stool, constipation, diarrhea, heartburn, melena, nausea and vomiting.  Genitourinary: Negative.   Musculoskeletal:  Positive for joint pain (mild, intermittent, bil knees, hands, ortho follows). Negative for back pain, falls, myalgias and neck pain.  Skin: Negative.  Negative for rash.  Neurological: Negative.  Negative for dizziness, tingling, sensory change, weakness and headaches.  Endo/Heme/Allergies:  Positive for environmental allergies. Negative for polydipsia.  Psychiatric/Behavioral:  Positive for memory loss (more difficulty with navigation, good short term recall ). Negative for depression, hallucinations, substance abuse and suicidal ideas. The patient is not nervous/anxious and does not have insomnia.   All other systems reviewed and are negative.   Objective:     There were no vitals filed for this visit.  There is  no height or weight on file to calculate BMI.  General appearance: alert, no distress, WD/WN, female HEENT: normocephalic, sclerae anicteric, TMs pearly, nares patent, no discharge or erythema, pharynx normal, no sinus tenderness Oral cavity: MMM, no lesions Neck: supple, no lymphadenopathy, no thyromegaly, no masses Heart: RRR, normal S1, S2, no murmur  Lungs: CTA bilaterally, no wheezes, rhonchi, or rales Abdomen: +bs, soft, non-tender, non distended, no masses, no hepatomegaly, no splenomegaly Musculoskeletal: nontender, no swelling, no obvious deformity Extremities: no edema, no cyanosis, no clubbing Pulses: 2+ symmetric, upper and lower extremities, normal cap refill Neurological: alert, oriented x 3, CN2-12 intact, strength normal upper extremities and lower extremities, sensation normal throughout, DTRs 2+ throughout, no cerebellar signs, gait normal Psychiatric: normal affect, behavior normal, pleasant     Medicare Attestation I have personally reviewed: The patient's medical and social history Their use of alcohol, tobacco or illicit drugs Their current medications and supplements The patient's functional ability including ADLs,fall risks, home safety risks, cognitive, and hearing and visual impairment Diet and physical activities Evidence for depression or mood disorders  The patient's weight, height, BMI, and visual acuity have been recorded in the chart.  I have made referrals, counseling, and provided education to the patient based on review of the above and I have provided the patient with a written personalized care plan for preventive services.      Izora Ribas, NP   12/31/2021

## 2022-01-01 IMAGING — CT CT CHEST W/O CM
2 of 4 series · 11 of 36 positions shown, 13 images · non-contrast
Comparison: none

CLINICAL DATA: October 20, 2019

[Series 2: chest 2.00 br40 s3 · axial · 0.61mm/px · z∈[+1683,+1923]mm · 8 of 142 slices shown, 10 images (1 of 2)]
[im 11/142  mediastinal]
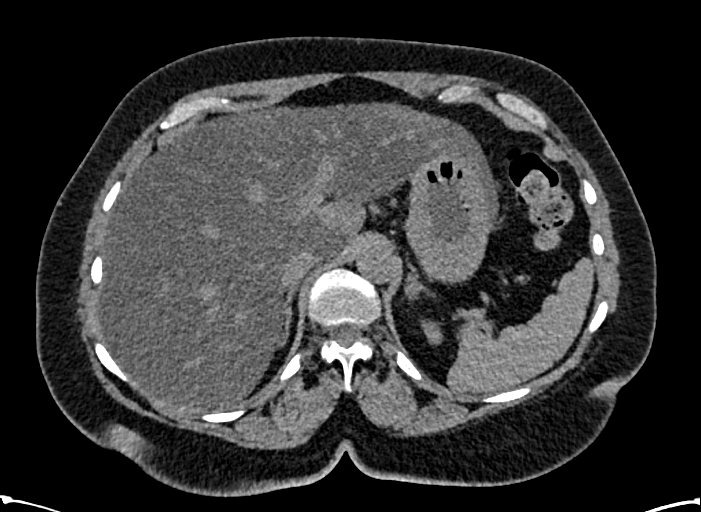
[im 11/142  lung]
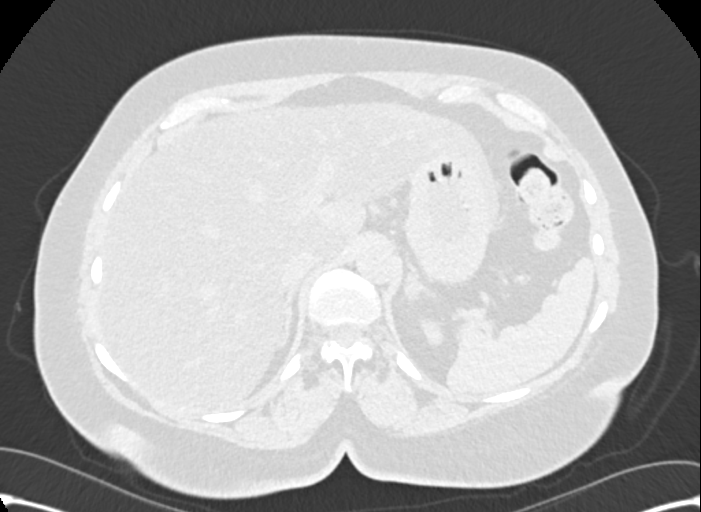
[im 33/142  lung]
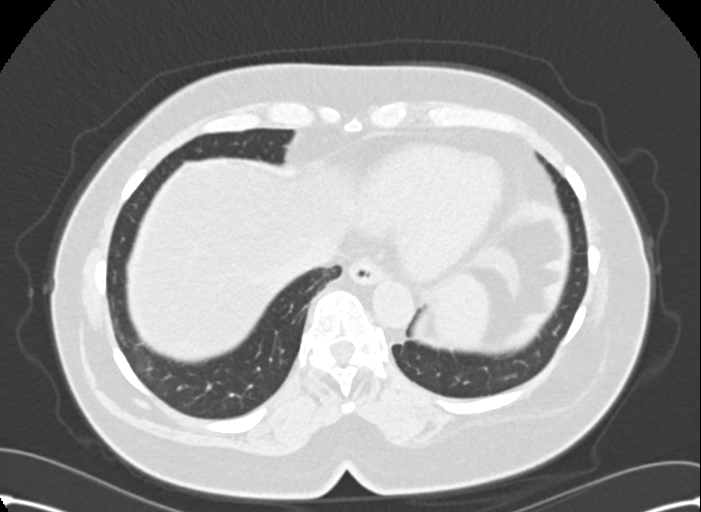
[im 44/142  lung]
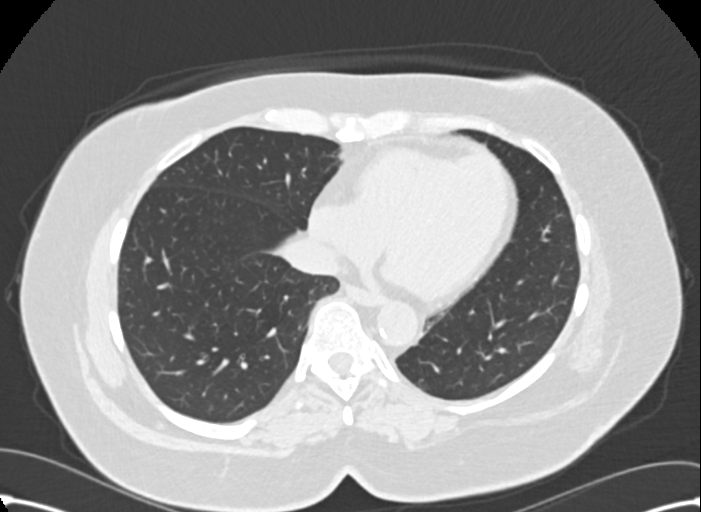
[im 66/142  lung]
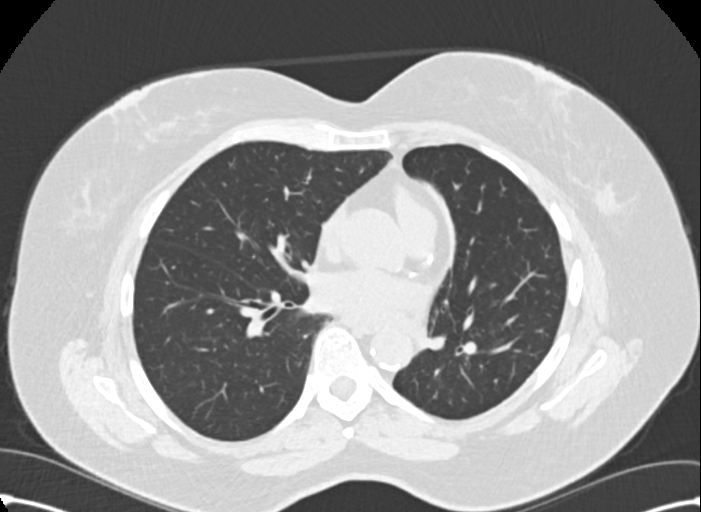
[im 76/142  mediastinal]
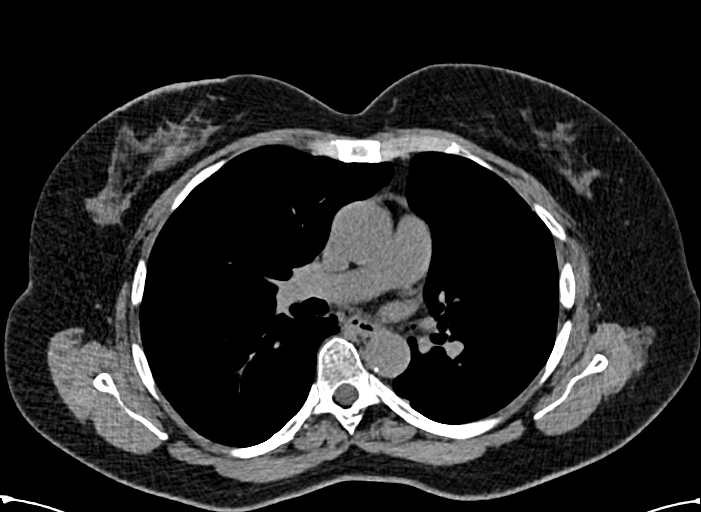
[im 76/142  lung]
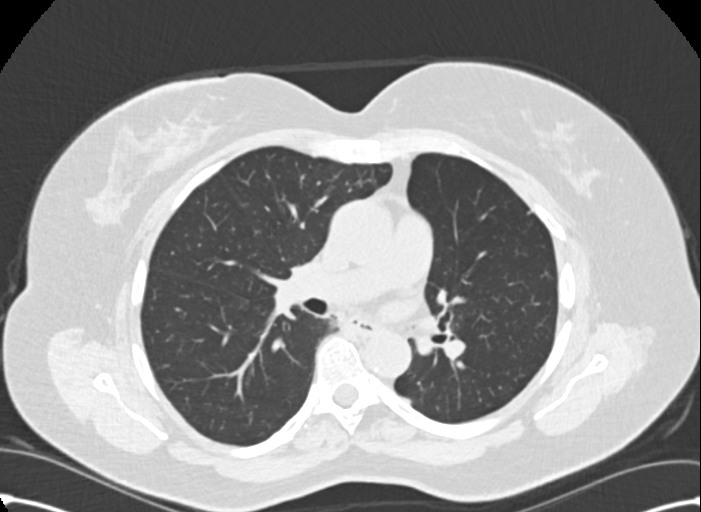
[im 98/142  lung]
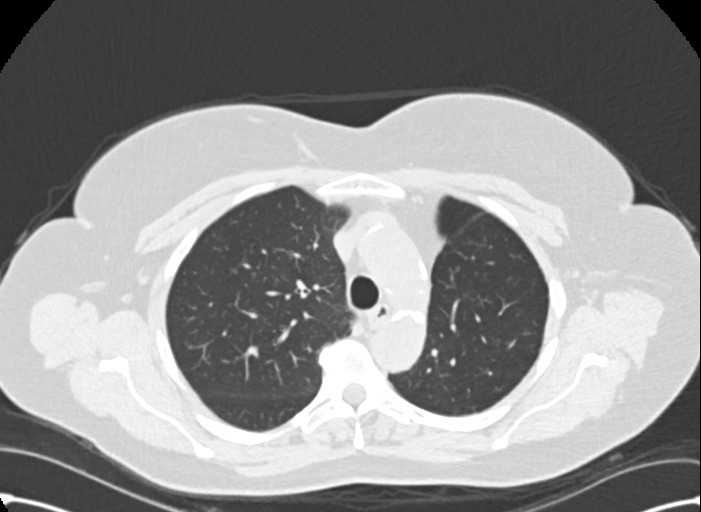
[im 109/142  lung]
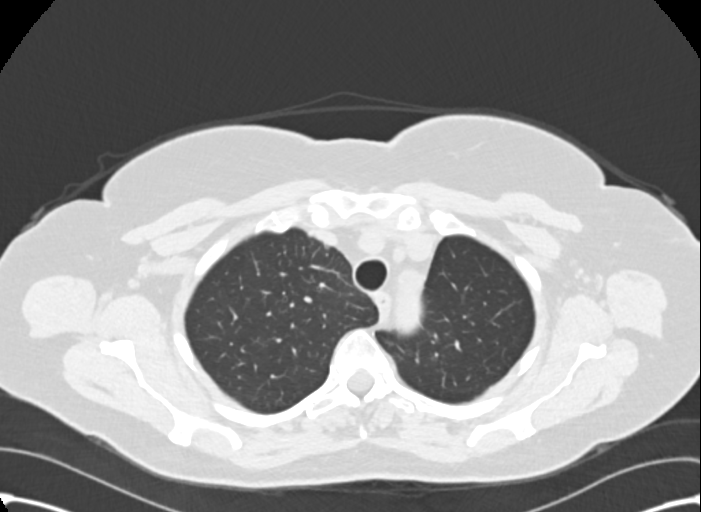
[im 131/142  lung]
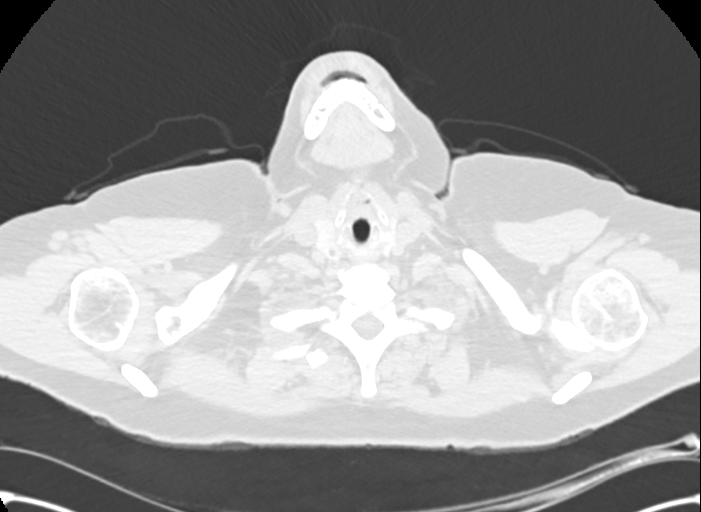

[Series 4: chest 2.00 br40 s3 · coronal · 0.56mm/px · 3 of 154 slices shown (2 of 2)]
[im 31/154  lung]
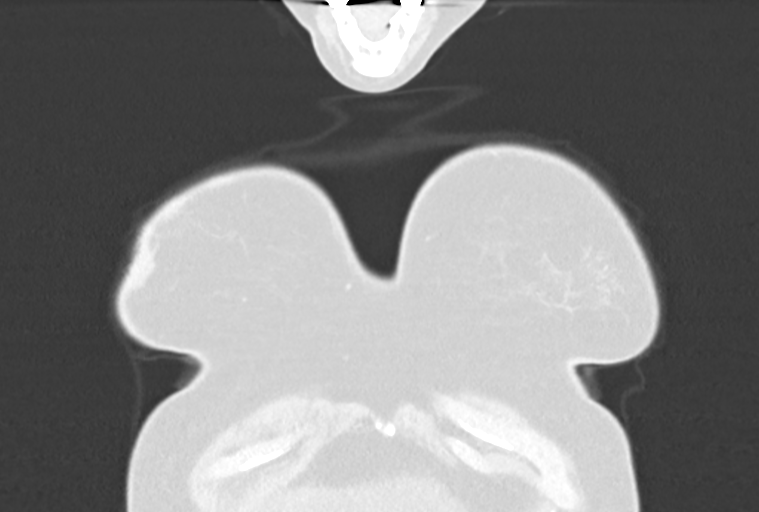
[im 62/154  lung]
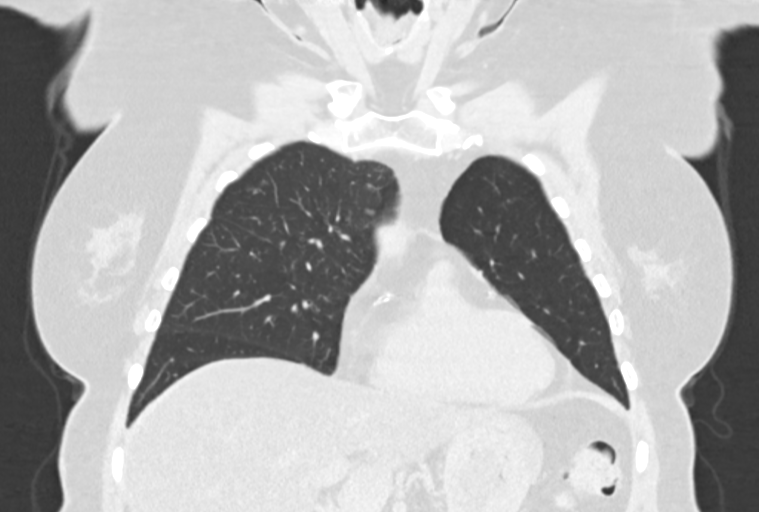
[im 92/154  lung]
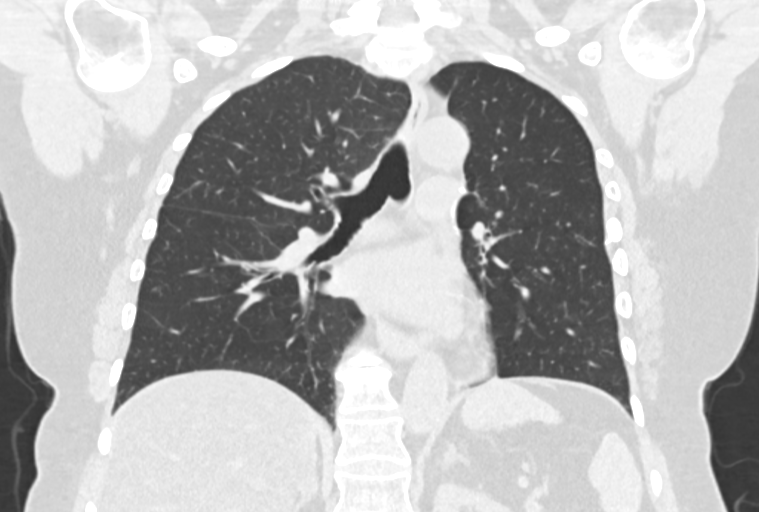

[11 of 36 positions shown; findings below may reference images not displayed]

FINDINGS: Cardiovascular: Calcified atheromatous plaque of the thoracic aorta.
No aneurysmal dilation. Central pulmonary vasculature unchanged with
respect caliber. No pericardial effusion. Three-vessel coronary
artery disease as before. Limited assessment of cardiovascular
structures given lack of intravenous contrast.

Mediastinum/Nodes: No adenopathy in the chest. Esophagus grossly
normal.

Lungs/Pleura: Post LEFT upper lobectomy with bandlike scarring in
the LEFT lower lobe extending anteriorly towards the anterior LEFT
upper chest. Small nodular focus along these bandlike changes is
unchanged approximately 4 mm (image 50, series 8) no effusion. No
acute consolidative changes or airway abnormality.

Upper Abdomen: Severe hepatic steatosis. Stable low-density lesion
along the posterior RIGHT hemi liver compatible with a cyst is
unchanged dating back to 2576 rounded area in the caudate lobe
measuring 2.7 x 1.9 cm with variable attenuation with respect to the
liver parenchyma was present as far back is 1818 but showed no FDG
uptake. Adrenal thickening on the LEFT is unchanged and did not show
FDG uptake on previous PET imaging. No acute upper abdominal
process.

Musculoskeletal: No acute musculoskeletal process. Some sclerosis at
T1 posteriorly is unchanged mild sternal sclerosis of the sternal
body in the upper margin of the sternum is also not changed dating
back to Saturday July, 2018 showing no increased metabolic activity on
prior PET.
IMPRESSION: 1. Post LEFT upper lobectomy with bandlike scarring and associated
small nodule in the LEFT lower lobe extending anteriorly towards the
anterior LEFT upper chest. Findings are not changed.
2. Severe hepatic steatosis. Area of variable attenuation in the
caudate lobe is been present over multiple prior studies and may
represent variable fat deposition/focal fatty sparing and did not
show FDG uptake on the August 31, 2018 PET exam. Given more focal
appearance and slightly atypical location may consider MRI follow-up
for further assessment to exclude small lesions such as hepatic
adenoma.
3. Areas of sclerosis in C6 and C7 as well as posterior aspect of T1
and the sternum not changed over time. Continued attention on
follow-up is suggested.
4. Three-vessel coronary artery disease as before.
5. Aortic atherosclerosis.

Aortic Atherosclerosis ([id number]).

## 2022-01-09 ENCOUNTER — Ambulatory Visit: Payer: Medicare HMO | Admitting: Adult Health

## 2022-01-09 DIAGNOSIS — I10 Essential (primary) hypertension: Secondary | ICD-10-CM

## 2022-01-09 NOTE — Progress Notes (Deleted)
ANNUAL WELLNESS VISIT AND FOLLOW UP   Assessment:    Annual Medicare Wellness Visit Due annually  Health maintenance reviewed  CAD/coronary calcium elevated 400+ Dr. Claiborne Billings following LDL goal <70, titrate medications Control blood pressure, cholesterol, glucose, increase exercise.  Go to the ER if any chest pain, shortness of breath, nausea, dizziness, syncope - lipid panel ***  Agatston coronary artery calcium score greater than 400 Control blood pressure, cholesterol, glucose, increase exercise.  Following with Dr. Claiborne Billings  Aortic atherosclerosis [facility]) - CT 05/2017 Control blood pressure, cholesterol, glucose, increase exercise.   COPD mixed type ([place]) No triggers, pulm following ***  Malignant neoplasm of upper lobe of left lung ([place]) S/p lobectomy, continue follow up with surgeon  Recurrent major depressive disorder, in full remission ([place]) Continue medication Lifestyle discussed: diet/exerise, sleep hygiene, stress management, hydration -     TSH  Mixed hyperlipidemia -     Lipid panel LDL goal <70, not at goal despite rosuvastatin 40 mg, discussed and add zetia 10 mg daily *** check lipids decrease fatty foods increase activity.   Medication management -     CBC with Differential/Platelet -     COMPLETE METABOLIC PANEL WITH GFR -     Magnesium  Obesity - BMI 31 *** - follow up 3 months for progress monitoring - increase veggies, decrease carbs - long discussion about weight loss, diet, and exercise  Fatty liver -     COMPLETE METABOLIC PANEL WITH GFR Weight loss advised, will monitor LFTs  Hot flashes Continue meds, off of estrogen, GYN managing  Hx of adenomatous colonic polyps UTD; increase fiber  Abnormal glucose Discussed disease progression and risks Discussed diet/exercise, weight management and risk modification -     Hemoglobin A1c  B12 deficiency -     Vitamin B12  Vitamin D deficiency Continue supplement  Osteopenia - get dexa  q2y, *** - continue Vit D and Ca supplement if needed, weight bearing exercises  Former smoker (50 pack year, quit 2020) Pulmonology following with CTs  Memory changes Stable; monitor      No orders of the defined types were placed in this encounter.    Over 40 minutes of exam, counseling, chart review and critical decision making was performed Future Appointments  Date Time Provider [place]  01/09/2022  2:00 PM Liane Comber, NP [facility] None  08/07/2022  9:00 AM Darrol Jump, NP [facility] None  10/20/2022 10:00 AM Baird Lyons D, MD [facility] None  01/01/2023  4:00 PM Alycia Rossetti, NP [facility] None    Subjective:  Karen Barrera is a 71 y.o. female who presents for 3 month follow up and AWV. She has Seasonal and perennial allergic rhinitis; Depression; Hyperlipidemia; Other abnormal glucose (prediabetes); Vitamin D deficiency; Former smoker; Medication management; Hx of adenomatous colonic polyps; Costochondritis; COPD mixed type ([place]); Hot flashes; Obesity (BMI 30.0-34.9); Genetic testing; Aortic atherosclerosis ([place]) - per CT 05/2017; Fatty liver; Agatston coronary artery calcium score greater than 400; Lung cancer ([place]); Hormone replacement therapy; Arthritis; Gastroesophageal reflux disease; Osteopenia; Primary osteoarthritis of first carpometacarpal joint of left hand; Family history of cancer; VIN III (vulvar intraepithelial neoplasia III); Memory changes; Liver lesion, right lobe; and Essential hypertension on their problem list.    Remote TAH. She is recently following with GYN Dr[facility]ston [facility] and Dr. Denman George for VIN III, planning q8mfollow up.  Still has hot flashes, but off of estrogen due to cancer. Gabapentin didn't help. ***  She is a  former smoker, quit March 3rd 2020, 50 pack year hx. She has COPD, s/p left lobectomy for stage 2 non small cell carcinoma on 09/28/2018 with Dr. Servando Snare. Occasional dry cough but has  improved, she uses albuterol to take as needed, didn't see benefit with anoro. Had CT 10/04/2020 showing no evidence of recurrent or metastatic disease ***. Now follows with Dr. Annamaria Boots, doing ongoing imaging through him.   Recurrent lung imaging has showed hepatic steatosis and R lobe lesion, originally noted on CT 10/2014 and characterized as cyst at that time, approx 3 cm, overall fairly stable on serial imaging since that time, felt likely cyst without concerning features. MRI has been suggested if needing to characterize further.  She is on prilosec 40 mg, recently takes PRN, hasn't needed in some time.  BMI is There is no height or weight on file to calculate BMI., she has been working on diet and exercise, trying to lose weight, down from 199 lb, doing fruits and veggies. Very active running after her great grandson. Admits to drinking too much diet Dr. Malachi Bonds. Wants to work on this year.  Wt Readings from Last 3 Encounters:  10/17/21 183 lb 3.2 oz (83.1 kg)  08/15/21 183 lb 3.2 oz (83.1 kg)  08/07/21 183 lb 12.8 oz (83.4 kg)   She follows with Dr. Claiborne Billings for [place]; Ct chest 2018 showed aortic atherosclerosis; Calcium score was significantly increased at 891 placing her in the 97th percentile for age and gender. Cath 08/2018 revealed evidence for coronary calcification with mild nonobstructive CAD for which medical therapy was recommended.  Her blood pressure has been controlled at home, today their BP is   She does not workout, she is cleaning, being active inside and watches great grandson but not doing any working out. .She denies chest pain, shortness of breath, dizziness.   She is on cholesterol medication (rosuvastatin 40 mg daily, zetia 10 mg was added last visit ***)  and denies myalgias. Her cholesterol is not at goal of LDL minimum <70, ideally <50. The cholesterol last visit was:   Lab Results  Component Value Date   CHOL 204 (H) 08/07/2021   HDL 76 08/07/2021   LDLCALC 108 (H)  08/07/2021   TRIG 107 08/07/2021   CHOLHDL 2.7 08/07/2021   She has prediabetes, has been working on diet/exericse. Denies diabetic polys. Last A1C:  Lab Results  Component Value Date   HGBA1C 6.2 (H) 08/07/2021   Last GFR: Lab Results  Component Value Date   EGFR 71 08/07/2021   Patient is on Vitamin D supplement, taking 10000 IU  Lab Results  Component Value Date   VD25OH 25 08/07/2021     She has not started on a b12 supplement;  Lab Results  Component Value Date   PVXYIAXK55 374 08/07/2021      Medication Review:   Current Outpatient Medications (Cardiovascular):    ezetimibe (ZETIA) 10 MG tablet, Take 1 tab daily in the evening for cholesterol.   metoprolol succinate (TOPROL-XL) 25 MG 24 hr tablet, TAKE 1 TABLET BY MOUTH EVERY DAY   rosuvastatin (CRESTOR) 40 MG tablet, TAKE ONE TABLET BY MOUTH DAILY  Current Outpatient Medications (Respiratory):    albuterol (PROVENTIL) (2.5 MG/3ML) 0.083% nebulizer solution, Take 3 mLs (2.5 mg total) by nebulization every 6 (six) hours as needed for wheezing or shortness of breath.   albuterol (VENTOLIN HFA) 108 (90 Base) MCG/ACT inhaler, Inhale 2 puffs every 6 hours if needed   cetirizine (ZYRTEC) 10 MG tablet,  Take 10 mg by mouth daily.   diphenhydrAMINE (BENADRYL) 25 MG tablet, Take 25 mg by mouth daily.   umeclidinium-vilanterol (ANORO ELLIPTA) 62.5-25 MCG/ACT AEPB, Inhale 1 puff into the lungs daily.  Current Outpatient Medications (Analgesics):    aspirin EC 81 MG tablet, Take 81 mg by mouth daily.   ibuprofen (ADVIL,MOTRIN) 200 MG tablet, Take 600 mg by mouth every 8 (eight) hours as needed for headache or moderate pain.    Current Outpatient Medications (Other):    Calcium Carbonate Antacid (CALCIUM CARBONATE PO)*, Take 1,200 mg by mouth daily with breakfast.   Cholecalciferol (VITAMIN D-3) 125 MCG (5000 UT) TABS, Take 2 caps (5,000) units = 10,000 units /daily   Ginkgo Biloba 40 MG TABS, Take by mouth.   imiquimod  (ALDARA) 5 % cream, as needed.   sertraline (ZOLOFT) 100 MG tablet, TAKE 1 TABLET BY MOUTH DAILY FOR MOOD   zinc gluconate 50 MG tablet, Take 50 mg by mouth daily. * These medications belong to multiple therapeutic classes and are listed under each applicable group.   Allergies  Allergen Reactions   Chantix [Varenicline] Anaphylaxis and Swelling    Makes throat swell up   Moxifloxacin Hcl In Nacl Other (See Comments)    aches    Current Problems (verified) Patient Active Problem List   Diagnosis Date Noted   Essential hypertension 04/07/2021   Memory changes 12/31/2020   Liver lesion, right lobe 12/31/2020   VIN III (vulvar intraepithelial neoplasia III) 05/11/2020   Primary osteoarthritis of first carpometacarpal joint of left hand 04/27/2019   Hormone replacement therapy 04/06/2019   Arthritis 04/06/2019   Gastroesophageal reflux disease 04/06/2019   Osteopenia 04/06/2019   Family history of cancer 04/06/2019   Lung cancer ([place]) 09/28/2018   Agatston coronary artery calcium score greater than 400    Fatty liver 08/29/2018   Aortic atherosclerosis ([place]) - per CT 05/2017 08/06/2017   Genetic testing 04/24/2017   Hot flashes 11/12/2016   Obesity (BMI 30.0-34.9) 11/12/2016   COPD mixed type ([place]) 06/05/2016   Costochondritis 03/23/2015   Hx of adenomatous colonic polyps 01/23/2015   Medication management 11/21/2014   Depression    Hyperlipidemia    Other abnormal glucose (prediabetes)    Vitamin D deficiency    Former smoker    Seasonal and perennial allergic rhinitis 07/03/2013    Screening Tests Immunization History  Administered Date(s) Administered   Fluad Quad(high Dose 65+) 08/07/2021   Influenza Split 05/16/2013, 05/12/2014, 05/14/2015   Influenza, High Dose Seasonal PF 05/15/2017, 05/07/2018, 03/29/2019   Influenza, Seasonal, Injecte, Preservative Fre 05/13/2016   Influenza,inj,quad, With Preservative 05/07/2018   Influenza-Unspecified 07/23/2020    PFIZER(Purple Top)SARS-COV-2 Vaccination 10/01/2019, 10/22/2019, 05/17/2020   Pneumococcal Conjugate-13 11/12/2016   Pneumococcal Polysaccharide-23 06/14/2011   Tdap 03/27/2010   Unspecified SARS-COV-2 Vaccination 10/01/2019, 10/22/2019   Zoster, Live 04/14/2012   Health Maintenance  Topic Date Due   Zoster Vaccines- Shingrix (1 of 2) Never done   Pneumonia Vaccine 29+ Years old (3 - PPSV23 if available, else PCV20) 11/12/2017   TETANUS/TDAP  03/27/2020   COVID-19 Vaccine (6 - Booster) 07/12/2020   INFLUENZA VACCINE  02/25/2022   MAMMOGRAM  04/10/2023   COLONOSCOPY (Pts 45-18yr Insurance coverage will need to be confirmed)  05/31/2023   DEXA SCAN  Completed   Hepatitis C Screening  Completed   HPV VACCINES  Aged Out   Tetanus: 2011 due, will get PRN per patient preference Shingrix 2021 reports has gotten both at pharmacy -  need documentation  MGM: 04/09/2021, getting annual screening via GYN DEXA: 03/2020 At GYN Osteopenia, GYN managing PAP getting by GYN, vulvar lesion is monitored annually  Colonoscopy: 05/2020 due in 3 years, 5 polyps, Dr. Carlean Purl  Names of Other Physician/Practitioners you currently use: 1. [facility] here for primary care 2. Dr. Dorena Cookey, eye doctor, last visit 2022 3. Dr. Luretha Rued, dentist 2022 4. Dr. Marland Kitchen derm, last visit 04/2021 for total body check  Patient Care Team: [name], MD as PCP - General (Internal Medicine) Troy Sine, MD as PCP - Cardiology (Cardiology) Izora Gala, MD as Consulting Physician (Otolaryngology) Delila Pereyra, MD as Consulting Physician (Gynecology) Gatha Mayer, MD as Consulting Physician (Gastroenterology) Maple Hudson, MD as Referring Physician (Surgery)  SURGICAL HISTORY She  has a past surgical history that includes Shoulder arthroscopy w/ rotator cuff repair (Left, 2012); Tendon repair (Left, 10/2009); Shoulder arthroscopy (Right, 2003); Salpingoophorectomy  (Right, 1999); Sinus exploration (12/2013); Vaginal hysterectomy (1978); laparoscopic appendectomy (N/A, 10/31/2014); Appendectomy; Colonoscopy; Cyst excision (Right, 01/11/2016); Arthrotomy (Right, 01/11/2016); LEFT HEART CATH AND CORONARY ANGIOGRAPHY (N/A, 09/06/2018); Video bronchoscopy (N/A, 09/28/2018); Video assisted thoracoscopy (vats)/wedge resection (Left, 09/28/2018); Ganglion cyst excision (Right, 02/21/2020); Polypectomy; and Breast lumpectomy with radioactive seed localization (Right, 08/01/2020). FAMILY HISTORY Her family history includes Alzheimer's disease in her father; Arthritis in her paternal grandmother; Cancer in an other family member; Colon cancer in an other family member; Colon cancer (age of onset: 56) in some other family members; Colon polyps in her father; Diabetes in her mother; Epilepsy in her sister; Hyperlipidemia in her maternal grandfather; Melanoma (age of onset: 36) in her paternal grandmother; Skin cancer (age of onset: 24) in her mother; Stroke in her maternal grandmother. SOCIAL HISTORY She  reports that she quit smoking about 3 years ago. Her smoking use included cigarettes. She has a 50.00 pack-year smoking history. She has never used smokeless tobacco. She reports that she does not drink alcohol and does not use drugs.  MEDICARE WELLNESS OBJECTIVES: Physical activity:   Cardiac risk factors:   Depression/mood screen:      08/07/2021    9:35 AM  Depression screen PHQ 2/9  Decreased Interest 0  Down, Depressed, Hopeless 0  PHQ - 2 Score 0  Altered sleeping 0  Tired, decreased energy 0  Change in appetite 0  Feeling bad or failure about yourself  0  Trouble concentrating 0  Moving slowly or fidgety/restless 0  Suicidal thoughts 0  PHQ-9 Score 0  Difficult doing work/chores Not difficult at all    ADLs:     04/08/2021   10:42 AM  In your present state of health, do you have any difficulty performing the following activities:  Hearing? 0  Vision? 0   Difficulty concentrating or making decisions? 0  Walking or climbing stairs? 0  Dressing or bathing? 0  Doing errands, shopping? 0     Cognitive Testing  Alert? Yes  Normal Appearance?Yes  Oriented to person? Yes  Place? Yes   Time? Yes  Recall of three objects?  Yes  Can perform simple calculations? Yes  Displays appropriate judgment?Yes  Can read the correct time from a watch face?Yes  EOL planning:      *** Review of Systems  Constitutional:  Positive for diaphoresis (hot flashes, off of estrogen, GYN follows). Negative for malaise/fatigue and weight loss.  HENT:  Positive for congestion. Negative for ear pain, hearing loss, sinus pain and tinnitus.  Chronically decreased smell and taste, has seen ENT  Eyes: Negative.  Negative for blurred vision and double vision.  Respiratory:  Negative for cough, hemoptysis, sputum production, shortness of breath and wheezing.   Cardiovascular: Negative.  Negative for chest pain, palpitations, orthopnea, claudication and leg swelling.  Gastrointestinal:  Negative for abdominal pain, blood in stool, constipation, diarrhea, heartburn, melena, nausea and vomiting.  Genitourinary: Negative.   Musculoskeletal:  Positive for joint pain (mild, intermittent, bil knees, hands, ortho follows). Negative for back pain, falls, myalgias and neck pain.  Skin: Negative.  Negative for rash.  Neurological: Negative.  Negative for dizziness, tingling, sensory change, weakness and headaches.  Endo/Heme/Allergies:  Positive for environmental allergies. Negative for polydipsia.  Psychiatric/Behavioral:  Positive for memory loss (more difficulty with navigation, good short term recall ). Negative for depression, hallucinations, substance abuse and suicidal ideas. The patient is not nervous/anxious and does not have insomnia.   All other systems reviewed and are negative.    Objective:     There were no vitals filed for this visit.  There is no height  or weight on file to calculate BMI.  General appearance: alert, no distress, WD/WN, female HEENT: normocephalic, sclerae anicteric, TMs pearly, nares patent, no discharge or erythema, pharynx normal, no sinus tenderness Oral cavity: MMM, no lesions Neck: supple, no lymphadenopathy, no thyromegaly, no masses Heart: RRR, normal S1, S2, no murmur  Lungs: CTA bilaterally, no wheezes, rhonchi, or rales Abdomen: +bs, soft, non-tender, non distended, no masses, no hepatomegaly, no splenomegaly Musculoskeletal: nontender, no swelling, no obvious deformity Extremities: no edema, no cyanosis, no clubbing Pulses: 2+ symmetric, upper and lower extremities, normal cap refill Neurological: alert, oriented x 3, CN2-12 intact, strength normal upper extremities and lower extremities, sensation normal throughout, DTRs 2+ throughout, no cerebellar signs, gait normal Psychiatric: normal affect, behavior normal, pleasant     Medicare Attestation I have personally reviewed: The patient's medical and social history Their use of alcohol, tobacco or illicit drugs Their current medications and supplements The patient's functional ability including ADLs,fall risks, home safety risks, cognitive, and hearing and visual impairment Diet and physical activities Evidence for depression or mood disorders  The patient's weight, height, BMI, and visual acuity have been recorded in the chart.  I have made referrals, counseling, and provided education to the patient based on review of the above and I have provided the patient with a written personalized care plan for preventive services.      Izora Ribas, NP   01/09/2022

## 2022-02-04 ENCOUNTER — Ambulatory Visit (INDEPENDENT_AMBULATORY_CARE_PROVIDER_SITE_OTHER): Payer: Medicare HMO | Admitting: Nurse Practitioner

## 2022-02-04 VITALS — BP 122/78 | HR 63 | Temp 96.8°F | Wt 178.2 lb

## 2022-02-04 DIAGNOSIS — E669 Obesity, unspecified: Secondary | ICD-10-CM

## 2022-02-04 DIAGNOSIS — M8580 Other specified disorders of bone density and structure, unspecified site: Secondary | ICD-10-CM

## 2022-02-04 DIAGNOSIS — I10 Essential (primary) hypertension: Secondary | ICD-10-CM

## 2022-02-04 DIAGNOSIS — I700 Atherosclerosis of aorta: Secondary | ICD-10-CM | POA: Diagnosis not present

## 2022-02-04 DIAGNOSIS — E66811 Obesity, class 1: Secondary | ICD-10-CM

## 2022-02-04 DIAGNOSIS — C3412 Malignant neoplasm of upper lobe, left bronchus or lung: Secondary | ICD-10-CM

## 2022-02-04 DIAGNOSIS — M1990 Unspecified osteoarthritis, unspecified site: Secondary | ICD-10-CM

## 2022-02-04 DIAGNOSIS — Z86010 Personal history of colonic polyps: Secondary | ICD-10-CM

## 2022-02-04 DIAGNOSIS — Z0000 Encounter for general adult medical examination without abnormal findings: Secondary | ICD-10-CM

## 2022-02-04 DIAGNOSIS — R7309 Other abnormal glucose: Secondary | ICD-10-CM

## 2022-02-04 DIAGNOSIS — J302 Other seasonal allergic rhinitis: Secondary | ICD-10-CM

## 2022-02-04 DIAGNOSIS — K219 Gastro-esophageal reflux disease without esophagitis: Secondary | ICD-10-CM

## 2022-02-04 DIAGNOSIS — E782 Mixed hyperlipidemia: Secondary | ICD-10-CM

## 2022-02-04 DIAGNOSIS — K760 Fatty (change of) liver, not elsewhere classified: Secondary | ICD-10-CM

## 2022-02-04 DIAGNOSIS — K769 Liver disease, unspecified: Secondary | ICD-10-CM

## 2022-02-04 DIAGNOSIS — Z860101 Personal history of adenomatous and serrated colon polyps: Secondary | ICD-10-CM

## 2022-02-04 DIAGNOSIS — J449 Chronic obstructive pulmonary disease, unspecified: Secondary | ICD-10-CM

## 2022-02-04 DIAGNOSIS — F3342 Major depressive disorder, recurrent, in full remission: Secondary | ICD-10-CM

## 2022-02-04 NOTE — Patient Instructions (Signed)
Eating Plan for Brain Health A healthy diet is an important part of overall wellness, and it may help to improve brain function. Eating a healthy diet can lower the risk for Alzheimer's disease and dementia. It also may slow the progression of those diseases. Depending on your overall health and any conditions you have, you may need to follow certain dietary guidelines. Work with your health care provider or nutrition specialist (dietitian) to create an eating plan that is right for you. What are tips for following this plan? Reading food labels Check the Nutrition Facts on food labels for the Daily Value (DV) percentages of nutrients in one serving of food. DVs are based on the recommended amounts of nutrients to eat, or not to exceed, each day. Aim for a DV of 5% or less per serving for: Saturated fats. Trans fats. Cholesterol. Salt (sodium). Choose whole grains instead of processed grains such as white flour, white bread, and white rice. "Whole grain" or "whole wheat" should be among the first items in the ingredients list. Shopping Before you go grocery shopping, plan your meals and make a shopping list. Look for lean meats, fish, low-fat dairy products, fruits and vegetables, and whole grains. Avoid buying processed or prepared foods. These are higher in added sugar, fat, and sodium. Cooking Use healthy oils, such as olive oil, instead of butter or margarine. Avoid frying foods. Healthier ways of cooking include roasting, baking, poaching, and steaming. Many healthy foods can be prepared without cooking, such as canned tuna, nuts, beans, vegetables, and fruits. Meal planning Plan to eat one or more servings of each of these foods every day: Green leafy vegetables. Nuts. Whole grains. Plan to eat berries, beans, fish, and poultry two or more times every week. Avoid red meats, butter, cheese, sweets, and fried foods. Eat 6 smaller meals throughout the day rather than 3 full  meals. General tips If you have difficulty chewing or swallowing: Choose foods that are tender, soft, and moist. Avoid foods that are sticky, hard, dry, chewy, or crunchy. Cut food into pieces that are smaller than your thumbnail. If you are underweight: Add extra protein or calories to meals by adding cream, nut butters, or protein powder to foods and drinks. Drink nutritional supplement shakes as told by your health care provider or dietitian. Drink enough fluid to keep your urine pale yellow. If you drink alcohol: Limit how much you use to: 0-1 drink a day for women who are not pregnant. 0-2 drinks a day for men. Be aware of how much alcohol is in your drink. In the [place] one drink equals one 12 oz bottle of beer (355 mL), one 5 oz glass of wine (148 mL), or one 1 oz glass of hard liquor (44 mL). Take daily vitamin and mineral supplements as told by your health care provider or dietitian. Follow daily calorie and nutrient intake goals as told by your dietitian. What foods are recommended?  Foods that are high in omega-3s (omega-3 fatty acids). Omega-3s are often found in coldwater fish. They are also found in ground flaxseed, walnuts, edamame, and seaweed. It is recommended that adults eat at least 8 oz of fish or other seafood every week. Wild salmon, albacore, tuna, sardines, and farmed trout are among the best sources for omega-3s. Bake or grill fish instead of frying it to avoid unhealthy fats. Foods that contain vitamins C, D, and E. These include: Avocados. Beans. Nuts and seeds. Green leafy vegetables, like spinach and kale. Cruciferous vegetables,  like broccoli or Brussels sprouts. Cherries and berries, especially blackberries and blueberries. Berries have antioxidants and nutrients that support memory function. Whole grains, such as oatmeal, whole-grain cereal, whole-grain bread, brown rice, and barley. Herbs and spices. Cooking with certain herbs and spices, such as  turmeric, can help you absorb vitamins. Foods in the [place] diet, the DASH (Dietary Approaches to Stop Hypertension) diet, or a combination of foods in both plans. These diets recommend that you: Eat green leafy vegetables, nuts, and whole grains every day. Drink one glass of wine a day if appropriate. Eat berries, beans, fish, and poultry two or more times every week. Limit red meats, butter, cheese, sweets, fried foods, and fast food to once a week or less. Healthy breakfast foods, such as whole-grain cereal, low-fat yogurt, berries or vegetables, and nuts. Eating breakfast every day can boost brain function and concentration for people of all ages. What foods are not recommended? Foods that are high in trans fats, including: Fried foods. Snack foods such as potato chips. Pastries like cookies and donuts, especially those that come prepackaged. Foods that are high in saturated fats, including: Processed, precooked, or cured meat, such as sausages or meat loaves. Red meat. Certain dairy products like butter, margarine, and some cheeses. Processed grains, such as white bread. Sweets and fast food. Limit these types of food to once a week or less. Summary Eating a nutritious diet can support brain health as well as overall wellness. Choose foods that are high in omega-3 fatty acids, vitamins, and whole grains. Avoid foods that contain trans fats and saturated fats. Limit sweets and fast food. This information is not intended to replace advice given to you by your health care provider. Make sure you discuss any questions you have with your health care provider. Document Revised: 10/12/2019 Document Reviewed: 10/12/2019 Elsevier Patient Education  [place].

## 2022-02-04 NOTE — Progress Notes (Signed)
ANNUAL MEDICARE VISIT AND 3 MONTH   Assessment:   1. Encounter for Medicare annual wellness exam Due Annually  2. Aortic atherosclerosis ([place]) - per CT 05/2017 Discussed lifestyle modifications. Recommended diet heavy in fruits and veggies, omega 3's. Decrease consumption of animal meats, cheeses, and dairy products. Remain active and exercise as tolerated. Pulmonology following. Continue to monitor.  - COMPLETE METABOLIC PANEL WITH GFR - Lipid panel  3. Essential hypertension Discussed DASH (Dietary Approaches to Stop Hypertension) DASH diet is lower in sodium than a typical American diet. Cut back on foods that are high in saturated fat, cholesterol, and trans fats. Eat more whole-grain foods, fish, poultry, and nuts Remain active and exercise as tolerated daily.  Monitor BP at home-Call if greater than 130/80.   - CBC with Differential/Platelet - COMPLETE METABOLIC PANEL WITH GFR  4. COPD mixed type ([place]) Continue inhalers  Avoid triggers   5. Seasonal and perennial allergic rhinitis Continue antihistamine. Avoid triggers.  6. Malignant neoplasm of upper lobe of left lung ([place]) S/p lobectomy, continue follow up with surgeon  7. Other abnormal glucose (prediabetes) Education: Reviewed 'ABCs' of diabetes management  A1C (<7) Blood pressure (<130/80) Cholesterol (LDL <70) Continue Eye Exam yearly  Continue Dental Exam Q6 mo Discussed dietary recommendations Discussed Physical Activity recommendations Foot exam UTD  - Hemoglobin A1c - Urinalysis, Routine w reflex microscopic  8. Fatty liver Weight loss advised, will monitor LFTs Discussed lifestyle modifications.  - COMPLETE METABOLIC PANEL WITH GFR  9. Gastroesophageal reflux disease, unspecified whether esophagitis present No suspected reflux complications ([place]/stricture). Lifestyle modification:  wt loss, avoid meals 2-3h before bedtime. Consider eliminating food triggers:  chocolate, caffeine,  EtOH, acid/spicy food.    10. Liver lesion, right lobe Continue to monitor  - COMPLETE METABOLIC PANEL WITH GFR  11. Arthritis RICE Method when flared. OTC analgesic PRN as directed  12. Osteopenia, unspecified location RICE Method when flared. OTC analgesic PRN as directed  13. Recurrent major depressive disorder, in full remission ([place]) ***  14. Mixed hyperlipidemia *** - Lipid panel  15. Obesity (BMI 30.0-34.9) *** - COMPLETE METABOLIC PANEL WITH GFR - Lipid panel - Hemoglobin A1c  16. Hx of adenomatous colonic polyps ***    No orders of the defined types were placed in this encounter.    Over 40 minutes of exam, counseling, chart review and critical decision making was performed Future Appointments  Date Time Provider [place]  08/07/2022  9:00 AM Darrol Jump, NP [facility] None  10/20/2022 10:00 AM Baird Lyons D, MD [facility] None  01/01/2023  4:00 PM Alycia Rossetti, NP [facility] None    Subjective:  Karen Barrera is a 71 y.o. female who presents for AWV. She has Seasonal and perennial allergic rhinitis; Depression; Hyperlipidemia; Other abnormal glucose (prediabetes); Vitamin D deficiency; Former smoker; Medication management; Hx of adenomatous colonic polyps; Costochondritis; COPD mixed type ([place]); Hot flashes; Obesity (BMI 30.0-34.9); Genetic testing; Aortic atherosclerosis ([place]) - per CT 05/2017; Fatty liver; Agatston coronary artery calcium score greater than 400; Lung cancer ([place]); Hormone replacement therapy; Arthritis; Gastroesophageal reflux disease; Osteopenia; Primary osteoarthritis of first carpometacarpal joint of left hand; Family history of cancer; VIN III (vulvar intraepithelial neoplasia III); Memory changes; Liver lesion, right lobe; and Essential hypertension on their problem list.  She is feeling more forgetful  She has allergies, has been taking zyrtec AM, benadryl PM; she reports 2 weeks of worsening congestion  and sinus fullness; has tried mucinex, taking sudafed BID PRN, has  tried some nasal sprays but unsure what and hasn't done consistently. Denies HA, fever/chills, sinus tenderness. She is concerned due to leaving on trip to [place] tomorrow. Dry cough with post-nasal drip.   Remote TAH. She is recently following with GYN Dr[facility]ston [facility] and Dr. Denman George for VIN III, planning q14m follow up. Still has hot flashes, but off of estrogen due to cancer.   Recently on 08/01/2020 had R lumpectomy by Dr. Ninfa Linden for 0.6 x 0.7 x 0.3 lesion was seen at the 1 o'clock position of the right breast near the nipple areolar complex seen on recent mammogram.  A biopsy of this was performed showing a complex sclerosing lesion, biopsy returned benign. Can resume routine annual mammograms.   She has COPD, s/p left lobectomy for stage 2 non small cell carcinoma on 09/28/2018 with Dr. Servando Snare. She has not smoked since March 3rd 2020. Occasional dry cough but has improved, she uses albuterol to take as needed, didn't see benefit with anoro. Had CT 10/04/2020 showing no evidence of recurrent or metastatic disease.   Recurrent lung imaging has showed hepatic steatosis and R lobe lesion, originally noted on CT 10/2014 and characterized as cyst at that time, approx 3 cm, overall fairly stable on serial imaging since that time, felt likely cyst without concerning features. MRI has been suggested if needing to characterize further.  She is on prilosec 40 mg, recently takes PRN, hasn't needed in some time.  BMI is Body mass index is 30.59 kg/m., she has been working on diet and exercise, trying to lose weight, down from 199 lb, doing fruits and veggies. Very active running after her great grandson, 19 months.  Admits to drinking too much diet Dr. Malachi Bonds.  Wt Readings from Last 3 Encounters:  02/04/22 178 lb 3.2 oz (80.8 kg)  10/17/21 183 lb 3.2 oz (83.1 kg)  08/15/21 183 lb 3.2 oz (83.1 kg)   She follows with Dr. Claiborne Billings  for [place]; Ct chest 2018 showed aortic atherosclerosis; cath 08/2018 revealed evidence for coronary calcification with mild nonobstructive CAD for which medical therapy was recommended.  Her blood pressure has been controlled at home, today their BP is BP: 122/78 She does not workout, she is cleaning, being active inside and watches great grandson but not doing any working out. .She denies chest pain, shortness of breath, dizziness.   She is on cholesterol medication and denies myalgias. Her cholesterol is not at goal. The cholesterol last visit was:   Lab Results  Component Value Date   CHOL 204 (H) 08/07/2021   HDL 76 08/07/2021   LDLCALC 108 (H) 08/07/2021   TRIG 107 08/07/2021   CHOLHDL 2.7 08/07/2021   She has prediabetes, has been working on diet/exericse. Denies diabetic polys. Last A1C:  Lab Results  Component Value Date   HGBA1C 6.2 (H) 08/07/2021   Last GFR: Lab Results  Component Value Date   GFRNONAA 65 12/31/2020   Patient is on Vitamin D supplement, taking 10000 IU  Lab Results  Component Value Date   VD25OH 26 08/07/2021     She has not started on a b12 supplement;  Lab Results  Component Value Date   VITAMINB12 422 08/07/2021    Medication Review:   Current Outpatient Medications (Cardiovascular):  .  ezetimibe (ZETIA) 10 MG tablet, Take 1 tab daily in the evening for cholesterol. .  metoprolol succinate (TOPROL-XL) 25 MG 24 hr tablet, TAKE 1 TABLET BY MOUTH EVERY DAY .  rosuvastatin (CRESTOR) 40  MG tablet, TAKE ONE TABLET BY MOUTH DAILY  Current Outpatient Medications (Respiratory):  .  albuterol (PROVENTIL) (2.5 MG/3ML) 0.083% nebulizer solution, Take 3 mLs (2.5 mg total) by nebulization every 6 (six) hours as needed for wheezing or shortness of breath. Marland Kitchen  albuterol (VENTOLIN HFA) 108 (90 Base) MCG/ACT inhaler, Inhale 2 puffs every 6 hours if needed .  cetirizine (ZYRTEC) 10 MG tablet, Take 10 mg by mouth daily. .  diphenhydrAMINE (BENADRYL) 25 MG  tablet, Take 25 mg by mouth daily. Marland Kitchen  umeclidinium-vilanterol (ANORO ELLIPTA) 62.5-25 MCG/ACT AEPB, Inhale 1 puff into the lungs daily.  Current Outpatient Medications (Analgesics):  .  aspirin EC 81 MG tablet, Take 81 mg by mouth daily. Marland Kitchen  ibuprofen (ADVIL,MOTRIN) 200 MG tablet, Take 600 mg by mouth every 8 (eight) hours as needed for headache or moderate pain.    Current Outpatient Medications (Other):  Marland Kitchen  Calcium Carbonate Antacid (CALCIUM CARBONATE PO)*, Take 1,200 mg by mouth daily with breakfast. .  Cholecalciferol (VITAMIN D-3) 125 MCG (5000 UT) TABS, Take 2 caps (5,000) units = 10,000 units /daily .  sertraline (ZOLOFT) 100 MG tablet, TAKE 1 TABLET BY MOUTH DAILY FOR MOOD .  zinc gluconate 50 MG tablet, Take 50 mg by mouth daily. .  Ginkgo Biloba 40 MG TABS, Take by mouth. (Patient not taking: Reported on 02/04/2022) .  imiquimod (ALDARA) 5 % cream, as needed. (Patient not taking: Reported on 02/04/2022) * These medications belong to multiple therapeutic classes and are listed under each applicable group.   Allergies  Allergen Reactions  . Chantix [Varenicline] Anaphylaxis and Swelling    Makes throat swell up  . Moxifloxacin Hcl In Nacl Other (See Comments)    aches    Current Problems (verified) Patient Active Problem List   Diagnosis Date Noted  . Essential hypertension 04/07/2021  . Memory changes 12/31/2020  . Liver lesion, right lobe 12/31/2020  . VIN III (vulvar intraepithelial neoplasia III) 05/11/2020  . Primary osteoarthritis of first carpometacarpal joint of left hand 04/27/2019  . Hormone replacement therapy 04/06/2019  . Arthritis 04/06/2019  . Gastroesophageal reflux disease 04/06/2019  . Osteopenia 04/06/2019  . Family history of cancer 04/06/2019  . Lung cancer ([place]) 09/28/2018  . Agatston coronary artery calcium score greater than 400   . Fatty liver 08/29/2018  . Aortic atherosclerosis ([place]) - per CT 05/2017 08/06/2017  . Genetic testing 04/24/2017   . Hot flashes 11/12/2016  . Obesity (BMI 30.0-34.9) 11/12/2016  . COPD mixed type ([place]) 06/05/2016  . Costochondritis 03/23/2015  . Hx of adenomatous colonic polyps 01/23/2015  . Medication management 11/21/2014  . Depression   . Hyperlipidemia   . Other abnormal glucose (prediabetes)   . Vitamin D deficiency   . Former smoker   . Seasonal and perennial allergic rhinitis 07/03/2013    Screening Tests Immunization History  Administered Date(s) Administered  . Fluad Quad(high Dose 65+) 08/07/2021  . Influenza Split 05/16/2013, 05/12/2014, 05/14/2015  . Influenza, High Dose Seasonal PF 05/15/2017, 05/07/2018, 03/29/2019  . Influenza, Seasonal, Injecte, Preservative Fre 05/13/2016  . Influenza,inj,quad, With Preservative 05/07/2018  . Influenza-Unspecified 07/23/2020  . PFIZER(Purple Top)SARS-COV-2 Vaccination 10/01/2019, 10/22/2019, 05/17/2020  . Pneumococcal Conjugate-13 11/12/2016  . Pneumococcal Polysaccharide-23 06/14/2011  . Tdap 03/27/2010  . Unspecified SARS-COV-2 Vaccination 10/01/2019, 10/22/2019  . Zoster, Live 04/14/2012    Tetanus: 2011 due, will get PRN Pneumovax:  2012 Prevnar 13: 2018 Flu vaccine: 2021 Zostavax: 2013 COVID: 2/2, 2021 + booster Shingrix 2021 has gotten both at  pharmacy -  need documentation  MGM: 03/2021 diagnostic, getting annual screening via GYN DEXA: 2019  At GYN Osteopenia will get at GYN?? PAP getting by GYN, vulvar lesion is monitored annually Colonoscopy: 05/2020 due in 3 years, 5 polyps, Dr. Carlean Purl  PFT 08/2018 CT chest 03/2019 Cath 08/2018  Names of Other Physician/Practitioners you currently use: 1.  [facility] for primary care 2. Dr. Dorena Cookey, eye doctor, last visit 2022 3. Dr. Eliezer Bottom, dentist 2022 4. Dr. Marland Kitchen derm, last visit 04/2020 for total body check   Patient Care Team: [name], MD as PCP - General (Internal Medicine) Troy Sine, MD as PCP - Cardiology  (Cardiology) Izora Gala, MD as Consulting Physician (Otolaryngology) Delila Pereyra, MD as Consulting Physician (Gynecology) Gatha Mayer, MD as Consulting Physician (Gastroenterology) Maple Hudson, MD as Referring Physician (Surgery)  SURGICAL HISTORY She  has a past surgical history that includes Shoulder arthroscopy w/ rotator cuff repair (Left, 2012); Tendon repair (Left, 10/2009); Shoulder arthroscopy (Right, 2003); Salpingoophorectomy (Right, 1999); Sinus exploration (12/2013); Vaginal hysterectomy (1978); laparoscopic appendectomy (N/A, 10/31/2014); Appendectomy; Colonoscopy; Cyst excision (Right, 01/11/2016); Arthrotomy (Right, 01/11/2016); LEFT HEART CATH AND CORONARY ANGIOGRAPHY (N/A, 09/06/2018); Video bronchoscopy (N/A, 09/28/2018); Video assisted thoracoscopy (vats)/wedge resection (Left, 09/28/2018); Ganglion cyst excision (Right, 02/21/2020); Polypectomy; and Breast lumpectomy with radioactive seed localization (Right, 08/01/2020). FAMILY HISTORY Her family history includes Alzheimer's disease in her father; Arthritis in her paternal grandmother; Cancer in an other family member; Colon cancer in an other family member; Colon cancer (age of onset: 73) in some other family members; Colon polyps in her father; Diabetes in her mother; Epilepsy in her sister; Hyperlipidemia in her maternal grandfather; Melanoma (age of onset: 76) in her paternal grandmother; Skin cancer (age of onset: 44) in her mother; Stroke in her maternal grandmother. SOCIAL HISTORY She  reports that she quit smoking about 3 years ago. Her smoking use included cigarettes. She has a 50.00 pack-year smoking history. She has never used smokeless tobacco. She reports that she does not drink alcohol and does not use drugs.  MEDICARE WELLNESS OBJECTIVES: Physical activity:   Cardiac risk factors:   Depression/mood screen:      08/07/2021    9:35 AM  Depression screen PHQ 2/9  Decreased Interest 0  Down, Depressed, Hopeless 0   PHQ - 2 Score 0  Altered sleeping 0  Tired, decreased energy 0  Change in appetite 0  Feeling bad or failure about yourself  0  Trouble concentrating 0  Moving slowly or fidgety/restless 0  Suicidal thoughts 0  PHQ-9 Score 0  Difficult doing work/chores Not difficult at all    ADLs:     04/08/2021   10:42 AM  In your present state of health, do you have any difficulty performing the following activities:  Hearing? 0  Vision? 0  Difficulty concentrating or making decisions? 0  Walking or climbing stairs? 0  Dressing or bathing? 0  Doing errands, shopping? 0     Cognitive Testing  Alert? Yes  Normal Appearance?Yes  Oriented to person? Yes  Place? Yes   Time? Yes  Recall of three objects?  Yes  Can perform simple calculations? Yes  Displays appropriate judgment?Yes  Can read the correct time from a watch face?Yes  EOL planning:        Review of Systems  Constitutional: Negative.  Negative for malaise/fatigue and weight loss.  HENT:  Positive for congestion. Negative for ear pain, hearing loss, sinus pain  and tinnitus.        Chronically decreased smell and taste, has seen ENT  Eyes: Negative.  Negative for blurred vision and double vision.  Respiratory:  Negative for cough, hemoptysis, sputum production, shortness of breath and wheezing.   Cardiovascular: Negative.  Negative for chest pain, palpitations, orthopnea, claudication and leg swelling.  Gastrointestinal:  Negative for abdominal pain, blood in stool, constipation, diarrhea, heartburn, melena, nausea and vomiting.  Genitourinary: Negative.   Musculoskeletal:  Negative for back pain, falls, joint pain, myalgias and neck pain.  Skin: Negative.  Negative for rash.  Neurological: Negative.  Negative for dizziness, tingling, sensory change, weakness and headaches.  Endo/Heme/Allergies:  Positive for environmental allergies. Negative for polydipsia.  Psychiatric/Behavioral:  Positive for memory loss (more  difficulty with navigation, good short term recall ). Negative for depression, hallucinations, substance abuse and suicidal ideas. The patient is not nervous/anxious and does not have insomnia.   All other systems reviewed and are negative.    Objective:     Today's Vitals   02/04/22 1027  BP: 122/78  Pulse: 63  Temp: (!) 96.8 F (36 C)  SpO2: 98%  Weight: 178 lb 3.2 oz (80.8 kg)   Body mass index is 30.59 kg/m.  General appearance: alert, no distress, WD/WN, female HEENT: normocephalic, sclerae anicteric, TMs pearly, nares patent, no discharge or erythema, pharynx normal, no sinus tenderness Oral cavity: MMM, no lesions Neck: supple, no lymphadenopathy, no thyromegaly, no masses Heart: RRR, normal S1, S2, no murmur  Lungs: CTA bilaterally, no wheezes, rhonchi, or rales Abdomen: +bs, soft, non-tender, non distended, no masses, no hepatomegaly, no splenomegaly Musculoskeletal: nontender, no swelling, no obvious deformity Extremities: no edema, no cyanosis, no clubbing Pulses: 2+ symmetric, upper and lower extremities, normal cap refill Neurological: alert, oriented x 3, CN2-12 intact, strength normal upper extremities and lower extremities, sensation normal throughout, DTRs 2+ throughout, no cerebellar signs, gait normal Psychiatric: normal affect, behavior normal, pleasant    Medicare Attestation I have personally reviewed: The patient's medical and social history Their use of alcohol, tobacco or illicit drugs Their current medications and supplements The patient's functional ability including ADLs,fall risks, home safety risks, cognitive, and hearing and visual impairment Diet and physical activities Evidence for depression or mood disorders  The patient's weight, height, BMI, and visual acuity have been recorded in the chart.  I have made referrals, counseling, and provided education to the patient based on review of the above and I have provided the patient with a written  personalized care plan for preventive services.     Darrol Jump, NP   02/04/2022

## 2022-02-05 LAB — COMPLETE METABOLIC PANEL WITH GFR
AG Ratio: 2.1 (calc) (ref 1.0–2.5)
ALT: 27 U/L (ref 6–29)
AST: 32 U/L (ref 10–35)
Albumin: 4.7 g/dL (ref 3.6–5.1)
Alkaline phosphatase (APISO): 73 U/L (ref 37–153)
BUN: 16 mg/dL (ref 7–25)
CO2: 25 mmol/L (ref 20–32)
Calcium: 9.6 mg/dL (ref 8.6–10.4)
Chloride: 107 mmol/L (ref 98–110)
Creat: 0.91 mg/dL (ref 0.60–1.00)
Globulin: 2.2 g/dL (calc) (ref 1.9–3.7)
Glucose, Bld: 91 mg/dL (ref 65–99)
Potassium: 5 mmol/L (ref 3.5–5.3)
Sodium: 141 mmol/L (ref 135–146)
Total Bilirubin: 0.5 mg/dL (ref 0.2–1.2)
Total Protein: 6.9 g/dL (ref 6.1–8.1)
eGFR: 68 mL/min/{1.73_m2} (ref >=60)

## 2022-02-05 LAB — URINALYSIS, ROUTINE W REFLEX MICROSCOPIC
Bilirubin Urine: NEGATIVE
Glucose, UA: NEGATIVE
Hgb urine dipstick: NEGATIVE
Ketones, ur: NEGATIVE
Leukocytes,Ua: NEGATIVE
Nitrite: NEGATIVE
Protein, ur: NEGATIVE
Specific Gravity, Urine: 1.023 (ref 1.001–1.035)
pH: 5.5 (ref 5.0–8.0)

## 2022-02-05 LAB — CBC WITH DIFFERENTIAL/PLATELET
Absolute Monocytes: 360 cells/uL (ref 200–950)
Basophils Absolute: 58 cells/uL (ref 0–200)
Basophils Relative: 1 %
Eosinophils Absolute: 93 cells/uL (ref 15–500)
Eosinophils Relative: 1.6 %
HCT: 43.7 % (ref 35.0–45.0)
Hemoglobin: 14.7 g/dL (ref 11.7–15.5)
Lymphs Abs: 1659 cells/uL (ref 850–3900)
MCH: 29.2 pg (ref 27.0–33.0)
MCHC: 33.6 g/dL (ref 32.0–36.0)
MCV: 86.9 fL (ref 80.0–100.0)
MPV: 10.5 fL (ref 7.5–12.5)
Monocytes Relative: 6.2 %
Neutro Abs: 3631 cells/uL (ref 1500–7800)
Neutrophils Relative %: 62.6 %
Platelets: 189 10*3/uL (ref 140–400)
RBC: 5.03 10*6/uL (ref 3.80–5.10)
RDW: 13.2 % (ref 11.0–15.0)
Total Lymphocyte: 28.6 %
WBC: 5.8 10*3/uL (ref 3.8–10.8)

## 2022-02-05 LAB — HEMOGLOBIN A1C
Hgb A1c MFr Bld: 6 % of total Hgb — ABNORMAL HIGH (ref <5.7)
Mean Plasma Glucose: 126 mg/dL
eAG (mmol/L): 7 mmol/L

## 2022-02-05 LAB — LIPID PANEL
Cholesterol: 158 mg/dL (ref <200)
HDL: 75 mg/dL (ref >=50)
LDL Cholesterol (Calc): 67 mg/dL (calc)
Non-HDL Cholesterol (Calc): 83 mg/dL (calc) (ref <130)
Total CHOL/HDL Ratio: 2.1 (calc) (ref <5.0)
Triglycerides: 75 mg/dL (ref <150)

## 2022-03-26 NOTE — Progress Notes (Unsigned)
Office Visit    Patient Name: Karen Barrera [facility] Date of Encounter: 03/27/2022  Primary Care Provider:  [name], MD Primary Cardiologist:  Shelva Majestic, MD  Chief Complaint    71 year old female with a history of CAD, aortic atherosclerosis, hyperlipidemia, former tobacco use, COPD, lung cancer s/p left upper lobe lobectomy, GERD, and obesity who presents for follow-up related to CAD.  Past Medical History    Past Medical History:  Diagnosis Date   Acute perforated appendicitis 10/31/2014   Adenomatous colon polyp    Allergic rhinitis    Allergy    Anemia    Arthritis    "fingers" (10/31/2014)   Chronic bronchitis ([place])    "got it q yr til I had my nose OR" (10/31/2014)   COPD (chronic obstructive pulmonary disease) ([place])    Coronary artery disease    DDD (degenerative disc disease), cervical    DDD (degenerative disc disease), lumbar    Depression    "short term when my sister passed away unexpectedly"   Family history of adverse reaction to anesthesia    "mother is hard to wake up; a little goes a long way w/her"   Family history of breast cancer    Family history of colon cancer    Family history of skin cancer    GERD (gastroesophageal reflux disease)    History of stomach ulcers    Hyperlipidemia    "borderline; RX is preventative" (10/31/2014)   Kidney stones    "they passed"   Lung cancer ([place]) 09/28/2018   Migraine    "used to get them alot; don't get them anymore" (10/31/2014)   Osteoarthritis of both knees    Osteopenia    Pneumonia 2003 X 1   Pre-diabetes    Seizures ([place]) ~ 1962 X 1   "from an abscessed wisdom tooth"   Tobacco dependence    Vitamin D deficiency    Past Surgical History:  Procedure Laterality Date   APPENDECTOMY     ARTHROTOMY Right 01/11/2016   Procedure: DISTAL INTERPHALANGEAL JOINT ARTHROTOMY RIGHT INDEX FINGER;  Surgeon: Leanora Cover, MD;  Location: [place];  Service: Orthopedics;  Laterality: Right;   BREAST  LUMPECTOMY WITH RADIOACTIVE SEED LOCALIZATION Right 08/01/2020   Procedure: RIGHT BREAST LUMPECTOMY WITH RADIOACTIVE SEED LOCALIZATION;  Surgeon: [name], MD;  Location: [place];  Service: General;  Laterality: Right;  LMA   COLONOSCOPY     CYST EXCISION Right 01/11/2016   Procedure: EXCISION MUCOID CYST;  Surgeon: Leanora Cover, MD;  Location: [place];  Service: Orthopedics;  Laterality: Right;   GANGLION CYST EXCISION Right 02/21/2020   Procedure: REMOVAL GANGLION OF WRIST;  Surgeon: Leanora Cover, MD;  Location: [place];  Service: Orthopedics;  Laterality: Right;   LAPAROSCOPIC APPENDECTOMY N/A 10/31/2014   Procedure: APPENDECTOMY LAPAROSCOPIC;  Surgeon: Alphonsa Overall, MD;  Location: [place];  Service: General;  Laterality: N/A;   LEFT HEART CATH AND CORONARY ANGIOGRAPHY N/A 09/06/2018   Procedure: LEFT HEART CATH AND CORONARY ANGIOGRAPHY;  Surgeon: Troy Sine, MD;  Location: [place] [facility];  Service: Cardiovascular;  Laterality: N/A;   POLYPECTOMY     SALPINGOOPHORECTOMY Right 1999   "w/grapefruit-sized polyp"   SHOULDER ARTHROSCOPY Right 2003   removal spurs   SHOULDER ARTHROSCOPY W/ ROTATOR CUFF REPAIR Left 2012   SINUS EXPLORATION  12/2013   [place]   TENDON REPAIR Left 10/2009   torn tendon @ elbow   VAGINAL HYSTERECTOMY  [place] (VATS)/WEDGE RESECTION Left 09/28/2018   Procedure: LEFT VIDEO ASSISTED THORACOSCOPY (VATS)/Completion Left Upper lobe Lobectomy. Lymph node dissection. Intercostal nerve block.;  Surgeon: Grace Isaac, MD;  Location: [place];  Service: Thoracic;  Laterality: Left;   VIDEO BRONCHOSCOPY N/A 09/28/2018   Procedure: VIDEO BRONCHOSCOPY;  Surgeon: Grace Isaac, MD;  Location: [facility];  Service: Thoracic;  Laterality: N/A;    Allergies  Allergies  Allergen Reactions   Chantix [Varenicline] Anaphylaxis and Swelling    Makes throat swell up   Moxifloxacin Hcl In Nacl Other (See Comments)     aches    History of Present Illness    71 year old female with the above past medical history including CAD, aortic atherosclerosis, hyperlipidemia, former tobacco use, COPD, lung cancer s/p left upper lobe lobectomy, GERD, and obesity.  She has a longstanding history of tobacco use and  COPD (follows with pulmonology).  CT of the chest for monitoring of left upper lobe nodule in 2019 showed coronary artery calcification.  Echocardiogram in 2019 showed normal systolic function, G1 DD.  Coronary CT angiogram in 2019 showed coronary artery calcium score of 891 (97 percentile), 70 to 90% stenosis in the proximal LAD, mild stenosis in the proximal left circumflex artery, FFR was significant stenosis in the LAD.  Noncardiac findings included an interval increase in soft tissue thickening along the posterior lateral aspect of the left upper lobe bulla concerning for neoplasm.  Cardiac catheterization in February 2020 showed moderate coronary calcification without high-grade obstructive disease (20-30% p-m LAD, 55% mLAD beyond the small diagonal vessel, LCx with 20% proximal narrowing and 20% narrowing in the OM1, and 20-30% p-mRCA stenoses), normal LV function, no RWMA.  Medical therapy was recommended.  She underwent bronchoscopy for progressive lung nodule and ultimately left video-assisted thoracoscopy, wedge resection, and left upper lobe lobectomy with lymph node dissection in 09/2018.  She did not require adjunct chemotherapy.  Annual CT imaging was recommended continued monitoring.  She was last seen in the office on 12/19/2020 and was stable from a cardiac standpoint.  She denied symptoms concerning for angina.  BP was well controlled.  She presents today for follow-up.  Since her last visit he has done well from a cardiac standpoint.  She denies any symptoms concerning for angina.  She continues to struggle with loss of taste, this has been an ongoing surgery issue since her lung surgery.  She is  active, she denies any dyspnea.  BP is well controlled.  She does report difficulty losing weight.  Otherwise, she reports feeling well denies any additional concerns today.  Home Medications    Current Outpatient Medications  Medication Sig Dispense Refill   albuterol (PROVENTIL) (2.5 MG/3ML) 0.083% nebulizer solution Take 3 mLs (2.5 mg total) by nebulization every 6 (six) hours as needed for wheezing or shortness of breath. 225 mL 3   albuterol (VENTOLIN HFA) 108 (90 Base) MCG/ACT inhaler Inhale 2 puffs every 6 hours if needed 25 g 3   aspirin EC 81 MG tablet Take 81 mg by mouth daily.     Calcium Carbonate Antacid (CALCIUM CARBONATE PO) Take 1,200 mg by mouth daily with breakfast.     cetirizine (ZYRTEC) 10 MG tablet Take 10 mg by mouth daily.     Cholecalciferol (VITAMIN D-3) 125 MCG (5000 UT) TABS Take 2 caps (5,000) units = 10,000 units /daily 30 tablet 0   diphenhydrAMINE (BENADRYL) 25 MG tablet Take 25 mg by mouth daily.  Ginkgo Biloba 40 MG TABS Take by mouth.     ibuprofen (ADVIL,MOTRIN) 200 MG tablet Take 600 mg by mouth every 8 (eight) hours as needed for headache or moderate pain.      imiquimod (ALDARA) 5 % cream as needed.     sertraline (ZOLOFT) 100 MG tablet TAKE 1 TABLET BY MOUTH DAILY FOR MOOD 90 tablet 3   umeclidinium-vilanterol (ANORO ELLIPTA) 62.5-25 MCG/ACT AEPB Inhale 1 puff into the lungs daily. 180 each 3   zinc gluconate 50 MG tablet Take 50 mg by mouth daily.     ezetimibe (ZETIA) 10 MG tablet Take 1 tab daily in the evening for cholesterol. 90 tablet 3   metoprolol succinate (TOPROL-XL) 25 MG 24 hr tablet Take 1 tablet (25 mg total) by mouth daily. 90 tablet 3   rosuvastatin (CRESTOR) 40 MG tablet Take 1 tablet (40 mg total) by mouth daily. 90 tablet 3   No current facility-administered medications for this visit.     Review of Systems    She denies chest pain, palpitations, dyspnea, pnd, orthopnea, n, v, dizziness, syncope, edema, weight gain, or early  satiety. All other systems reviewed and are otherwise negative except as noted above.   Physical Exam    VS:  BP 116/78   Pulse (!) 58   Ht 5\' 4"  (1.626 m)   Wt 180 lb 12.8 oz (82 kg)   SpO2 98%   BMI 31.03 kg/m   GEN: Well nourished, well developed, in no acute distress. HEENT: normal. Neck: Supple, no JVD, carotid bruits, or masses. Cardiac: RRR, no murmurs, rubs, or gallops. No clubbing, cyanosis, edema.  Radials/DP/PT 2+ and equal bilaterally.  Respiratory:  Respirations regular and unlabored, clear to auscultation bilaterally. GI: Soft, nontender, nondistended, BS + x 4. MS: no deformity or atrophy. Skin: warm and dry, no rash. Neuro:  Strength and sensation are intact. Psych: Normal affect.  Accessory Clinical Findings    ECG personally reviewed by me today -sinus bradycardia, 58 bpm- no acute changes.   Lab Results  Component Value Date   WBC 5.8 02/04/2022   HGB 14.7 02/04/2022   HCT 43.7 02/04/2022   MCV 86.9 02/04/2022   PLT 189 02/04/2022   Lab Results  Component Value Date   CREATININE 0.91 02/04/2022   BUN 16 02/04/2022   NA 141 02/04/2022   K 5.0 02/04/2022   CL 107 02/04/2022   CO2 25 02/04/2022   Lab Results  Component Value Date   ALT 27 02/04/2022   AST 32 02/04/2022   ALKPHOS 46 09/30/2018   BILITOT 0.5 02/04/2022   Lab Results  Component Value Date   CHOL 158 02/04/2022   HDL 75 02/04/2022   LDLCALC 67 02/04/2022   TRIG 75 02/04/2022   CHOLHDL 2.1 02/04/2022    Lab Results  Component Value Date   HGBA1C 6.0 (H) 02/04/2022    Assessment & Plan    1. CAD/aortic atherosclerosis: Cardiac catheterization in February 2020 showed moderate coronary calcification without high-grade obstructive disease (20-30% p-m LAD, 55% mLAD beyond the small diagonal vessel, LCx with 20% proximal narrowing and 20% narrowing in the OM1, and 20-30% p-mRCA stenoses), normal LV function, no RWMA.  Medical therapy was recommended. Stable with no anginal  symptoms. No indication for ischemic evaluation.  Continue aspirin, metoprolol, Crestor, and Zetia.  2. Hyperlipidemia: LDL was 67 in 01/2022.  Monitored and managed per PCP. Continue aspirin, Crestor, Zetia.    3. COPD/h/o lung cancer: S/p left upper  lobe lobectomy with lymph node dissection in 09/2018. Denies dyspnea, following with pulmonology.  4. Obesity: States she has been frustrated with her inability to lose weight.  Discussed possible referral to healthy weight and wellness, she declines at this time.  States it has been difficult to eat regular meals due to her loss of taste.  Encouraged ongoing lifestyle modifications with diet and exercise as tolerated.  5. Disposition: Follow-up in 1 year.      Lenna Sciara, NP 03/27/2022, 8:25 AM

## 2022-03-27 ENCOUNTER — Ambulatory Visit: Payer: Medicare HMO | Attending: Nurse Practitioner | Admitting: Nurse Practitioner

## 2022-03-27 ENCOUNTER — Encounter: Payer: Self-pay | Admitting: Nurse Practitioner

## 2022-03-27 VITALS — BP 116/78 | HR 58 | Ht 64.0 in | Wt 180.8 lb

## 2022-03-27 DIAGNOSIS — C3492 Malignant neoplasm of unspecified part of left bronchus or lung: Secondary | ICD-10-CM

## 2022-03-27 DIAGNOSIS — I2510 Atherosclerotic heart disease of native coronary artery without angina pectoris: Secondary | ICD-10-CM

## 2022-03-27 DIAGNOSIS — Z6833 Body mass index (BMI) 33.0-33.9, adult: Secondary | ICD-10-CM | POA: Diagnosis not present

## 2022-03-27 DIAGNOSIS — J449 Chronic obstructive pulmonary disease, unspecified: Secondary | ICD-10-CM

## 2022-03-27 DIAGNOSIS — E785 Hyperlipidemia, unspecified: Secondary | ICD-10-CM

## 2022-03-27 DIAGNOSIS — I700 Atherosclerosis of aorta: Secondary | ICD-10-CM

## 2022-03-27 DIAGNOSIS — E6609 Other obesity due to excess calories: Secondary | ICD-10-CM

## 2022-03-27 MED ORDER — METOPROLOL SUCCINATE ER 25 MG PO TB24: 25.0000 mg | ORAL_TABLET | Freq: Every day | ORAL | 3 refills | Status: DC

## 2022-03-27 MED ORDER — EZETIMIBE 10 MG PO TABS: ORAL_TABLET | ORAL | 3 refills | Status: DC

## 2022-03-27 MED ORDER — ROSUVASTATIN CALCIUM 40 MG PO TABS: 40.0000 mg | ORAL_TABLET | Freq: Every day | ORAL | 3 refills | Status: AC

## 2022-03-27 NOTE — Patient Instructions (Signed)
Medication Instructions:  Your physician recommends that you continue on your current medications as directed. Please refer to the Current Medication list given to you today.   *If you need a refill on your cardiac medications before your next appointment, please call your pharmacy*   Lab Work: NONE ordered at this time of appointment   If you have labs (blood work) drawn today and your tests are completely normal, you will receive your results only by: [place] (if you have MyChart) OR A paper copy in the mail If you have any lab test that is abnormal or we need to change your treatment, we will call you to review the results.   Testing/Procedures: NONE ordered at this time of appointment     Follow-Up: At [facility], you and your health needs are our priority.  As part of our continuing mission to provide you with exceptional heart care, we have created designated Provider Care Teams.  These Care Teams include your primary Cardiologist (physician) and Advanced Practice Providers (APPs -  Physician Assistants and Nurse Practitioners) who all work together to provide you with the care you need, when you need it.  We recommend signing up for the patient portal called "MyChart".  Sign up information is provided on this After Visit Summary.  MyChart is used to connect with patients for Virtual Visits (Telemedicine).  Patients are able to view lab/test results, encounter notes, upcoming appointments, etc.  Non-urgent messages can be sent to your provider as well.   To learn more about what you can do with MyChart, go to [email or web address].    Your next appointment:   1 year(s)  The format for your next appointment:   In Person  Provider:   Shelva Majestic, MD     Other Instructions   Important Information About Sugar

## 2022-05-14 DIAGNOSIS — L821 Other seborrheic keratosis: Secondary | ICD-10-CM | POA: Diagnosis not present

## 2022-05-14 DIAGNOSIS — D1801 Hemangioma of skin and subcutaneous tissue: Secondary | ICD-10-CM | POA: Diagnosis not present

## 2022-05-14 DIAGNOSIS — L820 Inflamed seborrheic keratosis: Secondary | ICD-10-CM | POA: Diagnosis not present

## 2022-05-14 DIAGNOSIS — L812 Freckles: Secondary | ICD-10-CM | POA: Diagnosis not present

## 2022-07-07 DIAGNOSIS — Z01419 Encounter for gynecological examination (general) (routine) without abnormal findings: Secondary | ICD-10-CM | POA: Diagnosis not present

## 2022-07-07 DIAGNOSIS — Z6831 Body mass index (BMI) 31.0-31.9, adult: Secondary | ICD-10-CM | POA: Diagnosis not present

## 2022-07-07 DIAGNOSIS — Z1272 Encounter for screening for malignant neoplasm of vagina: Secondary | ICD-10-CM | POA: Diagnosis not present

## 2022-07-07 DIAGNOSIS — M8588 Other specified disorders of bone density and structure, other site: Secondary | ICD-10-CM | POA: Diagnosis not present

## 2022-07-07 DIAGNOSIS — Z1231 Encounter for screening mammogram for malignant neoplasm of breast: Secondary | ICD-10-CM | POA: Diagnosis not present

## 2022-08-07 ENCOUNTER — Encounter: Payer: Self-pay | Admitting: Nurse Practitioner

## 2022-08-07 ENCOUNTER — Ambulatory Visit (INDEPENDENT_AMBULATORY_CARE_PROVIDER_SITE_OTHER): Payer: Medicare HMO | Admitting: Nurse Practitioner

## 2022-08-07 VITALS — BP 112/80 | HR 76 | Temp 97.9°F | Resp 17 | Ht 64.0 in | Wt 179.4 lb

## 2022-08-07 DIAGNOSIS — E669 Obesity, unspecified: Secondary | ICD-10-CM

## 2022-08-07 DIAGNOSIS — R232 Flushing: Secondary | ICD-10-CM

## 2022-08-07 DIAGNOSIS — J0100 Acute maxillary sinusitis, unspecified: Secondary | ICD-10-CM

## 2022-08-07 DIAGNOSIS — Z1389 Encounter for screening for other disorder: Secondary | ICD-10-CM | POA: Diagnosis not present

## 2022-08-07 DIAGNOSIS — E538 Deficiency of other specified B group vitamins: Secondary | ICD-10-CM | POA: Diagnosis not present

## 2022-08-07 DIAGNOSIS — C3490 Malignant neoplasm of unspecified part of unspecified bronchus or lung: Secondary | ICD-10-CM

## 2022-08-07 DIAGNOSIS — Z0000 Encounter for general adult medical examination without abnormal findings: Secondary | ICD-10-CM | POA: Diagnosis not present

## 2022-08-07 DIAGNOSIS — Z86010 Personal history of colonic polyps: Secondary | ICD-10-CM

## 2022-08-07 DIAGNOSIS — K760 Fatty (change of) liver, not elsewhere classified: Secondary | ICD-10-CM

## 2022-08-07 DIAGNOSIS — E559 Vitamin D deficiency, unspecified: Secondary | ICD-10-CM | POA: Diagnosis not present

## 2022-08-07 DIAGNOSIS — I700 Atherosclerosis of aorta: Secondary | ICD-10-CM

## 2022-08-07 DIAGNOSIS — Z87891 Personal history of nicotine dependence: Secondary | ICD-10-CM

## 2022-08-07 DIAGNOSIS — R931 Abnormal findings on diagnostic imaging of heart and coronary circulation: Secondary | ICD-10-CM

## 2022-08-07 DIAGNOSIS — Z79899 Other long term (current) drug therapy: Secondary | ICD-10-CM

## 2022-08-07 DIAGNOSIS — I10 Essential (primary) hypertension: Secondary | ICD-10-CM

## 2022-08-07 DIAGNOSIS — E782 Mixed hyperlipidemia: Secondary | ICD-10-CM

## 2022-08-07 DIAGNOSIS — F3342 Major depressive disorder, recurrent, in full remission: Secondary | ICD-10-CM

## 2022-08-07 DIAGNOSIS — R7309 Other abnormal glucose: Secondary | ICD-10-CM

## 2022-08-07 DIAGNOSIS — J449 Chronic obstructive pulmonary disease, unspecified: Secondary | ICD-10-CM

## 2022-08-07 DIAGNOSIS — Z0001 Encounter for general adult medical examination with abnormal findings: Secondary | ICD-10-CM

## 2022-08-07 MED ORDER — AZITHROMYCIN 500 MG PO TABS: 500.0000 mg | ORAL_TABLET | Freq: Every day | ORAL | 0 refills | Status: AC

## 2022-08-07 NOTE — Progress Notes (Signed)
CPE AND 3 MONTH   Assessment:    CPE Due annually  Health maintenance reviewed  Aortic atherosclerosis (HCC) Control blood pressure, cholesterol, glucose, increase exercise.   COPD mixed type ([place]) No triggers, well controlled symptoms, cont to monitor Continue Albuterol, Ellipta  Malignant neoplasm of upper lobe of left lung (HCC) S/p lobectomy, continue follow up  Recurrent major depressive disorder, in full remission ([place]) Sertraline effective. Continue to monitor  Mixed hyperlipidemia Discussed lifestyle modifications. Recommended diet heavy in fruits and veggies, omega 3's. Decrease consumption of animal meats, cheeses, and dairy products. Remain active and exercise as tolerated. Continue to monitor. Check lipids/TSH   Vitamin D deficiency Continue supplement Monitor levels  Medication management All medications discussed and reviewed in full. All questions and concerns regarding medications addressed.     Obesity  Discussed appropriate BMI Diet modification. Physical activity. Encouraged/praised to build confidence.   Fatty liver Weight loss advised, will monitor LFTs  Agatston coronary artery calcium score greater than 400 Control blood pressure, cholesterol, glucose, increase exercise.  Following with Dr. Claiborne Billings  Hot flashes Continue meds, off of estrogen, GYN managing  Hx of adenomatous colonic polyps UTD Completed 05/2020 Recall 3 years Due 05/2023  Abnormal glucose Education: Reviewed 'ABCs' of diabetes management  Discussed goals to be met and/or maintained include A1C (<7) Blood pressure (<130/80) Cholesterol (LDL <70) Continue Eye Exam yearly  Continue Dental Exam Q6 mo Discussed dietary recommendations Discussed Physical Activity recommendations Check A1C   B12 deficiency Continue supplement Monitor levels  Former smoker (50 pack year, quit 2020) Psychologist, sport and exercise following with C[facility]  Acute Sinusitis Start Z-Pak Stay well hydrated  to keep mucus thin and productive Discussed benefits of Vitamin C, D and Zinc  Orders Placed This Encounter  Procedures   CBC with Differential/Platelet   COMPLETE METABOLIC PANEL WITH GFR   Magnesium   Lipid panel   TSH   Hemoglobin A1c   Insulin, random   VITAMIN D 25 Hydroxy (Vit-D Deficiency, Fractures)   Urinalysis, Routine w reflex microscopic   Microalbumin / creatinine urine ratio   Vitamin B12   Meds ordered this encounter  Medications   azithromycin (ZITHROMAX) 500 MG tablet    Sig: Take 1 tablet (500 mg total) by mouth daily for 10 days.    Dispense:  10 tablet    Refill:  0    Order Specific Question:   Supervising Provider    Answer:   [name] [phone]    Notify office for further evaluation and treatment, questions or concerns if any reported s/s fail to improve.   The patient was advised to call back or seek an in-person evaluation if any symptoms worsen or if the condition fails to improve as anticipated.   Further disposition pending results of labs. Discussed med's effects and SE's.    I discussed the assessment and treatment plan with the patient. The patient was provided an opportunity to ask questions and all were answered. The patient agreed with the plan and demonstrated an understanding of the instructions.  Discussed med's effects and SE's. Screening labs and tests as requested with regular follow-up as recommended.  I provided 35 minutes of face-to-face time during this encounter including counseling, chart review, and critical decision making was preformed.  Future Appointments  Date Time Provider [place]  10/20/2022 10:00 AM Baird Lyons D, MD [facility] None  01/01/2023  4:00 PM Alycia Rossetti, NP [facility] None  08/11/2023  9:00 AM Darrol Jump, NP [facility] None  Subjective:  Karen Barrera is a 72 y.o. female who presents for CPE. She has Seasonal and perennial allergic rhinitis; Depression;  Hyperlipidemia; Other abnormal glucose (prediabetes); Vitamin D deficiency; Former smoker; Medication management; Hx of adenomatous colonic polyps; Costochondritis; COPD mixed type ([place]); Hot flashes; Obesity (BMI 30.0-34.9); Genetic testing; Aortic atherosclerosis ([place]) - per CT 05/2017; Fatty liver; Agatston coronary artery calcium score greater than 400; Lung cancer ([place]); Hormone replacement therapy; Arthritis; Gastroesophageal reflux disease; Osteopenia; Primary osteoarthritis of first carpometacarpal joint of left hand; Family history of cancer; VIN III (vulvar intraepithelial neoplasia III); Memory changes; Liver lesion, right lobe; and Essential hypertension on their problem list.  She is married, 52 years, 2 children, 4 grandkids, 1 greatgrandchild.   She just lost her oldest granddaughter from an asthma attack.  This has left her grieving however states that she has a good support system and relies on her faith.  She is continuing to take Sertraline.  Has had some frustration with the court system and custody of the great grandchild.    She has endorsed increase in nasal congestion and HA with sinus pressure along top of head and nose. Associated gum pain. Has been present for two weeks.  Home tested for covid which was negative.  Has tried OTC cold medications without relief.    She is retired Chief Executive Officer for [employer].   Recent followed up with Dermatology, Dr. Dian Situ 04/2022.   Remote TAH. She is recently following with GYN Dr[facility]ston [facility] and Dr. Denman George for VIN III, planning q35m follow up. Still has hot flashes, but off of estrogen due to cancer.  Recently followed with GYN for mammogram and bone density.    Recently on 08/01/2020 had R lumpectomy by Dr. Ninfa Linden for 0.6 x 0.7 x 0.3 lesion was seen at the 1 o'clock position of the right breast near the nipple areolar complex seen on recent mammogram.  A biopsy of this was performed showing a complex sclerosing  lesion, biopsy returned benign. Can resume routine annual mammograms. Last completed 03/2021.  She has COPD, s/p left lobectomy for stage 2 non small cell carcinoma on 09/28/2018 with Dr. Servando Snare. She has not smoked since March 3rd 2020. Occasional dry cough but has improved, she uses albuterol to take as needed, along with ellipita. Yearly CT follow up are completed with surgeon.   She is on prilosec 40 mg, recently takes PRN, hasn't needed in some time.  BMI is Body mass index is 30.79 kg/m., she has been working on diet and exercise. Wt Readings from Last 3 Encounters:  08/07/22 179 lb 6.4 oz (81.4 kg)  03/27/22 180 lb 12.8 oz (82 kg)  02/04/22 178 lb 3.2 oz (80.8 kg)   Her blood pressure has been controlled at home, today their BP is BP: 112/80  She does not workout, she is cleaning, being active inside but not doing any working out. She follows with Dr. Claiborne Billings.She denies chest pain, shortness of breath, dizziness.   She is on cholesterol medication and denies myalgias. Her cholesterol is at goal. The cholesterol last visit was:   Lab Results  Component Value Date   CHOL 158 02/04/2022   HDL 75 02/04/2022   LDLCALC 67 02/04/2022   TRIG 75 02/04/2022   CHOLHDL 2.1 02/04/2022   Last A1C:  Lab Results  Component Value Date   HGBA1C 6.0 (H) 02/04/2022   Last GFR: Lab Results  Component Value Date   GFRNONAA 65 12/31/2020  Patient is on Vitamin D supplement, taking 10000 IU  Lab Results  Component Value Date   VD25OH 52 08/07/2021      Lab Results  Component Value Date   IAXKPVVZ48 270 08/07/2021     Medication Review:   Current Outpatient Medications (Cardiovascular):    ezetimibe (ZETIA) 10 MG tablet, Take 1 tab daily in the evening for cholesterol.   metoprolol succinate (TOPROL-XL) 25 MG 24 hr tablet, Take 1 tablet (25 mg total) by mouth daily.   rosuvastatin (CRESTOR) 40 MG tablet, Take 1 tablet (40 mg total) by mouth daily.  Current Outpatient Medications  (Respiratory):    albuterol (PROVENTIL) (2.5 MG/3ML) 0.083% nebulizer solution, Take 3 mLs (2.5 mg total) by nebulization every 6 (six) hours as needed for wheezing or shortness of breath.   albuterol (VENTOLIN HFA) 108 (90 Base) MCG/ACT inhaler, Inhale 2 puffs every 6 hours if needed   cetirizine (ZYRTEC) 10 MG tablet, Take 10 mg by mouth daily.   diphenhydrAMINE (BENADRYL) 25 MG tablet, Take 25 mg by mouth daily.   umeclidinium-vilanterol (ANORO ELLIPTA) 62.5-25 MCG/ACT AEPB, Inhale 1 puff into the lungs daily.  Current Outpatient Medications (Analgesics):    aspirin EC 81 MG tablet, Take 81 mg by mouth daily.   ibuprofen (ADVIL,MOTRIN) 200 MG tablet, Take 600 mg by mouth every 8 (eight) hours as needed for headache or moderate pain.    Current Outpatient Medications (Other):    azithromycin (ZITHROMAX) 500 MG tablet, Take 1 tablet (500 mg total) by mouth daily for 10 days.   Calcium Carbonate Antacid (CALCIUM CARBONATE PO)*, Take 1,200 mg by mouth daily with breakfast.   Cholecalciferol (VITAMIN D-3) 125 MCG (5000 UT) TABS, Take 2 caps (5,000) units = 10,000 units /daily   Ginkgo Biloba 40 MG TABS, Take by mouth.   imiquimod (ALDARA) 5 % cream, as needed.   sertraline (ZOLOFT) 100 MG tablet, TAKE 1 TABLET BY MOUTH DAILY FOR MOOD   zinc gluconate 50 MG tablet, Take 50 mg by mouth daily. * These medications belong to multiple therapeutic classes and are listed under each applicable group.   Allergies  Allergen Reactions   Chantix [Varenicline] Anaphylaxis and Swelling    Makes throat swell up   Moxifloxacin Hcl In Nacl Other (See Comments)    aches    Current Problems (verified) Patient Active Problem List   Diagnosis Date Noted   Essential hypertension 04/07/2021   Memory changes 12/31/2020   Liver lesion, right lobe 12/31/2020   VIN III (vulvar intraepithelial neoplasia III) 05/11/2020   Primary osteoarthritis of first carpometacarpal joint of left hand 04/27/2019   Hormone  replacement therapy 04/06/2019   Arthritis 04/06/2019   Gastroesophageal reflux disease 04/06/2019   Osteopenia 04/06/2019   Family history of cancer 04/06/2019   Lung cancer ([place]) 09/28/2018   Agatston coronary artery calcium score greater than 400    Fatty liver 08/29/2018   Aortic atherosclerosis ([place]) - per CT 05/2017 08/06/2017   Genetic testing 04/24/2017   Hot flashes 11/12/2016   Obesity (BMI 30.0-34.9) 11/12/2016   COPD mixed type ([place]) 06/05/2016   Costochondritis 03/23/2015   Hx of adenomatous colonic polyps 01/23/2015   Medication management 11/21/2014   Depression    Hyperlipidemia    Other abnormal glucose (prediabetes)    Vitamin D deficiency    Former smoker    Seasonal and perennial allergic rhinitis 07/03/2013    Screening Tests Immunization History  Administered Date(s) Administered   Fluad Quad(high Dose 65+) 08/07/2021  Influenza Split 05/16/2013, 05/12/2014, 05/14/2015   Influenza, High Dose Seasonal PF 05/15/2017, 05/07/2018, 03/29/2019, 03/26/2022   Influenza, Seasonal, Injecte, Preservative Fre 05/13/2016   Influenza,inj,quad, With Preservative 05/07/2018   Influenza-Unspecified 07/23/2020   PFIZER(Purple Top)SARS-COV-2 Vaccination 10/01/2019, 10/22/2019, 05/17/2020   Pneumococcal Conjugate-13 11/12/2016   Pneumococcal Polysaccharide-23 06/14/2011   Rsv, Bivalent, Protein Subunit Rsvpref,pf [place]) 03/26/2022   Tdap 03/27/2010   Unspecified SARS-COV-2 Vaccination 10/01/2019, 10/22/2019   Zoster, Live 04/14/2012   Tetanus: 2011 due, will get PRN Pneumovax:  2012 Prevnar 13: 2018 Flu vaccine: 04/2022 RSV vaccine:  04/2022 Zostavax: 2013 COVID: 2/2, 2021 Shingrix 2021 has gotten both at pharmacy -  need documentation  MGM: 07/2022 diagnostic, requested prior screening mammogram from GYN DEXA: 07/2022 -1.3 T Score - Osteopenia PAP getting by GYN, vulvar leasion Colonoscopy: 05/2020 due in 3 years, 5 polyps,Dr. Carlean Purl Due 03/2023  PFT  08/2018 CT chest 03/2019 Cath 08/2018  Names of Other Physician/Practitioners you currently use: 1. [facility] here for primary care 2. Dr. Dorena Cookey, eye doctor, last visit 2023 3. Dr. Luretha Rued, dentist [phone] 4. Dr. Ronnald Ramp derm, last visit 03/2022 for total body check   Patient Care Team: [name], MD as PCP - General (Internal Medicine) Troy Sine, MD as PCP - Cardiology (Cardiology) Izora Gala, MD as Consulting Physician (Otolaryngology) Delila Pereyra, MD as Consulting Physician (Gynecology) Gatha Mayer, MD as Consulting Physician (Gastroenterology) Maple Hudson, MD as Referring Physician (Surgery)  SURGICAL HISTORY She  has a past surgical history that includes Shoulder arthroscopy w/ rotator cuff repair (Left, 2012); Tendon repair (Left, 10/2009); Shoulder arthroscopy (Right, 2003); Salpingoophorectomy (Right, 1999); Sinus exploration (12/2013); Vaginal hysterectomy (1978); laparoscopic appendectomy (N/A, 10/31/2014); Appendectomy; Colonoscopy; Cyst excision (Right, 01/11/2016); Arthrotomy (Right, 01/11/2016); LEFT HEART CATH AND CORONARY ANGIOGRAPHY (N/A, 09/06/2018); Video bronchoscopy (N/A, 09/28/2018); Video assisted thoracoscopy (vats)/wedge resection (Left, 09/28/2018); Ganglion cyst excision (Right, 02/21/2020); Polypectomy; and Breast lumpectomy with radioactive seed localization (Right, 08/01/2020). FAMILY HISTORY Her family history includes Alzheimer's disease in her father; Arthritis in her paternal grandmother; Cancer in an other family member; Colon cancer in an other family member; Colon cancer (age of onset: 41) in some other family members; Colon polyps in her father; Diabetes in her mother; Epilepsy in her sister; Hyperlipidemia in her maternal grandfather; Melanoma (age of onset: 33) in her paternal grandmother; Skin cancer (age of onset: 65) in her mother; Stroke in her maternal grandmother. SOCIAL HISTORY She  reports that  she quit smoking about 3 years ago. Her smoking use included cigarettes. She has a 50.00 pack-year smoking history. She has never used smokeless tobacco. She reports that she does not drink alcohol and does not use drugs.   Review of Systems  Constitutional: Negative.   HENT: Negative.  Negative for congestion and sinus pain.   Eyes: Negative.   Respiratory:  Negative for cough, hemoptysis, sputum production, shortness of breath and wheezing.   Cardiovascular: Negative.  Negative for chest pain and leg swelling.  Gastrointestinal:  Negative for abdominal pain, blood in stool, constipation, diarrhea, heartburn, melena, nausea and vomiting.  Genitourinary: Negative.   Musculoskeletal:  Negative for back pain, falls, joint pain, myalgias and neck pain.  Skin: Negative.  Negative for rash.  Neurological: Negative.  Negative for dizziness, tingling and sensory change.  Endo/Heme/Allergies: Negative.   Psychiatric/Behavioral:  Negative for depression, hallucinations, memory loss, substance abuse and suicidal ideas. The patient is not nervous/anxious and does not have insomnia.  Objective:     Today's Vitals   08/07/22 0906  BP: 112/80  Pulse: 76  Resp: 17  Temp: 97.9 F (36.6 C)  SpO2: 96%  Weight: 179 lb 6.4 oz (81.4 kg)  Height: 5\' 4"  (1.626 m)   Body mass index is 30.79 kg/m.  General appearance: alert, no distress, WD/WN, female HEENT: normocephalic, sclerae anicteric, TMs pearly, nares patent, no discharge or erythema, pharynx normal Oral cavity: MMM, no lesions Neck: supple, no lymphadenopathy, no thyromegaly, no masses Heart: RRR, normal S1, S2, 1/6 systolic murmur Lungs: CTA bilaterally, no wheezes, rhonchi, or rales Abdomen: +bs, soft, non-tender, non distended, no masses, no hepatomegaly, no splenomegaly Musculoskeletal: nontender, no swelling, no obvious deformity Extremities: no edema, no cyanosis, no clubbing Pulses: 2+ symmetric, upper and lower extremities,  normal cap refill Neurological: alert, oriented x 3, CN2-12 intact, strength normal upper extremities and lower extremities, sensation normal throughout, DTRs 2+ throughout, no cerebellar signs, gait normal Psychiatric: normal affect, behavior normal, pleasant  Breast: R medial breast with well healing surgical scar without erythema or discharge  EKG: recently had 03/27/22 reviewed sins bradycardia.  No new concerns today - defer.   Darrol Jump, NP   08/07/2022

## 2022-08-07 NOTE — Patient Instructions (Signed)

## 2022-08-08 LAB — CBC WITH DIFFERENTIAL/PLATELET
Absolute Monocytes: 381 cells/uL (ref 200–950)
Basophils Absolute: 73 cells/uL (ref 0–200)
Basophils Relative: 1.3 %
Eosinophils Absolute: 140 cells/uL (ref 15–500)
Eosinophils Relative: 2.5 %
HCT: 43.2 % (ref 35.0–45.0)
Hemoglobin: 14.2 g/dL (ref 11.7–15.5)
Lymphs Abs: 1523 cells/uL (ref 850–3900)
MCH: 29.2 pg (ref 27.0–33.0)
MCHC: 32.9 g/dL (ref 32.0–36.0)
MCV: 88.9 fL (ref 80.0–100.0)
MPV: 9.9 fL (ref 7.5–12.5)
Monocytes Relative: 6.8 %
Neutro Abs: 3483 cells/uL (ref 1500–7800)
Neutrophils Relative %: 62.2 %
Platelets: 229 10*3/uL (ref 140–400)
RBC: 4.86 10*6/uL (ref 3.80–5.10)
RDW: 12.9 % (ref 11.0–15.0)
Total Lymphocyte: 27.2 %
WBC: 5.6 10*3/uL (ref 3.8–10.8)

## 2022-08-08 LAB — MICROALBUMIN / CREATININE URINE RATIO
Creatinine, Urine: 77 mg/dL (ref 20–275)
Microalb Creat Ratio: 5 mcg/mg creat (ref <30)
Microalb, Ur: 0.4 mg/dL

## 2022-08-08 LAB — COMPLETE METABOLIC PANEL WITH GFR
AG Ratio: 1.6 (calc) (ref 1.0–2.5)
ALT: 19 U/L (ref 6–29)
AST: 25 U/L (ref 10–35)
Albumin: 4.5 g/dL (ref 3.6–5.1)
Alkaline phosphatase (APISO): 72 U/L (ref 37–153)
BUN: 11 mg/dL (ref 7–25)
CO2: 22 mmol/L (ref 20–32)
Calcium: 9.9 mg/dL (ref 8.6–10.4)
Chloride: 107 mmol/L (ref 98–110)
Creat: 0.83 mg/dL (ref 0.60–1.00)
Globulin: 2.8 g/dL (calc) (ref 1.9–3.7)
Glucose, Bld: 103 mg/dL — ABNORMAL HIGH (ref 65–99)
Potassium: 5 mmol/L (ref 3.5–5.3)
Sodium: 141 mmol/L (ref 135–146)
Total Bilirubin: 0.3 mg/dL (ref 0.2–1.2)
Total Protein: 7.3 g/dL (ref 6.1–8.1)
eGFR: 75 mL/min/{1.73_m2} (ref >=60)

## 2022-08-08 LAB — LIPID PANEL
Cholesterol: 170 mg/dL (ref <200)
HDL: 79 mg/dL (ref >=50)
LDL Cholesterol (Calc): 74 mg/dL (calc)
Non-HDL Cholesterol (Calc): 91 mg/dL (calc) (ref <130)
Total CHOL/HDL Ratio: 2.2 (calc) (ref <5.0)
Triglycerides: 87 mg/dL (ref <150)

## 2022-08-08 LAB — VITAMIN D 25 HYDROXY (VIT D DEFICIENCY, FRACTURES): Vit D, 25-Hydroxy: 44 ng/mL (ref 30–100)

## 2022-08-08 LAB — HEMOGLOBIN A1C
Hgb A1c MFr Bld: 6.4 % of total Hgb — ABNORMAL HIGH (ref <5.7)
Mean Plasma Glucose: 137 mg/dL
eAG (mmol/L): 7.6 mmol/L

## 2022-08-08 LAB — URINALYSIS, ROUTINE W REFLEX MICROSCOPIC
Bilirubin Urine: NEGATIVE
Glucose, UA: NEGATIVE
Hgb urine dipstick: NEGATIVE
Ketones, ur: NEGATIVE
Leukocytes,Ua: NEGATIVE
Nitrite: NEGATIVE
Protein, ur: NEGATIVE
Specific Gravity, Urine: 1.015 (ref 1.001–1.035)
pH: <=5 (ref 5.0–8.0)

## 2022-08-08 LAB — VITAMIN B12: Vitamin B-12: 445 pg/mL (ref 200–1100)

## 2022-08-08 LAB — MAGNESIUM: Magnesium: 2.4 mg/dL (ref 1.5–2.5)

## 2022-08-08 LAB — TSH: TSH: 1.3 mIU/L (ref 0.40–4.50)

## 2022-08-08 LAB — INSULIN, RANDOM: Insulin: 41.2 u[IU]/mL — ABNORMAL HIGH

## 2022-10-01 DIAGNOSIS — M180 Bilateral primary osteoarthritis of first carpometacarpal joints: Secondary | ICD-10-CM | POA: Diagnosis not present

## 2022-10-01 DIAGNOSIS — R2231 Localized swelling, mass and lump, right upper limb: Secondary | ICD-10-CM | POA: Diagnosis not present

## 2022-10-17 NOTE — Progress Notes (Unsigned)
HPI female smoker followed for allergic rhinitis, recurrent acute bronchitis, tobacco use Allergy Profile 06/16/13 Total IgE 41.7   Pos dust mite, dog, [place] grass, ragwee Office Spirometry 06/13/2015-within normal limits. FVC 3.55/117%, FEV1 2.64/111%, FEV1/FVC 0.74 PFT 10/31/2013-within normal limits with no response to bronchodilator. FVC 3.91/120%, FEV1 3.08/123%, FEV1/FEC 0.79, FEF 25-75% 2.90/129%. TLC 113%, DLCO 86%. 6 minute walk test 10/31/2013-94%, 97%, 97%, 480 m. Normal oxygenation. Office Spirometry 06/13/2015-within normal limits. FVC 3.55/117%, FEV1 2.64/111%, FEV1/FVC 0.74 PFT 09/21/2018- minimal obstruction, insig resp to BD. FVC 3.51/ 113%, FEV1 2.69/ 113%, R .77, TLC 118%, DLCO 96% ----------------------------------------------------------------------------------------------  10/17/21- 72 year old female former (quit 2020) smoker followed for allergic rhinitis, COPD, tobacco use, LUL VATS 09/28/18 for non-SCCa/ Dr Servando Snare, CAD, Aortic Atherosclerosis, GERD, DDD-cervical/lumbar, Obesity -Neb albuterol, Ventolin hfa, Singulair,   Anoro,           Pred taper 08/15/21 Covid vax-3 Phizer Flu vax-had Says Flu shot made her quite sick for several days, now resolved. Otherwise stable without acute event. Needs meds refilled.  10/20/22- 72 year old female former (quit 2020) smoker followed for allergic rhinitis, COPD, tobacco use, LUL VATS 09/28/18 for non-SCCa/ Dr Servando Snare, CAD, Aortic Atherosclerosis, GERD, DDD-cervical/lumbar, Obesity -Neb albuterol, Ventolin hfa, Singulair,   Anoro,          Covid vax-3 Phizer Flu vax-had -----Occ SOB with exertion, overall pt has been doing pretty well She has had a persistent sensation of lump/pressure left parasternal area more or less constant over the last month or so.  Not much affected by her inhalers.  Little cough.  No distinct exacerbations of her breathing.  Does not notice palpitation or obvious angina. She has considerable social stresses  related to care for elderly family including Alzheimer's.  There is also court custody battle concerning her grandson. She says she has had no sense of taste or smell since she woke from her left upper lobe surgery in 2020. CXR 10/20/21- IMPRESSION: No active cardiopulmonary disease.  ROS-see HPI + = positive Constitutional:   No-   weight loss, night sweats, fevers, chills, fatigue, lassitude. HEENT:   +headaches, difficulty swallowing, tooth/dental problems, sore throat,       sneezing,no- itching, ear ache, +nasal congestion, +post nasal drip,  CV:  No-   chest pain, orthopnea, PND, swelling in lower extremities, anasarca,  dizziness, palpitations Resp: No-   shortness of breath with exertion or at rest.             productive cough, +non-productive cough,  No- coughing up of blood.              No-   change in color of mucus.  No- wheezing Skin: No-   rash or lesions. GI:  No-   heartburn, indigestion, abdominal pain, nausea, vomiting,  GU:  MS:  No-   joint pain or swelling.   Neuro-     nothing unusual Psych:  No- change in mood or affect. No depression or anxiety.  No memory loss.  OBJ- Physical Exam General- Alert, Oriented, Affect-appropriate, Distress- none acute,  + overweight Skin- rash-none, lesions- none, excoriation- none Lymphadenopathy- none Head- atraumatic            Eyes- Gross vision intact, PERRLA, conjunctivae and secretions clear            Ears- Hearing, canals-normal            Nose- Clear, no-Septal dev, mucus, polyps, erosion, perforation  Throat- Mallampati II , mucosa clear , drainage- none, tonsils- atrophic Neck- flexible , trachea midline, no stridor , thyroid nl, carotid no bruit Chest - symmetrical excursion , unlabored           Heart/CV- RRR , no murmur , no gallop  , no rub, nl s1 s2                           - JVD- none , edema- none, stasis changes- none, varices- none           Lung- + clear, wheeze -none, cough- none ,  dullness-none, rub- none           Chest wall-  Abd-  Br/ Gen/ Rectal- Not done, not indicated Extrem- cyanosis- none, clubbing, none, atrophy- none, strength- nl Neuro- grossly intact to observation

## 2022-10-20 ENCOUNTER — Ambulatory Visit (INDEPENDENT_AMBULATORY_CARE_PROVIDER_SITE_OTHER): Payer: Medicare HMO

## 2022-10-20 ENCOUNTER — Encounter: Payer: Self-pay | Admitting: Internal Medicine

## 2022-10-20 ENCOUNTER — Ambulatory Visit: Payer: Medicare HMO | Admitting: Internal Medicine

## 2022-10-20 VITALS — BP 122/82 | HR 65 | Ht 65.0 in | Wt 181.6 lb

## 2022-10-20 DIAGNOSIS — R430 Anosmia: Secondary | ICD-10-CM

## 2022-10-20 DIAGNOSIS — J449 Chronic obstructive pulmonary disease, unspecified: Secondary | ICD-10-CM

## 2022-10-20 NOTE — Assessment & Plan Note (Signed)
She says she has had no sense of taste or smell since she woke from anesthesia at the time of her VATS procedure in 2020.  I am not sure how these would be related unless it had something to do with anesthesia/intubation. Consider ENT evaluation.

## 2022-10-20 NOTE — Assessment & Plan Note (Signed)
Stable dyspnea.  Unclear what the pressure sensation is left parasternal area but I suspect it may reflect stress more than cardiopulmonary status. Plan-update CXR, okay to try using her nebulizer, samples of Trelegy 100 to try instead of Anoro

## 2022-10-20 NOTE — Patient Instructions (Signed)
Order- CXR     dx COPD, hx LUL resection  Order- sample x 2 trelegy 100    inhale 1 puff then rinse mouth, once daily Try this instead of Anoro. When you run out of the samples, go back to Anoro for comparison.

## 2022-10-30 DIAGNOSIS — R2231 Localized swelling, mass and lump, right upper limb: Secondary | ICD-10-CM | POA: Diagnosis not present

## 2022-10-30 DIAGNOSIS — M659 Synovitis and tenosynovitis, unspecified: Secondary | ICD-10-CM | POA: Diagnosis not present

## 2022-11-18 ENCOUNTER — Other Ambulatory Visit: Payer: Self-pay | Admitting: Internal Medicine

## 2022-12-01 ENCOUNTER — Telehealth: Payer: Self-pay | Admitting: Nurse Practitioner

## 2022-12-01 NOTE — Telephone Encounter (Signed)
patient called to request referral to [facility]. Patient states she is having memory issues; discussed this with you at last office visit.

## 2022-12-22 NOTE — Progress Notes (Signed)
HPI female smoker followed for allergic rhinitis, recurrent acute bronchitis, tobacco use Allergy Profile 06/16/13 Total IgE 41.7   Pos dust mite, dog, [place] grass, ragwee Office Spirometry 06/13/2015-within normal limits. FVC 3.55/117%, FEV1 2.64/111%, FEV1/FVC 0.74 PFT 10/31/2013-within normal limits with no response to bronchodilator. FVC 3.91/120%, FEV1 3.08/123%, FEV1/FEC 0.79, FEF 25-75% 2.90/129%. TLC 113%, DLCO 86%. 6 minute walk test 10/31/2013-94%, 97%, 97%, 480 m. Normal oxygenation. Office Spirometry 06/13/2015-within normal limits. FVC 3.55/117%, FEV1 2.64/111%, FEV1/FVC 0.74 PFT 09/21/2018- minimal obstruction, insig resp to BD. FVC 3.51/ 113%, FEV1 2.69/ 113%, R .77, TLC 118%, DLCO 96% ----------------------------------------------------------------------------------------------   10/20/22- 72 year old female former (quit 2020) smoker followed for allergic rhinitis, COPD, tobacco use, LUL VATS 09/28/18 for non-SCCa/ Dr Tyrone Sage, CAD, Aortic Atherosclerosis, GERD, DDD-cervical/lumbar, Obesity -Neb albuterol, Ventolin hfa, Singulair,   Anoro,          Covid vax-3 Phizer Flu vax-had -----Occ SOB with exertion, overall pt has been doing pretty well She has had a persistent sensation of lump/pressure left parasternal area more or less constant over the last month or so.  Not much affected by her inhalers.  Little cough.  No distinct exacerbations of her breathing.  Does not notice palpitation or obvious angina. She has considerable social stresses related to care for elderly family including Alzheimer's.  There is also court custody battle concerning her grandson. She says she has had no sense of taste or smell since she woke from her left upper lobe surgery in 2020. CXR 10/20/21- IMPRESSION: No active cardiopulmonary disease.  12/23/22- 72 year old female former (quit 2020) smoker followed for allergic rhinitis, COPD, hx tobacco use, LUL VATS 09/28/18 for non-SCCa/ Dr Tyrone Sage, CAD,  Aortic Atherosclerosis, GERD, DDD-cervical/lumbar, Obesity -Neb albuterol, Ventolin hfa, Singulair,   Anoro,          She prefers the samples of Trelegy which she tried over previous Anoro.  Currently feeling quite well with no acute problems.  Chest x-ray discussed. CXR 10/22/22- FINDINGS: The heart size and mediastinal contours are within normal limits. There is atherosclerotic calcification of the aorta. Slightly reduced lung volume is noted on the left. No consolidation, effusion, or pneumothorax. Degenerative changes are noted in the thoracic spine. No acute osseous abnormality. IMPRESSION: No active cardiopulmonary disease.   ROS-see HPI + = positive Constitutional:   No-   weight loss, night sweats, fevers, chills, fatigue, lassitude. HEENT:   +headaches, difficulty swallowing, tooth/dental problems, sore throat,       sneezing,no- itching, ear ache, +nasal congestion, +post nasal drip,  CV:  No-   chest pain, orthopnea, PND, swelling in lower extremities, anasarca,  dizziness, palpitations Resp: No-   shortness of breath with exertion or at rest.             productive cough, +non-productive cough,  No- coughing up of blood.              No-   change in color of mucus.  No- wheezing Skin: No-   rash or lesions. GI:  No-   heartburn, indigestion, abdominal pain, nausea, vomiting,  GU:  MS:  No-   joint pain or swelling.   Neuro-     nothing unusual Psych:  No- change in mood or affect. No depression or anxiety.  No memory loss.  OBJ- Physical Exam General- Alert, Oriented, Affect-appropriate, Distress- none acute,  + overweight Skin- rash-none, lesions- none, excoriation- none Lymphadenopathy- none Head- atraumatic            Eyes- Gross  vision intact, PERRLA, conjunctivae and secretions clear            Ears- Hearing, canals-normal            Nose- Clear, no-Septal dev, mucus, polyps, erosion, perforation             Throat- Mallampati II , mucosa clear , drainage- none,  tonsils- atrophic Neck- flexible , trachea midline, no stridor , thyroid nl, carotid no bruit Chest - symmetrical excursion , unlabored           Heart/CV- RRR , no murmur , no gallop  , no rub, nl s1 s2                           - JVD- none , edema- none, stasis changes- none, varices- none           Lung- + clear, wheeze -none, cough- none , dullness-none, rub- none           Chest wall-  Abd-  Br/ Gen/ Rectal- Not done, not indicated Extrem- cyanosis- none, clubbing, none, atrophy- none, strength- nl Neuro- grossly intact to observation

## 2022-12-23 ENCOUNTER — Encounter: Payer: Self-pay | Admitting: Internal Medicine

## 2022-12-23 ENCOUNTER — Ambulatory Visit: Payer: Medicare HMO | Admitting: Internal Medicine

## 2022-12-23 VITALS — BP 120/68 | HR 82 | Ht 64.0 in | Wt 175.8 lb

## 2022-12-23 DIAGNOSIS — J3089 Other allergic rhinitis: Secondary | ICD-10-CM

## 2022-12-23 DIAGNOSIS — J449 Chronic obstructive pulmonary disease, unspecified: Secondary | ICD-10-CM

## 2022-12-23 DIAGNOSIS — J302 Other seasonal allergic rhinitis: Secondary | ICD-10-CM | POA: Diagnosis not present

## 2022-12-23 MED ORDER — TRELEGY ELLIPTA 100-62.5-25 MCG/ACT IN AEPB: INHALATION_SPRAY | RESPIRATORY_TRACT | 4 refills | Status: DC

## 2022-12-23 NOTE — Patient Instructions (Addendum)
Script sent changing Anoro to Trelegy  Please  call if we can help

## 2023-01-01 ENCOUNTER — Ambulatory Visit (INDEPENDENT_AMBULATORY_CARE_PROVIDER_SITE_OTHER): Payer: Medicare HMO | Admitting: Nurse Practitioner

## 2023-01-01 ENCOUNTER — Encounter: Payer: Self-pay | Admitting: Nurse Practitioner

## 2023-01-01 ENCOUNTER — Encounter: Payer: Medicare HMO | Admitting: Nurse Practitioner

## 2023-01-01 VITALS — BP 98/60 | HR 61 | Temp 97.7°F | Ht 64.0 in | Wt 181.2 lb

## 2023-01-01 DIAGNOSIS — K769 Liver disease, unspecified: Secondary | ICD-10-CM | POA: Diagnosis not present

## 2023-01-01 DIAGNOSIS — J3089 Other allergic rhinitis: Secondary | ICD-10-CM | POA: Diagnosis not present

## 2023-01-01 DIAGNOSIS — R413 Other amnesia: Secondary | ICD-10-CM | POA: Diagnosis not present

## 2023-01-01 DIAGNOSIS — Z820 Family history of epilepsy and other diseases of the nervous system: Secondary | ICD-10-CM

## 2023-01-01 DIAGNOSIS — Z86010 Personal history of colonic polyps: Secondary | ICD-10-CM

## 2023-01-01 DIAGNOSIS — E66811 Obesity, class 1: Secondary | ICD-10-CM

## 2023-01-01 DIAGNOSIS — K219 Gastro-esophageal reflux disease without esophagitis: Secondary | ICD-10-CM

## 2023-01-01 DIAGNOSIS — I10 Essential (primary) hypertension: Secondary | ICD-10-CM | POA: Diagnosis not present

## 2023-01-01 DIAGNOSIS — Z860101 Personal history of adenomatous and serrated colon polyps: Secondary | ICD-10-CM

## 2023-01-01 DIAGNOSIS — C3412 Malignant neoplasm of upper lobe, left bronchus or lung: Secondary | ICD-10-CM | POA: Diagnosis not present

## 2023-01-01 DIAGNOSIS — R7309 Other abnormal glucose: Secondary | ICD-10-CM | POA: Diagnosis not present

## 2023-01-01 DIAGNOSIS — E669 Obesity, unspecified: Secondary | ICD-10-CM

## 2023-01-01 DIAGNOSIS — F3342 Major depressive disorder, recurrent, in full remission: Secondary | ICD-10-CM | POA: Diagnosis not present

## 2023-01-01 DIAGNOSIS — M1990 Unspecified osteoarthritis, unspecified site: Secondary | ICD-10-CM | POA: Diagnosis not present

## 2023-01-01 DIAGNOSIS — E538 Deficiency of other specified B group vitamins: Secondary | ICD-10-CM

## 2023-01-01 DIAGNOSIS — I700 Atherosclerosis of aorta: Secondary | ICD-10-CM

## 2023-01-01 DIAGNOSIS — J449 Chronic obstructive pulmonary disease, unspecified: Secondary | ICD-10-CM | POA: Diagnosis not present

## 2023-01-01 DIAGNOSIS — E782 Mixed hyperlipidemia: Secondary | ICD-10-CM

## 2023-01-01 DIAGNOSIS — Z79899 Other long term (current) drug therapy: Secondary | ICD-10-CM | POA: Diagnosis not present

## 2023-01-01 DIAGNOSIS — R4189 Other symptoms and signs involving cognitive functions and awareness: Secondary | ICD-10-CM

## 2023-01-01 DIAGNOSIS — J302 Other seasonal allergic rhinitis: Secondary | ICD-10-CM

## 2023-01-01 DIAGNOSIS — M1812 Unilateral primary osteoarthritis of first carpometacarpal joint, left hand: Secondary | ICD-10-CM | POA: Diagnosis not present

## 2023-01-01 DIAGNOSIS — R6889 Other general symptoms and signs: Secondary | ICD-10-CM | POA: Diagnosis not present

## 2023-01-01 DIAGNOSIS — Z0001 Encounter for general adult medical examination with abnormal findings: Secondary | ICD-10-CM

## 2023-01-01 DIAGNOSIS — E559 Vitamin D deficiency, unspecified: Secondary | ICD-10-CM | POA: Diagnosis not present

## 2023-01-01 DIAGNOSIS — Z0000 Encounter for general adult medical examination without abnormal findings: Secondary | ICD-10-CM

## 2023-01-01 DIAGNOSIS — K760 Fatty (change of) liver, not elsewhere classified: Secondary | ICD-10-CM

## 2023-01-01 NOTE — Progress Notes (Signed)
ANNUAL MEDICARE VISIT AND 3 MONTH   Assessment:   1. Encounter for Medicare annual wellness exam Due Annually Health maintenance reviewed   2. Aortic atherosclerosis (HCC) - per CT 05/2017 Discussed lifestyle modifications. Recommended diet heavy in fruits and veggies, omega 3's. Decrease consumption of animal meats, cheeses, and dairy products. Remain active and exercise as tolerated. Pulmonology following. Continue to monitor.  3. Essential hypertension Discussed DASH (Dietary Approaches to Stop Hypertension) DASH diet is lower in sodium than a typical American diet. Cut back on foods that are high in saturated fat, cholesterol, and trans fats. Eat more whole-grain foods, fish, poultry, and nuts Remain active and exercise as tolerated daily.  Monitor BP at home-Call if greater than 130/80.  Check CMP/CBC  4. COPD mixed type (HCC) Continue inhalers  Avoid triggers   5. Seasonal and perennial allergic rhinitis Continue antihistamine. Avoid triggers.  6. Malignant neoplasm of upper lobe of left lung (HCC) S/p lobectomy, continue follow up with surgeon  7. Other abnormal glucose (prediabetes) Education: Reviewed 'ABCs' of diabetes management  A1C (<7) Blood pressure (<130/80) Cholesterol (LDL <70) Continue Eye Exam yearly  Continue Dental Exam Q6 mo Discussed dietary recommendations Discussed Physical Activity recommendations  8. Fatty liver Weight loss advised Monitor LFTs Avoid alcohol and tylenol Discussed lifestyle modifications.  9. Gastroesophageal reflux disease, unspecified whether esophagitis present No suspected reflux complications (Barret/stricture). Lifestyle modification:  wt loss, avoid meals 2-3h before bedtime. Consider eliminating food triggers:  chocolate, caffeine, EtOH, acid/spicy food.   10. Liver lesion, right lobe Continue to monitor Monitor LFTs  11. Arthritis RICE Method when flared. OTC analgesic PRN as directed  12.  Osteopenia, unspecified location RICE Method when flared. OTC analgesic PRN as directed  13. Recurrent major depressive disorder, in full remission (HCC) Controlled Continue Zoloft. Reviewed relaxation techniques.  Sleep hygiene. Recommended mindfulness meditation and exercise.   Insight-oriented psychotherapy given for 16 minutes exclusively. Encouraged personality growth wand development through coping techniques and problem-solving skills. Limit/Decrease/Monitor drug/alcohol intake.    14. Mixed hyperlipidemia Discussed lifestyle modifications. Recommended diet heavy in fruits and veggies, omega 3's. Decrease consumption of animal meats, cheeses, and dairy products. Remain active and exercise as tolerated. Continue to monitor.  15. Obesity (BMI 30.0-34.9) Discussed appropriate BMI Diet modification. Physical activity. Encouraged/praised to build confidence.  16. Hx of adenomatous colonic polyps No recent flares or changes to bowels. Continue with colonoscopy screenings.  17. Cognitive decline/Family Hx of Alzheimer's  MME Fail Psychological; overstressed versus true neurological decline Discussed referral to Neurology given hx Alzheimer's disease in father.   Orders Placed This Encounter  Procedures   CBC with Differential/Platelet   COMPLETE METABOLIC PANEL WITH GFR   Magnesium   Lipid panel   Hemoglobin A1c   Insulin, random   VITAMIN D 25 Hydroxy (Vit-D Deficiency, Fractures)   Vitamin B12   Notify office for further evaluation and treatment, questions or concerns if any reported s/s fail to improve.   The patient was advised to call back or seek an in-person evaluation if any symptoms worsen or if the condition fails to improve as anticipated.   Further disposition pending results of labs. Discussed med's effects and SE's.    I discussed the assessment and treatment plan with the patient. The patient was provided an opportunity to ask questions and all were  answered. The patient agreed with the plan and demonstrated an understanding of the instructions.  Discussed med's effects and SE's. Screening labs and tests as requested with regular  follow-up as recommended.  I provided 35 minutes of face-to-face time during this encounter including counseling, chart review, and critical decision making was preformed.  Today's Plan of Care is based on a patient-centered health care approach known as shared decision making - the decisions, tests and treatments allow for patient preferences and values to be balanced with clinical evidence.    Future Appointments  Date Time Provider Department Center  08/11/2023  9:00 AM Adela Glimpse, NP [facility] None  12/24/2023  9:00 AM Waymon Budge, MD [facility] None  01/13/2024 11:30 AM Adela Glimpse, NP [facility] None    Subjective:  Karen Barrera is a 72 y.o. female who presents for AWV. She has Seasonal and perennial allergic rhinitis; Depression; Hyperlipidemia; Other abnormal glucose (prediabetes); Vitamin D deficiency; Former smoker; Medication management; Hx of adenomatous colonic polyps; Costochondritis; COPD mixed type (HCC); Hot flashes; Obesity (BMI 30.0-34.9); Genetic testing; Aortic atherosclerosis (HCC) - per CT 05/2017; Fatty liver; Agatston coronary artery calcium score greater than 400; Lung cancer (HCC); Hormone replacement therapy; Arthritis; Gastroesophageal reflux disease; Osteopenia; Primary osteoarthritis of first carpometacarpal joint of left hand; Family history of cancer; VIN III (vulvar intraepithelial neoplasia III); Memory changes; Liver lesion, right lobe; Essential hypertension; and Anosmia on their problem list.  She is continuing to take care of her aging parents (12 yo) and aunt (30 yo) along with her 41 yo grandson and 17 year old great grand-child.  She is fatigued and continues to feel forgetful.  She is concerned for increase in Alzheimer's disease given father was  diagnosed around age 78.  She has COPD, s/p left lobectomy for stage 2 non small cell carcinoma on 09/28/2018 with Dr. Tyrone Sage. She has not smoked since March 3rd 2020. Occasional dry cough but has improved, she uses albuterol to take as needed, didn't see benefit with anoro. Had CT 10/04/2020 showing no evidence of recurrent or metastatic disease, however concerned for memory changes.   Additionally recurrent lung imaging has showed hepatic steatosis and R lobe lesion, originally noted on CT 10/2014 and characterized as cyst at that time, approx 3 cm, overall fairly stable on serial imaging since that time, felt likely cyst without concerning features. MRI has been suggested if needing to characterize further.  She has allergies, has been taking zyrtec AM, benadryl PM; she reports 2 weeks of worsening congestion and sinus fullness; has tried mucinex, taking sudafed BID PRN, has tried some nasal sprays but unsure what and hasn't done consistently. Denies HA, fever/chills, sinus tenderness. She is concerned due to leaving on trip to [place] tomorrow. Dry cough with post-nasal drip.   Remote TAH. She is recently following with GYN Dr[facility]on [facility] ([facility] and Dr. Andrey Farmer for VIN III, planning q56m follow up. Still has hot flashes, but off of estrogen due to cancer.   Recently on 08/01/2020 had R lumpectomy by Dr. Magnus Ivan for 0.6 x 0.7 x 0.3 lesion was seen at the 1 o'clock position of the right breast near the nipple areolar complex seen on recent mammogram.  A biopsy of this was performed showing a complex sclerosing lesion, biopsy returned benign. Can resume routine annual mammograms through GYN.   She is on prilosec 40 mg, recently takes PRN, hasn't needed in some time.  BMI is Body mass index is 31.1 kg/m., she has been working on diet and exercise Wt Readings from Last 3 Encounters:  01/01/23 181 lb 3.2 oz (82.2 kg)  12/23/22 175 lb 12.8 oz (79.7 kg)  10/20/22 181  lb 9.6 oz (82.4 kg)   She  follows with Dr. Tresa Endo for ASCAD; Ct chest 2018 showed aortic atherosclerosis; cath 08/2018 revealed evidence for coronary calcification with mild nonobstructive CAD for which medical therapy was recommended.  Her blood pressure has been controlled at home, today their BP is BP: 98/60 She admits to not staying well hydrated.  She does not workout, she is cleaning, being active inside and watches great grandson but not doing any working out. .She denies chest pain, shortness of breath, dizziness.   She is on cholesterol medication and denies myalgias. Her cholesterol is not at goal. The cholesterol last visit was:   Lab Results  Component Value Date   CHOL 145 01/01/2023   HDL 72 01/01/2023   LDLCALC 54 01/01/2023   TRIG 106 01/01/2023   CHOLHDL 2.0 01/01/2023   She has prediabetes, has been working on diet/exericse. Denies diabetic polys. Last A1C:  Lab Results  Component Value Date   HGBA1C 6.3 (H) 01/01/2023   Last GFR: Lab Results  Component Value Date   GFRNONAA 65 12/31/2020   Patient is on Vitamin D supplement, taking 84696 IU  Lab Results  Component Value Date   VD25OH 36 01/01/2023     She has not started on a b12 supplement;  Lab Results  Component Value Date   VITAMINB12 379 01/01/2023    Medication Review:   Current Outpatient Medications (Cardiovascular):    ezetimibe (ZETIA) 10 MG tablet, Take 1 tab daily in the evening for cholesterol.   metoprolol succinate (TOPROL-XL) 25 MG 24 hr tablet, Take 1 tablet (25 mg total) by mouth daily.   rosuvastatin (CRESTOR) 40 MG tablet, Take 1 tablet (40 mg total) by mouth daily.  Current Outpatient Medications (Respiratory):    cetirizine (ZYRTEC) 10 MG tablet, Take 10 mg by mouth daily.   diphenhydrAMINE (BENADRYL) 25 MG tablet, Take 25 mg by mouth daily.   Fluticasone-Umeclidin-Vilant (TRELEGY ELLIPTA) 100-62.5-25 MCG/ACT AEPB, Inhale 1 puff then rinse mouth, once daily   albuterol (PROVENTIL) (2.5 MG/3ML) 0.083%  nebulizer solution, Take 3 mLs (2.5 mg total) by nebulization every 6 (six) hours as needed for wheezing or shortness of breath. (Patient not taking: Reported on 01/01/2023)   albuterol (VENTOLIN HFA) 108 (90 Base) MCG/ACT inhaler, INHALE 2 PUFFS BY MOUTH EVERY 6 HOURS AS NEEDED (Patient not taking: Reported on 01/01/2023)  Current Outpatient Medications (Analgesics):    aspirin EC 81 MG tablet, Take 81 mg by mouth daily.   ibuprofen (ADVIL,MOTRIN) 200 MG tablet, Take 600 mg by mouth every 8 (eight) hours as needed for headache or moderate pain.    Current Outpatient Medications (Other):    Calcium Carbonate Antacid (CALCIUM CARBONATE PO)*, Take 1,200 mg by mouth daily with breakfast.   Cholecalciferol (VITAMIN D-3) 125 MCG (5000 UT) TABS, Take 2 caps (5,000) units = 10,000 units /daily   imiquimod (ALDARA) 5 % cream, as needed.   sertraline (ZOLOFT) 100 MG tablet, TAKE 1 TABLET BY MOUTH DAILY FOR MOOD   zinc gluconate 50 MG tablet, Take 50 mg by mouth daily.   Ginkgo Biloba 40 MG TABS, Take by mouth. (Patient not taking: Reported on 01/01/2023) * These medications belong to multiple therapeutic classes and are listed under each applicable group.   Allergies  Allergen Reactions   Chantix [Varenicline] Anaphylaxis and Swelling    Makes throat swell up   Moxifloxacin Hcl In Nacl Other (See Comments)    aches    Current Problems (verified) Patient Active  Problem List   Diagnosis Date Noted   Anosmia 10/20/2022   Essential hypertension 04/07/2021   Memory changes 12/31/2020   Liver lesion, right lobe 12/31/2020   VIN III (vulvar intraepithelial neoplasia III) 05/11/2020   Primary osteoarthritis of first carpometacarpal joint of left hand 04/27/2019   Hormone replacement therapy 04/06/2019   Arthritis 04/06/2019   Gastroesophageal reflux disease 04/06/2019   Osteopenia 04/06/2019   Family history of cancer 04/06/2019   Lung cancer (HCC) 09/28/2018   Agatston coronary artery calcium  score greater than 400    Fatty liver 08/29/2018   Aortic atherosclerosis (HCC) - per CT 05/2017 08/06/2017   Genetic testing 04/24/2017   Hot flashes 11/12/2016   Obesity (BMI 30.0-34.9) 11/12/2016   COPD mixed type (HCC) 06/05/2016   Costochondritis 03/23/2015   Hx of adenomatous colonic polyps 01/23/2015   Medication management 11/21/2014   Depression    Hyperlipidemia    Other abnormal glucose (prediabetes)    Vitamin D deficiency    Former smoker    Seasonal and perennial allergic rhinitis 07/03/2013    Screening Tests Immunization History  Administered Date(s) Administered   Fluad Quad(high Dose 65+) 08/07/2021   Influenza Split 05/16/2013, 05/12/2014, 05/14/2015   Influenza, High Dose Seasonal PF 05/15/2017, 05/07/2018, 03/29/2019, 03/26/2022   Influenza, Seasonal, Injecte, Preservative Fre 05/13/2016   Influenza,inj,quad, With Preservative 05/07/2018   Influenza-Unspecified 07/23/2020   PFIZER(Purple Top)SARS-COV-2 Vaccination 10/01/2019, 10/22/2019, 05/17/2020   Pneumococcal Conjugate-13 11/12/2016   Pneumococcal Polysaccharide-23 06/14/2011   Rsv, Bivalent, Protein Subunit Rsvpref,pf (Abrysvo) 03/26/2022   Tdap 03/27/2010   Unspecified SARS-COV-2 Vaccination 10/01/2019, 10/22/2019   Zoster, Live 04/14/2012    Tetanus: 2011 due, will get PRN Pneumovax:  2012 Prevnar 13: 2018 Flu vaccine: 02/2022 Zostavax: 2013 COVID: 2/2, 2021 + booster Shingrix 2021 has gotten both at pharmacy -  need documentation  MGM: 03/2021 diagnostic, getting annual screening via GYN - Due 03/2023 DEXA: 2021  At GYN Osteopenia will get at GYN-Due 2023 PAP getting by GYN, vulvar lesion is monitored annually Colonoscopy: 05/2020 due in 3 years, 5 polyps, Dr. Leone Payor  PFT 08/2018 CT chest 09/2020 Cath 08/2018  Names of Other Physician/Practitioners you currently use: 1. [facility] here for primary care 2. Dr. Herbert Moors, eye doctor, last visit  2023 3. Dr. Jaynie Crumble, dentist 2024 4. Derm, last visit 04/2020 for total body check   Patient Care Team: Lucky Cowboy, MD as PCP - General (Internal Medicine) Lennette Bihari, MD as PCP - Cardiology (Cardiology) Serena Colonel, MD as Consulting Physician (Otolaryngology) Teodora Medici, MD as Consulting Physician (Gynecology) Iva Boop, MD as Consulting Physician (Gastroenterology) Johnston Ebbs, MD as Referring Physician (Surgery)  SURGICAL HISTORY She  has a past surgical history that includes Shoulder arthroscopy w/ rotator cuff repair (Left, 2012); Tendon repair (Left, 10/2009); Shoulder arthroscopy (Right, 2003); Salpingoophorectomy (Right, 1999); Sinus exploration (12/2013); Vaginal hysterectomy (1978); laparoscopic appendectomy (N/A, 10/31/2014); Appendectomy; Colonoscopy; Cyst excision (Right, 01/11/2016); Arthrotomy (Right, 01/11/2016); LEFT HEART CATH AND CORONARY ANGIOGRAPHY (N/A, 09/06/2018); Video bronchoscopy (N/A, 09/28/2018); Video assisted thoracoscopy (vats)/wedge resection (Left, 09/28/2018); Ganglion cyst excision (Right, 02/21/2020); Polypectomy; and Breast lumpectomy with radioactive seed localization (Right, 08/01/2020). FAMILY HISTORY Her family history includes Alzheimer's disease in her father; Arthritis in her paternal grandmother; Cancer in an other family member; Colon cancer in an other family member; Colon cancer (age of onset: 8) in some other family members; Colon polyps in her father; Diabetes in her mother; Epilepsy in her sister; Hyperlipidemia  in her maternal grandfather; Melanoma (age of onset: 61) in her paternal grandmother; Skin cancer (age of onset: 31) in her mother; Stroke in her maternal grandmother. SOCIAL HISTORY She  reports that she quit smoking about 4 years ago. Her smoking use included cigarettes. She has a 50.00 pack-year smoking history. She has never used smokeless tobacco. She reports that she does not drink alcohol and does not use  drugs.  MEDICARE WELLNESS OBJECTIVES: Physical activity:   Cardiac risk factors:   Depression/mood screen:      08/07/2021    9:35 AM  Depression screen PHQ 2/9  Decreased Interest 0  Down, Depressed, Hopeless 0  PHQ - 2 Score 0  Altered sleeping 0  Tired, decreased energy 0  Change in appetite 0  Feeling bad or failure about yourself  0  Trouble concentrating 0  Moving slowly or fidgety/restless 0  Suicidal thoughts 0  PHQ-9 Score 0  Difficult doing work/chores Not difficult at all    ADLs:     02/06/2022    2:02 PM  In your present state of health, do you have any difficulty performing the following activities:  Hearing? 0  Vision? 0  Difficulty concentrating or making decisions? 0  Walking or climbing stairs? 0  Dressing or bathing? 0  Doing errands, shopping? 0  Preparing Food and eating ? N  Using the Toilet? N  In the past six months, have you accidently leaked urine? N  Do you have problems with loss of bowel control? N  Managing your Medications? N  Managing your Finances? N  Housekeeping or managing your Housekeeping? N     Cognitive Testing  Alert? Yes  Normal Appearance?Yes  Oriented to person? Yes  Place? Yes   Time? Yes  Recall of three objects?  Yes  Can perform simple calculations? Yes  Displays appropriate judgment?Yes  Can read the correct time from a watch face?Yes  EOL planning:      Mini-Cog - 01/01/23 1431     Normal clock drawing test? no    How many words correct? 0              Review of Systems  Constitutional: Negative.  Negative for malaise/fatigue and weight loss.  HENT:  Negative for ear pain, hearing loss, sinus pain and tinnitus.        Chronically decreased smell and taste, has seen ENT  Eyes: Negative.  Negative for blurred vision and double vision.  Respiratory:  Negative for cough, hemoptysis, sputum production, shortness of breath and wheezing.   Cardiovascular: Negative.  Negative for chest pain, palpitations,  orthopnea, claudication and leg swelling.  Gastrointestinal:  Negative for abdominal pain, blood in stool, constipation, diarrhea, heartburn, melena, nausea and vomiting.  Genitourinary: Negative.   Musculoskeletal:  Negative for back pain, falls, joint pain, myalgias and neck pain.  Skin: Negative.  Negative for rash.  Neurological: Negative.  Negative for dizziness, tingling, sensory change, weakness and headaches.  Endo/Heme/Allergies:  Positive for environmental allergies. Negative for polydipsia.  Psychiatric/Behavioral:  Positive for memory loss (more difficulty with navigation, good short term recall ). Negative for depression, hallucinations, substance abuse and suicidal ideas. The patient is not nervous/anxious and does not have insomnia.   All other systems reviewed and are negative.    Objective:     Today's Vitals   01/01/23 1408  BP: 98/60  Pulse: 61  Temp: 97.7 F (36.5 C)  SpO2: 97%  Weight: 181 lb 3.2 oz (82.2 kg)  Height: 5\' 4"  (1.626 m)   Body mass index is 31.1 kg/m.  General appearance: alert, no distress, WD/WN, female HEENT: normocephalic, sclerae anicteric, TMs pearly, nares patent, no discharge or erythema, pharynx normal, no sinus tenderness Oral cavity: MMM, no lesions Neck: supple, no lymphadenopathy, no thyromegaly, no masses Heart: RRR, normal S1, S2, no murmur  Lungs: CTA bilaterally, no wheezes, rhonchi, or rales Abdomen: +bs, soft, non-tender, non distended, no masses, no hepatomegaly, no splenomegaly Musculoskeletal: nontender, no swelling, no obvious deformity Extremities: no edema, no cyanosis, no clubbing Pulses: 2+ symmetric, upper and lower extremities, normal cap refill Neurological: alert, oriented x 3, CN2-12 intact, strength normal upper extremities and lower extremities, sensation normal throughout, DTRs 2+ throughout, no cerebellar signs, gait normal Psychiatric: normal affect, behavior normal, pleasant    Medicare Attestation I  have personally reviewed: The patient's medical and social history Their use of alcohol, tobacco or illicit drugs Their current medications and supplements The patient's functional ability including ADLs,fall risks, home safety risks, cognitive, and hearing and visual impairment Diet and physical activities Evidence for depression or mood disorders  The patient's weight, height, BMI, and visual acuity have been recorded in the chart.  I have made referrals, counseling, and provided education to the patient based on review of the above and I have provided the patient with a written personalized care plan for preventive services.     Adela Glimpse, NP   01/05/2023

## 2023-01-02 LAB — MAGNESIUM: Magnesium: 2.1 mg/dL (ref 1.5–2.5)

## 2023-01-02 LAB — COMPLETE METABOLIC PANEL WITH GFR
AG Ratio: 1.8 (calc) (ref 1.0–2.5)
ALT: 14 U/L (ref 6–29)
AST: 20 U/L (ref 10–35)
Albumin: 4.4 g/dL (ref 3.6–5.1)
Alkaline phosphatase (APISO): 64 U/L (ref 37–153)
BUN: 14 mg/dL (ref 7–25)
CO2: 27 mmol/L (ref 20–32)
Calcium: 9.7 mg/dL (ref 8.6–10.4)
Chloride: 105 mmol/L (ref 98–110)
Creat: 0.88 mg/dL (ref 0.60–1.00)
Globulin: 2.5 g/dL (calc) (ref 1.9–3.7)
Glucose, Bld: 95 mg/dL (ref 65–99)
Potassium: 4.5 mmol/L (ref 3.5–5.3)
Sodium: 141 mmol/L (ref 135–146)
Total Bilirubin: 0.4 mg/dL (ref 0.2–1.2)
Total Protein: 6.9 g/dL (ref 6.1–8.1)
eGFR: 70 mL/min/{1.73_m2} (ref >=60)

## 2023-01-02 LAB — INSULIN, RANDOM: Insulin: 43.6 u[IU]/mL — ABNORMAL HIGH

## 2023-01-02 LAB — HEMOGLOBIN A1C
Hgb A1c MFr Bld: 6.3 % of total Hgb — ABNORMAL HIGH (ref <5.7)
Mean Plasma Glucose: 134 mg/dL
eAG (mmol/L): 7.4 mmol/L

## 2023-01-02 LAB — LIPID PANEL
Cholesterol: 145 mg/dL (ref <200)
HDL: 72 mg/dL (ref >=50)
LDL Cholesterol (Calc): 54 mg/dL (calc)
Non-HDL Cholesterol (Calc): 73 mg/dL (calc) (ref <130)
Total CHOL/HDL Ratio: 2 (calc) (ref <5.0)
Triglycerides: 106 mg/dL (ref <150)

## 2023-01-02 LAB — CBC WITH DIFFERENTIAL/PLATELET
Absolute Monocytes: 541 cells/uL (ref 200–950)
Basophils Absolute: 73 cells/uL (ref 0–200)
Basophils Relative: 1.1 %
Eosinophils Absolute: 99 cells/uL (ref 15–500)
Eosinophils Relative: 1.5 %
HCT: 41.3 % (ref 35.0–45.0)
Hemoglobin: 13.5 g/dL (ref 11.7–15.5)
Lymphs Abs: 2079 cells/uL (ref 850–3900)
MCH: 28.8 pg (ref 27.0–33.0)
MCHC: 32.7 g/dL (ref 32.0–36.0)
MCV: 88.2 fL (ref 80.0–100.0)
MPV: 10.3 fL (ref 7.5–12.5)
Monocytes Relative: 8.2 %
Neutro Abs: 3808 cells/uL (ref 1500–7800)
Neutrophils Relative %: 57.7 %
Platelets: 192 10*3/uL (ref 140–400)
RBC: 4.68 10*6/uL (ref 3.80–5.10)
RDW: 13.1 % (ref 11.0–15.0)
Total Lymphocyte: 31.5 %
WBC: 6.6 10*3/uL (ref 3.8–10.8)

## 2023-01-02 LAB — VITAMIN B12: Vitamin B-12: 379 pg/mL (ref 200–1100)

## 2023-01-02 LAB — VITAMIN D 25 HYDROXY (VIT D DEFICIENCY, FRACTURES): Vit D, 25-Hydroxy: 36 ng/mL (ref 30–100)

## 2023-01-05 NOTE — Patient Instructions (Signed)

## 2023-02-11 ENCOUNTER — Encounter: Payer: Self-pay | Admitting: Internal Medicine

## 2023-02-11 NOTE — Assessment & Plan Note (Signed)
Doing well Plan-change Anoro to Trelegy 100

## 2023-02-11 NOTE — Assessment & Plan Note (Signed)
Not a significant problem so far this spring.  Can use Flonase and antihistamines if needed.

## 2023-02-16 ENCOUNTER — Encounter: Payer: Self-pay | Admitting: Nurse Practitioner

## 2023-02-17 ENCOUNTER — Ambulatory Visit
Admission: RE | Admit: 2023-02-17 | Discharge: 2023-02-17 | Disposition: A | Discharge status: Home / Self Care | Payer: Medicare HMO | Source: Ambulatory Visit | Attending: Nurse Practitioner | Admitting: Nurse Practitioner

## 2023-02-17 DIAGNOSIS — R4189 Other symptoms and signs involving cognitive functions and awareness: Secondary | ICD-10-CM | POA: Diagnosis not present

## 2023-02-17 DIAGNOSIS — C3412 Malignant neoplasm of upper lobe, left bronchus or lung: Secondary | ICD-10-CM

## 2023-02-17 DIAGNOSIS — Z820 Family history of epilepsy and other diseases of the nervous system: Secondary | ICD-10-CM

## 2023-02-17 MED ORDER — GADOPICLENOL 0.5 MMOL/ML IV SOLN
6.0000 mL | Freq: Once | INTRAVENOUS | Status: AC | PRN
  Administered 2023-02-17: 6 mL via INTRAVENOUS

## 2023-03-04 DIAGNOSIS — H524 Presbyopia: Secondary | ICD-10-CM | POA: Diagnosis not present

## 2023-03-04 DIAGNOSIS — H5213 Myopia, bilateral: Secondary | ICD-10-CM | POA: Diagnosis not present

## 2023-04-10 ENCOUNTER — Encounter: Payer: Self-pay | Admitting: Nurse Practitioner

## 2023-04-10 ENCOUNTER — Ambulatory Visit (INDEPENDENT_AMBULATORY_CARE_PROVIDER_SITE_OTHER): Payer: Medicare HMO | Admitting: Nurse Practitioner

## 2023-04-10 VITALS — BP 122/78 | HR 63 | Temp 97.7°F | Ht 64.0 in | Wt 177.6 lb

## 2023-04-10 DIAGNOSIS — I679 Cerebrovascular disease, unspecified: Secondary | ICD-10-CM | POA: Diagnosis not present

## 2023-04-10 DIAGNOSIS — I700 Atherosclerosis of aorta: Secondary | ICD-10-CM | POA: Diagnosis not present

## 2023-04-10 DIAGNOSIS — R413 Other amnesia: Secondary | ICD-10-CM | POA: Diagnosis not present

## 2023-04-10 DIAGNOSIS — R4189 Other symptoms and signs involving cognitive functions and awareness: Secondary | ICD-10-CM

## 2023-04-10 DIAGNOSIS — R0683 Snoring: Secondary | ICD-10-CM | POA: Diagnosis not present

## 2023-04-10 DIAGNOSIS — G479 Sleep disorder, unspecified: Secondary | ICD-10-CM

## 2023-04-10 DIAGNOSIS — E782 Mixed hyperlipidemia: Secondary | ICD-10-CM | POA: Diagnosis not present

## 2023-04-10 NOTE — Progress Notes (Addendum)
Assessment and Plan:  Karen Barrera was seen today for an episodic visit.  Diagnoses and all order for this visit:  Small vessel disease Continue referral to [facility]  Memory Changes/Cognitive decline Discussed ways to increase memory including memory aids, exercise, sleep, puzzles, nutrition. Reviewed MRI results - all questions an concerns addressed.  Mixed hyperlipidemia/Arthrosclerosis  Lipid panel WNL since 07/2021 Continue Zetia and Rosuvastatin Dr. Tresa Endo Cardiology Following Discussed lifestyle modifications. Recommended diet heavy in fruits and veggies, omega 3's. Decrease consumption of animal meats, cheeses, and dairy products. Remain active and exercise as tolerated. Continue to monitor.  Loud snoring/Difficulty sleeping Refer for sleep study  Orders Placed This Encounter  Procedures   Ambulatory referral to Sleep Studies    Referral Priority:   Routine    Referral Type:   Consultation    Referral Reason:   Specialty Services Required    Requested Specialty:   Sleep Medicine    Number of Visits Requested:   1   Notify office for further evaluation and treatment, questions or concerns if s/s fail to improve. The risks and benefits of my recommendations, as well as other treatment options were discussed with the patient today. Questions were answered.  Further disposition pending results of labs. Discussed med's effects and SE's.    Over 30 minutes of exam, counseling, chart review, and critical decision making was performed.   Future Appointments  Date Time Provider Department Center  06/02/2023  1:20 PM Lennette Bihari, MD [facility] None  08/11/2023  9:00 AM Adela Glimpse, NP [facility] None  12/24/2023  9:00 AM Jetty Duhamel D, MD [facility] None  01/13/2024 11:30 AM Eiden Bagot, Archie Patten, NP [facility] None    ------------------------------------------------------------------------------------------------------------------   HPI BP  122/78   Pulse 63   Temp 97.7 F (36.5 C)   Ht 5\' 4"  (1.626 m)   Wt 177 lb 9.6 oz (80.6 kg)   SpO2 98%   BMI 30.48 kg/m   72 y.o.female presents alongside Karen Barrera to review MRI and ongoing cognitive decline and memory issues.  During her Wellness visit 01/01/23 she addressed memory concerns and changes.  She failed the MME exam.  She thought it was related to feeling overstressed having to care for family that is currently diagnosed with Alzheimer's, including father, living (43 yo).  However, she is continuing to experience fatigue and forgetfulness.  Feels as though head is foggy, hard to recall names and birthdays, she is no longer driving feeling as though she cannot remember routes.    MRI completed on 02/17/23 revealed chronic microhemorrhages in the left frontal lobe and both posterior temporal lobes. Small T2 hyperintensities in the cerebral white matter noted to be nonspecific but compatible with mild chronic small vessel ischemic disease. She also has mild cerebral atrophy without lobar predominance.  She follows with Dr. Tresa Endo for [facility]; Ct chest 2018 showed aortic atherosclerosis; cath 08/2018 revealed evidence for coronary calcification with mild nonobstructive CAD for which medical therapy was recommended.   She is also concerned for loud snoring with difficulty sleeping.  Does not feel as though she wakes herself at night but does endorse fatigue throughout the day.  Feels fatigue could be contributing to possible memory decline.  She is on cholesterol medication Ezetimibe and Rosuvastatin and denies myalgias. Her cholesterol is at goal. The cholesterol last visit was:   Lab Results  Component Value Date   CHOL 145 01/01/2023   HDL 72 01/01/2023   LDLCALC 54 01/01/2023   TRIG 106  01/01/2023   CHOLHDL 2.0 01/01/2023     Past Medical History:  Diagnosis Date   Acute perforated appendicitis 10/31/2014   Adenomatous colon polyp    Allergic rhinitis    Allergy    Anemia     Arthritis    "fingers" (10/31/2014)   Chronic bronchitis (HCC)    "got it q yr til I had my nose OR" (10/31/2014)   COPD (chronic obstructive pulmonary disease) (HCC)    Coronary artery disease    DDD (degenerative disc disease), cervical    DDD (degenerative disc disease), lumbar    Depression    "short term when my sister passed away unexpectedly"   Family history of adverse reaction to anesthesia    "mother is hard to wake up; a little goes a long way w/her"   Family history of breast cancer    Family history of colon cancer    Family history of skin cancer    GERD (gastroesophageal reflux disease)    History of stomach ulcers    Hyperlipidemia    "borderline; RX is preventative" (10/31/2014)   Kidney stones    "they passed"   Lung cancer (HCC) 09/28/2018   Migraine    "used to get them alot; don't get them anymore" (10/31/2014)   Osteoarthritis of both knees    Osteopenia    Pneumonia 2003 X 1   Pre-diabetes    Seizures (HCC) ~ 1962 X 1   "from an abscessed wisdom tooth"   Tobacco dependence    Vitamin D deficiency      Allergies  Allergen Reactions   Chantix [Varenicline] Anaphylaxis and Swelling    Makes throat swell up   Moxifloxacin Hcl In Nacl Other (See Comments)    aches    Current Outpatient Medications on File Prior to Visit  Medication Sig   albuterol (PROVENTIL) (2.5 MG/3ML) 0.083% nebulizer solution Take 3 mLs (2.5 mg total) by nebulization every 6 (six) hours as needed for wheezing or shortness of breath.   albuterol (VENTOLIN HFA) 108 (90 Base) MCG/ACT inhaler INHALE 2 PUFFS BY MOUTH EVERY 6 HOURS AS NEEDED   aspirin EC 81 MG tablet Take 81 mg by mouth daily.   Calcium Carbonate Antacid (CALCIUM CARBONATE PO) Take 1,200 mg by mouth daily with breakfast.   cetirizine (ZYRTEC) 10 MG tablet Take 10 mg by mouth daily.   Cholecalciferol (VITAMIN D-3) 125 MCG (5000 UT) TABS Take 2 caps (5,000) units = 10,000 units /daily   diphenhydrAMINE (BENADRYL) 25 MG tablet  Take 25 mg by mouth daily.   ezetimibe (ZETIA) 10 MG tablet Take 1 tab daily in the evening for cholesterol.   Fluticasone-Umeclidin-Vilant (TRELEGY ELLIPTA) 100-62.5-25 MCG/ACT AEPB Inhale 1 puff then rinse mouth, once daily   Ginkgo Biloba 40 MG TABS Take by mouth.   ibuprofen (ADVIL,MOTRIN) 200 MG tablet Take 600 mg by mouth every 8 (eight) hours as needed for headache or moderate pain.    imiquimod (ALDARA) 5 % cream as needed.   metoprolol succinate (TOPROL-XL) 25 MG 24 hr tablet Take 1 tablet (25 mg total) by mouth daily.   rosuvastatin (CRESTOR) 40 MG tablet Take 1 tablet (40 mg total) by mouth daily.   sertraline (ZOLOFT) 100 MG tablet TAKE 1 TABLET BY MOUTH DAILY FOR MOOD   zinc gluconate 50 MG tablet Take 50 mg by mouth daily.   No current facility-administered medications on file prior to visit.    ROS: all negative except what is noted in the  HPI.   Physical Exam:  BP 122/78   Pulse 63   Temp 97.7 F (36.5 C)   Ht 5\' 4"  (1.626 m)   Wt 177 lb 9.6 oz (80.6 kg)   SpO2 98%   BMI 30.48 kg/m   General Appearance: NAD.  Awake, conversant and cooperative. Eyes: PERRLA, EOMs intact.  Sclera white.  Conjunctiva without erythema. Sinuses: No frontal/maxillary tenderness.  No nasal discharge. Nares patent.  ENT/Mouth: Ext aud canals clear.  Bilateral TMs w/DOL and without erythema or bulging. Hearing intact.  Posterior pharynx without swelling or exudate.  Tonsils without swelling or erythema.  Neck: Supple.  No masses, nodules or thyromegaly. Respiratory: Effort is regular with non-labored breathing. Breath sounds are equal bilaterally without rales, rhonchi, wheezing or stridor.  Cardio: RRR with no MRGs. Brisk peripheral pulses without edema.  Abdomen: Active BS in all four quadrants.  Soft and non-tender without guarding, rebound tenderness, hernias or masses. Lymphatics: Non tender without lymphadenopathy.  Musculoskeletal: Full ROM, 5/5 strength, normal ambulation.  No  clubbing or cyanosis. Skin: Appropriate color for ethnicity. Warm without rashes, lesions, ecchymosis, ulcers.  Neuro: CN II-XII grossly normal. Normal muscle tone without cerebellar symptoms and intact sensation.   Psych: AO X 3,  appropriate mood and affect, insight and judgment.     Adela Glimpse, NP 9:28 PM [facility]

## 2023-04-14 ENCOUNTER — Telehealth: Payer: Self-pay | Admitting: Nurse Practitioner

## 2023-04-14 NOTE — Telephone Encounter (Signed)
Pt said she called neurology office, but they have no record of a  order/referral.

## 2023-04-15 ENCOUNTER — Other Ambulatory Visit: Payer: Self-pay | Admitting: Nurse Practitioner

## 2023-04-15 DIAGNOSIS — R4189 Other symptoms and signs involving cognitive functions and awareness: Secondary | ICD-10-CM

## 2023-04-15 DIAGNOSIS — R413 Other amnesia: Secondary | ICD-10-CM

## 2023-04-15 NOTE — Patient Instructions (Signed)

## 2023-04-17 ENCOUNTER — Telehealth: Payer: Self-pay | Admitting: Nurse Practitioner

## 2023-04-17 NOTE — Telephone Encounter (Signed)
Maggie wanted to know if you thought a sleep study would be good for PT. She said this was dicussed at the last appt. Pt snores a lot and just has trouble sleeping.

## 2023-04-20 ENCOUNTER — Encounter: Payer: Self-pay | Admitting: Nurse Practitioner

## 2023-04-20 NOTE — Addendum Note (Signed)
Addended by: Adela Glimpse on: 04/20/2023 02:03 PM   Modules accepted: Orders

## 2023-06-02 ENCOUNTER — Ambulatory Visit: Payer: Medicare HMO | Attending: Cardiovascular Disease | Admitting: Cardiovascular Disease

## 2023-06-02 ENCOUNTER — Encounter: Payer: Self-pay | Admitting: Cardiovascular Disease

## 2023-06-02 VITALS — BP 106/68 | HR 77 | Ht 64.0 in | Wt 182.0 lb

## 2023-06-02 DIAGNOSIS — E785 Hyperlipidemia, unspecified: Secondary | ICD-10-CM

## 2023-06-02 DIAGNOSIS — I700 Atherosclerosis of aorta: Secondary | ICD-10-CM | POA: Diagnosis not present

## 2023-06-02 DIAGNOSIS — G319 Degenerative disease of nervous system, unspecified: Secondary | ICD-10-CM | POA: Diagnosis not present

## 2023-06-02 DIAGNOSIS — I2510 Atherosclerotic heart disease of native coronary artery without angina pectoris: Secondary | ICD-10-CM

## 2023-06-02 DIAGNOSIS — I10 Essential (primary) hypertension: Secondary | ICD-10-CM

## 2023-06-02 DIAGNOSIS — C3492 Malignant neoplasm of unspecified part of left bronchus or lung: Secondary | ICD-10-CM

## 2023-06-02 DIAGNOSIS — Z87891 Personal history of nicotine dependence: Secondary | ICD-10-CM

## 2023-06-02 DIAGNOSIS — J449 Chronic obstructive pulmonary disease, unspecified: Secondary | ICD-10-CM

## 2023-06-02 NOTE — Patient Instructions (Signed)
Medication Instructions:   No changes  *If you need a refill on your cardiac medications before your next appointment, please call your pharmacy*   Lab Work: Not needed    Testing/Procedures:  Not needed  Follow-Up: At [facility], you and your health needs are our priority.  As part of our continuing mission to provide you with exceptional heart care, we have created designated Provider Care Teams.  These Care Teams include your primary Cardiologist (physician) and Advanced Practice Providers (APPs -  Physician Assistants and Nurse Practitioners) who all work together to provide you with the care you need, when you need it.     Your next appointment:   12 month(s)  The format for your next appointment:   In Person  Provider:   Bernadene Person, NP    T.

## 2023-06-02 NOTE — Progress Notes (Signed)
Cardiology Office Note    Date:  06/07/2023   ID:  Karen Barrera, DOB 07/27/1951, MRN [id number]  Pulmonary: Dr. Fannie Knee PCP:  Lucky Cowboy, MD  Cardiologist:  Nicki Guadalajara, MD   30 month F/u cardiology evaluation initially referred through the courtesy of Dr. Fannie Knee for evaluation of coronary calcification.  History of Present Illness:  Karen Barrera is a 72 y.o. female who is the daughter of my patient, Mrs. Karen Barrera.  She is followed by Dr. Oneta Rack for primary care and Dr. Maple Hudson for pulmonary.  She was initially referred for cardiology evaluation after being found to have coronary calcification.  She presents for follow-up cardiology evaluation.  [name] has a long history of tobacco use.  She started smoking at age 54 and typically has smoked 1 pack/day.  She has smoked for 57 years and stopped during her pregnancies.  She is followed by Dr. Maple Hudson for allergic rhinitis, recurrent acute bronchitis, as well as lung nodules.  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  She had recently undergone a chest CT which showed a stable left upper lobe nodule adjacent to bulla formation.  It was recommended that she undergo unenhanced chest CT in 12 months.  On her CT she was found to have coronary artery calcifications as well as a stable right hepatic cyst.  She admits to occasional episodes of left chest wall like discomfort.  Occasionally she has noticed some sporadic chest tightness but typically this has not been exertional.  Usually is short-lived and last only 2 to 3 minutes.  In addition to her longstanding smoking history, there is family history for cardiovascular disease.  Remotely, she believes she may have had a stress test in a years ago.  She has a history of a cracked sternum approximately 3 years ago.    When I initially saw her in December  2019 she denied any exertional type of chest pain.  However, with her longstanding tobacco history, and family history for CAD as well as documented coronary calcification I recommended a CT coronary angiogram as well as a 2D echo Doppler study.  We discussed the importance of smoking cessation.  In addition with her previous coronary calcification LDL cholesterol of 93 I recommended titration of rosuvastatin to 20 mg.  Her echo Doppler study revealed an EF of 55 to 60% with grade 1 diastolic dysfunction.  There is very mild dilation of the aortic root.  There were no valvular abnormalities.  Her CTA was abnormal and showed coronary artery calcium score of 891 placing her in the 97th percentile for age and gender.  She was felt to have possible 70 to 90% stenosis in the proximal LAD and mild stenosis in the proximal circumflex.  FFR analysis was abnormal in the mid LAD at 0.76 suggesting  hemodynamically significant proximal LAD stenosis.  Chest CT also revealed an interval increase in soft tissue thickening along the posterior and lateral aspect of the left upper lobe bulla and although this could represent infectious etiology an underlying neoplasm was suspected.  As result she is to undergo subsequent PET imaging.  She again was found to have aortic atherosclerosis, coronary atherosclerosis as well as emphysema.  She was also noted to have hepatosteatosis and probable enlarging posterior right hepatic lobe cyst.   When I  saw her on August 26, 2018 I recommended further titration of rosuvastatin to 40 mg and added Toprol-XL 25 mg to her medical regimen.  She underwent definitive cardiac catheterization on September 06, 2018 by me which showed moderate coronary calcification without high-grade obstructive disease.  There was a 20 to 30% proximal to mid LAD stenosis with 55% mid stenosis in the mid vessel beyond the small diagonal vessel, more significant calcification involving the proximal left circumflex  coronary artery with 20% proximal narrowing and 20% narrowing in the OM1 vessel, and 30 and 20% proximal to mid RCA stenoses.  LVEDP was 18 mm.  She had normal LV function without focal segmental wall motion abnormalities.  Medical therapy was recommended.  Smoking cessation was essential.  Since her catheterization, she was felt to have progression of a lung nodule ultimately led to bronchoscopy with left video-assisted thoracoscopy, wedge resection, and left upper lobectomy with lymph node dissection by Dr. Sharmaine Base.  Postoperative diagnosis was non-small cell lung cancer by frozen section stage I A2 (pT1b, pN 0, cMO).  She did not require any adjunctive chemotherapy.  When I saw her in June 2020 at which time she was remaining stable and was without anginal symptomatology.  She had quit smoking on September 27, 2018.  Over the last 6 months, she has continued to do well.  She specifically denies chest pain or any symptoms of exertional dyspnea.  She has seen Dr. Tyrone Sage in follow-up evaluation of her non-small cell CA. a follow-up chest CT did not show any evidence of residual or recurrent disease.  The plan is for her to follow-up CT scan of her chest in 6 months which will be 12 months postoperatively.    I last saw her on Dec 19, 2020 at which time she remained stable from a cardiovascular standpoint.  She saw Dr. Tyrone Sage on 10/04/2020 prior to his retirement.  At that time she was stable for 2 years after resection of her left upper lobe lung mass stage Ia non-small cell carcinoma of the lung, currently on observation only.  It was recommended to have a follow-up CT of the chest in 1 year.  Over the past 2 years she has had persistent loss of taste.  Of note, her surgery was the week before the hospital shutdown for COVID in March 2020.  She was  evaluated by Dr. Narda Bonds for ENT evaluation of her absence of taste.  She also has followed up with Dr. Fannie Knee on October 17, 2020.  She had follow-up  evaluation with Dr. Harlen Labs in January 2022 and total cholesterol was 166, LDL 72, HDL 74, and triglycerides 102.    Since I last saw her, she was evaluated by Anice Paganini, NP on March 27, 2022.  She continued to struggle with loss of taste.  She was without chest pain or significant shortness of breath.  Presently, she tells me that she was recently diagnosed with mild chronic small vessel ischemic disease with  cerebral atrophy on brain scan.  She sees Dr. Teresa Coombs of neurology.  She quit tobacco.  She denies any anginal symptoms.  She continues to be on aspirin 81 mg and metoprolol succinate 25 mg.  She is on rosuvastatin 40 mg daily.  She is on Trelegy Ellipta for her lung disease and takes as needed albuterol.  She underwent recent laboratory on January 01, 2023 by Adela Glimpse at [facility].  Lipids were excellent with total cholesterol 145, HDL 72, LDL 54 and triglycerides 106.  She presents for evaluation.   Past Medical History:  Diagnosis Date   Acute perforated appendicitis 10/31/2014   Adenomatous colon polyp    Allergic rhinitis    Allergy    Anemia    Arthritis    "fingers" (10/31/2014)   Chronic bronchitis (HCC)    "got it q yr til I had my nose OR" (10/31/2014)   COPD (chronic obstructive pulmonary disease) (HCC)    Coronary artery disease    DDD (degenerative disc disease), cervical    DDD (degenerative disc disease), lumbar    Depression    "short term when my sister passed away unexpectedly"   Family history of adverse reaction to anesthesia    "mother is hard to wake up; a little goes a long way w/her"   Family history of breast cancer    Family history of colon cancer    Family history of skin cancer    GERD (gastroesophageal reflux disease)    History of stomach ulcers    Hyperlipidemia    "borderline; RX is preventative" (10/31/2014)   Kidney stones    "they passed"   Lung cancer (HCC) 09/28/2018   Migraine    "used to get them alot;  don't get them anymore" (10/31/2014)   Osteoarthritis of both knees    Osteopenia    Pneumonia 2003 X 1   Pre-diabetes    Seizures (HCC) ~ 1962 X 1   "from an abscessed wisdom tooth"   Tobacco dependence    Vitamin D deficiency     Past Surgical History:  Procedure Laterality Date   APPENDECTOMY     ARTHROTOMY Right 01/11/2016   Procedure: DISTAL INTERPHALANGEAL JOINT ARTHROTOMY RIGHT INDEX FINGER;  Surgeon: Betha Loa, MD;  Location: [facility];  Service: Orthopedics;  Laterality: Right;   BREAST LUMPECTOMY WITH RADIOACTIVE SEED LOCALIZATION Right 08/01/2020   Procedure: RIGHT BREAST LUMPECTOMY WITH RADIOACTIVE SEED LOCALIZATION;  Surgeon: Abigail Miyamoto, MD;  Location: [facility] OR;  Service: General;  Laterality: Right;  LMA   COLONOSCOPY     CYST EXCISION Right 01/11/2016   Procedure: EXCISION MUCOID CYST;  Surgeon: Betha Loa, MD;  Location: [facility];  Service: Orthopedics;  Laterality: Right;   GANGLION CYST EXCISION Right 02/21/2020   Procedure: REMOVAL GANGLION OF WRIST;  Surgeon: Betha Loa, MD;  Location: [facility];  Service: Orthopedics;  Laterality: Right;   LAPAROSCOPIC APPENDECTOMY N/A 10/31/2014   Procedure: APPENDECTOMY LAPAROSCOPIC;  Surgeon: Ovidio Kin, MD;  [facility] OR;  Service: General;  Laterality: N/A;   LEFT HEART CATH AND CORONARY ANGIOGRAPHY N/A 09/06/2018   Procedure: LEFT HEART CATH AND CORONARY ANGIOGRAPHY;  Surgeon: Lennette Bihari, MD;  Location: [facility];  Service: Cardiovascular;  Laterality: N/A;   POLYPECTOMY     SALPINGOOPHORECTOMY Right 1999   "w/grapefruit-sized polyp"   SHOULDER ARTHROSCOPY Right 2003   removal spurs   SHOULDER ARTHROSCOPY W/ ROTATOR CUFF REPAIR  Left 2012   SINUS EXPLORATION  12/2013   [place]   TENDON REPAIR Left 10/2009   torn tendon @ elbow   VAGINAL HYSTERECTOMY  1978   VIDEO ASSISTED THORACOSCOPY (VATS)/WEDGE RESECTION Left 09/28/2018   Procedure: LEFT VIDEO ASSISTED  THORACOSCOPY (VATS)/Completion Left Upper lobe Lobectomy. Lymph node dissection. Intercostal nerve block.;  Surgeon: Delight Ovens, MD;  Location: [facility] OR;  Service: Thoracic;  Laterality: Left;   VIDEO BRONCHOSCOPY N/A 09/28/2018   Procedure: VIDEO BRONCHOSCOPY;  Surgeon: Delight Ovens, MD;  Location: [facility] OR;  Service: Thoracic;  Laterality: N/A;    Current Medications: Outpatient Medications Prior to Visit  Medication Sig Dispense Refill   albuterol (PROVENTIL) (2.5 MG/3ML) 0.083% nebulizer solution Take 3 mLs (2.5 mg total) by nebulization every 6 (six) hours as needed for wheezing or shortness of breath. 225 mL 3   albuterol (VENTOLIN HFA) 108 (90 Base) MCG/ACT inhaler INHALE 2 PUFFS BY MOUTH EVERY 6 HOURS AS NEEDED 26.8 g 3   aspirin EC 81 MG tablet Take 81 mg by mouth daily.     Calcium Carbonate Antacid (CALCIUM CARBONATE PO) Take 1,200 mg by mouth daily with breakfast.     cetirizine (ZYRTEC) 10 MG tablet Take 10 mg by mouth daily.     Cholecalciferol (VITAMIN D-3) 125 MCG (5000 UT) TABS Take 2 caps (5,000) units = 10,000 units /daily 30 tablet 0   diphenhydrAMINE (BENADRYL) 25 MG tablet Take 25 mg by mouth daily.     Fluticasone-Umeclidin-Vilant (TRELEGY ELLIPTA) 100-62.5-25 MCG/ACT AEPB Inhale 1 puff then rinse mouth, once daily 180 each 4   Ginkgo Biloba 40 MG TABS Take by mouth.     ibuprofen (ADVIL,MOTRIN) 200 MG tablet Take 600 mg by mouth every 8 (eight) hours as needed for headache or moderate pain.      imiquimod (ALDARA) 5 % cream as needed.     metoprolol succinate (TOPROL-XL) 25 MG 24 hr tablet Take 1 tablet (25 mg total) by mouth daily. 90 tablet 3   rosuvastatin (CRESTOR) 40 MG tablet Take 1 tablet (40 mg total) by mouth daily. 90 tablet 3   sertraline (ZOLOFT) 100 MG tablet TAKE 1 TABLET BY MOUTH DAILY FOR MOOD 90 tablet 3   zinc gluconate 50 MG tablet Take 50 mg by mouth daily.     ezetimibe (ZETIA) 10 MG tablet Take 1 tab daily in the evening for cholesterol. 90  tablet 3   No facility-administered medications prior to visit.     Allergies:   Chantix [varenicline] and Moxifloxacin hcl in nacl   Social History   Socioeconomic History   Marital status: Married    Spouse name: Not on file   Number of children: Not on file   Years of education: Not on file   Highest education level: Not on file  Occupational History   Occupation: Retired  Tobacco Use   Smoking status: Former    Current packs/day: 0.00    Average packs/day: 1 pack/day for 50.0 years (50.0 ttl pk-yrs)    Types: Cigarettes    Start date: 09/26/1968    Quit date: 09/27/2018    Years since quitting: 4.6   Smokeless tobacco: Never   Tobacco comments:    Quit 09/27/2018  Vaping Use   Vaping status: Former   Substances: Nicotine  Substance and Sexual Activity   Alcohol use: No    Alcohol/week: 0.0 standard drinks of alcohol   Drug use: No   Sexual activity: Not Currently  Partners: Male    Birth control/protection: Post-menopausal  Other Topics Concern   Not on file  Social History Narrative   Not on file   Social Determinants of Health   Financial Resource Strain: Not on file  Food Insecurity: Not on file  Transportation Needs: Not on file  Physical Activity: Not on file  Stress: Not on file  Social Connections: Not on file    Social history is notable in that she is married for 50 years.  She has 2 children ages 49 and 14.  One child was stillborn at 7 months.  She is retired.  She has a BS in business and marketing.  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  She does not routinely exercise.  Family History:  The patient's family history includes Alzheimer's disease in her father; Arthritis in her paternal grandmother; Cancer in an other family member; Colon cancer in an other family member; Colon cancer (age of onset: 60) in some other family members; Colon polyps in her father; Diabetes in her mother; Epilepsy in her sister;  Hyperlipidemia in her maternal grandfather; Melanoma (age of onset: 51) in her paternal grandmother; Skin cancer (age of onset: 42) in her mother; Stroke in her maternal grandmother.   ROS General: Negative; No fevers, chills, or night sweats;  HEENT: Negative; No changes in vision or hearing, sinus congestion, difficulty swallowing Pulmonary: COPD, lung nodule Cardiovascular: See HPI GI: Negative; No nausea, vomiting, diarrhea, or abdominal pain GU: Negative; No dysuria, hematuria, or difficulty voiding Musculoskeletal: Negative; no myalgias, joint pain, or weakness Hematologic/Oncology: Negative; no easy bruising, bleeding Endocrine: Negative; no heat/cold intolerance; no diabetes Neuro: Negative; no changes in balance, headaches Skin: Negative; No rashes or skin lesions Psychiatric: Negative; No behavioral problems, depression Sleep: Negative; No snoring, daytime sleepiness, hypersomnolence, bruxism, restless legs, hypnogognic hallucinations, no cataplexy Other comprehensive 14 point system review is negative.   PHYSICAL EXAM:   VS:  BP 106/68 (BP Location: Left Arm, Patient Position: Sitting, Cuff Size: Normal)   Pulse 77   Ht 5\' 4"  (1.626 m)   Wt 182 lb (82.6 kg)   BMI 31.24 kg/m    Repeat blood pressure was 100/62  Wt Readings from Last 3 Encounters:  06/02/23 182 lb (82.6 kg)  04/10/23 177 lb 9.6 oz (80.6 kg)  01/01/23 181 lb 3.2 oz (82.2 kg)    General: Alert, oriented, no distress.  Skin: normal turgor, no rashes, warm and dry HEENT: Normocephalic, atraumatic. Pupils equal round and reactive to light; sclera anicteric; extraocular muscles intact;  Nose without nasal septal hypertrophy Mouth/Parynx benign; Mallinpatti scale 3 Neck: No JVD, no carotid bruits; normal carotid upstroke Lungs: clear to ausculatation and percussion; no wheezing or rales Chest wall: without tenderness to palpitation Heart: PMI not displaced, RRR, s1 s2 normal, 1/6 systolic murmur, no  diastolic murmur, no rubs, gallops, thrills, or heaves Abdomen: soft, nontender; no hepatosplenomehaly, BS+; abdominal aorta nontender and not dilated by palpation. Back: no CVA tenderness Pulses 2+ Musculoskeletal: full range of motion, normal strength, no joint deformities Extremities: no clubbing cyanosis or edema, Homan's sign negative  Neurologic: grossly nonfocal; Cranial nerves grossly wnl Psychologic: Normal mood and affect   Studies/Labs Reviewed:   EKG Interpretation Date/Time:  Tuesday June 02 2023 13:23:44 EST Ventricular Rate:  77 PR Interval:  182 QRS Duration:  84 QT Interval:  402 QTC Calculation: 454 R Axis:   19  Text Interpretation: Normal sinus rhythm Possible Left atrial enlargement When  compared with ECG of 24-Jul-2020 08:46, No significant change was found Confirmed by Nicki Guadalajara ([id number]) on 06/02/2023 1:50:36 PM    Dec 19, 2020 ECG (independently read by me): NSR at 73; Q III, T wave  Abnormality V1-2; no ectopy  January 2021 ECG (independently read by me): Normal sinus rhythm at 72 bpm.  T wave abnormality V1 through V3.  QTc interval 457 ms.  January 24, 2019 ECG (independently read by me):NSR at 9, Nonspecic T wave abnormality V2-3  August 26, 2018 ECG (independently read by me): Normal sinus rhythm at 75 bpm.  Possible left atrial enlargement.  QTc interval 462 ms.  PR interval 176 ms.  July 15, 2018 ECG (independently read by me): Sinus rhythm at 78 bpm.  No ectopy.  Normal intervals.  Early transition.  Recent Labs:    Latest Ref Rng & Units 01/01/2023    2:59 PM 08/07/2022   12:00 AM 02/04/2022   11:26 AM  BMP  Glucose 65 - 99 mg/dL 95  191  91   BUN 7 - 25 mg/dL 14  11  16    Creatinine 0.60 - 1.00 mg/dL 4.78  2.95  6.21   BUN/Creat Ratio 6 - 22 (calc) SEE NOTE:  SEE NOTE:  NOT APPLICABLE   Sodium 135 - 146 mmol/L 141  141  141   Potassium 3.5 - 5.3 mmol/L 4.5  5.0  5.0   Chloride 98 - 110 mmol/L 105  107  107   CO2 20 - 32 mmol/L 27   22  25    Calcium 8.6 - 10.4 mg/dL 9.7  9.9  9.6         Latest Ref Rng & Units 01/01/2023    2:59 PM 08/07/2022   12:00 AM 02/04/2022   11:26 AM  Hepatic Function  Total Protein 6.1 - 8.1 g/dL 6.9  7.3  6.9   AST 10 - 35 U/L 20  25  32   ALT 6 - 29 U/L 14  19  27    Total Bilirubin 0.2 - 1.2 mg/dL 0.4  0.3  0.5        Latest Ref Rng & Units 01/01/2023    2:59 PM 08/07/2022   12:00 AM 02/04/2022   11:26 AM  CBC  WBC 3.8 - 10.8 Thousand/uL 6.6  5.6  5.8   Hemoglobin 11.7 - 15.5 g/dL 30.8  65.7  84.6   Hematocrit 35.0 - 45.0 % 41.3  43.2  43.7   Platelets 140 - 400 Thousand/uL 192  229  189    Lab Results  Component Value Date   MCV 88.2 01/01/2023   MCV 88.9 08/07/2022   MCV 86.9 02/04/2022   Lab Results  Component Value Date   TSH 1.30 08/07/2022   Lab Results  Component Value Date   HGBA1C 6.3 (H) 01/01/2023     BNP No results found for: "BNP"  ProBNP No results found for: "PROBNP"   Lipid Panel     Component Value Date/Time   CHOL 145 01/01/2023 1459   TRIG 106 01/01/2023 1459   HDL 72 01/01/2023 1459   CHOLHDL 2.0 01/01/2023 1459   VLDL 29 11/12/2016 1730   LDLCALC 54 01/01/2023 1459     RADIOLOGY: No results found.   Additional studies/ records that were reviewed today include:  Reviewed the records of Dr. Maple Hudson, her spirometry, chest CT and laboratory.  ASSESSMENT:    1. Essential hypertension   2. Coronary artery disease involving native coronary  artery of native heart without angina pectoris   3. Non-small cell cancer of left lung (HCC)   4. Chronic obstructive pulmonary disease, unspecified COPD type (HCC)   5. Aortic atherosclerosis (HCC)   6. Hyperlipidemia LDL goal <70   7. Cerebral atrophy (HCC)   8. History of tobacco abuse: prior 39 years     PLAN:  Ms. Cayenne Luan is a 72 year old female who had a 57-year history of tobacco use, having started at age 64.  She has a history of surgical rhinitis, recurrent acute bronchitis,  mixed COPD and has been on Singulair, pro-air, and Anora Ellipta.  She has had intermittent episodes of nonradiating short-lived chest discomfort, not typically exertionally precipitated.  A chest CT from August 2019 revealed stable left upper lobe nodule but she was noted to have coronary artery calcification suggesting CAD.  When I initially saw her, I titrated her rosuvastatin to 20 mg in light of her LDL of 93. An echo Doppler study demonstrated normal systolic function with grade 1 diastolic dysfunction.  Due to her coronary calcification and significant tobacco history she underwent coronary CTA.  Calcium score was significantly increased at 891 placing her in the 97th percentile for age and gender.  She was found to have probable 70 to 90% severe stenosis in the proximal LAD and on on FFR assessment this was felt to be hemodynamically significant at 0.76.  Subsequent cardiac catheterization  revealed evidence for coronary calcification with mild nonobstructive CAD for which medical therapy was recommended.  She subsequently was found to have progressive increase in her soft tissue thickening along the posterior lateral left upper lobe bulla and underwent ultimate bronchoscopy with left upper lobectomy and lymph node dissection for non-small cell CA cancer by frozen section.  She was felt to be clear and does not require chemotherapy.  Presently, she remained stable from a cardiac standpoint.  She continues to be on metoprolol succinate 25 mg and a baby aspirin for her coronary disease and blood pressure control.  Fortunately, she quit tobacco use.  She is on rosuvastatin for hyperlipidemia and lipid studies on January 01, 2023 showed an LDL of 54.  Serum creatinine was 0.88.  She is on Trelegy Ellipta for her lung disease in addition to as needed albuterol.  She takes Zyrtec on a daily basis.  She is on sertraline for mood.  She continues to follow with Dr. Fannie Knee for pulmonary care.  She also sees Dr.  Oneta Rack.  Clinically she is doing well.  She is followed by neurology with mild chronic small vessel ischemic disease and cerebral atrophy.  I have recommended she see Bernadene Person, NP in 1 year for follow-up Cardiologic evaluation.   Medication Adjustments/Labs and Tests Ordered: Current medicines are reviewed at length with the patient today.  Concerns regarding medicines are outlined above.  Medication changes, Labs and Tests ordered today are listed in the Patient Instructions below. Patient Instructions  Medication Instructions:   No changes  *If you need a refill on your cardiac medications before your next appointment, please call your pharmacy*   Lab Work: Not needed    Testing/Procedures:  Not needed  Follow-Up: At [facility], you and your health needs are our priority.  As part of our continuing mission to provide you with exceptional heart care, we have created designated Provider Care Teams.  These Care Teams include your primary Cardiologist (physician) and Advanced Practice Providers (APPs -  Physician Assistants and Nurse Practitioners) who all work  together to provide you with the care you need, when you need it.     Your next appointment:   12 month(s)  The format for your next appointment:   In Person  Provider:   Bernadene Person, NP    T.       Signed, Nicki Guadalajara, MD  06/07/2023 3:52 PM    [facility] [address], [place], [place]  [address] Phone: [phone]

## 2023-06-07 ENCOUNTER — Encounter: Payer: Self-pay | Admitting: Cardiovascular Disease

## 2023-06-09 ENCOUNTER — Encounter: Payer: Self-pay | Admitting: Internal Medicine

## 2023-06-17 DIAGNOSIS — D1801 Hemangioma of skin and subcutaneous tissue: Secondary | ICD-10-CM | POA: Diagnosis not present

## 2023-06-17 DIAGNOSIS — L821 Other seborrheic keratosis: Secondary | ICD-10-CM | POA: Diagnosis not present

## 2023-06-17 DIAGNOSIS — L820 Inflamed seborrheic keratosis: Secondary | ICD-10-CM | POA: Diagnosis not present

## 2023-06-17 DIAGNOSIS — D225 Melanocytic nevi of trunk: Secondary | ICD-10-CM | POA: Diagnosis not present

## 2023-06-17 DIAGNOSIS — L308 Other specified dermatitis: Secondary | ICD-10-CM | POA: Diagnosis not present

## 2023-06-17 DIAGNOSIS — D2371 Other benign neoplasm of skin of right lower limb, including hip: Secondary | ICD-10-CM | POA: Diagnosis not present

## 2023-06-17 DIAGNOSIS — L812 Freckles: Secondary | ICD-10-CM | POA: Diagnosis not present

## 2023-06-22 ENCOUNTER — Other Ambulatory Visit: Payer: Self-pay | Admitting: Nurse Practitioner

## 2023-07-09 DIAGNOSIS — Z1151 Encounter for screening for human papillomavirus (HPV): Secondary | ICD-10-CM | POA: Diagnosis not present

## 2023-07-09 DIAGNOSIS — Z1272 Encounter for screening for malignant neoplasm of vagina: Secondary | ICD-10-CM | POA: Diagnosis not present

## 2023-07-09 DIAGNOSIS — R87615 Unsatisfactory cytologic smear of cervix: Secondary | ICD-10-CM | POA: Diagnosis not present

## 2023-07-16 ENCOUNTER — Ambulatory Visit: Payer: Medicare Other | Admitting: Neurology

## 2023-08-03 ENCOUNTER — Encounter: Payer: Self-pay | Admitting: Neurology

## 2023-08-03 ENCOUNTER — Ambulatory Visit (INDEPENDENT_AMBULATORY_CARE_PROVIDER_SITE_OTHER): Payer: Medicare HMO | Admitting: Neurology

## 2023-08-03 VITALS — BP 130/82 | HR 64 | Ht 64.0 in | Wt 180.0 lb

## 2023-08-03 DIAGNOSIS — R0683 Snoring: Secondary | ICD-10-CM

## 2023-08-03 DIAGNOSIS — E538 Deficiency of other specified B group vitamins: Secondary | ICD-10-CM | POA: Diagnosis not present

## 2023-08-03 DIAGNOSIS — G3184 Mild cognitive impairment, so stated: Secondary | ICD-10-CM

## 2023-08-03 MED ORDER — VITAMIN B-12 1000 MCG PO TABS: 1000.0000 ug | ORAL_TABLET | Freq: Every day | ORAL | 3 refills | Status: DC

## 2023-08-03 NOTE — Progress Notes (Signed)
 [facility]  PATIENT: Karen Barrera DOB: 12-03-1950  REQUESTING CLINICIAN: Tonita Fallow, MD HISTORY FROM: Patient and daughter in law  REASON FOR VISIT: Memory loss    HISTORICAL  CHIEF COMPLAINT:  Chief Complaint  Patient presents with   Memory Loss    Rm 13 with daughter in law Maggie  Pt is well, daughter reports she is repetitive with questions. She has trouble with word finding. Occasional trouble with recalling names and common places. Father recently passed with alzheimer's.   She does ADLs alone.     HISTORY OF PRESENT ILLNESS:  This is 73 year old woman past medical history of COPD, hyperlipidemia, depression who is presenting with memory loss for the past year, worse in the past 6 months.  She is accompanied by her daughter in law.  They report that patient has difficulty remembering names of children and grandchildren, sometimes she will forget important birthday.  Her main complaint is word finding difficulty, she said that she can see the words but it just cannot come out.  Also she has difficulty putting sentences together.  She does live with her husband, she is independent in all actives of daily living but is undergoing a lot of stress.  Her granddaughter passed away last Oct 27, 2023she is in a custody battle regarding the great grand child, also reports a lot of death in the family, including her father, 2 weeks before christmas.  She is currently being treated with Zoloft .  She has not completed any therapy, grief therapy.   TBI:  No past history of TBI Stroke:   no past history of stroke Seizures:  One seizure in the past  Sleep: no history of sleep apnea.  Has never had sleep study but there is a lot of snoring  Mood: Yes  patient with anxiety and depression, currently on Zoloft   Family history of Dementia: Father with Dementia  Functional status: independent in all ADLs and IADLs Patient lives with husband. Cooking: no  issues  Cleaning: no issues  Shopping: no issues  Bathing: no issues  Toileting: no issues  Driving: no issues  Bills: no issues  Medications: no issues  Ever left the stove on by accident?: denies  Forget how to use items around the house?: denies  Getting lost going to familiar places?: denies  Forgetting loved ones names?: yes Word finding difficulty? yes Sleep: Not good    OTHER MEDICAL CONDITIONS: COPD, Hyperlipidemia, Depression   REVIEW OF SYSTEMS: Full 14 system review of systems performed and negative with exception of: As noted in the HPI   ALLERGIES: Allergies  Allergen Reactions   Chantix [Varenicline] Anaphylaxis and Swelling    Makes throat swell up   Moxifloxacin  Hcl In Nacl Other (See Comments)    aches    HOME MEDICATIONS: Outpatient Medications Prior to Visit  Medication Sig Dispense Refill   albuterol  (PROVENTIL ) (2.5 MG/3ML) 0.083% nebulizer solution Take 3 mLs (2.5 mg total) by nebulization every 6 (six) hours as needed for wheezing or shortness of breath. 225 mL 3   albuterol  (VENTOLIN  HFA) 108 (90 Base) MCG/ACT inhaler INHALE 2 PUFFS BY MOUTH EVERY 6 HOURS AS NEEDED 26.8 g 3   aspirin  EC 81 MG tablet Take 81 mg by mouth as needed.     Calcium  Carbonate Antacid (CALCIUM  CARBONATE PO) Take 1,200 mg by mouth daily with breakfast.     cetirizine (ZYRTEC) 10 MG tablet Take 10 mg by mouth daily.     Cholecalciferol (VITAMIN  D-3) 125 MCG (5000 UT) TABS Take 2 caps (5,000) units = 10,000 units /daily 30 tablet 0   diphenhydrAMINE  (BENADRYL ) 25 MG tablet Take 25 mg by mouth as needed.     Fluticasone -Umeclidin-Vilant (TRELEGY ELLIPTA ) 100-62.5-25 MCG/ACT AEPB Inhale 1 puff then rinse mouth, once daily 180 each 4   Ginkgo Biloba 40 MG TABS Take by mouth.     ibuprofen  (ADVIL ,MOTRIN ) 200 MG tablet Take 600 mg by mouth every 8 (eight) hours as needed for headache or moderate pain.      metoprolol  succinate (TOPROL -XL) 25 MG 24 hr tablet TAKE 1 TABLET (25 MG  TOTAL) BY MOUTH DAILY. 90 tablet 3   rosuvastatin  (CRESTOR ) 40 MG tablet Take 1 tablet (40 mg total) by mouth daily. 90 tablet 3   sertraline  (ZOLOFT ) 100 MG tablet TAKE 1 TABLET BY MOUTH DAILY FOR MOOD 90 tablet 3   zinc gluconate 50 MG tablet Take 50 mg by mouth daily.     imiquimod (ALDARA) 5 % cream as needed. (Patient not taking: Reported on 08/03/2023)     No facility-administered medications prior to visit.    PAST MEDICAL HISTORY: Past Medical History:  Diagnosis Date   Acute perforated appendicitis 10/31/2014   Adenomatous colon polyp    Allergic rhinitis    Allergy     Anemia    Arthritis    fingers (10/31/2014)   Chronic bronchitis (HCC)    got it q yr til I had my nose OR (10/31/2014)   COPD (chronic obstructive pulmonary disease) (HCC)    Coronary artery disease    DDD (degenerative disc disease), cervical    DDD (degenerative disc disease), lumbar    Depression    short term when my sister passed away unexpectedly   Family history of adverse reaction to anesthesia    mother is hard to wake up; a little goes a long way w/her   Family history of breast cancer    Family history of colon cancer    Family history of skin cancer    GERD (gastroesophageal reflux disease)    History of stomach ulcers    Hyperlipidemia    borderline; RX is preventative (10/31/2014)   Kidney stones    they passed   Lung cancer (HCC) 09/28/2018   Migraine    used to get them alot; don't get them anymore (10/31/2014)   Osteoarthritis of both knees    Osteopenia    Pneumonia 2003 X 1   Pre-diabetes    Seizures (HCC) ~ 1962 X 1   from an abscessed wisdom tooth   Tobacco dependence    Vitamin D  deficiency     PAST SURGICAL HISTORY: Past Surgical History:  Procedure Laterality Date   APPENDECTOMY     ARTHROTOMY Right 01/11/2016   Procedure: DISTAL INTERPHALANGEAL JOINT ARTHROTOMY RIGHT INDEX FINGER;  Surgeon: Franky Curia, MD;  Location: [facility];  Service:  Orthopedics;  Laterality: Right;   BREAST LUMPECTOMY WITH RADIOACTIVE SEED LOCALIZATION Right 08/01/2020   Procedure: RIGHT BREAST LUMPECTOMY WITH RADIOACTIVE SEED LOCALIZATION;  Surgeon: Vernetta Berg, MD;  Location: [facility] OR;  Service: General;  Laterality: Right;  LMA   COLONOSCOPY     CYST EXCISION Right 01/11/2016   Procedure: EXCISION MUCOID CYST;  Surgeon: Franky Curia, MD;  Location:  SURGERY CENTER;  Service: Orthopedics;  Laterality: Right;   GANGLION CYST EXCISION Right 02/21/2020   Procedure: REMOVAL GANGLION OF WRIST;  Surgeon: Curia Franky, MD;  Location:  SURGERY CENTER;  Service: Orthopedics;  Laterality: Right;   LAPAROSCOPIC APPENDECTOMY N/A 10/31/2014   Procedure: APPENDECTOMY LAPAROSCOPIC;  Surgeon: Alm Angle, MD;  Location: [facility] OR;  Service: General;  Laterality: N/A;   LEFT HEART CATH AND CORONARY ANGIOGRAPHY N/A 09/06/2018   Procedure: LEFT HEART CATH AND CORONARY ANGIOGRAPHY;  Surgeon: Burnard Debby LABOR, MD;  Location: [facility];  Service: Cardiovascular;  Laterality: N/A;   POLYPECTOMY     SALPINGOOPHORECTOMY Right 1999   w/grapefruit-sized polyp   SHOULDER ARTHROSCOPY Right 2003   removal spurs   SHOULDER ARTHROSCOPY W/ ROTATOR CUFF REPAIR Left 2012   SINUS EXPLORATION  12/2013   [place]   TENDON REPAIR Left 10/2009   torn tendon @ elbow   VAGINAL HYSTERECTOMY  1978   VIDEO ASSISTED THORACOSCOPY (VATS)/WEDGE RESECTION Left 09/28/2018   Procedure: LEFT VIDEO ASSISTED THORACOSCOPY (VATS)/Completion Left Upper lobe Lobectomy. Lymph node dissection. Intercostal nerve block.;  Surgeon: Army Dallas NOVAK, MD;  Location: [facility] OR;  Service: Thoracic;  Laterality: Left;   VIDEO BRONCHOSCOPY N/A 09/28/2018   Procedure: VIDEO BRONCHOSCOPY;  Surgeon: Army Dallas NOVAK, MD;  Location: [facility] OR;  Service: Thoracic;  Laterality: N/A;    FAMILY HISTORY: Family History  Problem Relation Age of Onset   Diabetes Mother    Skin cancer Mother 24       unsure  what type   Colon polyps Father        has had about 5 every c-scope (every 5 years-ish)   Alzheimer's disease Father    Epilepsy Sister    Stroke Maternal Grandmother    Hyperlipidemia Maternal Grandfather    Arthritis Paternal Grandmother    Melanoma Paternal Grandmother 55   Colon cancer Other        dx 40's/50's   Colon cancer Other 27   Colon cancer Other 75   Cancer Other        type unknown   Stomach cancer Neg Hx    Esophageal cancer Neg Hx    Rectal cancer Neg Hx     SOCIAL HISTORY: Social History   Socioeconomic History   Marital status: Married    Spouse name: Not on file   Number of children: Not on file   Years of education: Not on file   Highest education level: Not on file  Occupational History   Occupation: Retired  Tobacco Use   Smoking status: Former    Current packs/day: 0.00    Average packs/day: 1 pack/day for 50.0 years (50.0 ttl pk-yrs)    Types: Cigarettes    Start date: 09/26/1968    Quit date: 09/27/2018    Years since quitting: 4.8   Smokeless tobacco: Never   Tobacco comments:    Quit 09/27/2018  Vaping Use   Vaping status: Former  Substance and Sexual Activity   Alcohol use: No    Alcohol/week: 0.0 standard drinks of alcohol   Drug use: No   Sexual activity: Not Currently    Partners: Male    Birth control/protection: Post-menopausal  Other Topics Concern   Not on file  Social History Narrative   Lives at home with husband.    Has 2 children.    Social Drivers of Corporate Investment Banker Strain: Not on file  Food Insecurity: Not on file  Transportation Needs: Not on file  Physical Activity: Not on file  Stress: Not on file  Social Connections: Not on file  Intimate Partner Violence: Not on file    PHYSICAL EXAM  GENERAL EXAM/CONSTITUTIONAL: Vitals:  Vitals:   08/03/23 1032  BP: 130/82  Pulse: 64  Weight: 180 lb (81.6 kg)  Height: 5' 4 (1.626 m)   Body mass index is 30.9 kg/m. Wt Readings from Last 3  Encounters:  08/03/23 180 lb (81.6 kg)  06/02/23 182 lb (82.6 kg)  04/10/23 177 lb 9.6 oz (80.6 kg)   Patient is in no distress; well developed, nourished and groomed; neck is supple  MUSCULOSKELETAL: Gait, strength, tone, movements noted in Neurologic exam below  NEUROLOGIC: MENTAL STATUS:      No data to display         awake, alert, oriented to person, place and time recent and remote memory intact normal attention and concentration language fluent, comprehension intact, naming intact fund of knowledge appropriate     08/03/2023   10:34 AM  Montreal Cognitive Assessment   Visuospatial/ Executive (0/5) 1  Naming (0/3) 2  Attention: Read list of digits (0/2) 2  Attention: Read list of letters (0/1) 1  Attention: Serial 7 subtraction starting at 100 (0/3) 1  Language: Repeat phrase (0/2) 1  Language : Fluency (0/1) 0  Abstraction (0/2) 2  Delayed Recall (0/5) 0  Orientation (0/6) 4  Total 14  Adjusted Score (based on education) 14    CRANIAL NERVE:  2nd, 3rd, 4th, 6th- visual fields full to confrontation, extraocular muscles intact, no nystagmus 5th - facial sensation symmetric 7th - facial strength symmetric 8th - hearing intact 9th - palate elevates symmetrically, uvula midline 11th - shoulder shrug symmetric 12th - tongue protrusion midline  MOTOR:  normal bulk and tone, full strength in the BUE, BLE  SENSORY:  normal and symmetric to light touch  COORDINATION:  finger-nose-finger, fine finger movements normal  GAIT/STATION:  normal   DIAGNOSTIC DATA (LABS, IMAGING, TESTING) - I reviewed patient records, labs, notes, testing and imaging myself where available.  Lab Results  Component Value Date   WBC 6.6 01/01/2023   HGB 13.5 01/01/2023   HCT 41.3 01/01/2023   MCV 88.2 01/01/2023   PLT 192 01/01/2023      Component Value Date/Time   NA 141 01/01/2023 1459   K 4.5 01/01/2023 1459   CL 105 01/01/2023 1459   CO2 27 01/01/2023 1459    GLUCOSE 95 01/01/2023 1459   BUN 14 01/01/2023 1459   CREATININE 0.88 01/01/2023 1459   CALCIUM  9.7 01/01/2023 1459   PROT 6.9 01/01/2023 1459   ALBUMIN  3.0 (L) 09/30/2018 0350   AST 20 01/01/2023 1459   ALT 14 01/01/2023 1459   ALKPHOS 46 09/30/2018 0350   BILITOT 0.4 01/01/2023 1459   GFRNONAA 65 12/31/2020 1639   GFRAA 76 12/31/2020 1639   Lab Results  Component Value Date   CHOL 145 01/01/2023   HDL 72 01/01/2023   LDLCALC 54 01/01/2023   TRIG 106 01/01/2023   CHOLHDL 2.0 01/01/2023   Lab Results  Component Value Date   HGBA1C 6.3 (H) 01/01/2023   Lab Results  Component Value Date   VITAMINB12 379 01/01/2023   Lab Results  Component Value Date   TSH 1.30 08/07/2022    MRI Brain 02/17/2023 1. No acute intracranial abnormality or mass. 2. Mild chronic small vessel ischemic disease and cerebral atrophy   ASSESSMENT AND PLAN  73 y.o. year old female with history of COPD, hyperlipidemia, depression who is presenting with memory loss for the past year getting worse in the past 39-months described as word finding difficulty.  She is  also undergoing a lot of stress, family stress including custody battle and losing her father 2 weeks before Christmas.  She is currently on Zoloft  but has not completed any type of therapy.  On exam today she scored a 14 out of 30 on the MoCA but she is independent in all her ADLs and IADLs.  I have informed patient that her MoCA score is nonconcordant with her examination.  I am afraid that her mild cognitive impairment is likely related to some underlying depression.  Advised her to continue following up with her doctors, to consider therapy and I will also refer her for formal neuropsychological testing.  Will still obtain ATN profile to look for Alzheimer disease biomarker. Both patient and her daughter-in-law also report excessive snoring, sometime stopping breathing during sleep.  Patient denies any morning headaches, denies being tired during  the daytime, denies falling asleep rapidly while watching TV and they would like to be evaluated for sleep apnea.  Will send a referral.  I will contact them to go over the results, otherwise we will see them in 1 year for follow-up.   1. Mild cognitive impairment   2. Snoring   3. Vitamin B 12 deficiency     Patient Instructions  ATN profile to look for Alzheimer disease biomarker Start B12 supplement 1000 mcg daily Referral for formal neuropsychological testing Referral to sleep neurology for sleep apnea evaluation Discussed ways of reducing the risk of developing dementia including exercising, keeping a good health, good sleep and good diet. Follow-up in 1 year or sooner if worse.   There are well-accepted and sensible ways to reduce risk for Alzheimers disease and other degenerative brain disorders .  Exercise Daily Walk A daily 20 minute walk should be part of your routine. Disease related apathy can be a significant roadblock to exercise and the only way to overcome this is to make it a daily routine and perhaps have a reward at the end (something your loved one loves to eat or drink perhaps) or a personal trainer coming to the home can also be very useful. Most importantly, the patient is much more likely to exercise if the caregiver / spouse does it with him/her. In general a structured, repetitive schedule is best.  General Health: Any diseases which effect your body will effect your brain such as a pneumonia, urinary infection, blood clot, heart attack or stroke. Keep contact with your primary care doctor for regular follow ups.  Sleep. A good nights sleep is healthy for the brain. Seven hours is recommended. If you have insomnia or poor sleep habits we can give you some instructions. If you have sleep apnea wear your mask.  Diet: Eating a heart healthy diet is also a good idea; fish and poultry instead of red meat, nuts (mostly non-peanuts), vegetables, fruits, olive oil or canola  oil (instead of butter), minimal salt (use other spices to flavor foods), whole grain rice, bread, cereal and pasta and wine in moderation.Research is now showing that the MIND diet, which is a combination of The Mediterranean diet and the DASH diet, is beneficial for cognitive processing and longevity. Information about this diet can be found in The MIND Diet, a book by Annitta Feeling, MS, RDN, and online at wildwildscience.es  Finances, Power of [address] and Advance Directives: You should consider putting legal safeguards in place with regard to financial and medical decision making. While the spouse always has power of attorney for medical and financial issues in the absence  of any form, you should consider what you want in case the spouse / caregiver is no longer around or capable of making decisions.   Orders Placed This Encounter  Procedures   ATN PROFILE   Ambulatory referral to Neuropsychology   Ambulatory referral to Neurology    Meds ordered this encounter  Medications   cyanocobalamin  (VITAMIN B12) 1000 MCG tablet    Sig: Take 1 tablet (1,000 mcg total) by mouth daily.    Dispense:  90 tablet    Refill:  3    Return in about 1 year (around 08/02/2024).   I have spent a total of 65 minutes dedicated to this patient today, preparing to see patient, performing a medically appropriate examination and evaluation, ordering tests and/or medications and procedures, and counseling and educating the patient/family/caregiver; independently interpreting result and communicating results to the family/patient/caregiver; and documenting clinical information in the electronic medical record.   Pastor Falling, MD 08/03/2023, 2:34 PM  [facility] [address] [place], [place] [address] [phone]

## 2023-08-03 NOTE — Patient Instructions (Addendum)
 ATN profile to look for Alzheimer disease biomarker Start B12 supplement 1000 mcg daily Referral for formal neuropsychological testing Referral to sleep neurology for sleep apnea evaluation Discussed ways of reducing the risk of developing dementia including exercising, keeping a good health, good sleep and good diet. Follow-up in 1 year or sooner if worse.   There are well-accepted and sensible ways to reduce risk for Alzheimers disease and other degenerative brain disorders .  Exercise Daily Walk A daily 20 minute walk should be part of your routine. Disease related apathy can be a significant roadblock to exercise and the only way to overcome this is to make it a daily routine and perhaps have a reward at the end (something your loved one loves to eat or drink perhaps) or a personal trainer coming to the home can also be very useful. Most importantly, the patient is much more likely to exercise if the caregiver / spouse does it with him/her. In general a structured, repetitive schedule is best.  General Health: Any diseases which effect your body will effect your brain such as a pneumonia, urinary infection, blood clot, heart attack or stroke. Keep contact with your primary care doctor for regular follow ups.  Sleep. A good nights sleep is healthy for the brain. Seven hours is recommended. If you have insomnia or poor sleep habits we can give you some instructions. If you have sleep apnea wear your mask.  Diet: Eating a heart healthy diet is also a good idea; fish and poultry instead of red meat, nuts (mostly non-peanuts), vegetables, fruits, olive oil or canola oil (instead of butter), minimal salt (use other spices to flavor foods), whole grain rice, bread, cereal and pasta and wine in moderation.Research is now showing that the MIND diet, which is a combination of The Mediterranean diet and the DASH diet, is beneficial for cognitive processing and longevity. Information about this diet can be  found in The MIND Diet, a book by Annitta Feeling, MS, RDN, and online at wildwildscience.es  Finances, Power of [address] and Advance Directives: You should consider putting legal safeguards in place with regard to financial and medical decision making. While the spouse always has power of attorney for medical and financial issues in the absence of any form, you should consider what you want in case the spouse / caregiver is no longer around or capable of making decisions.

## 2023-08-05 ENCOUNTER — Telehealth: Payer: Self-pay | Admitting: Neurology

## 2023-08-05 NOTE — Telephone Encounter (Signed)
 Referral for neuropsychology fax to [facility]. [phone], Fax: [phone]

## 2023-08-06 LAB — ATN PROFILE
A -- Beta-amyloid 42/40 Ratio: 0.107 (ref >0.102)
Beta-amyloid 40: 214.82 pg/mL
Beta-amyloid 42: 22.9 pg/mL
N -- NfL, Plasma: 3.5 pg/mL (ref 0.00–7.64)
T -- p-tau181: 2.02 pg/mL — ABNORMAL HIGH (ref 0.00–0.97)

## 2023-08-10 NOTE — Progress Notes (Signed)
 CPE  Assessment:   1. CPE Due Annually Health maintenance reviewed   2. Aortic atherosclerosis (HCC) - per CT 05/2017 Discussed lifestyle modifications. Recommended diet heavy in fruits and veggies, omega 3's. Decrease consumption of animal meats, cheeses, and dairy products. Remain active and exercise as tolerated. Pulmonology following. Continue to monitor.  3. Essential hypertension Discussed DASH (Dietary Approaches to Stop Hypertension) DASH diet is lower in sodium than a typical American diet. Cut back on foods that are high in saturated fat, cholesterol, and trans fats. Eat more whole-grain foods, fish, poultry, and nuts Remain active and exercise as tolerated daily.  Monitor BP at home-Call if greater than 130/80.  Check CMP/CBC  4. COPD mixed type (HCC) Continue inhalers  Avoid triggers   5. Seasonal and perennial allergic rhinitis Continue antihistamine. Avoid triggers.  6. Malignant neoplasm of upper lobe of left lung (HCC) S/p lobectomy, continue follow up with surgeon  7. Other abnormal glucose (prediabetes) Education: Reviewed 'ABCs' of diabetes management  A1C (<7) Blood pressure (<130/80) Cholesterol (LDL <70) Continue Eye Exam yearly  Continue Dental Exam Q6 mo Discussed dietary recommendations Discussed Physical Activity recommendations  8. Fatty liver Weight loss advised Monitor LFTs Avoid alcohol and tylenol  Discussed lifestyle modifications.  9. Gastroesophageal reflux disease, unspecified whether esophagitis present No suspected reflux complications (Barret/stricture). Lifestyle modification:  wt loss, avoid meals 2-3h before bedtime. Consider eliminating food triggers:  chocolate, caffeine, EtOH, acid/spicy food.   10. Liver lesion, right lobe Continue to monitor Monitor LFTs  11. Arthritis RICE Method when flared. OTC analgesic PRN as directed  12. Osteopenia, unspecified location RICE Method when flared. OTC analgesic PRN as  directed  13. Recurrent major depressive disorder, in full remission (HCC) Controlled Continue Zoloft . Reviewed relaxation techniques.  Sleep hygiene. Recommended mindfulness meditation and exercise.   Insight-oriented psychotherapy given for 16 minutes exclusively. Encouraged personality growth wand development through coping techniques and problem-solving skills. Limit/Decrease/Monitor drug/alcohol intake.    14. Mixed hyperlipidemia Discussed lifestyle modifications. Recommended diet heavy in fruits and veggies, omega 3's. Decrease consumption of animal meats, cheeses, and dairy products. Remain active and exercise as tolerated. Continue to monitor.  15. Obesity (BMI 30.0-34.9) Discussed appropriate BMI Diet modification. Physical activity. Encouraged/praised to build confidence.  16. Hx of adenomatous colonic polyps No recent flares or changes to bowels. Continue with colonoscopy screenings.  17. Cognitive decline/Family Hx of Alzheimer's  Psychological; overstressed versus true neurological decline Dr. Gregg Neurology feels underlying depression - discussed reaching out to therapist for CBT - patient to contact insurance    Alzheimer's disease blood work   18.  Vitamin D  deficiency Monitor Vitamin D  Recommend goal of 60-100  19.  Vitamin B12 Deficiency Monitor levels  20. Screening for hematuria or proteinuria Check and monitor Microalbumin / creatinine urine ratio Check and monitor Urinalysis, Routine w reflex microscopic  21.  Medication management All medications discussed and reviewed in full. All questions and concerns regarding medications addressed.  Orders Placed This Encounter  Procedures   CBC with Differential/Platelet   COMPLETE METABOLIC PANEL WITH GFR   Magnesium   Lipid panel   TSH   Hemoglobin A1c   Insulin , random   VITAMIN D  25 Hydroxy (Vit-D Deficiency, Fractures)   Urinalysis, Routine w reflex microscopic   Microalbumin / creatinine  urine ratio   Vitamin B12   EKG 12-Lead   Notify office for further evaluation and treatment, questions or concerns if any reported s/s fail to improve.   The patient was advised  to call back or seek an in-person evaluation if any symptoms worsen or if the condition fails to improve as anticipated.   Further disposition pending results of labs. Discussed med's effects and SE's.    I discussed the assessment and treatment plan with the patient. The patient was provided an opportunity to ask questions and all were answered. The patient agreed with the plan and demonstrated an understanding of the instructions.  Discussed med's effects and SE's. Screening labs and tests as requested with regular follow-up as recommended.  I provided 35 minutes of face-to-face time during this encounter including counseling, chart review, and critical decision making was preformed.  Today's Plan of Care is based on a patient-centered health care approach known as shared decision making - the decisions, tests and treatments allow for patient preferences and values to be balanced with clinical evidence.    Future Appointments  Date Time Provider Department Center  12/24/2023  9:00 AM Neysa Reggy BIRCH, MD [facility] None  01/13/2024 11:30 AM Laurice President, NP [facility] None  08/04/2024 11:15 AM Gregg Lek, MD [facility] None  08/10/2024  9:00 AM Laurice President, NP [facility] None    Subjective:  Karen Barrera is a 73 y.o. female who presents for a CPE. She has Seasonal and perennial allergic rhinitis; Depression; Hyperlipidemia; Other abnormal glucose (prediabetes); Vitamin D  deficiency; Former smoker; Medication management; Hx of adenomatous colonic polyps; Costochondritis; COPD mixed type (HCC); Hot flashes; Obesity (BMI 30.0-34.9); Genetic testing; Aortic atherosclerosis (HCC) - per CT 05/2017; Fatty liver; Agatston coronary artery calcium  score greater than 400; Lung cancer (HCC); Hormone replacement  therapy; Arthritis; Gastroesophageal reflux disease; Osteopenia; Primary osteoarthritis of first carpometacarpal joint of left hand; Family history of cancer; VIN III (vulvar intraepithelial neoplasia III); Memory changes; Liver lesion, right lobe; Essential hypertension; and Anosmia on their problem list.  She is continuing to take care of her aging parents (44 yo) and aunt (29 yo) along with her 43 yo grandson and 68 year old great grand-child.  She is fatigued and continues to feel forgetful.  She is concerned for increase in Alzheimer's disease given father was diagnosed around age 65.    Had a f/u with Dr. Camara 08/03/23 for mild cognitive impairment.  Had consultation for repetitive questions, word finding, recalling names and common places. DIL Maggie accompanied.  Patient has father that recently passed with Alzheimer's.  MoCa score was 14/30.  Neurology felt that her mild cognitive impairment is likely related to some underlying depression.  She was referred for neuropsychological testing.  ATN profile was obtained to look for Alzheimer disease biomarker.  During her visit she was also noted to have excessive snoring and referral was placed for OSA.  Recommend a 1 year follow up.   She has COPD, s/p left lobectomy for stage 2 non small cell carcinoma on 09/28/2018 with Dr. Army. She has not smoked since March 3rd 2020. Occasional dry cough but has improved, she uses albuterol  to take as needed, didn't see benefit with anoro. Had CT 10/04/2020 showing no evidence of recurrent or metastatic disease, however concerned for memory changes.   Additionally recurrent lung imaging has showed hepatic steatosis and R lobe lesion, originally noted on CT 10/2014 and characterized as cyst at that time, approx 3 cm, overall fairly stable on serial imaging since that time, felt likely cyst without concerning features. MRI has been suggested if needing to characterize further.  She has allergies, has been taking  zyrtec AM, benadryl  PM. Denies HA, fever/chills,  sinus tenderness.   Remote TAH. She is recently following with [facility] and Dr. Eloy for VIN III, planning q42m follow up. Still has hot flashes, but off of estrogen due to cancer.   On 08/01/2020 had R lumpectomy by Dr. Vernetta for 0.6 x 0.7 x 0.3 lesion was seen at the 1 o'clock position of the right breast near the nipple areolar complex seen on recent mammogram.  A biopsy of this was performed showing a complex sclerosing lesion, biopsy returned benign. Continues to resume routine annual mammograms through GYN.   She is on prilosec 40 mg, recently takes PRN, hasn't needed in some time.  BMI is Body mass index is 31.1 kg/m., she has been working on diet and exercise Wt Readings from Last 3 Encounters:  08/11/23 181 lb 3.2 oz (82.2 kg)  08/03/23 180 lb (81.6 kg)  06/02/23 182 lb (82.6 kg)   She follows with Dr. Burnard for ASCAD; Ct chest 2018 showed aortic atherosclerosis; cath 08/2018 revealed evidence for coronary calcification with mild nonobstructive CAD for which medical therapy was recommended.  Her blood pressure has been controlled at home, today their BP is BP: 108/76 She admits to not staying well hydrated.  She does not workout, she is cleaning, being active inside and watches great grandson but not doing any working out. .She denies chest pain, shortness of breath, dizziness.   She is on cholesterol medication and denies myalgias. Her cholesterol is not at goal. The cholesterol last visit was:   Lab Results  Component Value Date   CHOL 145 01/01/2023   HDL 72 01/01/2023   LDLCALC 54 01/01/2023   TRIG 106 01/01/2023   CHOLHDL 2.0 01/01/2023   She has prediabetes, has been working on diet/exericse. Denies diabetic polys. Last A1C:  Lab Results  Component Value Date   HGBA1C 6.3 (H) 01/01/2023   Last GFR: Lab Results  Component Value Date   GFRNONAA 65 12/31/2020   Patient is on Vitamin D   supplement, taking 10000 IU  Lab Results  Component Value Date   VD25OH 36 01/01/2023     She has not started on a b12 supplement;  Lab Results  Component Value Date   VITAMINB12 379 01/01/2023    Medication Review:   Current Outpatient Medications (Cardiovascular):    metoprolol  succinate (TOPROL -XL) 25 MG 24 hr tablet, TAKE 1 TABLET (25 MG TOTAL) BY MOUTH DAILY.   rosuvastatin  (CRESTOR ) 40 MG tablet, Take 1 tablet (40 mg total) by mouth daily.  Current Outpatient Medications (Respiratory):    albuterol  (PROVENTIL ) (2.5 MG/3ML) 0.083% nebulizer solution, Take 3 mLs (2.5 mg total) by nebulization every 6 (six) hours as needed for wheezing or shortness of breath.   albuterol  (VENTOLIN  HFA) 108 (90 Base) MCG/ACT inhaler, INHALE 2 PUFFS BY MOUTH EVERY 6 HOURS AS NEEDED   diphenhydrAMINE  (BENADRYL ) 25 MG tablet, Take 25 mg by mouth as needed.   Fluticasone -Umeclidin-Vilant (TRELEGY ELLIPTA ) 100-62.5-25 MCG/ACT AEPB, Inhale 1 puff then rinse mouth, once daily   cetirizine (ZYRTEC) 10 MG tablet, Take 10 mg by mouth daily. (Patient not taking: Reported on 08/11/2023)  Current Outpatient Medications (Analgesics):    aspirin  EC 81 MG tablet, Take 81 mg by mouth as needed.   ibuprofen  (ADVIL ,MOTRIN ) 200 MG tablet, Take 600 mg by mouth every 8 (eight) hours as needed for headache or moderate pain.   Current Outpatient Medications (Hematological):    cyanocobalamin  (VITAMIN B12) 1000 MCG tablet, Take 1 tablet (1,000 mcg total)  by mouth daily.  Current Outpatient Medications (Other):    Cholecalciferol (VITAMIN D -3) 125 MCG (5000 UT) TABS, Take 2 caps (5,000) units = 10,000 units /daily   gabapentin  (NEURONTIN ) 300 MG capsule, Take 300 mg by mouth daily.   triamcinolone cream (KENALOG) 0.1 %, Apply 1 Application topically daily.   Calcium  Carbonate Antacid (CALCIUM  CARBONATE PO)*, Take 1,200 mg by mouth daily with breakfast. (Patient not taking: Reported on 08/11/2023)   Ginkgo Biloba 40 MG  TABS, Take by mouth. (Patient not taking: Reported on 08/11/2023)   sertraline  (ZOLOFT ) 100 MG tablet, TAKE 1 TABLET BY MOUTH DAILY FOR MOOD (Patient not taking: Reported on 08/11/2023)   zinc gluconate 50 MG tablet, Take 50 mg by mouth daily. (Patient not taking: Reported on 08/11/2023) * These medications belong to multiple therapeutic classes and are listed under each applicable group.   Allergies  Allergen Reactions   Chantix [Varenicline] Anaphylaxis and Swelling    Makes throat swell up   Moxifloxacin  Hcl In Nacl Other (See Comments)    aches    Current Problems (verified) Patient Active Problem List   Diagnosis Date Noted   Anosmia 10/20/2022   Essential hypertension 04/07/2021   Memory changes 12/31/2020   Liver lesion, right lobe 12/31/2020   VIN III (vulvar intraepithelial neoplasia III) 05/11/2020   Primary osteoarthritis of first carpometacarpal joint of left hand 04/27/2019   Hormone replacement therapy 04/06/2019   Arthritis 04/06/2019   Gastroesophageal reflux disease 04/06/2019   Osteopenia 04/06/2019   Family history of cancer 04/06/2019   Lung cancer (HCC) 09/28/2018   Agatston coronary artery calcium  score greater than 400    Fatty liver 08/29/2018   Aortic atherosclerosis (HCC) - per CT 05/2017 08/06/2017   Genetic testing 04/24/2017   Hot flashes 11/12/2016   Obesity (BMI 30.0-34.9) 11/12/2016   COPD mixed type (HCC) 06/05/2016   Costochondritis 03/23/2015   Hx of adenomatous colonic polyps 01/23/2015   Medication management 11/21/2014   Depression    Hyperlipidemia    Other abnormal glucose (prediabetes)    Vitamin D  deficiency    Former smoker    Seasonal and perennial allergic rhinitis 07/03/2013    Screening Tests Immunization History  Administered Date(s) Administered   Fluad Quad(high Dose 65+) 08/07/2021   Influenza Split 05/16/2013, 05/12/2014, 05/14/2015   Influenza, High Dose Seasonal PF 05/15/2017, 05/07/2018, 03/29/2019, 03/26/2022    Influenza, Seasonal, Injecte, Preservative Fre 05/13/2016   Influenza,inj,quad, With Preservative 05/07/2018   Influenza-Unspecified 07/23/2020   PFIZER(Purple Top)SARS-COV-2 Vaccination 10/01/2019, 10/22/2019, 05/17/2020   Pneumococcal Conjugate-13 11/12/2016   Pneumococcal Polysaccharide-23 06/14/2011   Rsv, Bivalent, Protein Subunit Rsvpref,pf (Abrysvo) 03/26/2022   Tdap 03/27/2010   Unspecified SARS-COV-2 Vaccination 10/01/2019, 10/22/2019   Zoster, Live 04/14/2012    Tetanus: 2011 due, will get PRN Pneumovax:  2012 Prevnar 13: 2018 Flu vaccine: 02/2022 Zostavax: 2013 COVID: 2/2, 2021 + booster Shingrix 2021 has gotten both at pharmacy -  need documentation  MGM: 03/2023 diagnostic, getting annual screening via GYN  DEXA: 2021  At GYN Osteopenia will get at GYN-Due 2023 PAP getting by GYN, vulvar lesion is monitored annually Colonoscopy: 05/2020 due in 3 years, 5 polyps, Dr. Avram  PFT 08/2018 CT chest 09/2020 Cath 08/2018  Names of Other Physician/Practitioners you currently use: 1. [facility] here for primary care 2. Dr. Murray Mate, eye doctor, last visit 2024 3. Dr. Lysle, dentist 2024 4. Derm, last visit 04/2020 for total body check   Patient Care Team: Tonita,  Elsie, MD as PCP - General (Internal Medicine) Burnard Debby LABOR, MD as PCP - Cardiology (Cardiology) Jesus Oliphant, MD as Consulting Physician (Otolaryngology) Darina Barrio, MD as Consulting Physician (Gynecology) Avram Lupita BRAVO, MD as Consulting Physician (Gastroenterology) Jobe Devere CROME, MD as Referring Physician (Surgery)  SURGICAL HISTORY She  has a past surgical history that includes Shoulder arthroscopy w/ rotator cuff repair (Left, 2012); Tendon repair (Left, 10/2009); Shoulder arthroscopy (Right, 2003); Salpingoophorectomy (Right, 1999); Sinus exploration (12/2013); Vaginal hysterectomy (1978); laparoscopic appendectomy (N/A, 10/31/2014); Appendectomy;  Colonoscopy; Cyst excision (Right, 01/11/2016); Arthrotomy (Right, 01/11/2016); LEFT HEART CATH AND CORONARY ANGIOGRAPHY (N/A, 09/06/2018); Video bronchoscopy (N/A, 09/28/2018); Video assisted thoracoscopy (vats)/wedge resection (Left, 09/28/2018); Ganglion cyst excision (Right, 02/21/2020); Polypectomy; and Breast lumpectomy with radioactive seed localization (Right, 08/01/2020). FAMILY HISTORY Her family history includes Alzheimer's disease in her father; Arthritis in her paternal grandmother; Cancer in an other family member; Colon cancer in an other family member; Colon cancer (age of onset: 81) in some other family members; Colon polyps in her father; Diabetes in her mother; Epilepsy in her sister; Hyperlipidemia in her maternal grandfather; Melanoma (age of onset: 60) in her paternal grandmother; Skin cancer (age of onset: 60) in her mother; Stroke in her maternal grandmother. SOCIAL HISTORY She  reports that she quit smoking about 4 years ago. Her smoking use included cigarettes. She started smoking about 54 years ago. She has a 50 pack-year smoking history. She has never used smokeless tobacco. She reports that she does not drink alcohol and does not use drugs.  Review of Systems  Constitutional: Negative.  Negative for malaise/fatigue and weight loss.  HENT:  Negative for ear pain, hearing loss, sinus pain and tinnitus.        Chronically decreased smell and taste, has seen ENT  Eyes: Negative.  Negative for blurred vision and double vision.  Respiratory:  Negative for cough, hemoptysis, sputum production, shortness of breath and wheezing.   Cardiovascular: Negative.  Negative for chest pain, palpitations, orthopnea, claudication and leg swelling.  Gastrointestinal:  Negative for abdominal pain, blood in stool, constipation, diarrhea, heartburn, melena, nausea and vomiting.  Genitourinary: Negative.   Musculoskeletal:  Negative for back pain, falls, joint pain, myalgias and neck pain.  Skin: Negative.   Negative for rash.  Neurological: Negative.  Negative for dizziness, tingling, sensory change, weakness and headaches.  Endo/Heme/Allergies:  Positive for environmental allergies. Negative for polydipsia.  Psychiatric/Behavioral:  Positive for memory loss (more difficulty with navigation, good short term recall ). Negative for depression, hallucinations, substance abuse and suicidal ideas. The patient is not nervous/anxious and does not have insomnia.   All other systems reviewed and are negative.    Objective:     Today's Vitals   08/11/23 0908  BP: 108/76  Pulse: 64  Temp: 97.6 F (36.4 C)  SpO2: 98%  Weight: 181 lb 3.2 oz (82.2 kg)  Height: 5' 4 (1.626 m)    Body mass index is 31.1 kg/m.  General appearance: alert, no distress, WD/WN, female HEENT: normocephalic, sclerae anicteric, TMs pearly, nares patent, no discharge or erythema, pharynx normal, no sinus tenderness Oral cavity: MMM, no lesions Neck: supple, no lymphadenopathy, no thyromegaly, no masses Heart: RRR, normal S1, S2, no murmur  Lungs: CTA bilaterally, no wheezes, rhonchi, or rales Abdomen: +bs, soft, non-tender, non distended, no masses, no hepatomegaly, no splenomegaly Musculoskeletal: nontender, no swelling, no obvious deformity Extremities: no edema, no cyanosis, no clubbing Pulses: 2+ symmetric, upper and lower extremities, normal cap refill Neurological: alert,  oriented x 3, CN2-12 intact, strength normal upper extremities and lower extremities, sensation normal throughout, DTRs 2+ throughout, no cerebellar signs, gait normal Psychiatric: normal affect, behavior normal, pleasant   EKG:  NSR   Woodford Strege, NP   08/11/2023

## 2023-08-11 ENCOUNTER — Encounter: Payer: Self-pay | Admitting: Nurse Practitioner

## 2023-08-11 ENCOUNTER — Ambulatory Visit (INDEPENDENT_AMBULATORY_CARE_PROVIDER_SITE_OTHER): Payer: Medicare HMO | Admitting: Nurse Practitioner

## 2023-08-11 VITALS — BP 108/76 | HR 64 | Temp 97.6°F | Ht 64.0 in | Wt 181.2 lb

## 2023-08-11 DIAGNOSIS — E559 Vitamin D deficiency, unspecified: Secondary | ICD-10-CM

## 2023-08-11 DIAGNOSIS — I10 Essential (primary) hypertension: Secondary | ICD-10-CM

## 2023-08-11 DIAGNOSIS — E782 Mixed hyperlipidemia: Secondary | ICD-10-CM

## 2023-08-11 DIAGNOSIS — Z0000 Encounter for general adult medical examination without abnormal findings: Secondary | ICD-10-CM | POA: Diagnosis not present

## 2023-08-11 DIAGNOSIS — I700 Atherosclerosis of aorta: Secondary | ICD-10-CM

## 2023-08-11 DIAGNOSIS — J302 Other seasonal allergic rhinitis: Secondary | ICD-10-CM

## 2023-08-11 DIAGNOSIS — Z136 Encounter for screening for cardiovascular disorders: Secondary | ICD-10-CM

## 2023-08-11 DIAGNOSIS — K219 Gastro-esophageal reflux disease without esophagitis: Secondary | ICD-10-CM

## 2023-08-11 DIAGNOSIS — Z79899 Other long term (current) drug therapy: Secondary | ICD-10-CM

## 2023-08-11 DIAGNOSIS — Z860101 Personal history of adenomatous and serrated colon polyps: Secondary | ICD-10-CM

## 2023-08-11 DIAGNOSIS — K769 Liver disease, unspecified: Secondary | ICD-10-CM

## 2023-08-11 DIAGNOSIS — R7309 Other abnormal glucose: Secondary | ICD-10-CM

## 2023-08-11 DIAGNOSIS — R4189 Other symptoms and signs involving cognitive functions and awareness: Secondary | ICD-10-CM

## 2023-08-11 DIAGNOSIS — C3490 Malignant neoplasm of unspecified part of unspecified bronchus or lung: Secondary | ICD-10-CM

## 2023-08-11 DIAGNOSIS — F3342 Major depressive disorder, recurrent, in full remission: Secondary | ICD-10-CM

## 2023-08-11 DIAGNOSIS — Z820 Family history of epilepsy and other diseases of the nervous system: Secondary | ICD-10-CM

## 2023-08-11 DIAGNOSIS — M1990 Unspecified osteoarthritis, unspecified site: Secondary | ICD-10-CM

## 2023-08-11 DIAGNOSIS — E538 Deficiency of other specified B group vitamins: Secondary | ICD-10-CM

## 2023-08-11 DIAGNOSIS — K760 Fatty (change of) liver, not elsewhere classified: Secondary | ICD-10-CM

## 2023-08-11 DIAGNOSIS — Z1389 Encounter for screening for other disorder: Secondary | ICD-10-CM

## 2023-08-11 DIAGNOSIS — E66811 Obesity, class 1: Secondary | ICD-10-CM

## 2023-08-11 DIAGNOSIS — Z0001 Encounter for general adult medical examination with abnormal findings: Secondary | ICD-10-CM

## 2023-08-11 DIAGNOSIS — M8580 Other specified disorders of bone density and structure, unspecified site: Secondary | ICD-10-CM

## 2023-08-11 NOTE — Patient Instructions (Signed)

## 2023-08-12 LAB — URINALYSIS, ROUTINE W REFLEX MICROSCOPIC
Bilirubin Urine: NEGATIVE
Glucose, UA: NEGATIVE
Hgb urine dipstick: NEGATIVE
Ketones, ur: NEGATIVE
Leukocytes,Ua: NEGATIVE
Nitrite: NEGATIVE
Protein, ur: NEGATIVE
Specific Gravity, Urine: 1.014 (ref 1.001–1.035)
pH: <=5 (ref 5.0–8.0)

## 2023-08-12 LAB — CBC WITH DIFFERENTIAL/PLATELET
Absolute Lymphocytes: 1853 {cells}/uL (ref 850–3900)
Absolute Monocytes: 490 {cells}/uL (ref 200–950)
Basophils Absolute: 99 {cells}/uL (ref 0–200)
Basophils Relative: 1.4 %
Eosinophils Absolute: 114 {cells}/uL (ref 15–500)
Eosinophils Relative: 1.6 %
HCT: 45.7 % — ABNORMAL HIGH (ref 35.0–45.0)
Hemoglobin: 14.8 g/dL (ref 11.7–15.5)
MCH: 28.9 pg (ref 27.0–33.0)
MCHC: 32.4 g/dL (ref 32.0–36.0)
MCV: 89.3 fL (ref 80.0–100.0)
MPV: 10.4 fL (ref 7.5–12.5)
Monocytes Relative: 6.9 %
Neutro Abs: 4544 {cells}/uL (ref 1500–7800)
Neutrophils Relative %: 64 %
Platelets: 215 10*3/uL (ref 140–400)
RBC: 5.12 10*6/uL — ABNORMAL HIGH (ref 3.80–5.10)
RDW: 12.9 % (ref 11.0–15.0)
Total Lymphocyte: 26.1 %
WBC: 7.1 10*3/uL (ref 3.8–10.8)

## 2023-08-12 LAB — COMPLETE METABOLIC PANEL WITH GFR
AG Ratio: 1.8 (calc) (ref 1.0–2.5)
ALT: 13 U/L (ref 6–29)
AST: 16 U/L (ref 10–35)
Albumin: 4.8 g/dL (ref 3.6–5.1)
Alkaline phosphatase (APISO): 69 U/L (ref 37–153)
BUN: 15 mg/dL (ref 7–25)
CO2: 27 mmol/L (ref 20–32)
Calcium: 9.8 mg/dL (ref 8.6–10.4)
Chloride: 104 mmol/L (ref 98–110)
Creat: 0.86 mg/dL (ref 0.60–1.00)
Globulin: 2.6 g/dL (ref 1.9–3.7)
Glucose, Bld: 92 mg/dL (ref 65–99)
Potassium: 4.9 mmol/L (ref 3.5–5.3)
Sodium: 139 mmol/L (ref 135–146)
Total Bilirubin: 0.5 mg/dL (ref 0.2–1.2)
Total Protein: 7.4 g/dL (ref 6.1–8.1)
eGFR: 72 mL/min/{1.73_m2} (ref >=60)

## 2023-08-12 LAB — LIPID PANEL
Cholesterol: 179 mg/dL (ref <200)
HDL: 80 mg/dL (ref >=50)
LDL Cholesterol (Calc): 79 mg/dL
Non-HDL Cholesterol (Calc): 99 mg/dL (ref <130)
Total CHOL/HDL Ratio: 2.2 (calc) (ref <5.0)
Triglycerides: 115 mg/dL (ref <150)

## 2023-08-12 LAB — MAGNESIUM: Magnesium: 2.4 mg/dL (ref 1.5–2.5)

## 2023-08-12 LAB — TSH: TSH: 1.78 m[IU]/L (ref 0.40–4.50)

## 2023-08-12 LAB — HEMOGLOBIN A1C
Hgb A1c MFr Bld: 6.3 %{Hb} — ABNORMAL HIGH (ref <5.7)
Mean Plasma Glucose: 134 mg/dL
eAG (mmol/L): 7.4 mmol/L

## 2023-08-12 LAB — VITAMIN B12: Vitamin B-12: 1589 pg/mL — ABNORMAL HIGH (ref 200–1100)

## 2023-08-12 LAB — MICROALBUMIN / CREATININE URINE RATIO
Creatinine, Urine: 73 mg/dL (ref 20–275)
Microalb, Ur: <0.2 mg/dL

## 2023-08-12 LAB — INSULIN, RANDOM: Insulin: 23.9 u[IU]/mL — ABNORMAL HIGH

## 2023-08-12 LAB — VITAMIN D 25 HYDROXY (VIT D DEFICIENCY, FRACTURES): Vit D, 25-Hydroxy: 33 ng/mL (ref 30–100)

## 2023-10-01 DIAGNOSIS — F0394 Unspecified dementia, unspecified severity, with anxiety: Secondary | ICD-10-CM | POA: Diagnosis not present

## 2023-10-01 DIAGNOSIS — F329 Major depressive disorder, single episode, unspecified: Secondary | ICD-10-CM | POA: Diagnosis not present

## 2023-10-03 DIAGNOSIS — J209 Acute bronchitis, unspecified: Secondary | ICD-10-CM | POA: Diagnosis not present

## 2023-10-03 DIAGNOSIS — J029 Acute pharyngitis, unspecified: Secondary | ICD-10-CM | POA: Diagnosis not present

## 2023-10-03 DIAGNOSIS — R059 Cough, unspecified: Secondary | ICD-10-CM | POA: Diagnosis not present

## 2023-10-16 ENCOUNTER — Encounter: Payer: Self-pay | Admitting: Neurology

## 2023-10-19 NOTE — Telephone Encounter (Signed)
 Can you please find out where the patient was seen and can we get the results.

## 2023-11-02 NOTE — Telephone Encounter (Signed)
 Please add them to the cancellation list to discuss Neuropsych result consistent with dementia.

## 2023-11-04 DIAGNOSIS — F01B4 Vascular dementia, moderate, with anxiety: Secondary | ICD-10-CM | POA: Diagnosis not present

## 2023-11-11 DIAGNOSIS — I10 Essential (primary) hypertension: Secondary | ICD-10-CM | POA: Diagnosis not present

## 2023-11-11 DIAGNOSIS — Z79899 Other long term (current) drug therapy: Secondary | ICD-10-CM | POA: Diagnosis not present

## 2023-11-11 DIAGNOSIS — J449 Chronic obstructive pulmonary disease, unspecified: Secondary | ICD-10-CM | POA: Diagnosis not present

## 2023-11-11 DIAGNOSIS — E559 Vitamin D deficiency, unspecified: Secondary | ICD-10-CM | POA: Diagnosis not present

## 2023-11-11 DIAGNOSIS — M545 Low back pain, unspecified: Secondary | ICD-10-CM | POA: Diagnosis not present

## 2023-11-11 DIAGNOSIS — R4189 Other symptoms and signs involving cognitive functions and awareness: Secondary | ICD-10-CM | POA: Diagnosis not present

## 2023-11-20 ENCOUNTER — Ambulatory Visit: Payer: Medicare HMO | Admitting: Internal Medicine

## 2023-11-27 DIAGNOSIS — R059 Cough, unspecified: Secondary | ICD-10-CM | POA: Diagnosis not present

## 2023-11-28 DIAGNOSIS — E6609 Other obesity due to excess calories: Secondary | ICD-10-CM | POA: Diagnosis not present

## 2023-11-28 DIAGNOSIS — M25512 Pain in left shoulder: Secondary | ICD-10-CM | POA: Diagnosis not present

## 2023-12-23 NOTE — Progress Notes (Unsigned)
 HPI female smoker followed for allergic rhinitis, recurrent acute bronchitis, tobacco use Allergy  Profile 06/16/13 Total IgE 41.7   Pos dust mite, dog, [place] grass, ragwee Office Spirometry 06/13/2015-within normal limits. FVC 3.55/117%, FEV1 2.64/111%, FEV1/FVC 0.74 PFT 10/31/2013-within normal limits with no response to bronchodilator. FVC 3.91/120%, FEV1 3.08/123%, FEV1/FEC 0.79, FEF 25-75% 2.90/129%. TLC 113%, DLCO 86%. 6 minute walk test 10/31/2013-94%, 97%, 97%, 480 m. Normal oxygenation. Office Spirometry 06/13/2015-within normal limits. FVC 3.55/117%, FEV1 2.64/111%, FEV1/FVC 0.74 PFT 09/21/2018- minimal obstruction, insig resp to BD. FVC 3.51/ 113%, FEV1 2.69/ 113%, R .77, TLC 118%, DLCO 96% ----------------------------------------------------------------------------------------------   10/20/22- 73 year old female former (quit 2020) smoker followed for allergic rhinitis, COPD, tobacco use, LUL VATS 09/28/18 for non-SCCa/ Dr Nicanor Barge, CAD, Aortic Atherosclerosis, GERD, DDD-cervical/lumbar, Obesity -Neb albuterol , Ventolin  hfa, Singulair ,   Anoro,          Covid vax-3 Phizer Flu vax-had -----Occ SOB with exertion, overall pt has been doing pretty well She has had a persistent sensation of lump/pressure left parasternal area more or less constant over the last month or so.  Not much affected by her inhalers.  Little cough.  No distinct exacerbations of her breathing.  Does not notice palpitation or obvious angina. She has considerable social stresses related to care for elderly family including Alzheimer's.  There is also court custody battle concerning her grandson. She says she has had no sense of taste or smell since she woke from her left upper lobe surgery in 2020. CXR 10/20/21- IMPRESSION: No active cardiopulmonary disease.  12/23/22- 73 year old female former (quit 2020) smoker followed for allergic rhinitis, COPD, hx tobacco use, LUL VATS 09/28/18 for non-SCCa/ Dr Nicanor Barge, CAD,  Aortic Atherosclerosis, GERD, DDD-cervical/lumbar, Obesity -Neb albuterol , Ventolin  hfa, Singulair ,   Anoro,          She prefers the samples of Trelegy which she tried over previous Anoro.  Currently feeling quite well with no acute problems.  Chest x-ray discussed. CXR 10/22/22- FINDINGS: The heart size and mediastinal contours are within normal limits. There is atherosclerotic calcification of the aorta. Slightly reduced lung volume is noted on the left. No consolidation, effusion, or pneumothorax. Degenerative changes are noted in the thoracic spine. No acute osseous abnormality. IMPRESSION: No active cardiopulmonary disease.  12/24/23- 73 year old female former (quit 2020) smoker followed for allergic rhinitis, COPD, hx tobacco use, LUL VATS 09/28/18 for non-SCCa/ Dr Nicanor Barge, CAD, Aortic Atherosclerosis, GERD, DDD-cervical/lumbar, Obesity -Neb albuterol , Ventolin  hfa, Singulair ,  Trelegy,  Trelegy costing $300 for 3 month supply. Discussed the use of AI scribe software for clinical note transcription with the patient, who gave verbal consent to proceed.  History of Present Illness   Karen Barrera is a 73 year old female, followed for COPD,  who presents with concerns about the cost of her medication, Trelegy.  She has not experienced significant breathing problems since her last visit. The cost of Trelegy has increased to $300 for a three-month supply, leading her to use it sparingly. She is nearing the end of her current supply and is considering alternative medications. She quit smoking in 2020, which has positively impacted her respiratory health.     Assessment and Plan:    Chronic obstructive pulmonary disease (COPD) COPD well-managed, no significant respiratory issues. Intermittent Trelegy use due to cost. Smoke-free since 2020, positively impacting condition. Proposed switch to Stiolto for cost-effectiveness. - Discussed switching from Trelegy to [employer] due to cost.  Stiolto contains two of the three medications in Trelegy. - Prescribed Stiolto for  a trial period with a one-month supply to assess tolerance and effectiveness. - Instructed her to check Stiolto cost at [facility] and report if prohibitive. - Advised on Stiolto dosing: two puffs per day, either both in the morning or one in the morning and one in the evening. - Scheduled follow-up in six months, with option to return earlier if needed.   Hx bronchogenic carcinoma -no recurrence                                         //needs CXR next visit//     ROS-see HPI + = positive Constitutional:   No-   weight loss, night sweats, fevers, chills, fatigue, lassitude. HEENT:   +headaches, difficulty swallowing, tooth/dental problems, sore throat,       sneezing,no- itching, ear ache, +nasal congestion, +post nasal drip,  CV:  No-   chest pain, orthopnea, PND, swelling in lower extremities, anasarca,  dizziness, palpitations Resp: No-   shortness of breath with exertion or at rest.             productive cough, +non-productive cough,  No- coughing up of blood.              No-   change in color of mucus.  No- wheezing Skin: No-   rash or lesions. GI:  No-   heartburn, indigestion, abdominal pain, nausea, vomiting,  GU:  MS:  No-   joint pain or swelling.   Neuro-     nothing unusual Psych:  No- change in mood or affect. No depression or anxiety.  No memory loss.  OBJ- Physical Exam General- Alert, Oriented, Affect-appropriate, Distress- none acute,  + overweight Skin- rash-none, lesions- none, excoriation- none Lymphadenopathy- none Head- atraumatic            Eyes- Gross vision intact, PERRLA, conjunctivae and secretions clear            Ears- Hearing, canals-normal            Nose- Clear, no-Septal dev, mucus, polyps, erosion, perforation             Throat- Mallampati II , mucosa clear , drainage- none, tonsils- atrophic Neck- flexible , trachea midline, no stridor , thyroid  nl, carotid  no bruit Chest - symmetrical excursion , unlabored           Heart/CV- RRR , no murmur , no gallop  , no rub, nl s1 s2                           - JVD- none , edema- none, stasis changes- none, varices- none           Lung- + clear, wheeze -none, cough- none , dullness-none, rub- none           Chest wall-  Abd-  Br/ Gen/ Rectal- Not done, not indicated Extrem- cyanosis- none, clubbing, none, atrophy- none, strength- nl Neuro- grossly intact to observation

## 2023-12-24 ENCOUNTER — Other Ambulatory Visit: Payer: Self-pay | Admitting: Internal Medicine

## 2023-12-24 ENCOUNTER — Encounter: Payer: Self-pay | Admitting: Internal Medicine

## 2023-12-24 ENCOUNTER — Other Ambulatory Visit (HOSPITAL_COMMUNITY): Payer: Self-pay

## 2023-12-24 ENCOUNTER — Ambulatory Visit: Payer: Medicare HMO | Admitting: Internal Medicine

## 2023-12-24 ENCOUNTER — Telehealth: Payer: Self-pay

## 2023-12-24 VITALS — BP 104/64 | HR 67 | Temp 97.7°F | Ht 64.0 in | Wt 182.6 lb

## 2023-12-24 DIAGNOSIS — J449 Chronic obstructive pulmonary disease, unspecified: Secondary | ICD-10-CM | POA: Diagnosis not present

## 2023-12-24 MED ORDER — STIOLTO RESPIMAT 2.5-2.5 MCG/ACT IN AERS: INHALATION_SPRAY | RESPIRATORY_TRACT | 6 refills | Status: DC

## 2023-12-24 NOTE — Patient Instructions (Signed)
 Since price is up on Trelegy for you, I have sent a script to try Stiolto inhaler- 2 puffs daily.

## 2023-12-24 NOTE — Telephone Encounter (Signed)
*  Pulm  Pharmacy Patient Advocate Encounter   Received notification from RX Request Messages that prior authorization for Tiotropium Bromide-Olodaterol (STIOLTO RESPIMAT) 2.5-2.5 MCG/ACT AERS  is required/requested.   Insurance verification completed.   The patient is insured through [facility] .   Per test claim:  Anoro Ellipta , Irineo Manns is preferred by the insurance.  If suggested medication is appropriate, Please send in a new RX and discontinue this one. If not, please advise as to why it's not appropriate so that we may request a Prior Authorization. Please note, some preferred medications may still require a PA.  If the suggested medications have not been trialed and there are no contraindications to their use, the PA will not be submitted, as it will not be approved.

## 2023-12-24 NOTE — Telephone Encounter (Signed)
 Please advise formulary alternatives for Stiolto inhaler. Thank you.

## 2023-12-24 NOTE — Telephone Encounter (Signed)
 Per test claims, preferred alternatives:   Anoro Ellipta - $120.24 Bevespi Aerosphere- $109.32

## 2023-12-25 DIAGNOSIS — M19012 Primary osteoarthritis, left shoulder: Secondary | ICD-10-CM | POA: Diagnosis not present

## 2023-12-30 NOTE — Telephone Encounter (Signed)
 Please advise, Dr. Linder Revere, on which inhaler you prefer for patient. Thanks.

## 2023-12-31 NOTE — Telephone Encounter (Signed)
 Stiolto inhaler replaced with Anoro inhaler per insurance coverage

## 2024-01-13 ENCOUNTER — Ambulatory Visit: Payer: Medicare HMO | Admitting: Internal Medicine

## 2024-01-13 ENCOUNTER — Ambulatory Visit: Payer: Medicare HMO | Admitting: Nurse Practitioner

## 2024-02-09 ENCOUNTER — Telehealth: Payer: Self-pay | Admitting: Neurology

## 2024-02-09 NOTE — Telephone Encounter (Signed)
 request to cancel appointment, pt states not needed at this time

## 2024-02-15 ENCOUNTER — Other Ambulatory Visit: Payer: Self-pay | Admitting: Internal Medicine

## 2024-02-16 ENCOUNTER — Institutional Professional Consult (permissible substitution): Admitting: Neurology

## 2024-02-23 ENCOUNTER — Ambulatory Visit: Payer: Medicare HMO | Admitting: Nurse Practitioner

## 2024-04-14 ENCOUNTER — Other Ambulatory Visit: Payer: Self-pay | Admitting: Internal Medicine

## 2024-06-02 ENCOUNTER — Encounter: Payer: Self-pay | Admitting: Neurology

## 2024-06-02 ENCOUNTER — Ambulatory Visit: Admitting: Neurology

## 2024-06-02 VITALS — BP 126/78 | HR 67 | Ht 65.0 in | Wt 165.5 lb

## 2024-06-02 DIAGNOSIS — G3184 Mild cognitive impairment, so stated: Secondary | ICD-10-CM

## 2024-06-02 MED ORDER — QUETIAPINE FUMARATE 25 MG PO TABS: 25.0000 mg | ORAL_TABLET | Freq: Every day | ORAL | 2 refills | Status: AC

## 2024-06-02 MED ORDER — DONEPEZIL HCL 5 MG PO TABS: 5.0000 mg | ORAL_TABLET | Freq: Every day | ORAL | 2 refills | Status: DC

## 2024-06-02 NOTE — Patient Instructions (Signed)
 Will obtain the Lumipulse which consist of better amyloid 42 ratio and pTau 217.  If positive we will obtain amyloid PET scan  Due to cost, will discontinue Rexulti and switch patient to Seroquel.  Start 12.5 mg nightly and if able to tolerate increase to 25 mg nightly Continue follow with your doctors Return in 1 year or sooner if worse

## 2024-06-02 NOTE — Progress Notes (Signed)
 [facility]  PATIENT: Karen Barrera DOB: [birth year]  REQUESTING CLINICIAN: Laurice President, NP HISTORY FROM: Patient and daughter in law  REASON FOR VISIT: Memory loss    HISTORICAL  CHIEF COMPLAINT:  Chief Complaint  Patient presents with   Follow-up    Pt in room 12. Husband Ron and mother Nichole in room. Here for memory follow up. [place]oca:14   INTERVAL HISTORY 06/02/2024 Discussed the use of AI scribe software for clinical note transcription with the patient, who gave verbal consent to proceed.  Karen Barrera is a 73 year old female who presents for follow up with memory loss and cognitive difficulties.   She experiences significant memory issues, including difficulty recalling recent conversations and appointments. She frequently checks her refrigerator for appointment times and forgets tasks she intended to complete. She describes these memory lapses as 'aggravating' and likens them to 'the sky is falling.'  Her husband notes that since starting Rexulti, she has been easier to get along with, although she still experiences some agitation and frustration. Prior to Rexulti, she was described as 'anxious' and easily agitated. She currently takes one tablet of Rexulti daily, but the cost is a significant burden.  She is able to perform daily activities such as cooking, cleaning, and shopping independently, although she occasionally misplaces items. She does not have difficulty with showering, dressing, or using the bathroom. However, her husband expresses concern about leaving her alone for extended periods, as she may struggle with medication management and other tasks.  Her memory issues began after significant personal losses, including the death of her father and sister, and a custody battle for her great-grandson, which she lost. These events have contributed to her emotional distress and may be impacting her cognitive function.    HISTORY OF  PRESENT ILLNESS:  This is 73 year old woman past medical history of COPD, hyperlipidemia, depression who is presenting with memory loss for the past year, worse in the past 6 months.  She is accompanied by her daughter in law.  They report that patient has difficulty remembering names of children and grandchildren, sometimes she will forget important birthday.  Her main complaint is word finding difficulty, she said that she can see the words but it just cannot come out.  Also she has difficulty putting sentences together.  She does live with her husband, she is independent in all actives of daily living but is undergoing a lot of stress.  Her granddaughter passed away last 10/25/23she is in a custody battle regarding the great grand child, also reports a lot of death in the family, including her father, 2 weeks before christmas.  She is currently being treated with Zoloft .  She has not completed any therapy, grief therapy.   TBI:  No past history of TBI Stroke:   no past history of stroke Seizures:  One seizure in the past  Sleep: no history of sleep apnea.  Has never had sleep study but there is a lot of snoring  Mood: Yes  patient with anxiety and depression, currently on Zoloft   Family history of Dementia: Father with Dementia  Functional status: independent in all ADLs and IADLs Patient lives with husband. Cooking: no issues  Cleaning: no issues  Shopping: no issues  Bathing: no issues  Toileting: no issues  Driving: no issues  Bills: no issues  Medications: no issues  Ever left the stove on by accident?: denies  Forget how to use items around the house?: denies  Getting lost going to familiar places?: denies  Forgetting loved ones names?: yes Word finding difficulty? yes Sleep: Not good    OTHER MEDICAL CONDITIONS: COPD, Hyperlipidemia, Depression   REVIEW OF SYSTEMS: Full 14 system review of systems performed and negative with exception of: As noted in the HPI    ALLERGIES: Allergies  Allergen Reactions   Chantix [Varenicline] Anaphylaxis and Swelling    Makes throat swell up   Moxifloxacin  Hcl In Nacl Other (See Comments)    aches    HOME MEDICATIONS: Outpatient Medications Prior to Visit  Medication Sig Dispense Refill   albuterol  (VENTOLIN  HFA) 108 (90 Base) MCG/ACT inhaler INHALE 2 PUFFS BY MOUTH EVERY 6 HOURS AS NEEDED 26.8 g 3   Cholecalciferol (VITAMIN D -3) 125 MCG (5000 UT) TABS Take 2 caps (5,000) units = 10,000 units /daily 30 tablet 0   diphenhydrAMINE  (BENADRYL ) 25 MG tablet Take 25 mg by mouth as needed.     umeclidinium-vilanterol (ANORO ELLIPTA ) 62.5-25 MCG/ACT AEPB Inhale 1 puff, once daily 6 each 5   REXULTI 0.5 MG TABS Take 1 tablet by mouth daily.     albuterol  (PROVENTIL ) (2.5 MG/3ML) 0.083% nebulizer solution Take 3 mLs (2.5 mg total) by nebulization every 6 (six) hours as needed for wheezing or shortness of breath. (Patient not taking: Reported on 06/02/2024) 225 mL 3   metoprolol  succinate (TOPROL -XL) 25 MG 24 hr tablet TAKE 1 TABLET (25 MG TOTAL) BY MOUTH DAILY. 90 tablet 3   rosuvastatin  (CRESTOR ) 40 MG tablet Take 1 tablet (40 mg total) by mouth daily. (Patient not taking: Reported on 06/02/2024) 90 tablet 3   No facility-administered medications prior to visit.    PAST MEDICAL HISTORY: Past Medical History:  Diagnosis Date   Acute perforated appendicitis 10/31/2014   Adenomatous colon polyp    Allergic rhinitis    Allergy     Anemia    Arthritis    fingers (10/31/2014)   Chronic bronchitis (HCC)    got it q yr til I had my nose OR (10/31/2014)   COPD (chronic obstructive pulmonary disease) (HCC)    Coronary artery disease    DDD (degenerative disc disease), cervical    DDD (degenerative disc disease), lumbar    Depression    short term when my sister passed away unexpectedly   Family history of adverse reaction to anesthesia    mother is hard to wake up; a little goes a long way w/her   Family history  of breast cancer    Family history of colon cancer    Family history of skin cancer    GERD (gastroesophageal reflux disease)    History of stomach ulcers    Hyperlipidemia    borderline; RX is preventative (10/31/2014)   Kidney stones    they passed   Lung cancer (HCC) 09/28/2018   Migraine    used to get them alot; don't get them anymore (10/31/2014)   Osteoarthritis of both knees    Osteopenia    Pneumonia 2003 X 1   Pre-diabetes    Seizures (HCC) ~ 1962 X 1   from an abscessed wisdom tooth   Tobacco dependence    Vitamin D  deficiency     PAST SURGICAL HISTORY: Past Surgical History:  Procedure Laterality Date   APPENDECTOMY     ARTHROTOMY Right 01/11/2016   Procedure: DISTAL INTERPHALANGEAL JOINT ARTHROTOMY RIGHT INDEX FINGER;  Surgeon: Franky Curia, MD;  Location: [facility];  Service: Orthopedics;  Laterality: Right;   BREAST  LUMPECTOMY WITH RADIOACTIVE SEED LOCALIZATION Right 08/01/2020   Procedure: RIGHT BREAST LUMPECTOMY WITH RADIOACTIVE SEED LOCALIZATION;  Surgeon: Vernetta Berg, MD;  Location[facility];  Service: General;  Laterality: Right;  LMA   COLONOSCOPY     CYST EXCISION Right 01/11/2016   Procedure: EXCISION MUCOID CYST;  Surgeon: Franky Curia, MD;  Location: [facility];  Service: Orthopedics;  Laterality: Right;   GANGLION CYST EXCISION Right 02/21/2020   Procedure: REMOVAL GANGLION OF WRIST;  Surgeon: Curia Franky, MD;  Location: [facility];  Service: Orthopedics;  Laterality: Right;   LAPAROSCOPIC APPENDECTOMY N/A 10/31/2014   Procedure: APPENDECTOMY LAPAROSCOPIC;  Surgeon: Alm Angle, MD;  Location: [facility] OR;  Service: General;  Laterality: N/A;   LEFT HEART CATH AND CORONARY ANGIOGRAPHY N/A 09/06/2018   Procedure: LEFT HEART CATH AND CORONARY ANGIOGRAPHY;  Surgeon: Burnard Debby LABOR, MD;  Location: [facility];  Service: Cardiovascular;  Laterality: N/A;   POLYPECTOMY     SALPINGOOPHORECTOMY Right 1999    w/grapefruit-sized polyp   SHOULDER ARTHROSCOPY Right 2003   removal spurs   SHOULDER ARTHROSCOPY W/ ROTATOR CUFF REPAIR Left 2012   SINUS EXPLORATION  12/2013   [place]   TENDON REPAIR Left 10/2009   torn tendon @ elbow   VAGINAL HYSTERECTOMY  1978   VIDEO ASSISTED THORACOSCOPY (VATS)/WEDGE RESECTION Left 09/28/2018   Procedure: LEFT VIDEO ASSISTED THORACOSCOPY (VATS)/Completion Left Upper lobe Lobectomy. Lymph node dissection. Intercostal nerve block.;  Surgeon: Army Dallas NOVAK, MD;  Location: [facility] OR;  Service: Thoracic;  Laterality: Left;   VIDEO BRONCHOSCOPY N/A 09/28/2018   Procedure: VIDEO BRONCHOSCOPY;  Surgeon: Army Dallas NOVAK, MD;  Location: [facility] OR;  Service: Thoracic;  Laterality: N/A;    FAMILY HISTORY: Family History  Problem Relation Age of Onset   Diabetes Mother    Skin cancer Mother 64       unsure what type   Colon polyps Father        has had about 5 every c-scope (every 5 years-ish)   Alzheimer's disease Father    Epilepsy Sister    Stroke Maternal Grandmother    Hyperlipidemia Maternal Grandfather    Arthritis Paternal Grandmother    Melanoma Paternal Grandmother 59   Colon cancer Other        dx 40's/50's   Colon cancer Other 75   Colon cancer Other 75   Cancer Other        type unknown   Stomach cancer Neg Hx    Esophageal cancer Neg Hx    Rectal cancer Neg Hx     SOCIAL HISTORY: Social History   Socioeconomic History   Marital status: Married    Spouse name: Not on file   Number of children: Not on file   Years of education: Not on file   Highest education level: Not on file  Occupational History   Occupation: Retired  Tobacco Use   Smoking status: Every Day    Current packs/day: 0.00    Average packs/day: 1 pack/day for 50.0 years (50.0 ttl pk-yrs)    Types: Cigarettes    Start date: 09/26/1968    Last attempt to quit: 09/27/2018    Years since quitting: 5.6   Smokeless tobacco: Never   Tobacco comments:    Quit 09/27/2018  Vaping Use    Vaping status: Never Used  Substance and Sexual Activity   Alcohol use: No    Alcohol/week: 0.0 standard drinks of alcohol   Drug use: No  Sexual activity: Not Currently    Partners: Male    Birth control/protection: Post-menopausal  Other Topics Concern   Not on file  Social History Narrative   Lives at home with husband.    Has 2 children.    Social Drivers of Corporate Investment Banker Strain: Not on file  Food Insecurity: Not on file  Transportation Needs: Not on file  Physical Activity: Not on file  Stress: Not on file  Social Connections: Not on file  Intimate Partner Violence: Not on file    PHYSICAL EXAM  GENERAL EXAM/CONSTITUTIONAL: Vitals:  Vitals:   06/02/24 1033  BP: 126/78  Pulse: 67  Weight: 165 lb 8 oz (75.1 kg)  Height: 5' 5 (1.651 m)   Body mass index is 27.54 kg/m. Wt Readings from Last 3 Encounters:  06/02/24 165 lb 8 oz (75.1 kg)  12/24/23 182 lb 9.6 oz (82.8 kg)  08/11/23 181 lb 3.2 oz (82.2 kg)   Patient is in no distress; well developed, nourished and groomed; neck is supple  MUSCULOSKELETAL: Gait, strength, tone, movements noted in Neurologic exam below  NEUROLOGIC: MENTAL STATUS:      No data to display         awake, alert, oriented to person, place and time recent and remote memory intact normal attention and concentration language fluent, comprehension intact, naming intact fund of knowledge appropriate     06/02/2024   10:38 AM 08/03/2023   10:34 AM  Montreal Cognitive Assessment   Visuospatial/ Executive (0/5) 2 1  Naming (0/3) 1 2  Attention: Read list of digits (0/2) 2 2  Attention: Read list of letters (0/1) 1 1  Attention: Serial 7 subtraction starting at 100 (0/3) 0 1  Language: Repeat phrase (0/2) 2 1  Language : Fluency (0/1) 1 0  Abstraction (0/2) 1 2  Delayed Recall (0/5) 0 0  Orientation (0/6) 4 4  Total 14 14  Adjusted Score (based on education)  14    CRANIAL NERVE:  2nd, 3rd, 4th, 6th-  visual fields full to confrontation, extraocular muscles intact, no nystagmus 5th - facial sensation symmetric 7th - facial strength symmetric 8th - hearing intact 9th - palate elevates symmetrically, uvula midline 11th - shoulder shrug symmetric 12th - tongue protrusion midline  MOTOR:  normal bulk and tone, full strength in the BUE, BLE  SENSORY:  normal and symmetric to light touch  COORDINATION:  finger-nose-finger, fine finger movements normal  GAIT/STATION:  normal   DIAGNOSTIC DATA (LABS, IMAGING, TESTING) - I reviewed patient records, labs, notes, testing and imaging myself where available.  Lab Results  Component Value Date   WBC 7.1 08/11/2023   HGB 14.8 08/11/2023   HCT 45.7 (H) 08/11/2023   MCV 89.3 08/11/2023   PLT 215 08/11/2023      Component Value Date/Time   NA 139 08/11/2023 1005   K 4.9 08/11/2023 1005   CL 104 08/11/2023 1005   CO2 27 08/11/2023 1005   GLUCOSE 92 08/11/2023 1005   BUN 15 08/11/2023 1005   CREATININE 0.86 08/11/2023 1005   CALCIUM  9.8 08/11/2023 1005   PROT 7.4 08/11/2023 1005   ALBUMIN  3.0 (L) 09/30/2018 0350   AST 16 08/11/2023 1005   ALT 13 08/11/2023 1005   ALKPHOS 46 09/30/2018 0350   BILITOT 0.5 08/11/2023 1005   GFRNONAA 65 12/31/2020 1639   GFRAA 76 12/31/2020 1639   Lab Results  Component Value Date   CHOL 179 08/11/2023   HDL  80 08/11/2023   LDLCALC 79 08/11/2023   TRIG 115 08/11/2023   CHOLHDL 2.2 08/11/2023   Lab Results  Component Value Date   HGBA1C 6.3 (H) 08/11/2023   Lab Results  Component Value Date   VITAMINB12 1,589 (H) 08/11/2023   Lab Results  Component Value Date   TSH 1.78 08/11/2023    MRI Brain 02/17/2023 1. No acute intracranial abnormality or mass. 2. Mild chronic small vessel ischemic disease and cerebral atrophy   NEUROPSYCHOLOGICAL EVALUATION SUMMARY & IMPRESSION: Mrs. Milynn Quirion is a 73 year old, right-handed, Caucasian female, who reported experiencing progressively  worsening cognitive difficulties that have manifested over the past 5 to 6 months. Onset was insidious with no precipitating factors endorsed.  Test results regrettably indicated exceptionally low performance on tasks measuring learning and memory, complex attention, object naming, category fluency, spatial judgment, and practical judgment. However, working memory, simple attention, letter fluency, and constructional praxis were within normal limits. Emotionally, self-report measures reflected a severe level of recent depression and moderate anxiety.  Mrs. Sanderford's performance unfortunately demonstrates significant cognitive challenges across most assessed domains, despite her report of full independence in daily activities. Regarding etiology, her high levels of depression and anxiety are likely contributing factors. However, given the severity and pattern of the cognitive test findings, her ATN profile showing elevated pTau181 concentration, and a strong family history of Alzheimer's disease and undiagnosed cognitive issues (father and mother, respectively), there is concern for a comorbid underlying neurodegenerative process.  REFERRING DIAGNOSIS:  Memory changes  FINAL DIAGNOSES (ICD-10 considerations):  Major Neurocognitive Disorder  Major Depressive Disorder, unspecified  R/O Generalized Anxiety Disorder  RECOMMENDATIONS: Follow-up with neurology.  To further assist in differential diagnosis, could consider one or more of the following:  PET scan with Amyvid.  Obtaining a phosphorylated tau 217 sample.  Spinal tap to assess for amyloid beta and tau proteins in cerebrospinal fluid.  Consider obtaining neuroimaging (e.g., a brain MRI scan).  Consider treatment with cholinesterase inhibitor.  Encourage continued follow-up for treatment of depression and anxiety via medications and counseling.  It is recommended that the patient strictly comply with prescribed medical treatments for  cerebrovascular risk factors (e.g., high cholesterol, high blood pressure, sleep apnea, diabetes)    ASSESSMENT AND PLAN  73 y.o. year old female with history of COPD, hyperlipidemia, depression who is presenting for follow-up for mild cognitive impairment.  She did complete her formal neuropsychological testing, diagnosed with major cognitive impairment.  Her ADLs are still intact.  Again family and patient report emotional stress related to losing her sister her father and losing the custody battle of her great-grandson.  Her ATN profile was negative for Alzheimer's biomarkers but today we will obtain the Lumipulse test which consist of beta amyloid 42 ratio and pTau 217.  I will also start her on Aricept 5 mg nightly and if able to tolerate will increase to 10 mg nightly. When it comes to the Rexulti, she tells me that her cost is $1400, she has not tried any previous antipsychotic.  Due to cost of the drug, will switch patient to Seroquel and continue to monitor her symptoms.  I will see her in a year or sooner if worse.   Mild cognitive impairment Memory loss affecting daily activities. Neuropsychological testing indicates underlying dementia, but no biomarkers for Alzheimer's disease were found. Differential diagnosis includes vascular dementia, Lewy body dementia, and frontotemporal dementia, though these are less likely based on clinical presentation. Current diagnosis is mild cognitive impairment, which may  be a precursor to dementia. Depression and anxiety may contribute to cognitive symptoms. - Ordered blood tests to look for biomarkers of Alzheimer's disease. - Prescribed Aricept 5 mg at night, with potential to increase to 10 mg if tolerated. - Discussed potential side effects of Aricept, including diarrhea, vivid dreams, and dizziness.  Depression and anxiety associated with cognitive impairment Depression and anxiety likely contributing to cognitive impairment. Recent significant life  stressors, including the loss of family members and legal issues, may exacerbate symptoms. Current treatment with Rexulti has improved agitation but is cost-prohibitive. - Discontinued Rexulti due to cost. - Prescribed Seroquel 12.5 mg at night, with potential to increase to 25 mg if tolerated. - Discussed potential side effects of Seroquel, including sleepiness.  Agitation associated with cognitive impairment Agitation previously managed with Rexulti, which has been effective but is cost-prohibitive. Transitioning to Seroquel as an alternative antipsychotic to manage agitation. - Prescribed Seroquel as an alternative to Rexulti for agitation management.    1. Mild cognitive impairment      Patient Instructions  Will obtain the Lumipulse which consist of better amyloid 42 ratio and pTau 217.  If positive we will obtain amyloid PET scan  Due to cost, will discontinue Rexulti and switch patient to Seroquel.  Start 12.5 mg nightly and if able to tolerate increase to 25 mg nightly Continue follow with your doctors Return in 1 year or sooner if worse  Orders Placed This Encounter  Procedures   AD-DETECT ABETA 42/40 AND [id number] EVALUATION, PLASMA   Other/Misc lab test    Meds ordered this encounter  Medications   QUEtiapine (SEROQUEL) 25 MG tablet    Sig: Take 1 tablet (25 mg total) by mouth at bedtime.    Dispense:  30 tablet    Refill:  2   donepezil (ARICEPT) 5 MG tablet    Sig: Take 1 tablet (5 mg total) by mouth at bedtime.    Dispense:  30 tablet    Refill:  2    Return in about 1 year (around 06/02/2025).     Pastor Falling, MD 06/02/2024, 12:38 PM  [facility] [address], [address] [place], [place] [address] [phone]

## 2024-06-03 LAB — SPECIMEN STATUS

## 2024-06-03 LAB — SPECIMEN STATUS REPORT

## 2024-06-03 LAB — LUMIPULSE® PTAU-217/BETA AMYLOID 42 RATIO, PLASMA

## 2024-06-07 LAB — LUMIPULSE® PTAU-217/BETA AMYLOID 42 RATIO, PLASMA

## 2024-06-13 ENCOUNTER — Other Ambulatory Visit: Payer: Self-pay | Admitting: Neurology

## 2024-06-24 NOTE — Progress Notes (Signed)
 HPI female smoker followed for allergic rhinitis, recurrent acute bronchitis, tobacco use Allergy  Profile 06/16/13 Total IgE 41.7   Pos dust mite, dog, [place] grass, ragwee Office Spirometry 06/13/2015-within normal limits. FVC 3.55/117%, FEV1 2.64/111%, FEV1/FVC 0.74 PFT 10/31/2013-within normal limits with no response to bronchodilator. FVC 3.91/120%, FEV1 3.08/123%, FEV1/FEC 0.79, FEF 25-75% 2.90/129%. TLC 113%, DLCO 86%. 6 minute walk test 10/31/2013-94%, 97%, 97%, 480 m. Normal oxygenation. Office Spirometry 06/13/2015-within normal limits. FVC 3.55/117%, FEV1 2.64/111%, FEV1/FVC 0.74 PFT 09/21/2018- minimal obstruction, insig resp to BD. FVC 3.51/ 113%, FEV1 2.69/ 113%, R .77, TLC 118%, DLCO 96% ----------------------------------------------------------------------------------------------   12/24/23- 73 year old female former (quit 2020) smoker followed for allergic rhinitis, COPD, hx tobacco use, LUL VATS 09/28/18 for non-SCCa/ Dr [facility], CAD, Aortic Atherosclerosis, GERD, DDD-cervical/lumbar, Obesity -Neb albuterol , Ventolin  hfa, Singulair ,  Trelegy,  Trelegy costing $300 for 3 month supply. Discussed the use of AI scribe software for clinical note transcription with the patient, who gave verbal consent to proceed.  History of Present Illness   Karen Barrera is a 73 year old female, followed for COPD,  who presents with concerns about the cost of her medication, Trelegy.  She has not experienced significant breathing problems since her last visit. The cost of Trelegy has increased to $300 for a three-month supply, leading her to use it sparingly. She is nearing the end of her current supply and is considering alternative medications. She quit smoking in 2020, which has positively impacted her respiratory health.     Assessment and Plan:    Chronic obstructive pulmonary disease (COPD) COPD well-managed, no significant respiratory issues. Intermittent Trelegy use due to cost.  Smoke-free since 2020, positively impacting condition. Proposed switch to Stiolto for cost-effectiveness. - Discussed switching from Trelegy to [employer] due to cost. Stiolto contains two of the three medications in Trelegy. - Prescribed Stiolto for a trial period with a one-month supply to assess tolerance and effectiveness. - Instructed her to check Stiolto cost at [facility] and report if prohibitive. - Advised on Stiolto dosing: two puffs per day, either both in the morning or one in the morning and one in the evening. - Scheduled follow-up in six months, with option to return earlier if needed.   Hx bronchogenic carcinoma -no recurrence                                         //needs CXR next visit//<<<<<  06/28/24- 73 year old female former (quit 2020) smoker followed for allergic rhinitis, COPD, hx tobacco use, LUL VATS 09/28/18 for non-SCCa/ Dr [employer], CAD, Aortic Atherosclerosis, GERD, DDD-cervical/lumbar, Obesity, Dementia/ Neurology following, -Neb albuterol , Ventolin  hfa, Singulair ,  Anoro Discussed the use of AI scribe software for clinical note transcription with the patient, who gave verbal consent to proceed.  History of Present Illness   Karen Barrera is a 73 year old female with chronic obstructive pulmonary disease (COPD) who presents for medication management.  She uses Anoro Ellipta  for COPD after switching from Stiolto due to cost and is currently out of Anoro and needs a refill. She has not needed her albuterol  rescue inhaler, as she has not had episodes requiring it. She has struggled with insurance-related medication costs. She had a chest x-ray in March 2024. She is up to date on flu and pneumonia vaccinations received at a drugstore.     CXR 10/20/22  IMPRESSION: No active cardiopulmonary disease.  Assessment and Plan:    Chronic obstructive pulmonary disease (COPD) management and medication adjustment COPD managed with Anoro Ellipta . Previous medication,  Stiolto, was cost-prohibitive. Anoro Ellipta  is affordable and effective. Symptoms well-controlled without rescue inhaler. - Refilled Anoro Ellipta  prescription. - Did not refill albuterol  rescue inhaler. - Advised to contact if Anoro Ellipta  becomes unaffordable.  Annual pulmonary follow-up with chest x-ray for COPD surveillance Annual follow-up for COPD surveillance. Last chest x-ray in March 2024. No acute respiratory symptoms. - Ordered chest x-ray for COPD surveillance. - Scheduled follow-up appointment in one year with pulmonologist.     ROS-see HPI + = positive Constitutional:   No-   weight loss, night sweats, fevers, chills, fatigue, lassitude. HEENT:   +headaches, difficulty swallowing, tooth/dental problems, sore throat,       sneezing,no- itching, ear ache, +nasal congestion, +post nasal drip,  CV:  No-   chest pain, orthopnea, PND, swelling in lower extremities, anasarca,  dizziness, palpitations Resp: No-   shortness of breath with exertion or at rest.             productive cough, +non-productive cough,  No- coughing up of blood.              No-   change in color of mucus.  No- wheezing Skin: No-   rash or lesions. GI:  No-   heartburn, indigestion, abdominal pain, nausea, vomiting,  GU:  MS:  No-   joint pain or swelling.   Neuro-     nothing unusual Psych:  No- change in mood or affect. No depression or anxiety.  No memory loss.  OBJ- Physical Exam General- Alert, Oriented, Affect-appropriate, Distress- none acute,  + overweight Skin- rash-none, lesions- none, excoriation- none Lymphadenopathy- none Head- atraumatic            Eyes- Gross vision intact, PERRLA, conjunctivae and secretions clear            Ears- Hearing, canals-normal            Nose- Clear, no-Septal dev, mucus, polyps, erosion, perforation             Throat- Mallampati II , mucosa clear , drainage- none, tonsils- atrophic Neck- flexible , trachea midline, no stridor , thyroid  nl, carotid no  bruit Chest - symmetrical excursion , unlabored           Heart/CV- RRR , no murmur , no gallop  , no rub, nl s1 s2                           - JVD- none , edema- none, stasis changes- none, varices- none           Lung- + clear, wheeze -none, cough- none , dullness-none, rub- none           Chest wall-  Abd-  Br/ Gen/ Rectal- Not done, not indicated Extrem- cyanosis- none, clubbing, none, atrophy- none, strength- nl Neuro- grossly intact to observation

## 2024-06-27 ENCOUNTER — Ambulatory Visit: Admitting: Internal Medicine

## 2024-06-28 ENCOUNTER — Ambulatory Visit

## 2024-06-28 ENCOUNTER — Encounter: Payer: Self-pay | Admitting: Internal Medicine

## 2024-06-28 ENCOUNTER — Ambulatory Visit: Admitting: Internal Medicine

## 2024-06-28 VITALS — BP 132/78 | HR 72 | Temp 97.8°F | Ht 64.0 in | Wt 159.4 lb

## 2024-06-28 DIAGNOSIS — J449 Chronic obstructive pulmonary disease, unspecified: Secondary | ICD-10-CM

## 2024-06-28 MED ORDER — UMECLIDINIUM-VILANTEROL 62.5-25 MCG/ACT IN AEPB: INHALATION_SPRAY | RESPIRATORY_TRACT | 12 refills | Status: AC

## 2024-06-28 NOTE — Patient Instructions (Signed)
 Order- CXR   dx COPD mixed type  Script sent refilling Anoro inhaler

## 2024-07-01 ENCOUNTER — Ambulatory Visit: Payer: Self-pay | Admitting: Internal Medicine

## 2024-07-01 NOTE — Progress Notes (Signed)
 Left detailed message on VM per DPR . Nothing farther needed.

## 2024-07-05 ENCOUNTER — Encounter: Payer: Self-pay | Admitting: Internal Medicine

## 2024-07-19 ENCOUNTER — Other Ambulatory Visit: Payer: Self-pay | Admitting: Nurse Practitioner

## 2024-07-29 ENCOUNTER — Other Ambulatory Visit: Payer: Self-pay | Admitting: Neurology

## 2024-07-29 ENCOUNTER — Ambulatory Visit: Payer: Self-pay | Admitting: Neurology

## 2024-07-29 DIAGNOSIS — G3184 Mild cognitive impairment, so stated: Secondary | ICD-10-CM

## 2024-07-29 NOTE — Progress Notes (Signed)
 Please call and inform that her the labs did not received good specimens and labs needs to be redraw. Please advise patient to stop by the office for lab work, no appointment needed.

## 2024-08-01 ENCOUNTER — Other Ambulatory Visit

## 2024-08-02 NOTE — Telephone Encounter (Signed)
 Scheduled lab appointment with patient for 08/03/24.

## 2024-08-03 ENCOUNTER — Telehealth: Payer: Self-pay | Admitting: Neurology

## 2024-08-03 ENCOUNTER — Other Ambulatory Visit

## 2024-08-03 DIAGNOSIS — G3184 Mild cognitive impairment, so stated: Secondary | ICD-10-CM

## 2024-08-03 NOTE — Telephone Encounter (Signed)
 Appointment details confirmed

## 2024-08-04 ENCOUNTER — Ambulatory Visit: Payer: Medicare HMO | Admitting: Neurology

## 2024-08-04 ENCOUNTER — Encounter: Payer: Self-pay | Admitting: Neurology

## 2024-08-04 VITALS — BP 122/82 | HR 63 | Ht 64.0 in | Wt 153.5 lb

## 2024-08-04 DIAGNOSIS — G3184 Mild cognitive impairment, so stated: Secondary | ICD-10-CM | POA: Diagnosis not present

## 2024-08-04 DIAGNOSIS — Z0289 Encounter for other administrative examinations: Secondary | ICD-10-CM

## 2024-08-04 DIAGNOSIS — F03A18 Unspecified dementia, mild, with other behavioral disturbance: Secondary | ICD-10-CM

## 2024-08-04 NOTE — Progress Notes (Signed)
"   [facility]  PATIENT: Geraldean Walen Slee DOB: 1951/05/05  REQUESTING CLINICIAN: Tonita Fallow, MD HISTORY FROM: Patient and daughter in law  REASON FOR VISIT: Memory loss    HISTORICAL  CHIEF COMPLAINT:  Chief Complaint  Patient presents with   Follow-up    Room 12 With husband Mild cognitive impairment  MOCA completed:    INTERVAL HISTORY 08/04/2024 Patient presents for follow-up, she is accompanied by her husband.  Since last visit in November, husband tells me that memory has been stable.  Her behavior has improved after starting Seroquel .  Currently she is taking Seroquel  50 mg nightly, last increase by PCP. Patient is also compliant with Aricept .   INTERVAL HISTORY 06/02/2024 Discussed the use of AI scribe software for clinical note transcription with the patient, who gave verbal consent to proceed.  Kamauri Kathol Morneault is a 74 year old female who presents for follow up with memory loss and cognitive difficulties.   She experiences significant memory issues, including difficulty recalling recent conversations and appointments. She frequently checks her refrigerator for appointment times and forgets tasks she intended to complete. She describes these memory lapses as 'aggravating' and likens them to 'the sky is falling.'  Her husband notes that since starting Rexulti, she has been easier to get along with, although she still experiences some agitation and frustration. Prior to Rexulti, she was described as 'anxious' and easily agitated. She currently takes one tablet of Rexulti daily, but the cost is a significant burden.  She is able to perform daily activities such as cooking, cleaning, and shopping independently, although she occasionally misplaces items. She does not have difficulty with showering, dressing, or using the bathroom. However, her husband expresses concern about leaving her alone for extended periods, as she may struggle with  medication management and other tasks.  Her memory issues began after significant personal losses, including the death of her father and sister, and a custody battle for her great-grandson, which she lost. These events have contributed to her emotional distress and may be impacting her cognitive function.    HISTORY OF PRESENT ILLNESS:  This is 74 year old woman past medical history of COPD, hyperlipidemia, depression who is presenting with memory loss for the past year, worse in the past 6 months.  She is accompanied by her daughter in law.  They report that patient has difficulty remembering names of children and grandchildren, sometimes she will forget important birthday.  Her main complaint is word finding difficulty, she said that she can see the words but it just cannot come out.  Also she has difficulty putting sentences together.  She does live with her husband, she is independent in all actives of daily living but is undergoing a lot of stress.  Her granddaughter passed away last 10/21/2023she is in a custody battle regarding the great grand child, also reports a lot of death in the family, including her father, 2 weeks before christmas.  She is currently being treated with Zoloft .  She has not completed any therapy, grief therapy.   TBI:  No past history of TBI Stroke:   no past history of stroke Seizures:  One seizure in the past  Sleep: no history of sleep apnea.  Has never had sleep study but there is a lot of snoring  Mood: Yes  patient with anxiety and depression, currently on Zoloft   Family history of Dementia: Father with Dementia  Functional status: independent in all ADLs and IADLs Patient lives with  husband. Cooking: no issues  Cleaning: no issues  Shopping: no issues  Bathing: no issues  Toileting: no issues  Driving: no issues  Bills: no issues  Medications: no issues  Ever left the stove on by accident?: denies  Forget how to use items around the house?: denies   Getting lost going to familiar places?: denies  Forgetting loved ones names?: yes Word finding difficulty? yes Sleep: Not good    OTHER MEDICAL CONDITIONS: COPD, Hyperlipidemia, Depression   REVIEW OF SYSTEMS: Full 14 system review of systems performed and negative with exception of: As noted in the HPI   ALLERGIES: Allergies  Allergen Reactions   Chantix [Varenicline] Anaphylaxis and Swelling    Makes throat swell up   Moxifloxacin  Hcl In Nacl Other (See Comments)    aches    HOME MEDICATIONS: Outpatient Medications Prior to Visit  Medication Sig Dispense Refill   albuterol  (PROVENTIL ) (2.5 MG/3ML) 0.083% nebulizer solution Take 3 mLs (2.5 mg total) by nebulization every 6 (six) hours as needed for wheezing or shortness of breath. 225 mL 3   albuterol  (VENTOLIN  HFA) 108 (90 Base) MCG/ACT inhaler INHALE 2 PUFFS BY MOUTH EVERY 6 HOURS AS NEEDED 26.8 g 3   Cholecalciferol (VITAMIN D -3) 125 MCG (5000 UT) TABS Take 2 caps (5,000) units = 10,000 units /daily 30 tablet 0   donepezil  (ARICEPT ) 5 MG tablet TAKE 1 TABLET BY MOUTH EVERYDAY AT BEDTIME 90 tablet 3   metoprolol  succinate (TOPROL -XL) 25 MG 24 hr tablet TAKE 1 TABLET (25 MG TOTAL) BY MOUTH DAILY. 30 tablet 0   QUEtiapine  (SEROQUEL ) 25 MG tablet Take 1 tablet (25 mg total) by mouth at bedtime. 30 tablet 2   rosuvastatin  (CRESTOR ) 40 MG tablet Take 1 tablet (40 mg total) by mouth daily. 90 tablet 3   umeclidinium-vilanterol (ANORO ELLIPTA ) 62.5-25 MCG/ACT AEPB Inhale 1 puff, once daily 60 each 12   diphenhydrAMINE  (BENADRYL ) 25 MG tablet Take 25 mg by mouth as needed.     No facility-administered medications prior to visit.    PAST MEDICAL HISTORY: Past Medical History:  Diagnosis Date   Acute perforated appendicitis 10/31/2014   Adenomatous colon polyp    Allergic rhinitis    Allergy     Anemia    Arthritis    fingers (10/31/2014)   Chronic bronchitis (HCC)    got it q yr til I had my nose OR (10/31/2014)   COPD  (chronic obstructive pulmonary disease) (HCC)    Coronary artery disease    DDD (degenerative disc disease), cervical    DDD (degenerative disc disease), lumbar    Depression    short term when my sister passed away unexpectedly   Family history of adverse reaction to anesthesia    mother is hard to wake up; a little goes a long way w/her   Family history of breast cancer    Family history of colon cancer    Family history of skin cancer    GERD (gastroesophageal reflux disease)    History of stomach ulcers    Hyperlipidemia    borderline; RX is preventative (10/31/2014)   Kidney stones    they passed   Lung cancer (HCC) 09/28/2018   Migraine    used to get them alot; don't get them anymore (10/31/2014)   Osteoarthritis of both knees    Osteopenia    Pneumonia 2003 X 1   Pre-diabetes    Seizures (HCC) ~ 1962 X 1   from an abscessed wisdom tooth  Tobacco dependence    Vitamin D  deficiency     PAST SURGICAL HISTORY: Past Surgical History:  Procedure Laterality Date   APPENDECTOMY     ARTHROTOMY Right 01/11/2016   Procedure: DISTAL INTERPHALANGEAL JOINT ARTHROTOMY RIGHT INDEX FINGER;  Surgeon: Franky Curia, MD;  Location: [facility];  Service: Orthopedics;  Laterality: Right;   BREAST LUMPECTOMY WITH RADIOACTIVE SEED LOCALIZATION Right 08/01/2020   Procedure: RIGHT BREAST LUMPECTOMY WITH RADIOACTIVE SEED LOCALIZATION;  Surgeon: Vernetta Berg, MD;  Location: [facility] OR;  Service: General;  Laterality: Right;  LMA   COLONOSCOPY     CYST EXCISION Right 01/11/2016   Procedure: EXCISION MUCOID CYST;  Surgeon: Franky Curia, MD;  Location: [facility];  Service: Orthopedics;  Laterality: Right;   GANGLION CYST EXCISION Right 02/21/2020   Procedure: REMOVAL GANGLION OF WRIST;  Surgeon: Curia Franky, MD;  Location: [facility];  Service: Orthopedics;  Laterality: Right;   LAPAROSCOPIC APPENDECTOMY N/A 10/31/2014   Procedure: APPENDECTOMY  LAPAROSCOPIC;  Surgeon: Alm Angle, MD;  Location: [facility] OR;  Service: General;  Laterality: N/A;   LEFT HEART CATH AND CORONARY ANGIOGRAPHY N/A 09/06/2018   Procedure: LEFT HEART CATH AND CORONARY ANGIOGRAPHY;  Surgeon: Burnard Debby LABOR, MD;  Location: [facility];  Service: Cardiovascular;  Laterality: N/A;   POLYPECTOMY     SALPINGOOPHORECTOMY Right 1999   w/grapefruit-sized polyp   SHOULDER ARTHROSCOPY Right 2003   removal spurs   SHOULDER ARTHROSCOPY W/ ROTATOR CUFF REPAIR Left 2012   SINUS EXPLORATION  12/2013   [place]   TENDON REPAIR Left 10/2009   torn tendon @ elbow   VAGINAL HYSTERECTOMY  1978   VIDEO ASSISTED THORACOSCOPY (VATS)/WEDGE RESECTION Left 09/28/2018   Procedure: LEFT VIDEO ASSISTED THORACOSCOPY (VATS)/Completion Left Upper lobe Lobectomy. Lymph node dissection. Intercostal nerve block.;  Surgeon: Army Dallas NOVAK, MD;  Location: [facility] OR;  Service: Thoracic;  Laterality: Left;   VIDEO BRONCHOSCOPY N/A 09/28/2018   Procedure: VIDEO BRONCHOSCOPY;  Surgeon: Army Dallas NOVAK, MD;  Location: [facility] OR;  Service: Thoracic;  Laterality: N/A;    FAMILY HISTORY: Family History  Problem Relation Age of Onset   Diabetes Mother    Skin cancer Mother 37       unsure what type   Colon polyps Father        has had about 5 every c-scope (every 5 years-ish)   Alzheimer's disease Father    Epilepsy Sister    Stroke Maternal Grandmother    Hyperlipidemia Maternal Grandfather    Arthritis Paternal Grandmother    Melanoma Paternal Grandmother 56   Colon cancer Other        dx 40's/50's   Colon cancer Other 75   Colon cancer Other 75   Cancer Other        type unknown   Stomach cancer Neg Hx    Esophageal cancer Neg Hx    Rectal cancer Neg Hx     SOCIAL HISTORY: Social History   Socioeconomic History   Marital status: Married    Spouse name: Not on file   Number of children: Not on file   Years of education: Not on file   Highest education level: Not on file   Occupational History   Occupation: Retired  Tobacco Use   Smoking status: Every Day    Current packs/day: 0.00    Average packs/day: 1 pack/day for 50.0 years (50.0 ttl pk-yrs)    Types: Cigarettes    Start date: 09/26/1968  Last attempt to quit: 09/27/2018    Years since quitting: 5.8   Smokeless tobacco: Never   Tobacco comments:    Quit 09/27/2018  Vaping Use   Vaping status: Never Used  Substance and Sexual Activity   Alcohol use: No    Alcohol/week: 0.0 standard drinks of alcohol   Drug use: No   Sexual activity: Not Currently    Partners: Male    Birth control/protection: Post-menopausal  Other Topics Concern   Not on file  Social History Narrative   Lives at home with husband.    Has 2 children.    Social Drivers of Health   Tobacco Use: High Risk (08/04/2024)   Patient History    Smoking Tobacco Use: Every Day    Smokeless Tobacco Use: Never    Passive Exposure: Not on file  Financial Resource Strain: Not on file  Food Insecurity: Not on file  Transportation Needs: Not on file  Physical Activity: Not on file  Stress: Not on file  Social Connections: Not on file  Intimate Partner Violence: Not on file  Depression (PHQ2-9): Low Risk (08/07/2021)   Depression (PHQ2-9)    PHQ-2 Score: 0  Alcohol Screen: Not on file  Housing: Not on file  Utilities: Not on file  Health Literacy: Not on file    PHYSICAL EXAM  GENERAL EXAM/CONSTITUTIONAL: Vitals:  Vitals:   08/04/24 1103  BP: 122/82  Pulse: 63  SpO2: 95%  Weight: 153 lb 8 oz (69.6 kg)  Height: 5' 4 (1.626 m)   Body mass index is 26.35 kg/m. Wt Readings from Last 3 Encounters:  08/04/24 153 lb 8 oz (69.6 kg)  06/28/24 159 lb 6.4 oz (72.3 kg)  06/02/24 165 lb 8 oz (75.1 kg)   Patient is in no distress; well developed, nourished and groomed; neck is supple  MUSCULOSKELETAL: Gait, strength, tone, movements noted in Neurologic exam below  NEUROLOGIC: MENTAL STATUS:      No data to display          awake, alert, oriented to person, place and time recent and remote memory intact normal attention and concentration language fluent, comprehension intact, naming intact fund of knowledge appropriate     08/04/2024   11:04 AM 06/02/2024   10:38 AM 08/03/2023   10:34 AM  Montreal Cognitive Assessment   Visuospatial/ Executive (0/5) 0 2 1  Naming (0/3) 2 1 2   Attention: Read list of digits (0/2) 2 2 2   Attention: Read list of letters (0/1) 1 1 1   Attention: Serial 7 subtraction starting at 100 (0/3) 3 0 1  Language: Repeat phrase (0/2) 2 2 1   Language : Fluency (0/1) 1 1 0  Abstraction (0/2) 2 1 2   Delayed Recall (0/5) 5 0 0  Orientation (0/6) 3 4 4   Total 21 14 14   Adjusted Score (based on education) 21  14    CRANIAL NERVE:  2nd, 3rd, 4th, 6th- visual fields full to confrontation, extraocular muscles intact, no nystagmus 5th - facial sensation symmetric 7th - facial strength symmetric 8th - hearing intact 9th - palate elevates symmetrically, uvula midline 11th - shoulder shrug symmetric 12th - tongue protrusion midline  MOTOR:  normal bulk and tone, full strength in the BUE, BLE  SENSORY:  normal and symmetric to light touch  COORDINATION:  finger-nose-finger, fine finger movements normal  GAIT/STATION:  normal   DIAGNOSTIC DATA (LABS, IMAGING, TESTING) - I reviewed patient records, labs, notes, testing and imaging myself where available.  Lab  Results  Component Value Date   WBC WILL FOLLOW 06/02/2024   HGB WILL FOLLOW 06/02/2024   HCT WILL FOLLOW 06/02/2024   MCV WILL FOLLOW 06/02/2024   PLT WILL FOLLOW 06/02/2024      Component Value Date/Time   NA 139 08/11/2023 1005   K 4.9 08/11/2023 1005   CL 104 08/11/2023 1005   CO2 27 08/11/2023 1005   GLUCOSE 92 08/11/2023 1005   BUN 15 08/11/2023 1005   CREATININE 0.86 08/11/2023 1005   CALCIUM  9.8 08/11/2023 1005   PROT 7.4 08/11/2023 1005   ALBUMIN  3.0 (L) 09/30/2018 0350   AST 16 08/11/2023 1005    ALT 13 08/11/2023 1005   ALKPHOS 46 09/30/2018 0350   BILITOT 0.5 08/11/2023 1005   GFRNONAA 65 12/31/2020 1639   GFRAA 76 12/31/2020 1639   Lab Results  Component Value Date   CHOL 179 08/11/2023   HDL 80 08/11/2023   LDLCALC 79 08/11/2023   TRIG 115 08/11/2023   CHOLHDL 2.2 08/11/2023   Lab Results  Component Value Date   HGBA1C 6.3 (H) 08/11/2023   Lab Results  Component Value Date   VITAMINB12 1,589 (H) 08/11/2023   Lab Results  Component Value Date   TSH 1.78 08/11/2023    MRI Brain 02/17/2023 1. No acute intracranial abnormality or mass. 2. Mild chronic small vessel ischemic disease and cerebral atrophy   NEUROPSYCHOLOGICAL EVALUATION SUMMARY & IMPRESSION: Mrs. Giselle Brutus is a 74 year old, right-handed, Caucasian female, who reported experiencing progressively worsening cognitive difficulties that have manifested over the past 5 to 6 months. Onset was insidious with no precipitating factors endorsed.  Test results regrettably indicated exceptionally low performance on tasks measuring learning and memory, complex attention, object naming, category fluency, spatial judgment, and practical judgment. However, working memory, simple attention, letter fluency, and constructional praxis were within normal limits. Emotionally, self-report measures reflected a severe level of recent depression and moderate anxiety.  Mrs. Bradner's performance unfortunately demonstrates significant cognitive challenges across most assessed domains, despite her report of full independence in daily activities. Regarding etiology, her high levels of depression and anxiety are likely contributing factors. However, given the severity and pattern of the cognitive test findings, her ATN profile showing elevated [id number] concentration, and a strong family history of Alzheimer's disease and undiagnosed cognitive issues (father and mother, respectively), there is concern for a comorbid underlying  neurodegenerative process.  REFERRING DIAGNOSIS:  Memory changes  FINAL DIAGNOSES (ICD-10 considerations):  Major Neurocognitive Disorder  Major Depressive Disorder, unspecified  R/O Generalized Anxiety Disorder  RECOMMENDATIONS: Follow-up with neurology.  To further assist in differential diagnosis, could consider one or more of the following:  PET scan with Amyvid.  Obtaining a phosphorylated tau 217 sample.  Spinal tap to assess for amyloid beta and tau proteins in cerebrospinal fluid.  Consider obtaining neuroimaging (e.g., a brain MRI scan).  Consider treatment with cholinesterase inhibitor.  Encourage continued follow-up for treatment of depression and anxiety via medications and counseling.  It is recommended that the patient strictly comply with prescribed medical treatments for cerebrovascular risk factors (e.g., high cholesterol, high blood pressure, sleep apnea, diabetes)    ASSESSMENT AND PLAN  74 y.o. year old female with history of COPD, hyperlipidemia, depression who is presenting for follow-up for mild cognitive impairment vs. Mild dementia.  She did complete her formal neuropsychological testing, diagnosed with major cognitive impairment.  Her ADLs are still intact.  She is pending ATN profile.  She is tolerating the Aricept  very well and  no the Seroquel .    Mild cognitive impairment vs. Mild dementia  Memory loss affecting daily activities. Neuropsychological testing indicates underlying dementia, but no biomarkers for Alzheimer's disease were found. Differential diagnosis includes vascular dementia, Lewy body dementia, and frontotemporal dementia, though these are less likely based on clinical presentation. Current diagnosis is mild cognitive impairment, which may be a precursor to dementia. Depression and anxiety may contribute to cognitive symptoms. - Ordered blood tests to look for biomarkers of Alzheimer's disease. - Continue with Aricept  5 mg at night, with  potential to increase to 10 mg if tolerated.  Agitation associated with cognitive impairment Agitation previously managed with Rexulti, which has been effective but is cost-prohibitive. Transitioning to Seroquel  as an alternative antipsychotic to manage agitation. - Continue Seroquel  50 mg nightly   1. Mild cognitive impairment   2. Mild dementia with other behavioral disturbance, unspecified dementia type [facility])      Patient Instructions  Continue current medication including Aricept  5 mg nightly and Seroquel  50 mg nightly Will recheck ATN profile Continue to follow with PCP Return in a year or sooner if worse.  There are well-accepted and sensible ways to reduce risk for Alzheimers disease and other degenerative brain disorders .  Exercise Daily Walk A daily 20 minute walk should be part of your routine. Disease related apathy can be a significant roadblock to exercise and the only way to overcome this is to make it a daily routine and perhaps have a reward at the end (something your loved one loves to eat or drink perhaps) or a personal trainer coming to the home can also be very useful. Most importantly, the patient is much more likely to exercise if the caregiver / spouse does it with him/her. In general a structured, repetitive schedule is best.  General Health: Any diseases which effect your body will effect your brain such as a pneumonia, urinary infection, blood clot, heart attack or stroke. Keep contact with your primary care doctor for regular follow ups.  Sleep. A good nights sleep is healthy for the brain. Seven hours is recommended. If you have insomnia or poor sleep habits we can give you some instructions. If you have sleep apnea wear your mask.  Diet: Eating a heart healthy diet is also a good idea; fish and poultry instead of red meat, nuts (mostly non-peanuts), vegetables, fruits, olive oil or canola oil (instead of butter), minimal salt (use other spices to flavor foods),  whole grain rice, bread, cereal and pasta and wine in moderation.Research is now showing that the MIND diet, which is a combination of The Mediterranean diet and the DASH diet, is beneficial for cognitive processing and longevity. Information about this diet can be found in The MIND Diet, a book by Annitta Feeling, MS, RDN, and online at wildwildscience.es  Finances, Power of [address] and Advance Directives: You should consider putting legal safeguards in place with regard to financial and medical decision making. While the spouse always has power of attorney for medical and financial issues in the absence of any form, you should consider what you want in case the spouse / caregiver is no longer around or capable of making decisions.   No orders of the defined types were placed in this encounter.   No orders of the defined types were placed in this encounter.   Return in about 1 year (around 08/04/2025). Will   Pastor Falling, MD 08/04/2024, 12:52 PM  [facility] [address], [address] [place], [place]  [address] °[phone] ° °"

## 2024-08-04 NOTE — Patient Instructions (Signed)
 Continue current medication including Aricept  5 mg nightly and Seroquel  50 mg nightly Will recheck ATN profile Continue to follow with PCP Return in a year or sooner if worse.  There are well-accepted and sensible ways to reduce risk for Alzheimers disease and other degenerative brain disorders .  Exercise Daily Walk A daily 20 minute walk should be part of your routine. Disease related apathy can be a significant roadblock to exercise and the only way to overcome this is to make it a daily routine and perhaps have a reward at the end (something your loved one loves to eat or drink perhaps) or a personal trainer coming to the home can also be very useful. Most importantly, the patient is much more likely to exercise if the caregiver / spouse does it with him/her. In general a structured, repetitive schedule is best.  General Health: Any diseases which effect your body will effect your brain such as a pneumonia, urinary infection, blood clot, heart attack or stroke. Keep contact with your primary care doctor for regular follow ups.  Sleep. A good nights sleep is healthy for the brain. Seven hours is recommended. If you have insomnia or poor sleep habits we can give you some instructions. If you have sleep apnea wear your mask.  Diet: Eating a heart healthy diet is also a good idea; fish and poultry instead of red meat, nuts (mostly non-peanuts), vegetables, fruits, olive oil or canola oil (instead of butter), minimal salt (use other spices to flavor foods), whole grain rice, bread, cereal and pasta and wine in moderation.Research is now showing that the MIND diet, which is a combination of The Mediterranean diet and the DASH diet, is beneficial for cognitive processing and longevity. Information about this diet can be found in The MIND Diet, a book by Annitta Feeling, MS, RDN, and online at wildwildscience.es  Finances, Power of [address] and Advance Directives: You should  consider putting legal safeguards in place with regard to financial and medical decision making. While the spouse always has power of attorney for medical and financial issues in the absence of any form, you should consider what you want in case the spouse / caregiver is no longer around or capable of making decisions.

## 2024-08-07 LAB — ATN PROFILE
A -- Beta-amyloid 42/40 Ratio: 0.097 — AB
Beta-amyloid 40: 241.9 pg/mL
Beta-amyloid 42: 23.48 pg/mL
N -- NfL, Plasma: 3.26 pg/mL (ref 0.00–6.04)
T -- p-tau181: 2 pg/mL — AB (ref 0.00–0.97)

## 2024-08-08 ENCOUNTER — Ambulatory Visit: Payer: Self-pay | Admitting: Neurology

## 2024-08-08 DIAGNOSIS — G309 Alzheimer's disease, unspecified: Secondary | ICD-10-CM

## 2024-08-09 NOTE — Telephone Encounter (Signed)
 I called pt and relayed results of her lab results to her,  showed the presence of Alzheimer disease pathology.  The memory loss that she is experiencing is likely from Alzheimer disease. No further action is required on these tests at this time. Keep any upcoming appointments or tests and to call us  with any interim questions,  concerns, problems or updates.  She verbalized understanding. Her husband was with her at home.

## 2024-08-09 NOTE — Telephone Encounter (Signed)
-----   Message from Nurse Heather BROCKS, RN sent at 08/09/2024  8:30 AM EST -----  ----- Message ----- From: Ulla Buddy BIRCH, CMA Sent: 08/08/2024  11:37 AM EST To: Heather LITTIE Boring, RN   ----- Message ----- From: Gregg Lek, MD Sent: 08/08/2024   8:34 AM EST To: Buddy BIRCH Ulla, CMA  Please call and advise the patient that the recent labs we checked showed the presence of Alzheimer disease pathology. Please inform patient that the memory loss that she is experiencing is likely  from Alzheimer disease. No further action is required on these tests at this time. Please remind patient to keep any upcoming appointments or tests and to call us  with any interim questions,  concerns, problems or updates. Thanks,   Lek Gregg, MD

## 2024-08-10 ENCOUNTER — Encounter: Payer: Medicare HMO | Admitting: Nurse Practitioner

## 2024-08-11 ENCOUNTER — Other Ambulatory Visit: Payer: Self-pay | Admitting: Nurse Practitioner

## 2024-08-13 ENCOUNTER — Other Ambulatory Visit: Payer: Self-pay | Admitting: Nurse Practitioner

## 2024-08-23 ENCOUNTER — Other Ambulatory Visit: Payer: Self-pay | Admitting: Nurse Practitioner

## 2024-08-25 NOTE — Telephone Encounter (Signed)
 Pt must schedule an overdue followup appt with a New Cardiologist for any more refills. [phone] LAST  attempt Thank You

## 2025-08-08 ENCOUNTER — Ambulatory Visit: Admitting: Neurology
